# Patient Record
Sex: Male | Born: 1945 | Race: Black or African American | Hispanic: No | Smoking: Former smoker
Health system: Southern US, Community
[De-identification: ages and names within clinical notes are randomized; demographics above are authoritative.]

## PROBLEM LIST (undated history)

## (undated) DIAGNOSIS — M1990 Unspecified osteoarthritis, unspecified site: Secondary | ICD-10-CM

## (undated) DIAGNOSIS — I82409 Acute embolism and thrombosis of unspecified deep veins of unspecified lower extremity: Secondary | ICD-10-CM

## (undated) DIAGNOSIS — C9110 Chronic lymphocytic leukemia of B-cell type not having achieved remission: Secondary | ICD-10-CM

## (undated) DIAGNOSIS — I639 Cerebral infarction, unspecified: Secondary | ICD-10-CM

## (undated) DIAGNOSIS — Z85048 Personal history of other malignant neoplasm of rectum, rectosigmoid junction, and anus: Secondary | ICD-10-CM

## (undated) DIAGNOSIS — I10 Essential (primary) hypertension: Secondary | ICD-10-CM

## (undated) HISTORY — DX: Personal history of other malignant neoplasm of rectum, rectosigmoid junction, and anus: Z85.048

## (undated) HISTORY — DX: Acute embolism and thrombosis of unspecified deep veins of unspecified lower extremity: I82.409

## (undated) HISTORY — DX: Cerebral infarction, unspecified: I63.9

## (undated) HISTORY — PX: COLECTOMY: SHX59

## (undated) HISTORY — DX: Essential (primary) hypertension: I10

## (undated) HISTORY — DX: Chronic lymphocytic leukemia of B-cell type not having achieved remission: C91.10

---

## 2021-03-30 ENCOUNTER — Other Ambulatory Visit: Payer: Self-pay | Admitting: Urgent Care

## 2021-03-30 DIAGNOSIS — R600 Localized edema: Secondary | ICD-10-CM

## 2021-03-31 ENCOUNTER — Emergency Department (HOSPITAL_COMMUNITY): Payer: Medicare Other

## 2021-03-31 ENCOUNTER — Observation Stay (HOSPITAL_COMMUNITY): Payer: Medicare Other

## 2021-03-31 ENCOUNTER — Inpatient Hospital Stay (HOSPITAL_COMMUNITY)
Admission: EM | Admit: 2021-03-31 | Discharge: 2021-04-06 | DRG: 071 | Disposition: A | Discharge status: Home Health | Payer: Medicare Other | Attending: Internal Medicine | Admitting: Internal Medicine

## 2021-03-31 ENCOUNTER — Other Ambulatory Visit: Payer: Self-pay

## 2021-03-31 DIAGNOSIS — Z6825 Body mass index (BMI) 25.0-25.9, adult: Secondary | ICD-10-CM

## 2021-03-31 DIAGNOSIS — R4182 Altered mental status, unspecified: Secondary | ICD-10-CM

## 2021-03-31 DIAGNOSIS — I10 Essential (primary) hypertension: Secondary | ICD-10-CM | POA: Diagnosis present

## 2021-03-31 DIAGNOSIS — Z79899 Other long term (current) drug therapy: Secondary | ICD-10-CM

## 2021-03-31 DIAGNOSIS — E876 Hypokalemia: Secondary | ICD-10-CM | POA: Diagnosis present

## 2021-03-31 DIAGNOSIS — R627 Adult failure to thrive: Secondary | ICD-10-CM | POA: Diagnosis present

## 2021-03-31 DIAGNOSIS — Z8249 Family history of ischemic heart disease and other diseases of the circulatory system: Secondary | ICD-10-CM

## 2021-03-31 DIAGNOSIS — Z933 Colostomy status: Secondary | ICD-10-CM

## 2021-03-31 DIAGNOSIS — I69354 Hemiplegia and hemiparesis following cerebral infarction affecting left non-dominant side: Secondary | ICD-10-CM

## 2021-03-31 DIAGNOSIS — Z85048 Personal history of other malignant neoplasm of rectum, rectosigmoid junction, and anus: Secondary | ICD-10-CM

## 2021-03-31 DIAGNOSIS — Z09 Encounter for follow-up examination after completed treatment for conditions other than malignant neoplasm: Secondary | ICD-10-CM

## 2021-03-31 DIAGNOSIS — E162 Hypoglycemia, unspecified: Secondary | ICD-10-CM | POA: Diagnosis not present

## 2021-03-31 DIAGNOSIS — G9341 Metabolic encephalopathy: Principal | ICD-10-CM | POA: Diagnosis present

## 2021-03-31 DIAGNOSIS — E785 Hyperlipidemia, unspecified: Secondary | ICD-10-CM | POA: Diagnosis present

## 2021-03-31 DIAGNOSIS — E669 Obesity, unspecified: Secondary | ICD-10-CM | POA: Diagnosis present

## 2021-03-31 DIAGNOSIS — I161 Hypertensive emergency: Secondary | ICD-10-CM | POA: Diagnosis present

## 2021-03-31 DIAGNOSIS — C9110 Chronic lymphocytic leukemia of B-cell type not having achieved remission: Secondary | ICD-10-CM | POA: Diagnosis present

## 2021-03-31 DIAGNOSIS — F0150 Vascular dementia without behavioral disturbance: Secondary | ICD-10-CM | POA: Diagnosis present

## 2021-03-31 DIAGNOSIS — Z20822 Contact with and (suspected) exposure to covid-19: Secondary | ICD-10-CM | POA: Diagnosis present

## 2021-03-31 LAB — URINALYSIS, ROUTINE W REFLEX MICROSCOPIC
Bacteria, UA: NONE SEEN
Bilirubin Urine: NEGATIVE
Glucose, UA: NEGATIVE mg/dL
Ketones, ur: 5 mg/dL — AB
Leukocytes,Ua: NEGATIVE
Nitrite: NEGATIVE
Protein, ur: NEGATIVE mg/dL
Specific Gravity, Urine: 1.01 (ref 1.005–1.030)
pH: 7 (ref 5.0–8.0)

## 2021-03-31 LAB — COMPREHENSIVE METABOLIC PANEL
ALT: 20 U/L (ref 0–44)
AST: 56 U/L — ABNORMAL HIGH (ref 15–41)
Albumin: 4 g/dL (ref 3.5–5.0)
Alkaline Phosphatase: 54 U/L (ref 38–126)
Anion gap: 10 (ref 5–15)
BUN: 10 mg/dL (ref 8–23)
CO2: 26 mmol/L (ref 22–32)
Calcium: 9.1 mg/dL (ref 8.9–10.3)
Chloride: 102 mmol/L (ref 98–111)
Creatinine, Ser: 0.9 mg/dL (ref 0.61–1.24)
GFR, Estimated: >60 mL/min (ref >60)
Glucose, Bld: 71 mg/dL (ref 70–99)
Potassium: 3.8 mmol/L (ref 3.5–5.1)
Sodium: 138 mmol/L (ref 135–145)
Total Bilirubin: 2.2 mg/dL — ABNORMAL HIGH (ref 0.3–1.2)
Total Protein: 7.5 g/dL (ref 6.5–8.1)

## 2021-03-31 LAB — CBG MONITORING, ED
Glucose-Capillary: 48 mg/dL — ABNORMAL LOW (ref 70–99)
Glucose-Capillary: 76 mg/dL (ref 70–99)
Glucose-Capillary: 73 mg/dL (ref 70–99)
Glucose-Capillary: 72 mg/dL (ref 70–99)

## 2021-03-31 LAB — CBC
HCT: 38.5 % — ABNORMAL LOW (ref 39.0–52.0)
Hemoglobin: 12.7 g/dL — ABNORMAL LOW (ref 13.0–17.0)
MCH: 33.7 pg (ref 26.0–34.0)
MCHC: 33 g/dL (ref 30.0–36.0)
MCV: 102.1 fL — ABNORMAL HIGH (ref 80.0–100.0)
Platelets: 184 10*3/uL (ref 150–400)
RBC: 3.77 MIL/uL — ABNORMAL LOW (ref 4.22–5.81)
RDW: 15.6 % — ABNORMAL HIGH (ref 11.5–15.5)
WBC: 8.2 10*3/uL (ref 4.0–10.5)
nRBC: 0 % (ref 0.0–0.2)

## 2021-03-31 LAB — RESP PANEL BY RT-PCR (FLU A&B, COVID) ARPGX2
Influenza A by PCR: NEGATIVE
Influenza B by PCR: NEGATIVE
SARS Coronavirus 2 by RT PCR: NEGATIVE

## 2021-03-31 LAB — LACTIC ACID, PLASMA
Lactic Acid, Venous: 2 mmol/L (ref 0.5–1.9)
Lactic Acid, Venous: 3.5 mmol/L (ref 0.5–1.9)

## 2021-03-31 MED ORDER — ENALAPRIL MALEATE 10 MG PO TABS
20.0000 mg | ORAL_TABLET | Freq: Two times a day (BID) | ORAL | Status: DC
  Administered 2021-04-01 – 2021-04-06 (×10): 20 mg via ORAL
  Filled 2021-03-31 (×12): qty 2

## 2021-03-31 MED ORDER — LORAZEPAM 2 MG/ML IJ SOLN
0.5000 mg | Freq: Once | INTRAMUSCULAR | Status: AC
  Administered 2021-03-31: 0.5 mg via INTRAVENOUS
  Filled 2021-03-31: qty 1

## 2021-03-31 MED ORDER — LORAZEPAM 2 MG/ML IJ SOLN
1.0000 mg | Freq: Once | INTRAMUSCULAR | Status: AC
  Administered 2021-03-31: 1 mg via INTRAVENOUS
  Filled 2021-03-31: qty 1

## 2021-03-31 MED ORDER — LORAZEPAM 2 MG/ML IJ SOLN
0.5000 mg | Freq: Once | INTRAMUSCULAR | Status: AC
  Administered 2021-03-31: 0.5 mg via INTRAVENOUS

## 2021-03-31 MED ORDER — ACETAMINOPHEN 650 MG RE SUPP: 650.0000 mg | Freq: Four times a day (QID) | RECTAL | Status: DC | PRN

## 2021-03-31 MED ORDER — ONDANSETRON HCL 4 MG/2ML IJ SOLN: 4.0000 mg | Freq: Four times a day (QID) | INTRAMUSCULAR | Status: DC | PRN

## 2021-03-31 MED ORDER — HALOPERIDOL LACTATE 5 MG/ML IJ SOLN
2.0000 mg | Freq: Once | INTRAMUSCULAR | Status: AC
  Administered 2021-03-31: 2 mg via INTRAVENOUS
  Filled 2021-03-31: qty 1

## 2021-03-31 MED ORDER — MORPHINE SULFATE (PF) 2 MG/ML IV SOLN
2.0000 mg | INTRAVENOUS | Status: DC | PRN
Start: 2021-03-31 — End: 2021-04-06

## 2021-03-31 MED ORDER — LORAZEPAM 2 MG/ML IJ SOLN: 1.0000 mg | Freq: Once | INTRAMUSCULAR | Status: DC

## 2021-03-31 MED ORDER — HYDRALAZINE HCL 20 MG/ML IJ SOLN
10.0000 mg | Freq: Once | INTRAMUSCULAR | Status: AC
  Administered 2021-03-31: 10 mg via INTRAVENOUS
  Filled 2021-03-31: qty 1

## 2021-03-31 MED ORDER — LACTATED RINGERS IV BOLUS
1000.0000 mL | Freq: Once | INTRAVENOUS | Status: AC
  Administered 2021-03-31: 1000 mL via INTRAVENOUS

## 2021-03-31 MED ORDER — DEXTROSE 50 % IV SOLN
1.0000 | Freq: Once | INTRAVENOUS | Status: DC
  Filled 2021-03-31: qty 50

## 2021-03-31 MED ORDER — ONDANSETRON HCL 4 MG PO TABS: 4.0000 mg | ORAL_TABLET | Freq: Four times a day (QID) | ORAL | Status: DC | PRN

## 2021-03-31 MED ORDER — ENOXAPARIN SODIUM 40 MG/0.4ML IJ SOSY
40.0000 mg | PREFILLED_SYRINGE | INTRAMUSCULAR | Status: DC
  Administered 2021-03-31 – 2021-04-05 (×6): 40 mg via SUBCUTANEOUS
  Filled 2021-03-31 (×6): qty 0.4

## 2021-03-31 MED ORDER — HALOPERIDOL LACTATE 5 MG/ML IJ SOLN: 2.0000 mg | Freq: Four times a day (QID) | INTRAMUSCULAR | Status: DC | PRN

## 2021-03-31 MED ORDER — DEXTROSE-NACL 5-0.9 % IV SOLN: INTRAVENOUS | Status: DC

## 2021-03-31 MED ORDER — OXYCODONE HCL 5 MG PO TABS: 5.0000 mg | ORAL_TABLET | ORAL | Status: DC | PRN

## 2021-03-31 MED ORDER — PANTOPRAZOLE SODIUM 40 MG PO TBEC
40.0000 mg | DELAYED_RELEASE_TABLET | Freq: Every day | ORAL | Status: DC
  Administered 2021-04-02 – 2021-04-06 (×5): 40 mg via ORAL
  Filled 2021-03-31 (×6): qty 1

## 2021-03-31 MED ORDER — ROSUVASTATIN CALCIUM 10 MG PO TABS
20.0000 mg | ORAL_TABLET | Freq: Every day | ORAL | Status: DC
  Administered 2021-04-01 – 2021-04-05 (×5): 20 mg via ORAL
  Filled 2021-03-31 (×6): qty 2

## 2021-03-31 MED ORDER — LACTATED RINGERS IV BOLUS: 500.0000 mL | Freq: Once | INTRAVENOUS | Status: DC

## 2021-03-31 MED ORDER — ACETAMINOPHEN 325 MG PO TABS
650.0000 mg | ORAL_TABLET | Freq: Four times a day (QID) | ORAL | Status: DC | PRN
Start: 2021-03-31 — End: 2021-04-06
  Administered 2021-04-03: 650 mg via ORAL
  Filled 2021-03-31: qty 2

## 2021-03-31 NOTE — ED Triage Notes (Addendum)
Patient BIBA home, was called by sister d/t AMS and fall this morning. Sister reports no injuries or LOC. EMS reports patient is not on blood thinners. EMS states he has foul-smelling urine. 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 Sister reports he has been altered since this morning. AOx2 currently, patient is usually AOx4. Sister also reports he has not had any stool his colostomy since yesterday.  300 cc NS given   VS: BP 191/118, CBG 101, HR 76, RR 20

## 2021-03-31 NOTE — ED Provider Notes (Signed)
[facility] DEPT Provider Note   CSN: [id number] Arrival date & time: 03/31/21  0946     History Chief Complaint  Patient presents with   Altered Mental Status    Aaron Gonzalez is a 75 y.o. male.  HPI  Patient is a 75 year old male who was brought in by EMS due to altered mental status.  His sister is at bedside and provides patient's history.  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  She states that he has chronic left-sided deficits as well as mild dementia since this occurred.  After he left the hospital he moved to [place] from [place] in May 2022 to live with his family.  He typically ambulates with a walker.  She states about 1 week ago he was still ambulating with a walker but appeared slightly weaker than normal.  She states that yesterday her other sister found him on the floor of their home with items scattered around him as if he had fell to the floor.  He denied a fall when they asked and told her he was tired and was resting on the floor.  His sisters also note that he had a decreased appetite all day yesterday but was eating normally the day prior.  Early this morning his sister woke up in the middle of the night when the alarm went off and she found him trying to wander out of the house without his walker.  He appeared more confused than normal so she then called EMS.  They do note that recently he has had more foul-smelling urine.  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  They state that yesterday he had decreased output in the colostomy as well.  They state he is typically A&O x4.   Level 5 caveat due to altered mental status     No past medical history on file.  There are no problems to display for this patient.   No family history on file.     Home Medications Prior to Admission medications   Medication  Sig Start Date End Date Taking? Authorizing Provider  allopurinol (ZYLOPRIM) 300 MG tablet Take 300 mg by mouth daily.   Yes [provider]  enalapril (VASOTEC) 20 MG tablet Take 20 mg by mouth 2 (two) times daily.   Yes [provider]  ibrutinib (IMBRUVICA) 420 MG TABS Take 420 mg by mouth daily.   Yes [provider]  pantoprazole (PROTONIX) 40 MG tablet Take 40 mg by mouth daily.   Yes [provider]  rosuvastatin (CRESTOR) 20 MG tablet Take 20 mg by mouth at bedtime.   Yes [provider]  sulfamethoxazole-trimethoprim (BACTRIM DS) 800-160 MG tablet Take 1 tablet by mouth every Monday, Wednesday, and Friday.   Yes [provider]  valACYclovir (VALTREX) 500 MG tablet Take 500 mg by mouth every 12 (twelve) hours.   Yes [provider]    Allergies    Patient has no known allergies.  Review of Systems   Review of Systems  Unable to perform ROS: Mental status change   Physical Exam Updated Vital Signs BP (!) 160/107   Pulse 88   Temp 98.1 F (36.7 C) (Oral)   Resp 18   SpO2 94%   Physical Exam Vitals and nursing note reviewed.  Constitutional:      General:  He is in acute distress.     Appearance: Normal appearance. He is normal weight. He is not ill-appearing, toxic-appearing or diaphoretic.     Comments: Patient lying in the recumbent position.  Tearful during my exam.  Does not provide clear answers to questions.  Has difficulty following commands.  HENT:     Head: Normocephalic and atraumatic.     Right Ear: External ear normal.     Left Ear: External ear normal.     Nose: Nose normal.     Mouth/Throat:     Mouth: Mucous membranes are moist.     Pharynx: Oropharynx is clear. No oropharyngeal exudate or posterior oropharyngeal erythema.  Eyes:     Extraocular Movements: Extraocular movements intact.  Cardiovascular:     Rate and Rhythm: Normal rate and regular rhythm.     Pulses: Normal pulses.      Heart sounds: Normal heart sounds. No murmur heard.   No friction rub. No gallop.  Pulmonary:     Effort: Pulmonary effort is normal. No respiratory distress.     Breath sounds: Normal breath sounds. No stridor. No wheezing, rhonchi or rales.  Abdominal:     General: Abdomen is flat.     Palpations: Abdomen is soft.     Tenderness: There is no abdominal tenderness.     Comments: Colostomy in place in the left abdomen.  No surrounding erythema.  Abdomen is flat and soft.  Musculoskeletal:        General: Normal range of motion.     Cervical back: Normal range of motion and neck supple. No tenderness.  Skin:    General: Skin is warm and dry.  Neurological:     Comments: Oriented to self.  Appears to be able to move all 4 extremities.  At times does not provide clear answers to questions.  States he is currently in [place].  Unsure of the president.  Unsure of the year.  Psychiatric:        Mood and Affect: Mood normal.        Behavior: Behavior normal.    ED Results / Procedures / Treatments   Labs (all labs ordered are listed, but only abnormal results are displayed) Labs Reviewed  COMPREHENSIVE METABOLIC PANEL - Abnormal; Notable for the following components:      Result Value   AST 56 (*)    Total Bilirubin 2.2 (*)    All other components within normal limits  CBC - Abnormal; Notable for the following components:   RBC 3.77 (*)    Hemoglobin 12.7 (*)    HCT 38.5 (*)    MCV 102.1 (*)    RDW 15.6 (*)    All other components within normal limits  URINALYSIS, ROUTINE W REFLEX MICROSCOPIC - Abnormal; Notable for the following components:   Color, Urine STRAW (*)    Hgb urine dipstick SMALL (*)    Ketones, ur 5 (*)    All other components within normal limits  LACTIC ACID, PLASMA - Abnormal; Notable for the following components:   Lactic Acid, Venous 2.0 (*)    All other components within normal limits  LACTIC ACID, PLASMA - Abnormal; Notable for the following components:    Lactic Acid, Venous 3.5 (*)    All other components within normal limits  CBG MONITORING, ED - Abnormal; Notable for the following components:   Glucose-Capillary 48 (*)    All other components within normal limits  RESP PANEL BY RT-PCR (FLU A&B,  COVID) ARPGX2  URINE CULTURE  LACTIC ACID, PLASMA  CBG MONITORING, ED  CBG MONITORING, ED  CBG MONITORING, ED   EKG None  Radiology CT Head Wo Contrast  Result Date: 03/31/2021 CLINICAL DATA:  Altered mental status following a fall this morning. EXAM: CT HEAD WITHOUT CONTRAST TECHNIQUE: Contiguous axial images were obtained from the base of the skull through the vertex without intravenous contrast. COMPARISON:  None. FINDINGS: Brain: Mildly enlarged ventricles and cortical sulci. Mild patchy white matter low density in both cerebral hemispheres. No intracranial hemorrhage, mass lesion or CT evidence of acute infarction. Vascular: No hyperdense vessel or unexpected calcification. Skull: Normal. Negative for fracture or focal lesion. Sinuses/Orbits: Completely opacified right maxillary sinus with extensive mucosal thickening in the right ethmoid and frontal sinuses. Unremarkable orbits. Other: None. IMPRESSION: 1. No acute abnormality. 2. Mild diffuse cerebral atrophy. 3. Mild chronic small vessel white matter ischemic changes in both cerebral hemispheres. 4. Extensive chronic right maxillary, ethmoid and frontal sinusitis. Electronically Signed   By: Claudie Revering M.D.   On: 03/31/2021 13:33   DG Chest Portable 1 View  Result Date: 03/31/2021 CLINICAL DATA:  Altered mental status. EXAM: PORTABLE CHEST 1 VIEW COMPARISON:  None. FINDINGS: Numerous leads and wires project over the chest. Mild right hemidiaphragm elevation. Midline trachea. Normal heart size. Mildly prominent transverse aorta. No pleural effusion or pneumothorax. Clear lungs. IMPRESSION: No acute cardiopulmonary disease. Mildly prominent transverse aorta, possibly related AP portable  technique. Consider nonemergent follow-up with PA and lateral radiographs. Electronically Signed   By: Abigail Miyamoto M.D.   On: 03/31/2021 11:49    Procedures Procedures   Medications Ordered in ED Medications  LORazepam (ATIVAN) injection 1 mg (1 mg Intravenous Given 03/31/21 1224)  lactated ringers bolus 1,000 mL (0 mLs Intravenous Stopped 03/31/21 1537)  lactated ringers bolus 1,000 mL (1,000 mLs Intravenous Bolus 03/31/21 1620)  LORazepam (ATIVAN) injection 0.5 mg (0.5 mg Intravenous Given 03/31/21 1651)  LORazepam (ATIVAN) injection 0.5 mg (0.5 mg Intravenous Given 03/31/21 1704)  haloperidol lactate (HALDOL) injection 2 mg (2 mg Intravenous Given 03/31/21 1801)    ED Course  I have reviewed the triage vital signs and the nursing notes.  Pertinent labs & imaging results that were available during my care of the patient were reviewed by me and considered in my medical decision making (see chart for details).  Clinical Course as of 03/31/21 1811  Sat Mar 31, 2021  1248 Found to be hypoglycemic upon arrival.  Given juice and crackers with a repeat CBG of 76. [LJ]  1309 Lactic Acid, Venous(!!): 2.0 [LJ]  1339 Patient reassessed.  Receiving IV fluids.  His sisters are giving him his home medications.  Currently sleeping in bed. [LJ]  1601 Lactic Acid, Venous(!!): 3.5 Repeat lactic obtained after receiving 1 L of IV fluids.  We will give patient an additional liter of fluids.  Pending MRI of the brain.  We will also obtain a CT scan of the abdomen and pelvis. [LJ]  3818 Reviewed patient's medications with his sisters in length.  They are unsure why he is on Valtrex or Bactrim.  He was started on both these medications after his most recent admission at [facility].  Discussed with receptionist who will attempt to obtain his medical records. [LJ]  2993 Pt awake and not complaint with MRI. Given additional ativan. [LJ]  1723 Pt still not sitting still with additional ativan. Will attempt  CT abd/pelvis at this time.  [LJ]  1746  Pt discussed with the medicine team. They would prefer patient have his scans completed before they accept him for admission. Requested we check an additional lactic acid.  [LJ]    Clinical Course User Index [LJ] Rayna Sexton, PA-C   MDM Rules/Calculators/A&P                          Pt is a 75 y.o. male who presents to the emergency department due to altered mental status.  Labs: CBC with a hemoglobin of 12.7, MCV of 102.1, RDW of 15.6. CMP within AST of 56 and a total bilirubin of 2.2. Respiratory panel is negative. UA showing small hemoglobin and 5 ketones. Elevated lactic acid of 2 with a repeat of 3.5.  Repeat lactic acid was obtained after 1 L of IV fluids.  Patient started on a second liter of IV fluids. Patient initially hypoglycemic at 48.  3 repeat CBG's are all within normal limits.  Imaging: CT scan of the head without contrast shows no acute abnormality.  There is mild diffuse cerebral atrophy.  Mild chronic small vessel white matter ischemic changes in both cerebral hemispheres.  Extensive chronic right maxillary, ethmoid, and frontal sinusitis. Chest x-ray shows no acute cardiopulmonary disease.  Mildly prominent transverse aorta. MRI of the brain and CT scan of the abdomen and pelvis without contrast is pending.  I, Rayna Sexton, PA-C, personally reviewed and evaluated these images and lab results as part of my medical decision-making.  Unsure of the source of patient's altered mental status.  Lab work is mostly reassuring but patient did have a mildly elevated lactic acid at 2.  After a 1 L bolus a repeat lactic acid was obtained and was elevated once again at 3.5.  Additional fluids were started and patient given additional Ativan while in MRI but would not sit still for MRI at this time.    Discussed with the medicine team and they requested that CT scan of the abdomen as well as MRI of the brain be completed before  admission.  Will give patient additional Haldol prior to obtaining MRI.  It is in my shift and patient care is being transferred to [facility].  Patient pending CT scan of the abdomen and pelvis as well as MRI of the brain.  We will obtain a third lactic acid at the request of the medicine team.  We will give additional IV fluids.  Patient will likely require admission for AMS and hypoglycemia after his imaging is obtained.   Note: Portions of this report may have been transcribed using voice recognition software. Every effort was made to ensure accuracy; however, inadvertent computerized transcription errors may be present.   Final Clinical Impression(s) / ED Diagnoses Final diagnoses:  Altered mental status, unspecified altered mental status type  Hypoglycemia   Rx / DC Orders ED Discharge Orders     None        Rayna Sexton, PA-C 03/31/21 1812    Daleen Bo, MD 04/01/21 1526

## 2021-03-31 NOTE — ED Provider Notes (Addendum)
  Face-to-face evaluation   History: Patient presents for evaluation of altered mental status and sleepiness for the last 36 hours.  He recently moved to [facility] to be with his sisters to recover from stroke.  He is walking with a walker.  He has had some periods of time in the last 36 hours where he is more sleepy than usual, sometimes more and sometimes less active than usual.  Family members are concerned about the acute confusion.  He did not eat today.  They called EMS to bring him here because of the persistent difficulty.  Physical exam: Elderly patient.  He is sedated, after receiving Ativan.  Abdomen is soft without mass or signs of tenderness.  No respiratory distress  Medical screening examination/treatment/procedure(s) were conducted as a shared visit with non-physician practitioner(s) and myself.  I personally evaluated the patient during the encounter      Daleen Bo, MD 04/01/21 1525

## 2021-03-31 NOTE — ED Notes (Signed)
Patient given orange juice due to low blood sugar.

## 2021-03-31 NOTE — H&P (Signed)
TRH H&P    Patient Demographics:    Aaron Gonzalez, is a 75 y.o. male  MRN: [id number]  DOB - 01-23-1946  Admit Date - 03/31/2021  Referring MD/NP/PA: Merrily Pew  Outpatient Primary MD for the patient is Pcp, No  Patient coming from: Home  Chief complaint- altered mental status   HPI:    Aaron Gonzalez  is a 75 y.o. male, with history of cancer, hypertension, hyperlipidemia, dementia, presents to the hospital with a chief complaint of altered mental status.  Patient was quite agitated and had to be sedated for MRI, so at this time he is not able to provide any history at all.  His niece and his sister are at bedside.  They report that he possibly had a fall yesterday.  He denied having a fall, but in his room furniture was turned over and awry, so they assume he did have a fall.  Later that day patient refused to take a shower or get dressed.  This is abnormal for him as he is usually independent with his ADLs.  His sister had to change his close for him.  They report that he became more altered throughout the day yesterday, and today he wandered out of the home on his own.  He was wearing soiled clothes, and without his walker which which she always uses for ambulation.  They are able to get him back and he was very confused, this is said to call EMS.  The sister at bedside is not the sister he lives with, so history is limited.  She does report that he has had a decreased appetite for the past couple of days.  The niece at bedside last saw him on the seventh and he was at his baseline.  They deny any cough nausea or vomiting.  He wears a colostomy bag and they report no output in the bag today.  He has not complained of anything else to them.  Of note medical records are limited as well as patient was previously in [place] and only recently moved to [place] after having a stroke with residual left-sided weakness, and being  diagnosed with dementia.  He moved here to live close to family.  In the ED Temp 98.1, heart rate 78-95, respiratory rate 15, blood pressure 204/118, satting at 98% UA is not indicative of UTI Urine culture pending Respiratory panel negative No leukocytosis with a white blood cell count of 8.2, hemoglobin 12.7 Chemistry panel reveals a glucose of 71, T bili of 2.2 Lactic acid uptrending from 2.0-3.5, 2 L bolus given, repeat lactic pending EKG shows a heart rate of 85, sinus rhythm, QTC 433 Chest x-ray shows nothing acute with nonemergent PA and lateral exam recommended for follow-up of a nonspecific finding CT head shows no acute abnormality, mild diffuse cerebral atrophy, chronic ischemic changes CT abdomen pelvis shows no gross acute abnormality MRI brain shows motion artifact, remote right basal ganglia hemorrhagic infarct Admission requested for further work-up of acute metabolic encephalopathy    Review of systems:  unfortunately review of systems cannot be obtained secondary to patient's altered mental status    Past History of the following :      Has history of hypertension, hyperlipidemia, dementia, CVA in March 2022    Social History:      Social History   Tobacco Use   Smoking status: Not on file   Smokeless tobacco: Not on file  Substance Use Topics   Alcohol use: Not on file       Family History :     Family history hypertension   Home Medications:   Prior to Admission medications   Medication Sig Start Date End Date Taking? Authorizing Provider  allopurinol (ZYLOPRIM) 300 MG tablet Take 300 mg by mouth daily.   Yes [provider]  enalapril (VASOTEC) 20 MG tablet Take 20 mg by mouth 2 (two) times daily.   Yes [provider]  ibrutinib (IMBRUVICA) 420 MG TABS Take 420 mg by mouth daily.   Yes [provider]  pantoprazole (PROTONIX) 40 MG tablet Take 40 mg by mouth daily.   Yes [provider]  rosuvastatin  (CRESTOR) 20 MG tablet Take 20 mg by mouth at bedtime.   Yes [provider]  sulfamethoxazole-trimethoprim (BACTRIM DS) 800-160 MG tablet Take 1 tablet by mouth every Monday, Wednesday, and Friday.   Yes [provider]  valACYclovir (VALTREX) 500 MG tablet Take 500 mg by mouth every 12 (twelve) hours.   Yes [provider]     Allergies:    No Known Allergies   Physical Exam:   Vitals  Blood pressure (!) 159/103, pulse 85, temperature 98.1 F (36.7 C), temperature source Oral, resp. rate 18, SpO2 97 %.  1.  General: Patient lying supine in bed,  no acute distress   2. Psychiatric: Sedated and unable to participate in psych assessment   3. Neurologic: Sedated and not following commands, face is symmetric, airway is protected, purposeful movements of both upper extremities, withdraws from pain in both lower extremities   4. HEENMT:  Head is atraumatic, normocephalic, pupils reactive to light, neck is supple, trachea is midline, mucous membranes are moist   5. Respiratory : Lungs are clear to auscultation bilaterally without wheezing, rhonchi, rales, no cyanosis, no increase in work of breathing or accessory muscle use   6. Cardiovascular : Heart rate normal, rhythm is regular, no murmurs, rubs or gallops, unilateral chronic left lower extremity edema-trace, peripheral pulses palpated   7. Gastrointestinal:  Abdomen is soft, nondistended, nontender to palpation bowel sounds active, no masses or organomegaly palpated, colostomy bag is filled with gas   8. Skin:  Skin is warm, dry and intact without rashes, acute lesions, or ulcers on limited exam   9.Musculoskeletal:  No acute deformities or trauma, no asymmetry in tone, trace peripheral edema no lower extremity left, peripheral pulses palpated, no tenderness to palpation in the extremities     Data Review:    CBC Recent Labs  Lab 03/31/21 1049  WBC 8.2  HGB 12.7*  HCT 38.5*  PLT 184   MCV 102.1*  MCH 33.7  MCHC 33.0  RDW 15.6*   ------------------------------------------------------------------------------------------------------------------  Results for orders placed or performed during the hospital encounter of 03/31/21 (from the past 48 hour(s))  Comprehensive metabolic panel     Status: Abnormal   Collection Time: 03/31/21 10:49 AM  Result Value Ref Range   Sodium 138 135 - 145 mmol/L   Potassium 3.8 3.5 - 5.1 mmol/L   Chloride  102 98 - 111 mmol/L   CO2 26 22 - 32 mmol/L   Glucose, Bld 71 70 - 99 mg/dL    Comment: Glucose reference range applies only to samples taken after fasting for at least 8 hours.   BUN 10 8 - 23 mg/dL   Creatinine, Ser 0.90 0.61 - 1.24 mg/dL   Calcium 9.1 8.9 - 10.3 mg/dL   Total Protein 7.5 6.5 - 8.1 g/dL   Albumin 4.0 3.5 - 5.0 g/dL   AST 56 (H) 15 - 41 U/L   ALT 20 0 - 44 U/L   Alkaline Phosphatase 54 38 - 126 U/L   Total Bilirubin 2.2 (H) 0.3 - 1.2 mg/dL   GFR, Estimated >60 >60 mL/min    Comment: (NOTE) Calculated using the CKD-EPI Creatinine Equation (2021)    Anion gap 10 5 - 15    Comment: Performed at [facility], [place] [address] [place], [place] [address]  CBC     Status: Abnormal   Collection Time: 03/31/21 10:49 AM  Result Value Ref Range   WBC 8.2 4.0 - 10.5 K/uL   RBC 3.77 (L) 4.22 - 5.81 MIL/uL   Hemoglobin 12.7 (L) 13.0 - 17.0 g/dL   HCT 38.5 (L) 39.0 - 52.0 %   MCV 102.1 (H) 80.0 - 100.0 fL   MCH 33.7 26.0 - 34.0 pg   MCHC 33.0 30.0 - 36.0 g/dL   RDW 15.6 (H) 11.5 - 15.5 %   Platelets 184 150 - 400 K/uL   nRBC 0.0 0.0 - 0.2 %    Comment: Performed at [facility], [place] [address] [place], [place] [address]  Resp Panel by RT-PCR (Flu A&B, Covid) Nasopharyngeal Swab     Status: None   Collection Time: 03/31/21 10:50 AM   Specimen: Nasopharyngeal Swab; Nasopharyngeal(NP) swabs in vial transport medium  Result Value Ref Range   SARS Coronavirus 2 by RT PCR NEGATIVE  NEGATIVE    Comment: (NOTE) SARS-CoV-2 target nucleic acids are NOT DETECTED.  The SARS-CoV-2 RNA is generally detectable in upper respiratory specimens during the acute phase of infection. The lowest concentration of SARS-CoV-2 viral copies this assay can detect is 138 copies/mL. A negative result does not preclude SARS-Cov-2 infection and should not be used as the sole basis for treatment or other patient management decisions. A negative result may occur with  improper specimen collection/handling, submission of specimen other than nasopharyngeal swab, presence of viral mutation(s) within the areas targeted by this assay, and inadequate number of viral copies(<138 copies/mL). A negative result must be combined with clinical observations, patient history, and epidemiological information. The expected result is Negative.  Fact Sheet for Patients:  EntrepreneurPulse.com.au  Fact Sheet for Healthcare Providers:  IncredibleEmployment.be  This test is no t yet approved or cleared by the [place] FDA and  has been authorized for detection and/or diagnosis of SARS-CoV-2 by FDA under an Emergency Use Authorization (EUA). This EUA will remain  in effect (meaning this test can be used) for the duration of the COVID-19 declaration under Section 564(b)(1) of the Act, 21 U.S.C.section 360bbb-3(b)(1), unless the authorization is terminated  or revoked sooner.       Influenza A by PCR NEGATIVE NEGATIVE   Influenza B by PCR NEGATIVE NEGATIVE    Comment: (NOTE) The Xpert Xpress SARS-CoV-2/FLU/RSV plus assay is intended as an aid in the diagnosis of influenza from Nasopharyngeal swab specimens and should not be used as a sole basis for treatment.  Nasal washings and aspirates are unacceptable for Xpert Xpress SARS-CoV-2/FLU/RSV testing.  Fact Sheet for Patients: EntrepreneurPulse.com.au  Fact Sheet for Healthcare  Providers: IncredibleEmployment.be  This test is not yet approved or cleared by the [place] FDA and has been authorized for detection and/or diagnosis of SARS-CoV-2 by FDA under an Emergency Use Authorization (EUA). This EUA will remain in effect (meaning this test can be used) for the duration of the COVID-19 declaration under Section 564(b)(1) of the Act, 21 U.S.C. section 360bbb-3(b)(1), unless the authorization is terminated or revoked.  Performed at [facility], [place] [address], [place], [place] [address]   Urinalysis, Routine w reflex microscopic Urine, Clean Catch     Status: Abnormal   Collection Time: 03/31/21 10:51 AM  Result Value Ref Range   Color, Urine STRAW (A) YELLOW   APPearance CLEAR CLEAR   Specific Gravity, Urine 1.010 1.005 - 1.030   pH 7.0 5.0 - 8.0   Glucose, UA NEGATIVE NEGATIVE mg/dL   Hgb urine dipstick SMALL (A) NEGATIVE   Bilirubin Urine NEGATIVE NEGATIVE   Ketones, ur 5 (A) NEGATIVE mg/dL   Protein, ur NEGATIVE NEGATIVE mg/dL   Nitrite NEGATIVE NEGATIVE   Leukocytes,Ua NEGATIVE NEGATIVE   RBC / HPF 0-5 0 - 5 RBC/hpf   WBC, UA 0-5 0 - 5 WBC/hpf   Bacteria, UA NONE SEEN NONE SEEN   Squamous Epithelial / LPF 0-5 0 - 5    Comment: Performed at [facility], [place] [address], [place], [place] [address]  Lactic acid, plasma     Status: Abnormal   Collection Time: 03/31/21 10:51 AM  Result Value Ref Range   Lactic Acid, Venous 2.0 (HH) 0.5 - 1.9 mmol/L    Comment: CRITICAL RESULT CALLED TO, READ BACK BY AND VERIFIED WITHEsmond Camper RN AT 1308 03/31/21 MULLINS,T Performed at [facility], [place] [address] [place], [place] [address]   CBG monitoring, ED     Status: Abnormal   Collection Time: 03/31/21 11:53 AM  Result Value Ref Range   Glucose-Capillary 48 (L) 70 - 99 mg/dL    Comment: Glucose reference range applies only to samples taken after fasting for at least 8 hours.   CBG monitoring, ED     Status: None   Collection Time: 03/31/21 12:33 PM  Result Value Ref Range   Glucose-Capillary 76 70 - 99 mg/dL    Comment: Glucose reference range applies only to samples taken after fasting for at least 8 hours.  Lactic acid, plasma     Status: Abnormal   Collection Time: 03/31/21  2:37 PM  Result Value Ref Range   Lactic Acid, Venous 3.5 (HH) 0.5 - 1.9 mmol/L    Comment: CRITICAL VALUE NOTED.  VALUE IS CONSISTENT WITH PREVIOUSLY REPORTED AND CALLED VALUE. Performed at [facility], [place] [address], [place], [place] [address]   POC CBG, ED     Status: None   Collection Time: 03/31/21  2:57 PM  Result Value Ref Range   Glucose-Capillary 73 70 - 99 mg/dL    Comment: Glucose reference range applies only to samples taken after fasting for at least 8 hours.  POC CBG, ED     Status: None   Collection Time: 03/31/21  4:19 PM  Result Value Ref Range   Glucose-Capillary 72 70 - 99 mg/dL    Comment: Glucose reference range applies only to samples taken after fasting for at least 8 hours.    Chemistries  Recent Labs  Lab 03/31/21 1049  NA 138  K 3.8  CL 102  CO2 26  GLUCOSE 71  BUN 10  CREATININE 0.90  CALCIUM 9.1  AST 56*  ALT 20  ALKPHOS 54  BILITOT 2.2*   ------------------------------------------------------------------------------------------------------------------  ------------------------------------------------------------------------------------------------------------------ GFR: CrCl cannot be calculated (Unknown ideal weight.). Liver Function Tests: Recent Labs  Lab 03/31/21 1049  AST 56*  ALT 20  ALKPHOS 54  BILITOT 2.2*  PROT 7.5  ALBUMIN 4.0   No results for input(s): LIPASE, AMYLASE in the last 168 hours. No results for input(s): AMMONIA in the last 168 hours. Coagulation Profile: No results for input(s): INR, PROTIME in the last 168 hours. Cardiac Enzymes: No results for input(s): CKTOTAL, CKMB, CKMBINDEX,  TROPONINI in the last 168 hours. BNP (last 3 results) No results for input(s): PROBNP in the last 8760 hours. HbA1C: No results for input(s): HGBA1C in the last 72 hours. CBG: Recent Labs  Lab 03/31/21 1153 03/31/21 1233 03/31/21 1457 03/31/21 1619  GLUCAP 48* 76 73 72   Lipid Profile: No results for input(s): CHOL, HDL, LDLCALC, TRIG, CHOLHDL, LDLDIRECT in the last 72 hours. Thyroid Function Tests: No results for input(s): TSH, T4TOTAL, FREET4, T3FREE, THYROIDAB in the last 72 hours. Anemia Panel: No results for input(s): VITAMINB12, FOLATE, FERRITIN, TIBC, IRON, RETICCTPCT in the last 72 hours.  --------------------------------------------------------------------------------------------------------------- Urine analysis:    Component Value Date/Time   COLORURINE STRAW (A) 03/31/2021 1051   APPEARANCEUR CLEAR 03/31/2021 1051   LABSPEC 1.010 03/31/2021 1051   PHURINE 7.0 03/31/2021 1051   GLUCOSEU NEGATIVE 03/31/2021 1051   HGBUR SMALL (A) 03/31/2021 1051   BILIRUBINUR NEGATIVE 03/31/2021 1051   KETONESUR 5 (A) 03/31/2021 1051   PROTEINUR NEGATIVE 03/31/2021 1051   NITRITE NEGATIVE 03/31/2021 1051   LEUKOCYTESUR NEGATIVE 03/31/2021 1051      Imaging Results:    CT ABDOMEN PELVIS WO CONTRAST  Result Date: 03/31/2021 CLINICAL DATA:  Acute, non localized abdominal pain. EXAM: CT ABDOMEN AND PELVIS WITHOUT CONTRAST TECHNIQUE: Multidetector CT imaging of the abdomen and pelvis was performed following the standard protocol without IV contrast. COMPARISON:  None. FINDINGS: Lower chest: Mildly enlarged heart. Minimal bibasilar atelectasis/scarring. Hepatobiliary: Normal appearing liver. Poorly distended gallbladder. Pancreas: Unremarkable. No pancreatic ductal dilatation or surrounding inflammatory changes. Spleen: Normal in size without focal abnormality. Adrenals/Urinary Tract: The adrenal glands are not well visualized due to streak artifacts produced by the patient's arms  cysts. No gross adrenal abnormalities are seen. Multiple probable bilateral renal cysts, difficult to assess due to the streak artifacts. Tiny lower pole right renal calculus. Tiny upper pole left renal calculus. No bladder or ureteral calculi seen. Portions of the bladder and distal ureters are obscured by streak artifacts from a right prosthesis. Stomach/Bowel: Small hiatal hernia. Single proximal sigmoid colon diverticulum. No visible small bowel abnormalities. No findings suspicious for appendicitis. Vascular/Lymphatic: No significant vascular findings are present. No enlarged abdominal or pelvic lymph nodes. Reproductive: Probable moderately enlarged prostate gland, poorly visualized due to streak artifacts. Other: Bilateral hydroceles.  Left lower quadrant ostomy. Musculoskeletal: Right hip prosthesis with associated streak artifacts. Marked left hip degenerative changes. Moderate thoracolumbar scoliosis and extensive degenerative changes. IMPRESSION: 1. Limited examination with no gross acute abnormality. 2. Tiny bilateral, nonobstructing renal calculi. 3. Small hiatal hernia. Electronically Signed   By: Claudie Revering M.D.   On: 03/31/2021 18:15   CT Head Wo Contrast  Result Date: 03/31/2021 CLINICAL DATA:  Altered mental status following a fall this morning. EXAM: CT  HEAD WITHOUT CONTRAST TECHNIQUE: Contiguous axial images were obtained from the base of the skull through the vertex without intravenous contrast. COMPARISON:  None. FINDINGS: Brain: Mildly enlarged ventricles and cortical sulci. Mild patchy white matter low density in both cerebral hemispheres. No intracranial hemorrhage, mass lesion or CT evidence of acute infarction. Vascular: No hyperdense vessel or unexpected calcification. Skull: Normal. Negative for fracture or focal lesion. Sinuses/Orbits: Completely opacified right maxillary sinus with extensive mucosal thickening in the right ethmoid and frontal sinuses. Unremarkable orbits. Other:  None. IMPRESSION: 1. No acute abnormality. 2. Mild diffuse cerebral atrophy. 3. Mild chronic small vessel white matter ischemic changes in both cerebral hemispheres. 4. Extensive chronic right maxillary, ethmoid and frontal sinusitis. Electronically Signed   By: Claudie Revering M.D.   On: 03/31/2021 13:33   MR BRAIN WO CONTRAST  Result Date: 03/31/2021 CLINICAL DATA:  Initial evaluation for delirium. EXAM: MRI HEAD WITHOUT CONTRAST TECHNIQUE: Multiplanar, multiecho pulse sequences of the brain and surrounding structures were obtained without intravenous contrast. COMPARISON:  Prior CT from earlier the same day. FINDINGS: Brain: Examination technically limited by extensive motion artifact, markedly degrading multiple sequences. Generalized age-related cerebral atrophy. Scattered patchy T2/FLAIR hyperintensity within the periventricular deep white matter both cerebral hemispheres as well as the pons, most consistent with chronic small vessel ischemic disease, mild to moderate in nature. Remote right basal ganglia infarct with prominent susceptibility artifact, consistent with a chronic hemorrhagic infarct (series 26, image 33). No acute hemorrhage seen at this location on prior CT. No abnormal foci of restricted diffusion to suggest acute or subacute ischemia. Gray-white matter differentiation maintained. No encephalomalacia to suggest chronic cortical infarction. No other acute or chronic intracranial hemorrhage. No mass lesion, midline shift or mass effect. Ventricles normal size without hydrocephalus. No extra-axial fluid collection. Pituitary gland suprasellar region within normal limits. Midline structures intact. Vascular: Major intracranial vascular flow voids are grossly maintained at the skull base. Skull and upper cervical spine: Craniocervical junction within normal limits. Bone marrow signal intensity normal. No focal marrow replacing lesion. No scalp soft tissue abnormality. Sinuses/Orbits: Globes and  orbital soft tissues demonstrate no acute finding. Chronic right frontoethmoidal and maxillary sinusitis noted. Trace left mastoid effusion, of doubtful significance. Other: None. IMPRESSION: 1. Technically limited exam due to extensive motion artifact. 2. No acute intracranial abnormality. 3. Remote right basal ganglia hemorrhagic infarct. 4. Underlying moderate chronic microvascular ischemic disease. 5. Chronic right sided paranasal sinus disease. Electronically Signed   By: Jeannine Boga M.D.   On: 03/31/2021 20:02   DG Chest Portable 1 View  Result Date: 03/31/2021 CLINICAL DATA:  Altered mental status. EXAM: PORTABLE CHEST 1 VIEW COMPARISON:  None. FINDINGS: Numerous leads and wires project over the chest. Mild right hemidiaphragm elevation. Midline trachea. Normal heart size. Mildly prominent transverse aorta. No pleural effusion or pneumothorax. Clear lungs. IMPRESSION: No acute cardiopulmonary disease. Mildly prominent transverse aorta, possibly related AP portable technique. Consider nonemergent follow-up with PA and lateral radiographs. Electronically Signed   By: Abigail Miyamoto M.D.   On: 03/31/2021 11:49       Assessment & Plan:    Active Problems:   Acute metabolic encephalopathy   Acute metabolic encephalopathy Differential is broad Hypoglycemia could be contributing as glucose was initially 48, corrected to 70s after orange juice, with no improvement in mentation No sign of infection on CT abdomen pelvis, chest x-ray, or UA, urine culture pending Not likely to be withdrawal as patient does not have any opiate medications listed, no known  history of alcoholism or drug use UDS pending, Tylenol level pending, salicylate level pending Patient possibly had a fall yesterday-denied to family, no evidence of trauma on exam, head CT normal Not likely to be due to CNS pathology with no acute changes on CT head or MRI Deficiencies are possible as patient does have macrocytic anemia,  checking B12, folate, thiamine TSH pending Neurochecks every 2 hours x12 hours Holding Bactrim on med rec last dose was June 10 Continue to monitor Hypoglycemia Check glucose every 2 hours D5 normal saline at 75 mils per hour Continue to monitor Lactic acidosis Initially 2.0 and then increased to 3.5 2 L bolus in the ED Continue gentle IV hydration Trending lactic acid pending Macrocytic anemia Check B12 and folate as above No overt signs of bleeding Trend in the a.m. Elevated T bili T bili of 2.2 CT abdomen pelvis does not show gallbladder or liver pathology Check ammonia AST only minimally elevated at 56 Continue to monitor Hypertension Continue home meds and patient is alert enough to take p.o. meds For tonight one-time dose of hydralazine Blood pressure currently 200s over 100s Hyperlipidemia Continue Crestor Dementia Continue reorientation when possible, apply mittens so patient is less likely to interfere with his medical treatment Patient received 2 mg of Haldol, 2 mg of Ativan in the ED Continue as needed Haldol every 6 hours Continue to monitor    DVT Prophylaxis-   Lovenox - SCDs   AM Labs Ordered, also please review Full Orders  Family Communication: Admission, patients condition and plan of care including tests being ordered have been discussed with the patient, niece, and sister who indicate understanding and agree with the plan and Code Status.  Code Status:  full  Admission status: Observation   Eiko Mcgowen B Zierle-Ghosh DO

## 2021-03-31 NOTE — ED Notes (Signed)
This RN with patient in MRI. Patient continues to be disoriented and unable to lie still for scan despite medications. Rolla Plate, [place] notified

## 2021-03-31 NOTE — ED Provider Notes (Signed)
8:54 PM Signout from previous provider at shift change.   Patient was awaiting completion of MRI.  Fortunately MRI and CT of the abdomen pelvis did not show any acute findings.  I went and visited with the patient who is sleepy and sedated after receiving IV meds.  He is arousable.  Confirmed history from the patient's sister who is at bedside.  I spoke with [facility] hospitalist who will see for admission.  Continuing to wait third lactate.  RN was unable to obtain more blood.  Was waiting until IV fluids completed to try again.  BP (!) 159/103   Pulse 85   Temp 98.1 F (36.7 C) (Oral)   Resp 18   SpO2 97%     Carlisle Cater, PA-C 03/31/21 2056    Blanchie Dessert, MD 04/01/21 [phone]

## 2021-04-01 ENCOUNTER — Encounter (HOSPITAL_COMMUNITY): Payer: Self-pay | Admitting: Family Medicine

## 2021-04-01 DIAGNOSIS — F0150 Vascular dementia without behavioral disturbance: Secondary | ICD-10-CM

## 2021-04-01 DIAGNOSIS — I69354 Hemiplegia and hemiparesis following cerebral infarction affecting left non-dominant side: Secondary | ICD-10-CM | POA: Diagnosis not present

## 2021-04-01 DIAGNOSIS — R627 Adult failure to thrive: Secondary | ICD-10-CM

## 2021-04-01 DIAGNOSIS — E876 Hypokalemia: Secondary | ICD-10-CM | POA: Diagnosis present

## 2021-04-01 DIAGNOSIS — E785 Hyperlipidemia, unspecified: Secondary | ICD-10-CM | POA: Diagnosis present

## 2021-04-01 DIAGNOSIS — Z6825 Body mass index (BMI) 25.0-25.9, adult: Secondary | ICD-10-CM | POA: Diagnosis not present

## 2021-04-01 DIAGNOSIS — G8194 Hemiplegia, unspecified affecting left nondominant side: Secondary | ICD-10-CM | POA: Diagnosis not present

## 2021-04-01 DIAGNOSIS — Z20822 Contact with and (suspected) exposure to covid-19: Secondary | ICD-10-CM | POA: Diagnosis present

## 2021-04-01 DIAGNOSIS — Z79899 Other long term (current) drug therapy: Secondary | ICD-10-CM | POA: Diagnosis not present

## 2021-04-01 DIAGNOSIS — Z933 Colostomy status: Secondary | ICD-10-CM | POA: Diagnosis not present

## 2021-04-01 DIAGNOSIS — Z85048 Personal history of other malignant neoplasm of rectum, rectosigmoid junction, and anus: Secondary | ICD-10-CM | POA: Diagnosis not present

## 2021-04-01 DIAGNOSIS — E669 Obesity, unspecified: Secondary | ICD-10-CM | POA: Diagnosis present

## 2021-04-01 DIAGNOSIS — E162 Hypoglycemia, unspecified: Secondary | ICD-10-CM

## 2021-04-01 DIAGNOSIS — C9110 Chronic lymphocytic leukemia of B-cell type not having achieved remission: Secondary | ICD-10-CM | POA: Diagnosis present

## 2021-04-01 DIAGNOSIS — C9480 Other specified leukemias not having achieved remission: Secondary | ICD-10-CM

## 2021-04-01 DIAGNOSIS — I161 Hypertensive emergency: Secondary | ICD-10-CM | POA: Diagnosis present

## 2021-04-01 DIAGNOSIS — G9341 Metabolic encephalopathy: Secondary | ICD-10-CM | POA: Diagnosis present

## 2021-04-01 DIAGNOSIS — I10 Essential (primary) hypertension: Secondary | ICD-10-CM | POA: Diagnosis present

## 2021-04-01 DIAGNOSIS — Z8249 Family history of ischemic heart disease and other diseases of the circulatory system: Secondary | ICD-10-CM | POA: Diagnosis not present

## 2021-04-01 LAB — CBC
HCT: 38.6 % — ABNORMAL LOW (ref 39.0–52.0)
Hemoglobin: 12.8 g/dL — ABNORMAL LOW (ref 13.0–17.0)
MCH: 33.2 pg (ref 26.0–34.0)
MCHC: 33.2 g/dL (ref 30.0–36.0)
MCV: 100.3 fL — ABNORMAL HIGH (ref 80.0–100.0)
Platelets: 151 10*3/uL (ref 150–400)
RBC: 3.85 MIL/uL — ABNORMAL LOW (ref 4.22–5.81)
RDW: 15.5 % (ref 11.5–15.5)
WBC: 6 10*3/uL (ref 4.0–10.5)
nRBC: 0 % (ref 0.0–0.2)

## 2021-04-01 LAB — GLUCOSE, CAPILLARY
Glucose-Capillary: 106 mg/dL — ABNORMAL HIGH (ref 70–99)
Glucose-Capillary: 114 mg/dL — ABNORMAL HIGH (ref 70–99)
Glucose-Capillary: 119 mg/dL — ABNORMAL HIGH (ref 70–99)
Glucose-Capillary: 123 mg/dL — ABNORMAL HIGH (ref 70–99)
Glucose-Capillary: 125 mg/dL — ABNORMAL HIGH (ref 70–99)
Glucose-Capillary: 144 mg/dL — ABNORMAL HIGH (ref 70–99)
Glucose-Capillary: 60 mg/dL — ABNORMAL LOW (ref 70–99)
Glucose-Capillary: 81 mg/dL (ref 70–99)
Glucose-Capillary: 86 mg/dL (ref 70–99)
Glucose-Capillary: 89 mg/dL (ref 70–99)
Glucose-Capillary: 91 mg/dL (ref 70–99)
Glucose-Capillary: 98 mg/dL (ref 70–99)

## 2021-04-01 LAB — LACTIC ACID, PLASMA: Lactic Acid, Venous: 1.2 mmol/L (ref 0.5–1.9)

## 2021-04-01 LAB — COMPREHENSIVE METABOLIC PANEL
ALT: 19 U/L (ref 0–44)
AST: 49 U/L — ABNORMAL HIGH (ref 15–41)
Albumin: 3.8 g/dL (ref 3.5–5.0)
Alkaline Phosphatase: 51 U/L (ref 38–126)
Anion gap: 10 (ref 5–15)
BUN: 8 mg/dL (ref 8–23)
CO2: 25 mmol/L (ref 22–32)
Calcium: 8.9 mg/dL (ref 8.9–10.3)
Chloride: 101 mmol/L (ref 98–111)
Creatinine, Ser: 0.69 mg/dL (ref 0.61–1.24)
GFR, Estimated: >60 mL/min (ref >60)
Glucose, Bld: 146 mg/dL — ABNORMAL HIGH (ref 70–99)
Potassium: 3.3 mmol/L — ABNORMAL LOW (ref 3.5–5.1)
Sodium: 136 mmol/L (ref 135–145)
Total Bilirubin: 2 mg/dL — ABNORMAL HIGH (ref 0.3–1.2)
Total Protein: 7 g/dL (ref 6.5–8.1)

## 2021-04-01 LAB — BLOOD GAS, VENOUS
Acid-Base Excess: 3.1 mmol/L — ABNORMAL HIGH (ref 0.0–2.0)
Bicarbonate: 26.7 mmol/L (ref 20.0–28.0)
O2 Saturation: 78.6 %
Patient temperature: 98.6
pCO2, Ven: 38.8 mmHg — ABNORMAL LOW (ref 44.0–60.0)
pH, Ven: 7.453 — ABNORMAL HIGH (ref 7.250–7.430)
pO2, Ven: 43.6 mmHg (ref 32.0–45.0)

## 2021-04-01 LAB — TSH: TSH: 2.606 u[IU]/mL (ref 0.350–4.500)

## 2021-04-01 LAB — RAPID URINE DRUG SCREEN, HOSP PERFORMED
Amphetamines: NOT DETECTED
Barbiturates: NOT DETECTED
Benzodiazepines: NOT DETECTED
Cocaine: NOT DETECTED
Opiates: NOT DETECTED
Tetrahydrocannabinol: NOT DETECTED

## 2021-04-01 LAB — VITAMIN B12: Vitamin B-12: 692 pg/mL (ref 180–914)

## 2021-04-01 LAB — AMMONIA: Ammonia: 32 umol/L (ref 9–35)

## 2021-04-01 LAB — ETHANOL: Alcohol, Ethyl (B): <10 mg/dL (ref <10)

## 2021-04-01 LAB — FOLATE: Folate: 12.4 ng/mL (ref >5.9)

## 2021-04-01 LAB — SALICYLATE LEVEL: Salicylate Lvl: <7 mg/dL — ABNORMAL LOW (ref 7.0–30.0)

## 2021-04-01 LAB — ACETAMINOPHEN LEVEL: Acetaminophen (Tylenol), Serum: <10 ug/mL — ABNORMAL LOW (ref 10–30)

## 2021-04-01 MED ORDER — SODIUM CHLORIDE 4 MEQ/ML IV SOLN
INTRAVENOUS | Status: DC
  Filled 2021-04-01 (×2): qty 980.75

## 2021-04-01 MED ORDER — DEXTROSE 50 % IV SOLN
INTRAVENOUS | Status: AC
  Filled 2021-04-01: qty 50

## 2021-04-01 MED ORDER — DEXTROSE-NACL 10-0.45 % IV SOLN
INTRAVENOUS | Status: DC
  Filled 2021-04-01 (×6): qty 1000

## 2021-04-01 MED ORDER — DEXTROSE 50 % IV SOLN
12.5000 g | Freq: Once | INTRAVENOUS | Status: AC
  Administered 2021-04-01: 12.5 g via INTRAVENOUS

## 2021-04-01 NOTE — Progress Notes (Signed)
Unable to get admission history, pt. Is A &O X1 at this time.

## 2021-04-01 NOTE — Progress Notes (Addendum)
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, recent diagnosed leukemia, recent diagnosed stroke, dementia, relocated from [place] to [place] 3 weeks ago after discharge from [facility], presented to [facility] due to altered mental status, found to have hypoglycemia    Subjective:  He remains hypoglycemia on D5, this was changed to D10, later today he become more alert and interactive Family at bedside  Assessment & Plan:   Active Problems:   Acute metabolic encephalopathy  Acute metabolic encephalopathy, likely due to hypoglycemia, could also have hypertension encephalopathy, blood pressure was elevated on presentation 204/118 -No source of infection, UA unremarkable, chest x-ray, CT abdomen, MRI brain no acute findings -Ammonia level unremarkable -Blood culture in process -Mental status improved after correcting hypoglycemia, start clear liquid diet, on aspiration precaution, speech eval  hypoglycemia , unclear etiology -Family report patient did not eat well for the last 2 days however he was eating normally before -Bactrim induced hypoglycemia could be on differential -CT abdomen no no abnormality in pancreas -Blood culture in process -check C-peptide, proinsulin, insulin, sulfonylurea -Patient on Bactrim at home Monday Wednesday Friday , Bactrim could lead to hypoglycemia and some people   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, vascular dementia MRI on presentation showed remote right basal ganglia hemorrhagic infarct Continue enalapril, Crestor  Leukemia, unclear detail Followed by Dr Arsenio Loader ([facility] )  appear on venclexta , valtrex 50mg  bid, bactrim 800/16mwf Obtain record, patient has  not establish care with oncology locally, patient was told to follow up with [facility] for leukemia care  Remote h/o colon cancer 52yrs ago, s/p colostomy for 25yrs Colostomy care  FTT, PT,      Skin Assessment:  I have examined the patient's skin and I agree with the wound assessment as performed by the wound care RN as outlined below:     Unresulted Labs (From admission, onward)     Start     Ordered   04/02/21 0500  Comprehensive metabolic panel  Tomorrow morning,   R        04/01/21 2213   04/02/21 0500  Magnesium  Tomorrow morning,   R        04/01/21 2213   04/01/21 2213  CBC with Differential/Platelet  Once,   R        04/01/21 2213   04/01/21 1359  Culture, blood (routine x 2)  BLOOD CULTURE X 2,   R (with TIMED occurrences)      04/01/21 1358   03/31/21 2100  Vitamin B1  Add-on,   AD        03/31/21 2059   03/31/21 1051  Urine culture  ONCE - STAT,   STAT        03/31/21 1051              DVT prophylaxis: enoxaparin (LOVENOX) injection 40 mg Start: 03/31/21 2300 SCDs Start: 03/31/21 2230   Code Status: Full  Family Communication: sister and niece at bedside ,patient was discharged from [facility] , family brought patient to [place] after discharge, obtaining record from [place] , order placed, ( I do not see anything under careeverywhere)  Disposition:   Status is: Inpatient  Dispo: The patient is from: home              Anticipated d/c is to: home health vs SNF              Anticipated d/c date is: possible Tuesday                Consultants:  none  Procedures:  none  Antimicrobials:   Anti-infectives (From admission, onward)    None           Objective: Vitals:   04/01/21 0608 04/01/21 1008 04/01/21 1427 04/01/21 2006  BP: (!) 159/101 (!) 148/104 (!) 149/104 (!) 152/94  Pulse: 73 66 71 83  Resp: 20 14 20 20   Temp: (!) 97.5 F (36.4 C) 98.2 F (36.8 C) 99 F (37.2 C) 99.1 F (37.3  C)  TempSrc: Oral Oral Oral Oral  SpO2: 99% 98% 95% 98%  Weight:        Intake/Output Summary (Last 24 hours) at 04/01/2021 2213 Last data filed at 04/01/2021 1857 Gross per 24 hour  Intake 457.19 ml  Output 1000 ml  Net -542.81 ml   Filed Weights   03/31/21 2228  Weight: 70.2 kg    Examination:  General exam: alert and interactive, no agitation, not oriented to the date but know the year and months, oriented to person and place, he does has poor memory, NAD Respiratory system: Clear to auscultation. Respiratory effort normal. Cardiovascular system: S1 & S2 heard, RRR. No JVD, no murmur, No pedal edema. Gastrointestinal system: Abdomen is nondistended, soft and nontender. Normal bowel sounds heard. + colostomy  Central nervous system: Alert and oriented.x2 to person and place, confused about the date, left hemiparesis . Extremities: left hemiparesis, no edema Skin: No rashes, lesions or ulcers Psychiatry: Judgement and insight appear normal. Mood & affect appropriate. Poor memory, baseline dementia    Data Reviewed: I have personally reviewed following labs and imaging studies  CBC: Recent Labs  Lab 03/31/21 1049 04/01/21 0215  WBC 8.2 6.0  HGB 12.7* 12.8*  HCT 38.5* 38.6*  MCV 102.1* 100.3*  PLT 184 510    Basic Metabolic Panel: Recent Labs  Lab 03/31/21 1049 04/01/21 0215  NA 138 136  K 3.8 3.3*  CL 102 101  CO2 26 25  GLUCOSE 71 146*  BUN 10 8  CREATININE 0.90 0.69  CALCIUM 9.1 8.9    GFR: CrCl cannot be calculated (Unknown ideal weight.).  Liver Function Tests: Recent Labs  Lab 03/31/21 1049 04/01/21 0215  AST 56* 49*  ALT 20 19  ALKPHOS 54 51  BILITOT 2.2* 2.0*  PROT 7.5 7.0  ALBUMIN 4.0 3.8    CBG: Recent Labs  Lab 04/01/21 1331 04/01/21 1606 04/01/21 1722 04/01/21 2008 04/01/21 2208  GLUCAP 91 106* 125* 123* 86     Recent Results (from the past 240 hour(s))  Resp Panel by RT-PCR (Flu A&B, Covid) Nasopharyngeal Swab      Status: None   Collection Time: 03/31/21 10:50 AM   Specimen: Nasopharyngeal Swab; Nasopharyngeal(NP) swabs in vial transport medium  Result Value Ref Range Status   SARS Coronavirus 2 by RT PCR NEGATIVE NEGATIVE Final    Comment: (NOTE) SARS-CoV-2 target nucleic acids are NOT DETECTED.  The SARS-CoV-2 RNA is generally detectable in upper respiratory specimens during the acute phase of infection. The lowest concentration of SARS-CoV-2 viral copies this assay can detect is 138 copies/mL. A negative result does not preclude SARS-Cov-2 infection and should not be  used as the sole basis for treatment or other patient management decisions. A negative result may occur with  improper specimen collection/handling, submission of specimen other than nasopharyngeal swab, presence of viral mutation(s) within the areas targeted by this assay, and inadequate number of viral copies(<138 copies/mL). A negative result must be combined with clinical observations, patient history, and epidemiological information. The expected result is Negative.  Fact Sheet for Patients:  EntrepreneurPulse.com.au  Fact Sheet for Healthcare Providers:  IncredibleEmployment.be  This test is no t yet approved or cleared by the [place] FDA and  has been authorized for detection and/or diagnosis of SARS-CoV-2 by FDA under an Emergency Use Authorization (EUA). This EUA will remain  in effect (meaning this test can be used) for the duration of the COVID-19 declaration under Section 564(b)(1) of the Act, 21 U.S.C.section 360bbb-3(b)(1), unless the authorization is terminated  or revoked sooner.       Influenza A by PCR NEGATIVE NEGATIVE Final   Influenza B by PCR NEGATIVE NEGATIVE Final    Comment: (NOTE) The Xpert Xpress SARS-CoV-2/FLU/RSV plus assay is intended as an aid in the diagnosis of influenza from Nasopharyngeal swab specimens and should not be used as a sole basis  for treatment. Nasal washings and aspirates are unacceptable for Xpert Xpress SARS-CoV-2/FLU/RSV testing.  Fact Sheet for Patients: EntrepreneurPulse.com.au  Fact Sheet for Healthcare Providers: IncredibleEmployment.be  This test is not yet approved or cleared by the [place] FDA and has been authorized for detection and/or diagnosis of SARS-CoV-2 by FDA under an Emergency Use Authorization (EUA). This EUA will remain in effect (meaning this test can be used) for the duration of the COVID-19 declaration under Section 564(b)(1) of the Act, 21 U.S.C. section 360bbb-3(b)(1), unless the authorization is terminated or revoked.  Performed at [facility], [place] [address] [place], [place] [address]          Radiology Studies: CT ABDOMEN PELVIS WO CONTRAST  Result Date: 03/31/2021 CLINICAL DATA:  Acute, non localized abdominal pain. EXAM: CT ABDOMEN AND PELVIS WITHOUT CONTRAST TECHNIQUE: Multidetector CT imaging of the abdomen and pelvis was performed following the standard protocol without IV contrast. COMPARISON:  None. FINDINGS: Lower chest: Mildly enlarged heart. Minimal bibasilar atelectasis/scarring. Hepatobiliary: Normal appearing liver. Poorly distended gallbladder. Pancreas: Unremarkable. No pancreatic ductal dilatation or surrounding inflammatory changes. Spleen: Normal in size without focal abnormality. Adrenals/Urinary Tract: The adrenal glands are not well visualized due to streak artifacts produced by the patient's arms cysts. No gross adrenal abnormalities are seen. Multiple probable bilateral renal cysts, difficult to assess due to the streak artifacts. Tiny lower pole right renal calculus. Tiny upper pole left renal calculus. No bladder or ureteral calculi seen. Portions of the bladder and distal ureters are obscured by streak artifacts from a right prosthesis. Stomach/Bowel: Small hiatal hernia. Single proximal sigmoid  colon diverticulum. No visible small bowel abnormalities. No findings suspicious for appendicitis. Vascular/Lymphatic: No significant vascular findings are present. No enlarged abdominal or pelvic lymph nodes. Reproductive: Probable moderately enlarged prostate gland, poorly visualized due to streak artifacts. Other: Bilateral hydroceles.  Left lower quadrant ostomy. Musculoskeletal: Right hip prosthesis with associated streak artifacts. Marked left hip degenerative changes. Moderate thoracolumbar scoliosis and extensive degenerative changes. IMPRESSION: 1. Limited examination with no gross acute abnormality. 2. Tiny bilateral, nonobstructing renal calculi. 3. Small hiatal hernia. Electronically Signed   By: Claudie Revering M.D.   On: 03/31/2021 18:15   X-ray chest PA and lateral  Result Date: 04/01/2021 CLINICAL DATA:  Follow-up exam EXAM: CHEST - 2 VIEW COMPARISON:  03/31/2021 FINDINGS: Heart is borderline in size. No confluent airspace opacities or effusions. Mediastinal contours are within normal limits. No evidence of aortic aneurysm. IMPRESSION: Borderline heart size.  No active disease. Electronically Signed   By: Rolm Baptise M.D.   On: 04/01/2021 00:26   CT Head Wo Contrast  Result Date: 03/31/2021 CLINICAL DATA:  Altered mental status following a fall this morning. EXAM: CT HEAD WITHOUT CONTRAST TECHNIQUE: Contiguous axial images were obtained from the base of the skull through the vertex without intravenous contrast. COMPARISON:  None. FINDINGS: Brain: Mildly enlarged ventricles and cortical sulci. Mild patchy white matter low density in both cerebral hemispheres. No intracranial hemorrhage, mass lesion or CT evidence of acute infarction. Vascular: No hyperdense vessel or unexpected calcification. Skull: Normal. Negative for fracture or focal lesion. Sinuses/Orbits: Completely opacified right maxillary sinus with extensive mucosal thickening in the right ethmoid and frontal sinuses. Unremarkable  orbits. Other: None. IMPRESSION: 1. No acute abnormality. 2. Mild diffuse cerebral atrophy. 3. Mild chronic small vessel white matter ischemic changes in both cerebral hemispheres. 4. Extensive chronic right maxillary, ethmoid and frontal sinusitis. Electronically Signed   By: Claudie Revering M.D.   On: 03/31/2021 13:33   MR BRAIN WO CONTRAST  Result Date: 03/31/2021 CLINICAL DATA:  Initial evaluation for delirium. EXAM: MRI HEAD WITHOUT CONTRAST TECHNIQUE: Multiplanar, multiecho pulse sequences of the brain and surrounding structures were obtained without intravenous contrast. COMPARISON:  Prior CT from earlier the same day. FINDINGS: Brain: Examination technically limited by extensive motion artifact, markedly degrading multiple sequences. Generalized age-related cerebral atrophy. Scattered patchy T2/FLAIR hyperintensity within the periventricular deep white matter both cerebral hemispheres as well as the pons, most consistent with chronic small vessel ischemic disease, mild to moderate in nature. Remote right basal ganglia infarct with prominent susceptibility artifact, consistent with a chronic hemorrhagic infarct (series 26, image 33). No acute hemorrhage seen at this location on prior CT. No abnormal foci of restricted diffusion to suggest acute or subacute ischemia. Gray-white matter differentiation maintained. No encephalomalacia to suggest chronic cortical infarction. No other acute or chronic intracranial hemorrhage. No mass lesion, midline shift or mass effect. Ventricles normal size without hydrocephalus. No extra-axial fluid collection. Pituitary gland suprasellar region within normal limits. Midline structures intact. Vascular: Major intracranial vascular flow voids are grossly maintained at the skull base. Skull and upper cervical spine: Craniocervical junction within normal limits. Bone marrow signal intensity normal. No focal marrow replacing lesion. No scalp soft tissue abnormality.  Sinuses/Orbits: Globes and orbital soft tissues demonstrate no acute finding. Chronic right frontoethmoidal and maxillary sinusitis noted. Trace left mastoid effusion, of doubtful significance. Other: None. IMPRESSION: 1. Technically limited exam due to extensive motion artifact. 2. No acute intracranial abnormality. 3. Remote right basal ganglia hemorrhagic infarct. 4. Underlying moderate chronic microvascular ischemic disease. 5. Chronic right sided paranasal sinus disease. Electronically Signed   By: Jeannine Boga M.D.   On: 03/31/2021 20:02   DG Chest Portable 1 View  Result Date: 03/31/2021 CLINICAL DATA:  Altered mental status. EXAM: PORTABLE CHEST 1 VIEW COMPARISON:  None. FINDINGS: Numerous leads and wires project over the chest. Mild right hemidiaphragm elevation. Midline trachea. Normal heart size. Mildly prominent transverse aorta. No pleural effusion or pneumothorax. Clear lungs. IMPRESSION: No acute cardiopulmonary disease. Mildly prominent transverse aorta, possibly related AP portable technique. Consider nonemergent follow-up with PA and lateral radiographs. Electronically Signed   By: Abigail Miyamoto M.D.   On: 03/31/2021 11:49  Scheduled Meds:  enalapril  20 mg Oral BID   enoxaparin (LOVENOX) injection  40 mg Subcutaneous Q24H   pantoprazole  40 mg Oral Daily   rosuvastatin  20 mg Oral QHS   Continuous Infusions:  dextrose 10 % and 0.45 % NaCl Stopped (04/01/21 1000)     LOS: 0 days   Time spent: 32mins Greater than 50% of this time was spent in counseling, explanation of diagnosis, planning of further management, and coordination of care.   Voice Recognition Viviann Spare dictation system was used to create this note, attempts have been made to correct errors. Please contact the author with questions and/or clarifications.   Florencia Reasons, MD PhD FACP [facility]  Available via Epic secure chat 7am-7pm for nonurgent issues Please page for urgent issues To page  the attending provider between 7A-7P or the covering provider during after hours 7P-7A, please log into the web site [email or web address] and access using universal [place] password for that web site. If you do not have the password, please call the hospital operator.    04/01/2021, 10:13 PM

## 2021-04-01 NOTE — Progress Notes (Signed)
0700 CBG 60, initiated hypoglycemic protocol. Paged MD. Hanley Seamen Dextrose 50% solution 12.5mg . Rechecked CBG 144. Will continue to monitor patient's blood sugar.

## 2021-04-02 DIAGNOSIS — G9341 Metabolic encephalopathy: Secondary | ICD-10-CM | POA: Diagnosis not present

## 2021-04-02 LAB — CBC WITH DIFFERENTIAL/PLATELET
Abs Immature Granulocytes: 0.02 10*3/uL (ref 0.00–0.07)
Basophils Absolute: 0.1 10*3/uL (ref 0.0–0.1)
Basophils Relative: 1 %
Eosinophils Absolute: 0.1 10*3/uL (ref 0.0–0.5)
Eosinophils Relative: 2 %
HCT: 34.7 % — ABNORMAL LOW (ref 39.0–52.0)
Hemoglobin: 11.7 g/dL — ABNORMAL LOW (ref 13.0–17.0)
Immature Granulocytes: 0 %
Lymphocytes Relative: 13 %
Lymphs Abs: 0.8 10*3/uL (ref 0.7–4.0)
MCH: 33.9 pg (ref 26.0–34.0)
MCHC: 33.7 g/dL (ref 30.0–36.0)
MCV: 100.6 fL — ABNORMAL HIGH (ref 80.0–100.0)
Monocytes Absolute: 0.8 10*3/uL (ref 0.1–1.0)
Monocytes Relative: 14 %
Neutro Abs: 4.1 10*3/uL (ref 1.7–7.7)
Neutrophils Relative %: 70 %
Platelets: 160 10*3/uL (ref 150–400)
RBC: 3.45 MIL/uL — ABNORMAL LOW (ref 4.22–5.81)
RDW: 15.4 % (ref 11.5–15.5)
WBC: 5.9 10*3/uL (ref 4.0–10.5)
nRBC: 0 % (ref 0.0–0.2)

## 2021-04-02 LAB — MAGNESIUM: Magnesium: 1.6 mg/dL — ABNORMAL LOW (ref 1.7–2.4)

## 2021-04-02 LAB — GLUCOSE, CAPILLARY
Glucose-Capillary: 123 mg/dL — ABNORMAL HIGH (ref 70–99)
Glucose-Capillary: 102 mg/dL — ABNORMAL HIGH (ref 70–99)
Glucose-Capillary: 100 mg/dL — ABNORMAL HIGH (ref 70–99)
Glucose-Capillary: 92 mg/dL (ref 70–99)
Glucose-Capillary: 103 mg/dL — ABNORMAL HIGH (ref 70–99)
Glucose-Capillary: 79 mg/dL (ref 70–99)
Glucose-Capillary: 86 mg/dL (ref 70–99)
Glucose-Capillary: 95 mg/dL (ref 70–99)
Glucose-Capillary: 94 mg/dL (ref 70–99)
Glucose-Capillary: 91 mg/dL (ref 70–99)
Glucose-Capillary: 97 mg/dL (ref 70–99)

## 2021-04-02 LAB — COMPREHENSIVE METABOLIC PANEL
ALT: 14 U/L (ref 0–44)
AST: 25 U/L (ref 15–41)
Albumin: 3 g/dL — ABNORMAL LOW (ref 3.5–5.0)
Alkaline Phosphatase: 42 U/L (ref 38–126)
Anion gap: 4 — ABNORMAL LOW (ref 5–15)
BUN: 8 mg/dL (ref 8–23)
CO2: 25 mmol/L (ref 22–32)
Calcium: 8.3 mg/dL — ABNORMAL LOW (ref 8.9–10.3)
Chloride: 105 mmol/L (ref 98–111)
Creatinine, Ser: 0.72 mg/dL (ref 0.61–1.24)
GFR, Estimated: >60 mL/min (ref >60)
Glucose, Bld: 98 mg/dL (ref 70–99)
Potassium: 3.2 mmol/L — ABNORMAL LOW (ref 3.5–5.1)
Sodium: 134 mmol/L — ABNORMAL LOW (ref 135–145)
Total Bilirubin: 1.4 mg/dL — ABNORMAL HIGH (ref 0.3–1.2)
Total Protein: 5.8 g/dL — ABNORMAL LOW (ref 6.5–8.1)

## 2021-04-02 MED ORDER — POTASSIUM CHLORIDE 20 MEQ PO PACK
40.0000 meq | PACK | Freq: Once | ORAL | Status: AC
  Administered 2021-04-02: 40 meq via ORAL
  Filled 2021-04-02: qty 2

## 2021-04-02 MED ORDER — MAGNESIUM SULFATE 2 GM/50ML IV SOLN
2.0000 g | Freq: Once | INTRAVENOUS | Status: AC
  Administered 2021-04-02: 2 g via INTRAVENOUS
  Filled 2021-04-02: qty 50

## 2021-04-02 NOTE — Evaluation (Signed)
Physical Therapy Evaluation Patient Details Name: Aaron Gonzalez MRN: [id number] DOB: Feb 20, 1946 Today's Date: 04/02/2021   History of Present Illness  Patient is a 75 y.o. male with PMH: remote colon cancer s/p colostomy 20 years ago, recently diagnosed leukemia, dementia, who recently moved to [place], [place] from [facility] following discharge from [facility] 3 weeks prior to current admission. He suffered a CVA while in [place] which prompted hospitalization.  Current admission to [facility] secondary to AMS, found to be hypoglycemic on presentation. 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  Clinical Impression  Pt admitted with above diagnosis. Pt currently with functional limitations due to the deficits listed below (see PT Problem List). Pt will benefit from skilled PT to increase their independence and safety with mobility to allow discharge to the venue listed below.  Pt presents with residual left sided weakness since CVA in March as well as balance deficits.  Pt reports ambulating with RW or SPC since stroke however family reports recent decline in mobility leading to hospitalization.  Pt also endorses watching more TV lately.  Family did take him to OPPT at [facility] however they only provided minimal HEP per family (so he only had one visit).  Discussed benefits of neuro OPPT however family does not feel able to safely transport pt and would like HHPT instead.  Pt would also benefit from aide if possible.      Follow Up Recommendations Home health PT;Supervision for mobility/OOB    Equipment Recommendations  None recommended by PT    Recommendations for Other Services       Precautions / Restrictions Precautions Precautions: Fall Restrictions Weight Bearing Restrictions: No      Mobility  Bed Mobility Overal bed mobility: Needs Assistance Bed Mobility:  Supine to Sit     Supine to sit: HOB elevated;Min guard     General bed mobility comments: pt in recliner    Transfers Overall transfer level: Needs assistance Equipment used: Rolling walker (2 wheeled) Transfers: Sit to/from Stand Sit to Stand: Min guard Stand pivot transfers: Min assist       General transfer comment: pt recalls hand placement, min/guard for safety  Ambulation/Gait Ambulation/Gait assistance: Min guard Gait Distance (Feet): 400 Feet Assistive device: Rolling walker (2 wheeled) Gait Pattern/deviations: Step-through pattern;Decreased stride length;Narrow base of support Gait velocity: decreased   General Gait Details: verbal cues for remaining inside RW, pt tends to keep RW too far forward, small short steps, no LOB with RW  Stairs            Wheelchair Mobility    Modified Rankin (Stroke Patients Only)       Balance Overall balance assessment: History of Falls;Needs assistance   Sitting balance-Leahy Scale: Good Sitting balance - Comments: Cues not to scoot too close to EOB for safety.   Standing balance support: Bilateral upper extremity supported Standing balance-Leahy Scale: Poor Standing balance comment: Static:Fair, Dynamic: Poor with loss of balance and need of BUE support on RW.               High Level Balance Comments: Pt attempted SLS holding RW and then letting go however unable to perform on Lt LE once no UE support and only able to hold approx 3 seconds with Rt SLS             Pertinent Vitals/Pain Pain Assessment: Faces Faces Pain Scale: Hurts little more Pain Location: "  everywhere" Pain Descriptors / Indicators: Sore Pain Intervention(s): Repositioned;Monitored during session    Home Living Family/patient expects to be discharged to:: Private residence Living Arrangements: Other relatives (Per sister, pt to move in with her.  Below information about sister's home.) Available Help at Discharge:  Family;Available 24 hours/day (Sister will be available 24/7; Niece lives right next door.)   Home Access: Stairs to enter Entrance Stairs-Rails: Can reach both Entrance Stairs-Number of Steps: 5 in front with Bil rail that can be reached at same time.  In back there are 2 shallow steps, a platform then 1 more step into house all without rail. Home Layout: One level Home Equipment: Adaptive equipment;Walker - 2 wheels;Shower seat      Prior Function Level of Independence: Independent with assistive device(s);Independent         Comments: Prior to his CVA in March 2022, pt reports total independence. Since returning to family's home after Rehab, family reports that pt was modified independent, using adaptive equipment for LB ADLs and ambulating Mod I with his RW. They report an about 2 week gradual decline in function prior to admission to point where pt was falling and unable to perform his own hygiene, make it to the bathroom in time, or remember to use his RW.     Hand Dominance   Dominant Hand: Left    Extremity/Trunk Assessment   Upper Extremity Assessment Upper Extremity Assessment: Overall WFL for tasks assessed;LUE deficits/detail LUE Deficits / Details: Slight Asymmetry with RUE and 4-/5 shoulder, tricep, and digit extensors.  Whereas RUE WNL at least 4/5. LUE Sensation: WNL LUE Coordination: decreased fine motor    Lower Extremity Assessment Lower Extremity Assessment: RLE deficits/detail;LLE deficits/detail RLE Deficits / Details: grossly 4-/5 throughout LLE Deficits / Details: grossly 3+/5 throughout, audible crepitus left hip with movement    Cervical / Trunk Assessment Cervical / Trunk Assessment: Normal  Communication   Communication: No difficulties  Cognition Arousal/Alertness: Awake/alert Behavior During Therapy: WFL for tasks assessed/performed Overall Cognitive Status: Impaired/Different from baseline Area of Impairment: Memory                      Memory: Decreased short-term memory         General Comments: recent dx dementia?      General Comments      Exercises     Assessment/Plan    PT Assessment Patient needs continued PT services  PT Problem List Decreased strength;Decreased mobility;Decreased activity tolerance;Decreased balance;Decreased knowledge of use of DME;Decreased cognition       PT Treatment Interventions DME instruction;Gait training;Balance training;Therapeutic exercise;Functional mobility training;Stair training;Therapeutic activities;Patient/family education;Neuromuscular re-education    PT Goals (Current goals can be found in the Care Plan section)  Acute Rehab PT Goals Patient Stated Goal: "More therapy" to minimize pt's shuffling when walking and to ensure pt can attend to his own toileting and hygiene. PT Goal Formulation: With patient/family Time For Goal Achievement: 04/16/21 Potential to Achieve Goals: Good    Frequency Min 3X/week   Barriers to discharge        Co-evaluation               AM-PAC PT "6 Clicks" Mobility  Outcome Measure Help needed turning from your back to your side while in a flat bed without using bedrails?: A Little Help needed moving from lying on your back to sitting on the side of a flat bed without using bedrails?: A Little Help needed moving to and from  a bed to a chair (including a wheelchair)?: A Little Help needed standing up from a chair using your arms (e.g., wheelchair or bedside chair)?: A Little Help needed to walk in hospital room?: A Little Help needed climbing 3-5 steps with a railing? : A Lot 6 Click Score: 17    End of Session Equipment Utilized During Treatment: Gait belt Activity Tolerance: Patient tolerated treatment well Patient left: in chair;with call bell/phone within reach;with chair alarm set;with family/visitor present   PT Visit Diagnosis: Other abnormalities of gait and mobility (R26.89)    Time: 4360-6770 PT Time  Calculation (min) (ACUTE ONLY): 31 min   Charges:   PT Evaluation $PT Eval Moderate Complexity: 1 Mod PT Treatments $Gait Training: 8-22 mins       Jannette Spanner PT, DPT Acute Rehabilitation Services Pager: [phone] Office: [phone]   York Ram E 04/02/2021, 3:25 PM

## 2021-04-02 NOTE — Evaluation (Addendum)
Occupational Therapy Evaluation Patient Details Name: Aaron Gonzalez MRN: [id number] DOB: [birth year] Today's Date: 04/02/2021    History of Present Illness Patient is a 75 y.o. 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 He suffered a CVA while in [place] which prompted hospitalization.  Current admission to [facility] secondary to AMS, found to be hypoglycemic on presentation. 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   Clinical Impression   Patient is currently requiring assistance with ADLs including minimal assist with toileting, total assist with LE dressing in sitting without adaptive equipment, at least minimal assist with bathing, and minimal assist with standing LE dressing and full setup with seated grooming, all of which is below patient's typical baseline of being Independent. Prior to his stroke in April 2022 and his move to [place]. During this evaluation, patient was limited by decreased dynamic standing balance with 1 LOB during pivot to chair with RW, residual LUE weakness of grossly 4-/5 with decreased coordination to LT hand, cognitive deficits which pt's family report are resolving now, but some deficits remain with SBT score of 6 indicating borderline dementia score, all of which has the potential to impact patient's safety and independence during functional mobility, as well as performance for ADLs. [place] "6-clicks" Daily Activity Inpatient Short Form score of 17/24 indicates 50.11% ADL impairment this session. Patient now lives with sister, who is able to provide 24/7 supervision and assistance.  Patient demonstrates good rehab potential, and should benefit from continued  skilled occupational therapy services while in acute care to maximize safety, independence and quality of life at home.  Continued occupational therapy services in a CIR setting prior to return home is recommended.  ?    Follow Up Recommendations  CIR (If pt does not qualify for CIR, would recommend home health PT and OT and 24 hour supervision with family training.)    Equipment Recommendations  3 in 1 bedside commode    Recommendations for Other Services Rehab consult     Precautions / Restrictions Precautions Precautions: Fall Restrictions Weight Bearing Restrictions: No      Mobility Bed Mobility Overal bed mobility: Needs Assistance Bed Mobility: Supine to Sit     Supine to sit: HOB elevated;Min guard     General bed mobility comments: Min guard for trunk. Increased time/effort. Patient Response: Cooperative  Transfers Overall transfer level: Needs assistance   Transfers: Sit to/from Stand;Stand Pivot Transfers Sit to Stand: Min guard;From elevated surface Stand pivot transfers: Min assist            Balance Overall balance assessment: History of Falls;Needs assistance   Sitting balance-Leahy Scale: Good Sitting balance - Comments: Cues not to scoot too close to EOB for safety.     Standing balance-Leahy Scale: Poor Standing balance comment: Static:Fair, Dynamic: Poor with loss of balance and need of BUE support on RW.                           ADL either performed or assessed with clinical judgement   ADL Overall ADL's : Needs assistance/impaired Eating/Feeding: Modified independent   Grooming: Sitting;Set up   Upper Body Bathing: Sitting;Set up   Lower Body Bathing: Minimal assistance;Sitting/lateral leans;Sit to/from stand  Upper Body Dressing : Min guard;Set up;Sitting   Lower Body Dressing: Total assistance;Minimal assistance;Sit to/from stand Lower Body Dressing Details (indicate cue type and reason): Total assist for socks,  but pt uses sock aid at home. Pt also able to describe how he uses his long reacher for donning pants,etc and verbalized correct sequence to dress weaker LLE first. Would benefit from having AE kit available to him to demonstrate understanding.  Standing LE dressing with Min as. Toilet Transfer: Minimal assistance;RW per general assessment. Pt has 20 years colostomy and is independent with management for 20 years. Pt on external male catheter. Toilet Transfer Details (indicate cue type and reason): Pt with 1 LOB with need of Min Assist to correct while pivoting to recliner with RW. Toileting- Clothing Manipulation and Hygiene: Minimal assistance;Sitting/lateral lean;Sit to/from stand       Functional mobility during ADLs: Minimal assistance;Min guard;Rolling walker       Vision Baseline Vision/History: No visual deficits;Wears glasses Wears Glasses: At all times (Not at hospital.) Vision Assessment?: No apparent visual deficits     Perception     Praxis      Pertinent Vitals/Pain Pain Assessment: Faces Faces Pain Scale: Hurts little more Pain Location: RT rib area during bed mobility. Pain Descriptors / Indicators: Tender Pain Intervention(s): Limited activity within patient's tolerance;Monitored during session;Repositioned     Hand Dominance Left   Extremity/Trunk Assessment Upper Extremity Assessment Upper Extremity Assessment: Overall WFL for tasks assessed;LUE deficits/detail LUE Deficits / Details: Slight Asymmetry with RUE and 4-/5 shoulder, tricep, and digit extensors.  Whereas RUE WNL at least 4/5. LUE Sensation: WNL LUE Coordination: decreased fine motor       Cervical / Trunk Assessment Cervical / Trunk Assessment: Normal   Communication Communication Communication: No difficulties   Cognition Arousal/Alertness: Awake/alert Behavior During Therapy: WFL for tasks assessed/performed Overall Cognitive Status: Impaired/Different from baseline Area of Impairment:  Memory                     Memory: Decreased short-term memory         General Comments: Pt completed Short Blessed Cognitive screen with score of 6, suggesting a possible cognitive impairment but borderline (cut off score for normal is 5). Results and possible implications discussed with pt and family. Pt endorsed memory impairments.   General Comments       Exercises     Shoulder Instructions      Home Living Family/patient expects to be discharged to:: Private residence Living Arrangements: Other relatives (lives with sister) Available Help at Discharge: Family;Available 24 hours/day (Sister will be available 24/7; Niece lives right next door.)   Home Access: Stairs to enter [employer] of Steps: 5 in front with Bil rail that can be reached at same time.  In back there are 2 shallow steps, a platform then 1 more step into house all without rail. Entrance Stairs-Rails: Can reach both Home Layout: One level     Bathroom Shower/Tub: Teacher, early years/pre: Handicapped height     Home Equipment: Adaptive equipment;Walker - 2 wheels;Shower seat Adaptive Equipment: Reacher;Sock aid;Long-handled shoe horn;Long-handled sponge        Prior Functioning/Environment Level of Independence: Independent with assistive device(s);Independent        Comments: Prior to his CVA in March 2022, pt reports total independence. Since returning to family's home after Rehab, family reports that pt was modified independent, using adaptive equipment for LB ADLs and ambulating Mod I with his  RW. They report an about 2 week gradual decline in function prior to admission to point where pt was falling and unable to perform his own hygiene, make it to the bathroom in time, or remember to use his RW.        OT Problem List: Decreased strength;Decreased coordination;Pain;Cardiopulmonary status limiting activity;Decreased cognition;Decreased activity tolerance;Decreased  safety awareness;Decreased knowledge of use of DME or AE;Impaired balance (sitting and/or standing);Impaired UE functional use      OT Treatment/Interventions: Self-care/ADL training;Therapeutic exercise;Therapeutic activities;Neuromuscular education;Cognitive remediation/compensation;DME and/or AE instruction;Patient/family education;Balance training;Energy conservation    OT Goals(Current goals can be found in the care plan section) Acute Rehab OT Goals Patient Stated Goal: "More therapy" to minimize pt's shuffling when walking and to ensure pt can attend to his own toileting and hygiene. OT Goal Formulation: With family Time For Goal Achievement: 04/16/21 Potential to Achieve Goals: Good ADL Goals Pt Will Perform Grooming: with modified independence;standing Pt Will Perform Lower Body Dressing: with modified independence;with adaptive equipment;sitting/lateral leans;sit to/from stand Pt Will Transfer to Toilet: with modified independence;ambulating (Comfort height toiet) Pt Will Perform Toileting - Clothing Manipulation and hygiene: with modified independence;with adaptive equipment;sitting/lateral leans;sit to/from stand Additional ADL Goal #2: Pt will engage in 8+ min standing functional activities with supervision and without loss of balance, in order to demonstrate improved activity tolerance and balance needed to perform ADLs safely at home.  OT Frequency: Min 2X/week   Barriers to D/C:    No known, but sister asks that pt become as functional and independent as possible prior to return home for pt and her safety.       Co-evaluation              AM-PAC OT "6 Clicks" Daily Activity     Outcome Measure Help from another person eating meals?: None Help from another person taking care of personal grooming?: A Little Help from another person toileting, which includes using toliet, bedpan, or urinal?: A Little Help from another person bathing (including washing, rinsing,  drying)?: A Little Help from another person to put on and taking off regular upper body clothing?: A Little Help from another person to put on and taking off regular lower body clothing?: Total 6 Click Score: 17   End of Session Equipment Utilized During Treatment: Gait belt;Rolling walker Nurse Communication: Mobility status  Activity Tolerance: Patient tolerated treatment well Patient left: in chair;with call bell/phone within reach;with chair alarm set;with family/visitor present  OT Visit Diagnosis: Unsteadiness on feet (R26.81);Other symptoms and signs involving cognitive function;Pain;History of falling (Z91.81);Hemiplegia and hemiparesis Hemiplegia - Right/Left: Left Hemiplegia - dominant/non-dominant: Dominant Hemiplegia - caused by: Cerebral infarction Pain - Right/Left: Right Pain - part of body:  (Ribs)                Time: 5248-1859 OT Time Calculation (min): 54 min Charges:  OT General Charges $OT Visit: 1 Visit OT Evaluation $OT Eval Low Complexity: 1 Low OT Treatments $Self Care/Home Management : 8-22 mins $Therapeutic Activity: 8-22 mins $Cognitive Funtion inital: Initial 15 mins  Anderson Malta, OT Acute Rehab Services Office: [phone] 04/02/2021  Julien Girt 04/02/2021, 1:22 PM

## 2021-04-02 NOTE — Evaluation (Signed)
Clinical/Bedside Swallow Evaluation Patient Details  Name: Aaron Gonzalez MRN: [id number] Date of Birth: June 24, 1946  Today's Date: 04/02/2021 Time: SLP Start Time (ACUTE ONLY): 1055 SLP Stop Time (ACUTE ONLY): 1115 SLP Time Calculation (min) (ACUTE ONLY): 20 min  Past Medical History: History reviewed. No pertinent past medical history. Past Surgical History: History reviewed. No pertinent surgical history. HPI:  Patient is a 75 y.o. 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 He suffered a CVA while in [place] which prompted hospitalization. Per patient and family report, he and his son had noticed he was having trouble with falling over to one side, difficulty with standing and significant difficulty with walking. Son called EMS and patient was admitted to hospital; as patient had 24 steps to climb in his residence in [place], social worker had recommended SNF; family and patient did not want SNF and so he moved to [place] to be with family. Current admission to [facility] secondary to AMS, found to be hypoglycemic on presentation. 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   Assessment / Plan / Recommendation Clinical Impression  Patient presents with an oropharyngeal swallow that appears WFL-WNL and is without overt s/s aspiration or penetration. Patient and family (sister) both deny any recent h/o dysphagia and denied patient requiring speech-language therapy or dysphagia therapy when hospitalized in [place] after suffering CVA. SLP is recommending patient continue with regular solids, thin liquids and no f/u recommended at this time. SLP Visit Diagnosis: Dysphagia, unspecified (R13.10)    Aspiration  Risk  No limitations    Diet Recommendation Regular;Thin liquid   Liquid Administration via: Cup;Straw Medication Administration: Whole meds with liquid Supervision: Patient able to self feed Postural Changes: Seated upright at 90 degrees    Other  Recommendations Oral Care Recommendations: Oral care BID;Patient independent with oral care   Follow up Recommendations None      Frequency and Duration   N/A         Prognosis   N/A     Swallow Study   General Date of Onset: 03/31/21 HPI: Patient is a 75 y.o. 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 He suffered a CVA while in [place] which prompted hospitalization. Per patient and family report, he and his son had noticed he was having trouble with falling over to one side, difficulty with standing and significant difficulty with walking. Son called EMS and patient was admitted to hospital; as patient had 24 steps to climb in his residence in [place], social worker had recommended SNF; family and patient did not want SNF and so he moved to [place] to be with family. Current admission to [facility] secondary to AMS, found to be hypoglycemic on presentation. 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 Type of Study: Bedside Swallow Evaluation Previous Swallow Assessment: None found, patient and sister both deny dysphagia treatment during hospitalization in [place] Prior to this Study: Thin liquids;Regular Temperature Spikes Noted: No Respiratory Status: Room air History of Recent Intubation: No Behavior/Cognition: Alert;Cooperative;Pleasant mood Oral Cavity Assessment: Within  Functional Limits Oral Care Completed by SLP: No Oral Cavity -  Dentition: Adequate natural dentition Vision: Functional for self-feeding Self-Feeding Abilities: Able to feed self Patient Positioning: Upright in bed Baseline Vocal Quality: Normal Volitional Cough: Strong Volitional Swallow: Able to elicit    Oral/Motor/Sensory Function Overall Oral Motor/Sensory Function: Within functional limits   Ice Chips     Thin Liquid Thin Liquid: Within functional limits Presentation: Straw;Self Fed    Nectar Thick   N/A  Honey Thick   N/A  Puree Puree: Not tested   Solid     Solid: Within functional limits Presentation: [place], MA, CCC-SLP Speech Therapy

## 2021-04-02 NOTE — Consult Note (Signed)
[place] Nurse ostomy follow up Patient receiving care in [place]. Patient has had his colostomy for at least 20 years. Stoma type/location: LUQ colostomy Stomal assessment/size: stoma pink, moist. budded Peristomal assessment: deferred; existing closed end, opaque pouch newly placed by patient prior to arrival to hospital Treatment options for stomal/peristomal skin: patient does not use paste or barrier ring Output: none observed Ostomy pouching: 2pc. 2 and 1/4 inch.  Pouch, Kellie Simmering #[id number]; skin barrier, Kellie Simmering [phone]  Education provided: how the lock and roll works on our pouch, how to squeeze from the sides to open the "tail" to empty.  I explained we do not carry to closed end pouch and what I am providing is our closest substitute.  Also, that our staff will help him as needed.  Patient verbalized his understanding. Thank you for the consult.  Discussed plan of care with the patient and bedside nurse.  [place] nurse will not follow at this time.  Please re-consult the [place] team if needed.  Val Riles, RN, MSN, CWOCN, CNS-BC, pager [phone]

## 2021-04-02 NOTE — Progress Notes (Signed)
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  Found to be hypoglycemic on presentation.  Assessment & Plan:   Active Problems:   Acute metabolic encephalopathy  Acute metabolic encephalopathy: Thought to be secondary to hypoglycemia.  Patient was also hypertensive on presentation so hypertensive emergency could also be additional factor.  No source of infection: UA unremarkable, chest x-ray Pneumonia.  MRI of the brain did not show any acute intracranial normalities.  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  Ammonia level normal. Mental status improved after correcting hypoglycemia.  Currently he is alert and oriented.  Hypoglycemia: Unclear etiology.  Patient was having poor oral intake at home.  CT abdomen did not show any abnormality in the pancreas.  C-peptide, proinsulin, insulin, sulfonylurea levels pending Also thought to be from possibly Bactrim induced hypoglycemia Continue IV fluids containing dextrose.  Will monitor CBC.  History of leukemia: Unclear detail.  He was following with Dr. Arsenio Loader at [facility].  On Venclexta, Valtrex, Bactrim.  Patient has not established care with oncology locally.  He has a plan to follow-up at [facility] for leukemia.  We can also refer him to the oncology department over here. He has history of colon cancer 20 years ago, s/p colostomy.  Hypokalemia/hypomagnesemia: Supplemented with potassium and magnesium.  Generalized weakness/failure to thrive: PT/OT consulted.  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          DVT prophylaxis:Lovenox Code Status: Full Family Communication: Family at bedside Status is: Inpatient  Remains inpatient appropriate because:Unsafe d/c plan  Dispo: The patient is from: Home              Anticipated d/c is to: Home              Patient currently is not medically stable to d/c.   Difficult to place patient No      Consultants: None  Procedures:None  Antimicrobials:  Anti-infectives (From admission, onward)    None       Subjective:  Patient seen and examined the bedside this morning.  Hemodynamically stable.  Comfortable.  Alert and oriented.  Denies any complaints.  Family were concerned about establishing care with oncology as an outpatient here.  Objective: Vitals:   04/01/21 1008 04/01/21 1427 04/01/21 2006 04/02/21 0421  BP: (!) 148/104 (!) 149/104 (!) 152/94 131/85  Pulse: 66 71 83 73  Resp: 14 20 20 16   Temp: 98.2 F (36.8 C) 99 F (37.2 C) 99.1 F (37.3 C) 98.4 F (36.9 C)  TempSrc: Oral Oral Oral Oral  SpO2: 98% 95% 98% 100%  Weight:        Intake/Output Summary (Last 24 hours) at 04/02/2021 1006 Last data filed at 04/02/2021 0500 Gross per 24 hour  Intake 457.19 ml  Output 1100 ml  Net -642.81 ml   Filed Weights   03/31/21 2228  Weight: 70.2 kg    Examination:  General exam: Appears calm and comfortable ,Not in distress, obese HEENT:PERRL,Oral mucosa moist, Ear/Nose normal on gross exam Respiratory system: Bilateral equal air entry, normal vesicular breath sounds,  no wheezes or crackles  Cardiovascular system: S1 & S2 heard, RRR. No JVD, murmurs, rubs, gallops or clicks. No pedal edema. Gastrointestinal system: Abdomen is nondistended, soft and nontender. No organomegaly or masses felt. Normal bowel sounds heard. Central nervous system: Alert and oriented. No focal neurological deficits. Extremities: No edema, no clubbing ,no cyanosis, Skin: No rashes, lesions or ulcers,no icterus ,no  pallor    Data Reviewed: I have personally reviewed following labs and imaging studies  CBC: Recent Labs  Lab 03/31/21 1049 04/01/21 0215 04/01/21 2329  WBC 8.2 6.0 5.9  NEUTROABS  --   --  4.1  HGB 12.7* 12.8* 11.7*  HCT 38.5* 38.6* 34.7*  MCV 102.1* 100.3* 100.6*  PLT 184 151 203   Basic Metabolic Panel: Recent Labs  Lab 03/31/21 1049 04/01/21 0215 04/02/21 0527  NA 138 136 134*  K 3.8 3.3* 3.2*  CL 102 101 105  CO2 26 25 25   GLUCOSE 71 146* 98  BUN 10 8 8   CREATININE 0.90 0.69 0.72  CALCIUM 9.1 8.9 8.3*  MG  --   --  1.6*   GFR: CrCl cannot be calculated (Unknown ideal weight.). Liver Function Tests: Recent Labs  Lab 03/31/21 1049 04/01/21 0215 04/02/21 0527  AST 56* 49* 25  ALT 20 19 14   ALKPHOS 54 51 42  BILITOT 2.2* 2.0* 1.4*  PROT 7.5 7.0 5.8*  ALBUMIN 4.0 3.8 3.0*   No results for input(s): LIPASE, AMYLASE in the last 168 hours. Recent Labs  Lab 04/01/21 0215  AMMONIA 32   Coagulation Profile: No results for input(s): INR, PROTIME in the last 168 hours. Cardiac Enzymes: No results for input(s): CKTOTAL, CKMB, CKMBINDEX, TROPONINI in the last 168 hours. BNP (last 3 results) No results for input(s): PROBNP in the last 8760 hours. HbA1C: No results for input(s): HGBA1C in the last 72 hours. CBG: Recent Labs  Lab 04/01/21 2358 04/02/21 0158 04/02/21 0350 04/02/21 0549 04/02/21 0724  GLUCAP 114* 123* 102* 100* 92   Lipid Profile: No results for input(s): CHOL, HDL, LDLCALC, TRIG, CHOLHDL, LDLDIRECT in the last 72 hours. Thyroid Function Tests: Recent Labs    04/01/21 0215  TSH 2.606   Anemia Panel: Recent Labs    04/01/21 0528  VITAMINB12 692  FOLATE 12.4   Sepsis Labs: Recent Labs  Lab 03/31/21 1051 03/31/21 1437 04/01/21 0215  LATICACIDVEN 2.0* 3.5* 1.2    Recent Results (from the past 240 hour(s))  Resp Panel by RT-PCR (Flu A&B, Covid) Nasopharyngeal Swab     Status: None   Collection Time: 03/31/21 10:50 AM    Specimen: Nasopharyngeal Swab; Nasopharyngeal(NP) swabs in vial transport medium  Result Value Ref Range Status   SARS Coronavirus 2 by RT PCR NEGATIVE NEGATIVE Final    Comment: (NOTE) SARS-CoV-2 target nucleic acids are NOT DETECTED.  The SARS-CoV-2 RNA is generally detectable in upper respiratory specimens during the acute phase of infection. The lowest concentration of SARS-CoV-2 viral copies this assay can detect is 138 copies/mL. A negative result does not preclude SARS-Cov-2 infection and should not be used as the sole basis for treatment or other patient management decisions. A negative result may occur with  improper specimen collection/handling, submission of specimen other than nasopharyngeal swab, presence of viral mutation(s) within the areas targeted by this assay, and inadequate number of viral copies(<138 copies/mL). A negative result must be combined with clinical observations, patient history, and epidemiological information. The expected result is Negative.  Fact Sheet for Patients:  EntrepreneurPulse.com.au  Fact Sheet for Healthcare Providers:  IncredibleEmployment.be  This test is no t yet approved or cleared by the [place] FDA and  has been authorized for detection and/or diagnosis of SARS-CoV-2 by FDA under an Emergency Use Authorization (EUA). This EUA will remain  in effect (meaning this test can be used) for the duration of the COVID-19 declaration under Section 564(b)(1) of the Act, 21 U.S.C.section 360bbb-3(b)(1), unless the authorization is terminated  or revoked sooner.       Influenza A by PCR NEGATIVE NEGATIVE Final   Influenza B by PCR NEGATIVE NEGATIVE Final    Comment: (NOTE) The Xpert Xpress SARS-CoV-2/FLU/RSV plus assay is intended as an aid in the diagnosis of influenza from Nasopharyngeal swab specimens and should not be used as a sole basis for treatment. Nasal washings and aspirates are  unacceptable for Xpert Xpress SARS-CoV-2/FLU/RSV testing.  Fact Sheet for Patients: EntrepreneurPulse.com.au  Fact Sheet for Healthcare Providers: IncredibleEmployment.be  This test is not yet approved or cleared by the [place] FDA and has been authorized for detection and/or diagnosis of SARS-CoV-2 by FDA under an Emergency Use Authorization (EUA). This EUA will remain in effect (meaning this test can be used) for the duration of the COVID-19 declaration under Section 564(b)(1) of the Act, 21 U.S.C. section 360bbb-3(b)(1), unless the authorization is terminated or revoked.  Performed at [facility], [place] [address] [place], [place] [address]   Culture, blood (routine x 2)     Status: None (Preliminary result)   Collection Time: 04/01/21  2:50 PM   Specimen: BLOOD  Result Value Ref Range Status   Specimen Description   Final    BLOOD LEFT ANTECUBITAL Performed at [facility] [address] [place], [place] [address]    Special Requests   Final    BOTTLES DRAWN AEROBIC ONLY Blood Culture adequate volume Performed at [facility] [address] [place], [place] [address]    Culture   Final    NO GROWTH < 12 HOURS Performed at [facility] [address], [place], [place] [address]    Report Status PENDING  Incomplete  Culture, blood (routine x 2)     Status: None (Preliminary result)   Collection Time: 04/01/21  2:50 PM   Specimen: BLOOD  Result Value Ref Range Status   Specimen Description   Final    BLOOD BLOOD LEFT FOREARM Performed at [facility] [address], [place], [place] [address]    Special Requests   Final    BOTTLES DRAWN AEROBIC ONLY Blood Culture adequate volume Performed at [facility] [address], [place], [place] [address]    Culture   Final    NO GROWTH < 12 HOURS Performed at [facility] [address] [place], [place] [address]    Report Status PENDING  Incomplete         Radiology Studies: CT ABDOMEN PELVIS WO CONTRAST  Result Date: 03/31/2021 CLINICAL DATA:  Acute, non localized abdominal pain. EXAM: CT ABDOMEN AND PELVIS WITHOUT CONTRAST TECHNIQUE: Multidetector CT imaging of the abdomen and pelvis was performed following the standard protocol without IV contrast. COMPARISON:  None. FINDINGS: Lower chest: Mildly enlarged heart. Minimal bibasilar atelectasis/scarring. Hepatobiliary: Normal appearing liver. Poorly distended gallbladder. Pancreas: Unremarkable. No pancreatic ductal dilatation or surrounding inflammatory changes. Spleen: Normal in size without focal abnormality. Adrenals/Urinary Tract: The adrenal glands are not well visualized due to streak artifacts  produced by the patient's arms cysts. No gross adrenal abnormalities are seen. Multiple probable bilateral renal cysts, difficult to assess due to the streak artifacts. Tiny lower pole right renal calculus. Tiny upper pole left renal calculus. No bladder or ureteral calculi seen. Portions of the bladder and distal ureters are obscured by streak artifacts from a right prosthesis. Stomach/Bowel: Small hiatal hernia. Single proximal sigmoid colon diverticulum. No visible small bowel abnormalities. No findings suspicious for appendicitis. Vascular/Lymphatic: No significant vascular findings are present. No enlarged abdominal or pelvic lymph nodes. Reproductive: Probable moderately enlarged prostate gland, poorly visualized due to streak artifacts. Other: Bilateral hydroceles.  Left lower quadrant ostomy. Musculoskeletal: Right hip prosthesis with associated streak artifacts. Marked left hip degenerative changes. Moderate thoracolumbar scoliosis and extensive degenerative changes. IMPRESSION: 1. Limited examination with no gross acute abnormality. 2. Tiny bilateral, nonobstructing renal calculi. 3. Small hiatal hernia.  Electronically Signed   By: Claudie Revering M.D.   On: 03/31/2021 18:15   X-ray chest PA and lateral  Result Date: 04/01/2021 CLINICAL DATA:  Follow-up exam EXAM: CHEST - 2 VIEW COMPARISON:  03/31/2021 FINDINGS: Heart is borderline in size. No confluent airspace opacities or effusions. Mediastinal contours are within normal limits. No evidence of aortic aneurysm. IMPRESSION: Borderline heart size.  No active disease. Electronically Signed   By: Rolm Baptise M.D.   On: 04/01/2021 00:26   CT Head Wo Contrast  Result Date: 03/31/2021 CLINICAL DATA:  Altered mental status following a fall this morning. EXAM: CT HEAD WITHOUT CONTRAST TECHNIQUE: Contiguous axial images were obtained from the base of the skull through the vertex without intravenous contrast. COMPARISON:  None. FINDINGS: Brain: Mildly enlarged ventricles and cortical sulci. Mild patchy white matter low density in both cerebral hemispheres. No intracranial hemorrhage, mass lesion or CT evidence of acute infarction. Vascular: No hyperdense vessel or unexpected calcification. Skull: Normal. Negative for fracture or focal lesion. Sinuses/Orbits: Completely opacified right maxillary sinus with extensive mucosal thickening in the right ethmoid and frontal sinuses. Unremarkable orbits. Other: None. IMPRESSION: 1. No acute abnormality. 2. Mild diffuse cerebral atrophy. 3. Mild chronic small vessel white matter ischemic changes in both cerebral hemispheres. 4. Extensive chronic right maxillary, ethmoid and frontal sinusitis. Electronically Signed   By: Claudie Revering M.D.   On: 03/31/2021 13:33   MR BRAIN WO CONTRAST  Result Date: 03/31/2021 CLINICAL DATA:  Initial evaluation for delirium. EXAM: MRI HEAD WITHOUT CONTRAST TECHNIQUE: Multiplanar, multiecho pulse sequences of the brain and surrounding structures were obtained without intravenous contrast. COMPARISON:  Prior CT from earlier the same day. FINDINGS: Brain: Examination technically limited by  extensive motion artifact, markedly degrading multiple sequences. Generalized age-related cerebral atrophy. Scattered patchy T2/FLAIR hyperintensity within the periventricular deep white matter both cerebral hemispheres as well as the pons, most consistent with chronic small vessel ischemic disease, mild to moderate in nature. Remote right basal ganglia infarct with prominent susceptibility artifact, consistent with a chronic hemorrhagic infarct (series 26, image 33). No acute hemorrhage seen at this location on prior CT. No abnormal foci of restricted diffusion to suggest acute or subacute ischemia. Gray-white matter differentiation maintained. No encephalomalacia to suggest chronic cortical infarction. No other acute or chronic intracranial hemorrhage. No mass lesion, midline shift or mass effect. Ventricles normal size without hydrocephalus. No extra-axial fluid collection. Pituitary gland suprasellar region within normal limits. Midline structures intact. Vascular: Major intracranial vascular flow voids are grossly maintained at the skull base. Skull and upper cervical spine: Craniocervical junction within normal limits. Bone marrow  signal intensity normal. No focal marrow replacing lesion. No scalp soft tissue abnormality. Sinuses/Orbits: Globes and orbital soft tissues demonstrate no acute finding. Chronic right frontoethmoidal and maxillary sinusitis noted. Trace left mastoid effusion, of doubtful significance. Other: None. IMPRESSION: 1. Technically limited exam due to extensive motion artifact. 2. No acute intracranial abnormality. 3. Remote right basal ganglia hemorrhagic infarct. 4. Underlying moderate chronic microvascular ischemic disease. 5. Chronic right sided paranasal sinus disease. Electronically Signed   By: Jeannine Boga M.D.   On: 03/31/2021 20:02   DG Chest Portable 1 View  Result Date: 03/31/2021 CLINICAL DATA:  Altered mental status. EXAM: PORTABLE CHEST 1 VIEW COMPARISON:  None.  FINDINGS: Numerous leads and wires project over the chest. Mild right hemidiaphragm elevation. Midline trachea. Normal heart size. Mildly prominent transverse aorta. No pleural effusion or pneumothorax. Clear lungs. IMPRESSION: No acute cardiopulmonary disease. Mildly prominent transverse aorta, possibly related AP portable technique. Consider nonemergent follow-up with PA and lateral radiographs. Electronically Signed   By: Abigail Miyamoto M.D.   On: 03/31/2021 11:49        Scheduled Meds:  enalapril  20 mg Oral BID   enoxaparin (LOVENOX) injection  40 mg Subcutaneous Q24H   pantoprazole  40 mg Oral Daily   rosuvastatin  20 mg Oral QHS   Continuous Infusions:  dextrose 10 % and 0.45 % NaCl 75 mL/hr at 04/01/21 2245     LOS: 1 day    Time spent: 25 mins.More than 50% of that time was spent in counseling and/or coordination of care.      Shelly Coss, MD [facility] P6/13/2022, 10:06 AM

## 2021-04-03 DIAGNOSIS — G9341 Metabolic encephalopathy: Secondary | ICD-10-CM | POA: Diagnosis not present

## 2021-04-03 DIAGNOSIS — C9510 Chronic leukemia of unspecified cell type not having achieved remission: Secondary | ICD-10-CM | POA: Insufficient documentation

## 2021-04-03 DIAGNOSIS — I10 Essential (primary) hypertension: Secondary | ICD-10-CM | POA: Insufficient documentation

## 2021-04-03 DIAGNOSIS — R600 Localized edema: Secondary | ICD-10-CM | POA: Insufficient documentation

## 2021-04-03 DIAGNOSIS — I6930 Unspecified sequelae of cerebral infarction: Secondary | ICD-10-CM | POA: Insufficient documentation

## 2021-04-03 LAB — GLUCOSE, CAPILLARY
Glucose-Capillary: 102 mg/dL — ABNORMAL HIGH (ref 70–99)
Glucose-Capillary: 102 mg/dL — ABNORMAL HIGH (ref 70–99)
Glucose-Capillary: 106 mg/dL — ABNORMAL HIGH (ref 70–99)
Glucose-Capillary: 107 mg/dL — ABNORMAL HIGH (ref 70–99)
Glucose-Capillary: 126 mg/dL — ABNORMAL HIGH (ref 70–99)
Glucose-Capillary: 127 mg/dL — ABNORMAL HIGH (ref 70–99)
Glucose-Capillary: 132 mg/dL — ABNORMAL HIGH (ref 70–99)
Glucose-Capillary: 152 mg/dL — ABNORMAL HIGH (ref 70–99)
Glucose-Capillary: 161 mg/dL — ABNORMAL HIGH (ref 70–99)
Glucose-Capillary: 77 mg/dL (ref 70–99)
Glucose-Capillary: 86 mg/dL (ref 70–99)
Glucose-Capillary: 89 mg/dL (ref 70–99)

## 2021-04-03 LAB — BASIC METABOLIC PANEL
Anion gap: 6 (ref 5–15)
BUN: 9 mg/dL (ref 8–23)
CO2: 25 mmol/L (ref 22–32)
Calcium: 8.5 mg/dL — ABNORMAL LOW (ref 8.9–10.3)
Chloride: 106 mmol/L (ref 98–111)
Creatinine, Ser: 0.83 mg/dL (ref 0.61–1.24)
GFR, Estimated: >60 mL/min (ref >60)
Glucose, Bld: 98 mg/dL (ref 70–99)
Potassium: 3.5 mmol/L (ref 3.5–5.1)
Sodium: 137 mmol/L (ref 135–145)

## 2021-04-03 LAB — C-PEPTIDE: C-Peptide: 3.2 ng/mL (ref 1.1–4.4)

## 2021-04-03 LAB — URINE CULTURE

## 2021-04-03 MED ORDER — PREDNISONE 50 MG PO TABS
50.0000 mg | ORAL_TABLET | Freq: Every day | ORAL | Status: DC
  Administered 2021-04-03 – 2021-04-06 (×4): 50 mg via ORAL
  Filled 2021-04-03 (×4): qty 1

## 2021-04-03 MED ORDER — SULFAMETHOXAZOLE-TRIMETHOPRIM 800-160 MG PO TABS
1.0000 | ORAL_TABLET | ORAL | Status: DC
  Administered 2021-04-03 – 2021-04-06 (×2): 1 via ORAL
  Filled 2021-04-03 (×2): qty 1

## 2021-04-03 MED ORDER — VALACYCLOVIR HCL 500 MG PO TABS
500.0000 mg | ORAL_TABLET | Freq: Two times a day (BID) | ORAL | Status: DC
  Administered 2021-04-03 – 2021-04-06 (×7): 500 mg via ORAL
  Filled 2021-04-03 (×7): qty 1

## 2021-04-03 MED ORDER — IBRUTINIB 420 MG PO TABS
420.0000 mg | ORAL_TABLET | Freq: Every day | ORAL | Status: DC
  Administered 2021-04-03 – 2021-04-06 (×4): 420 mg via ORAL

## 2021-04-03 NOTE — TOC Initial Note (Signed)
Transition of Care [facility]) - Initial/Assessment Note    Patient Details  Name: Aaron Gonzalez MRN: [id number] Date of Birth: [birth year]  [facility] CM/SW Contact:    Aaron Mage, LCSW Phone Number: 04/03/2021, 3:00 PM  Clinical Narrative:   Patient seen in follow up to PT recommendation of [place] PT, OT recommendation of CIR.  I had reached out to CIR and was told he was not eligible for that service, and I explained this to the patient and sister.  He has all needed DME at home, has a PCP-Aaron Gonzalez-and is in need of [place] PT.  Aaron Gonzalez with [facility] agreed to take patient for [facility] PT. No further needs identified. TOC will continue to follow during the course of hospitalization.                 Expected Discharge Plan: [place] Barriers to Discharge: No Barriers Identified   Patient Goals and CMS Choice     Choice offered to / list presented to : Patient, Sibling  Expected Discharge Plan and Services Expected Discharge Plan: [place]   Discharge Planning Services: CM Consult Post Acute Care Choice: [place] arrangements for the past 2 months: Single Family Home                                      Prior Living Arrangements/Services Living arrangements for the past 2 months: Single Family Home Lives with:: Relatives (sister) Patient language and need for interpreter reviewed:: Yes        Need for Family Participation in Patient Care: Yes (Comment) Care giver support system in place?: Yes (comment) Current home services: DME Criminal Activity/Legal Involvement Pertinent to Current Situation/Hospitalization: No - Comment as needed  Activities of Daily Living Home Assistive Devices/Equipment: Walker (specify type) ADL Screening (condition at time of admission) Patient's cognitive ability adequate to safely complete daily activities?: No Is the patient deaf or have difficulty hearing?: Yes Does the patient have  difficulty seeing, even when wearing glasses/contacts?: No Does the patient have difficulty concentrating, remembering, or making decisions?: Yes Patient able to express need for assistance with ADLs?: No Does the patient have difficulty dressing or bathing?: Yes Independently performs ADLs?: No Does the patient have difficulty walking or climbing stairs?: Yes Weakness of Legs: Both Weakness of Arms/Hands: Both  Permission Sought/Granted Permission sought to share information with : Family Supports Permission granted to share information with : Yes, Release of Information Signed  Share Information with NAME: Aaron Gonzalez (Sister)   [phone]           Emotional Assessment Appearance:: Appears stated age Attitude/Demeanor/Rapport: Engaged Affect (typically observed): Appropriate Orientation: : Oriented to Self, Oriented to Situation Alcohol / Substance Use: Not Applicable Psych Involvement: No (comment)  Admission diagnosis:  Hypoglycemia [E16.2] Altered mental status, unspecified altered mental status type [V69.45] Acute metabolic encephalopathy [W38.88] Patient Active Problem List   Diagnosis Date Noted   Acute metabolic encephalopathy 28/00/3491   PCP:  Pcp, No Pharmacy:   [facility] [place], [address] [place] [address] Phone: [phone] Fax: [phone]     Social Determinants of Health (SDOH) Interventions    Readmission Risk Interventions No flowsheet data found.

## 2021-04-03 NOTE — Progress Notes (Signed)
Spoke to patient's pcp in [place] regarding patient's prescription for bactrim. PCP not aware of this prescription.

## 2021-04-03 NOTE — Progress Notes (Signed)
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  Found to be hypoglycemic on presentation.  Assessment & Plan:   Active Problems:   Acute metabolic encephalopathy  Acute metabolic encephalopathy: Thought to be secondary to hypoglycemia.  Patient was also hypertensive on presentation so hypertensive emergency could also be additional factor.  No source of infection: UA unremarkable, chest x-ray Pneumonia.  MRI of the brain did not show any acute intracranial normalities.  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  Ammonia level normal. Mental status improved after correcting hypoglycemia.  Currently he is alert and oriented.  Hypoglycemia: Unclear etiology.  Patient was having poor oral intake at home.  CT abdomen did not show any abnormality in the pancreas.  C-peptide normal  ,proinsulin, insulin, sulfonylurea levels pending Also thought to be from possibly Bactrim induced hypoglycemia IV fluids discontinued.  Patient has been started on prednisone.  Continue to monitor CBGs  History of leukemia: Unclear detail.  He was following with Dr. Arsenio Loader at [facility].  On ibrutinib, Valtrex, Bactrim.  Patient has not established care with oncology locally.  He has a plan to follow-up at [facility] for leukemia.  We can also refer him to the oncology department over here.  I have requested the family to call the oncologist at [facility] to get the refills for his ibrutinib. He has history of colon cancer 20 years ago, s/p  colostomy.  Hypokalemia/hypomagnesemia: Supplemented with potassium and magnesium.  Generalized weakness/failure to thrive: PT/OT consulted and recommended HH.  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          DVT prophylaxis:Lovenox Code Status: Full Family Communication: Called and discussed with sister on phone Status is: Inpatient  Remains inpatient appropriate because:Unsafe d/c plan  Dispo: The patient is from: Home              Anticipated d/c is to: Home              Patient currently is not medically stable to d/c.   Difficult to place patient No      Consultants: None  Procedures:None  Antimicrobials:  Anti-infectives (From admission, onward)    None       Subjective:  Patient seen and examined the bedside this morning.  Hemodynamically stable.  Comfortable.  Alert and oriented.  Denies any new complaints today.    Objective: Vitals:   04/02/21 1939 04/03/21 0023 04/03/21 0617 04/03/21 0636  BP: (!) 165/97  (!) 179/104 (!) 161/92  Pulse: 75  66 69  Resp: 18  18   Temp: 98.6 F (37 C)  98.4 F (36.9 C)   TempSrc: Oral  Oral   SpO2: 97%  100%   Weight:  69.6 kg    Height:  5\' 5"  (1.651 m)      Intake/Output Summary (Last 24 hours) at 04/03/2021 0821 Last data filed at 04/03/2021 0300 Gross per 24 hour  Intake 1640.95 ml  Output 1000 ml  Net 640.95 ml   Filed Weights   03/31/21 2228 04/03/21 0023  Weight: 70.2 kg 69.6  kg    Examination:   General exam: Overall comfortable, not in distress,obese HEENT: PERRL Respiratory system:  no wheezes or crackles  Cardiovascular system: S1 & S2 heard, RRR.  Gastrointestinal system: Abdomen is nondistended, soft and nontender. Central nervous system: Alert and oriented, chronic left-sided weakness Extremities: No edema, no clubbing ,no cyanosis, Skin: No rashes, no ulcers,no icterus      Data Reviewed: I have personally reviewed following labs and imaging studies  CBC: Recent  Labs  Lab 03/31/21 1049 04/01/21 0215 04/01/21 2329  WBC 8.2 6.0 5.9  NEUTROABS  --   --  4.1  HGB 12.7* 12.8* 11.7*  HCT 38.5* 38.6* 34.7*  MCV 102.1* 100.3* 100.6*  PLT 184 151 903   Basic Metabolic Panel: Recent Labs  Lab 03/31/21 1049 04/01/21 0215 04/02/21 0527 04/03/21 0521  NA 138 136 134* 137  K 3.8 3.3* 3.2* 3.5  CL 102 101 105 106  CO2 26 25 25 25   GLUCOSE 71 146* 98 98  BUN 10 8 8 9   CREATININE 0.90 0.69 0.72 0.83  CALCIUM 9.1 8.9 8.3* 8.5*  MG  --   --  1.6*  --    GFR: Estimated Creatinine Clearance: 66.9 mL/min (by C-G formula based on SCr of 0.83 mg/dL). Liver Function Tests: Recent Labs  Lab 03/31/21 1049 04/01/21 0215 04/02/21 0527  AST 56* 49* 25  ALT 20 19 14   ALKPHOS 54 51 42  BILITOT 2.2* 2.0* 1.4*  PROT 7.5 7.0 5.8*  ALBUMIN 4.0 3.8 3.0*   No results for input(s): LIPASE, AMYLASE in the last 168 hours. Recent Labs  Lab 04/01/21 0215  AMMONIA 32   Coagulation Profile: No results for input(s): INR, PROTIME in the last 168 hours. Cardiac Enzymes: No results for input(s): CKTOTAL, CKMB, CKMBINDEX, TROPONINI in the last 168 hours. BNP (last 3 results) No results for input(s): PROBNP in the last 8760 hours. HbA1C: No results for input(s): HGBA1C in the last 72 hours. CBG: Recent Labs  Lab 04/03/21 0015 04/03/21 0212 04/03/21 0427 04/03/21 0621 04/03/21 0742  GLUCAP 89 102* 102* 107* 77   Lipid Profile: No results for input(s): CHOL, HDL, LDLCALC, TRIG, CHOLHDL, LDLDIRECT in the last 72 hours. Thyroid Function Tests: Recent Labs    04/01/21 0215  TSH 2.606   Anemia Panel: Recent Labs    04/01/21 0528  VITAMINB12 692  FOLATE 12.4   Sepsis Labs: Recent Labs  Lab 03/31/21 1051 03/31/21 1437 04/01/21 0215  LATICACIDVEN 2.0* 3.5* 1.2    Recent Results (from the past 240 hour(s))  Resp Panel by RT-PCR (Flu A&B, Covid) Nasopharyngeal Swab     Status: None   Collection Time: 03/31/21 10:50 AM   Specimen:  Nasopharyngeal Swab; Nasopharyngeal(NP) swabs in vial transport medium  Result Value Ref Range Status   SARS Coronavirus 2 by RT PCR NEGATIVE NEGATIVE Final    Comment: (NOTE) SARS-CoV-2 target nucleic acids are NOT DETECTED.  The SARS-CoV-2 RNA is generally detectable in upper respiratory specimens during the acute phase of infection. The lowest concentration of SARS-CoV-2 viral copies this assay can detect is 138 copies/mL. A negative result does not preclude SARS-Cov-2 infection and should not be used as the sole basis for treatment or other patient management decisions. A negative result may occur with  improper specimen collection/handling, submission of specimen other than nasopharyngeal swab, presence of viral mutation(s) within the areas targeted by this assay, and inadequate number of viral copies(<138 copies/mL). A negative result must be combined with  clinical observations, patient history, and epidemiological information. The expected result is Negative.  Fact Sheet for Patients:  EntrepreneurPulse.com.au  Fact Sheet for Healthcare Providers:  IncredibleEmployment.be  This test is no t yet approved or cleared by the [place] FDA and  has been authorized for detection and/or diagnosis of SARS-CoV-2 by FDA under an Emergency Use Authorization (EUA). This EUA will remain  in effect (meaning this test can be used) for the duration of the COVID-19 declaration under Section 564(b)(1) of the Act, 21 U.S.C.section 360bbb-3(b)(1), unless the authorization is terminated  or revoked sooner.       Influenza A by PCR NEGATIVE NEGATIVE Final   Influenza B by PCR NEGATIVE NEGATIVE Final    Comment: (NOTE) The Xpert Xpress SARS-CoV-2/FLU/RSV plus assay is intended as an aid in the diagnosis of influenza from Nasopharyngeal swab specimens and should not be used as a sole basis for treatment. Nasal washings and aspirates are unacceptable for  Xpert Xpress SARS-CoV-2/FLU/RSV testing.  Fact Sheet for Patients: EntrepreneurPulse.com.au  Fact Sheet for Healthcare Providers: IncredibleEmployment.be  This test is not yet approved or cleared by the [place] FDA and has been authorized for detection and/or diagnosis of SARS-CoV-2 by FDA under an Emergency Use Authorization (EUA). This EUA will remain in effect (meaning this test can be used) for the duration of the COVID-19 declaration under Section 564(b)(1) of the Act, 21 U.S.C. section 360bbb-3(b)(1), unless the authorization is terminated or revoked.  Performed at [facility], [place] [address], [place], [place] [address]   Culture, blood (routine x 2)     Status: None (Preliminary result)   Collection Time: 04/01/21  2:50 PM   Specimen: BLOOD  Result Value Ref Range Status   Specimen Description   Final    BLOOD LEFT ANTECUBITAL Performed at [place] [address], [place], [place] [address]    Special Requests   Final    BOTTLES DRAWN AEROBIC ONLY Blood Culture adequate volume Performed at [place] [address] [place], [place] [address]    Culture   Final    NO GROWTH 2 DAYS Performed at [place] [address] [place], [place] [address]    Report Status PENDING  Incomplete  Culture, blood (routine x 2)     Status: None (Preliminary result)   Collection Time: 04/01/21  2:50 PM   Specimen: BLOOD  Result Value Ref Range Status   Specimen Description   Final    BLOOD BLOOD LEFT FOREARM Performed at [place] [address], [place], [place] [address]    Special Requests   Final    BOTTLES DRAWN AEROBIC ONLY Blood Culture adequate volume Performed at [place] [address] [place], [place] [address]    Culture   Final    NO GROWTH 2 DAYS Performed at [place] [address]  [place], [place] [address]    Report Status PENDING  Incomplete         Radiology Studies: No results found.      Scheduled Meds:  enalapril  20 mg Oral BID   enoxaparin (LOVENOX) injection  40 mg Subcutaneous Q24H   pantoprazole  40 mg Oral Daily   predniSONE  50 mg Oral Q breakfast   rosuvastatin  20 mg Oral QHS   Continuous Infusions:     LOS: 2 days    Time spent: 25 mins.More than 50% of  that time was spent in counseling and/or coordination of care.      Shelly Coss, MD [facility] P6/14/2022, 8:21 AM

## 2021-04-03 NOTE — Progress Notes (Signed)
This RN spoke to patient's previous oncology office regarding faxing a referral to the [facility] to take over care for the patient. Previous oncology office in [place] is refusing to fax a referral since patient has not been seen at their office for a few months. Reaching out to Dr. Tawanna Solo and Roque Lias to see what other options patient may have in regards to getting a referral to cancer center.

## 2021-04-03 NOTE — Plan of Care (Signed)
  Problem: Clinical Measurements: Goal: Respiratory complications will improve Outcome: Progressing   Problem: Coping: Goal: Level of anxiety will decrease Outcome: Progressing   

## 2021-04-03 NOTE — Progress Notes (Signed)
Inpatient Rehab Admissions Coordinator Note:   Per LCSW  recommendations, pt was screened for CIR candidacy by Shann Medal, PT, DPT.  At this time pt does not have the medical necessity to warrant an inpatient rehab admission.  Would recommend f/u with a lower level of care per PT/OT recs.  Please contact me with questions.   Shann Medal, PT, DPT [phone] 04/03/21 1:44 PM

## 2021-04-04 DIAGNOSIS — G9341 Metabolic encephalopathy: Secondary | ICD-10-CM | POA: Diagnosis not present

## 2021-04-04 LAB — GLUCOSE, CAPILLARY
Glucose-Capillary: 100 mg/dL — ABNORMAL HIGH (ref 70–99)
Glucose-Capillary: 107 mg/dL — ABNORMAL HIGH (ref 70–99)
Glucose-Capillary: 113 mg/dL — ABNORMAL HIGH (ref 70–99)
Glucose-Capillary: 117 mg/dL — ABNORMAL HIGH (ref 70–99)
Glucose-Capillary: 82 mg/dL (ref 70–99)
Glucose-Capillary: 84 mg/dL (ref 70–99)
Glucose-Capillary: 93 mg/dL (ref 70–99)

## 2021-04-04 LAB — VITAMIN B1

## 2021-04-04 MED ORDER — AMLODIPINE BESYLATE 10 MG PO TABS
10.0000 mg | ORAL_TABLET | Freq: Every day | ORAL | Status: DC
  Administered 2021-04-04 – 2021-04-06 (×3): 10 mg via ORAL
  Filled 2021-04-04 (×3): qty 1

## 2021-04-04 NOTE — Progress Notes (Signed)
Occupational Therapy Treatment Patient Details Name: Aaron Gonzalez MRN: [id number] DOB: Nov 13, 1945 Today's Date: 04/04/2021    History of present illness Patient is a 75 y.o. male with PMH: remote colon cancer s/p colostomy 20 years ago, recently diagnosed leukemia, dementia, who recently moved to [place], [place] from [facility] following discharge from [facility] 3 weeks prior to current admission. He suffered a CVA while in [place] which prompted hospitalization.  Current admission to [facility] secondary to AMS, found to be hypoglycemic on presentation. 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   OT comments  Patient agreeable/motivated to work with OT. Patient able to demonstrate safe hand placement with sit <> stand from recliner. No loss of balance noted during functional ambulation in room at min G assist, tolerates ~75 ft before returning to chair for arrival of lunch. Patient appears more aware of safety/deficits this session acknowledging he needs to use walker as he did not have it when he fell prior to this admission. Patient and family report plan to D/C with [facility] on Friday. Will continue to follow acutely.    Follow Up Recommendations  Home health OT;Supervision/Assistance - 24 hour    Equipment Recommendations  3 in 1 bedside commode;Tub/shower bench       Precautions / Restrictions Precautions Precautions: Fall Restrictions Weight Bearing Restrictions: No       Mobility Bed Mobility               General bed mobility comments: in chair    Transfers Overall transfer level: Needs assistance Equipment used: Rolling walker (2 wheeled) Transfers: Sit to/from Stand Sit to Stand: Min guard         General transfer comment: patient able to recall safe hand placement, no physical assist to stand    Balance Overall balance assessment: History of  Falls;Needs assistance Sitting-balance support: Feet supported Sitting balance-Leahy Scale: Good     Standing balance support: Bilateral upper extremity supported Standing balance-Leahy Scale: Poor Standing balance comment: static fair, dynamic poor with UE support on walker                           ADL either performed or assessed with clinical judgement   ADL Overall ADL's : Needs assistance/impaired Eating/Feeding: Independent;Sitting                   Lower Body Dressing: Minimal assistance;Sitting/lateral leans Lower Body Dressing Details (indicate cue type and reason): patient initiates trying to don slippers hwoever having difficulty due to socks, also reports R foot has been swollen therefore just wore socks Toilet Transfer: Min Marine scientist Details (indicate cue type and reason): patient without loss of balance this session ambulating in room with rolling walker. able to recall sequence for reaching back for chair/surface when sitting.         Functional mobility during ADLs: Min guard;Rolling walker General ADL Comments: patient demonstrating good activity tolerance and balance with walker during functional ambulation in room, ambulate ~60ft before returning to recliner for lunch arrival      Cognition Arousal/Alertness: Awake/alert Behavior During Therapy: [facility] for tasks assessed/performed Overall Cognitive Status: Within Functional Limits for tasks assessed                                 General Comments: following  directions appropriately and recalling time at inpatient rehab post stroke to OT, does have intermittent word finding/needing increased time to find words (CVA March 2022)                   Pertinent Vitals/ Pain       Pain Assessment: No/denies pain         Frequency  Min 2X/week        Progress Toward Goals  OT Goals(current goals can now be found in the care plan section)   Progress towards OT goals: Progressing toward goals  Acute Rehab OT Goals Patient Stated Goal: "More therapy" to minimize pt's shuffling when walking and to ensure pt can attend to his own toileting and hygiene. OT Goal Formulation: With family Time For Goal Achievement: 04/16/21 Potential to Achieve Goals: Good ADL Goals Pt Will Perform Grooming: with modified independence;standing Pt Will Perform Lower Body Dressing: with modified independence;with adaptive equipment;sitting/lateral leans;sit to/from stand Pt Will Transfer to Toilet: with modified independence;ambulating (comfort height toilet) Pt Will Perform Toileting - Clothing Manipulation and hygiene: with modified independence;with adaptive equipment;sitting/lateral leans;sit to/from stand Additional ADL Goal #2: Pt will engage in 8+ min standing functional activities with supervision and without loss of balance, in order to demonstrate improved activity tolerance and balance needed to perform ADLs safely at home.  Plan Discharge plan needs to be updated    Co-evaluation                 AM-PAC OT "6 Clicks" Daily Activity     Outcome Measure   Help from another person eating meals?: None Help from another person taking care of personal grooming?: A Little Help from another person toileting, which includes using toliet, bedpan, or urinal?: A Little Help from another person bathing (including washing, rinsing, drying)?: A Little Help from another person to put on and taking off regular upper body clothing?: A Little Help from another person to put on and taking off regular lower body clothing?: A Little 6 Click Score: 19    End of Session Equipment Utilized During Treatment: Rolling walker  OT Visit Diagnosis: Unsteadiness on feet (R26.81);History of falling (Z91.81)   Activity Tolerance Patient tolerated treatment well   Patient Left in chair;with call bell/phone within reach;with family/visitor present   Nurse  Communication Mobility status        Time: 9675-9163 OT Time Calculation (min): 33 min  Charges: OT General Charges $OT Visit: 1 Visit OT Treatments $Therapeutic Activity: 23-37 mins  Delbert Phenix OT OT pager: [place] 04/04/2021, 2:42 PM

## 2021-04-04 NOTE — Progress Notes (Signed)
BP 158/102, scheduled Vasotec will be administered. Notified on-call M.Sharlet Salina that patient do not have a PRN. Will continue to monitor the patient.

## 2021-04-04 NOTE — Progress Notes (Addendum)
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  Found to be hypoglycemic on presentation.  Altered mental status was most likely from hypoglycemia which has resolved with resolution of hypoglycemia.  Patient is medically stable for discharge to home but he has no place to go.  He will be discharged to his sister's house but the sister is not ready to take him until Monday.  TOC following  Assessment & Plan:   Active Problems:   Acute metabolic encephalopathy  Acute metabolic encephalopathy: Thought to be secondary to hypoglycemia.  Patient was also hypertensive on presentation so hypertensive emergency could also be additional factor.  No source of infection: UA unremarkable, chest x-ray Pneumonia.  MRI of the brain did not show any acute intracranial normalities.  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  Ammonia level normal. Mental status improved after correcting hypoglycemia.  Currently he is alert and oriented.  Hypoglycemia: Unclear etiology.  Patient was having poor oral intake at home.  CT abdomen did not show any abnormality in the pancreas.  C-peptide normal  ,proinsulin, insulin, sulfonylurea levels pending Also thought to be from possibly Bactrim induced hypoglycemia IV fluids discontinued.  Patient has been started on prednisone.  Continue to monitor CBGs. Currently stable  History of leukemia: Unclear detail.  He was following with Dr. Arsenio Loader at [facility].  On ibrutinib, Valtrex, Bactrim.  Patient has not established care with oncology locally.    We have sent message to oncology to arrange follow-up as an outpatient here .  I have requested the family to call the oncologist at [facility] to get the refills for his ibrutinib. He has history of colon cancer 20 years ago, s/p colostomy.  Hypokalemia/hypomagnesemia: Supplemented with potassium and magnesium.  Hypertension: He is hypertensive today.  We have restarted home medicine enalapril.  Added amlodipine 10 mg daily  Generalized weakness/failure to thrive: PT/OT consulted and recommended HH.  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         DVT prophylaxis:Lovenox Code Status: Full Family Communication: Called and discussed with sister on phone Status is: Inpatient  Remains inpatient appropriate because:Unsafe d/c plan  Dispo: The patient is from: Home              Anticipated d/c is to: Home              Patient currently is not medically stable to d/c.   Difficult to place patient No      Consultants: None  Procedures:None  Antimicrobials:  Anti-infectives (From admission, onward)    Start     Dose/Rate Route Frequency Ordered Stop   04/03/21 1830  sulfamethoxazole-trimethoprim (BACTRIM DS) 800-160 MG per tablet 1 tablet        1 tablet Oral Once per day on Mon Wed Fri 04/03/21 1731     04/03/21 1230  valACYclovir (VALTREX) tablet 500 mg        500 mg Oral Every 12 hours 04/03/21 1141         Subjective:  Patient seen and examined at bedside this morning.  Hemodynamically stable.  Comfortable.  Alert and oriented.  Denies any complaints    Objective: Vitals:   04/03/21 0636 04/03/21 2001 04/03/21 2016 04/04/21 0613  BP: (!) 161/92 (!) 164/112 (!) 167/88 (!) 186/96  Pulse: 69 72 68 (!) 58  Resp:  18  18  Temp:  98.6 F (37 C)  98.1 F (36.7 C)  TempSrc:  Oral    SpO2:  97% 100% 98%  Weight:      Height:        Intake/Output Summary (Last 24 hours) at 04/04/2021 1039 Last data filed at 04/04/2021 0600 Gross per 24 hour  Intake 240 ml   Output 600 ml  Net -360 ml   Filed Weights   03/31/21 2228 04/03/21 0023  Weight: 70.2 kg 69.6 kg    Examination:   General exam: Overall comfortable, not in distress HEENT: PERRL Respiratory system:  no wheezes or crackles  Cardiovascular system: S1 & S2 heard, RRR.  Gastrointestinal system: Abdomen is nondistended, soft and nontender. Central nervous system: Alert and oriented, left-sided residual weakness Extremities: No edema, no clubbing ,no cyanosis Skin: No rashes, no ulcers,no icterus    Data Reviewed: I have personally reviewed following labs and imaging studies  CBC: Recent Labs  Lab 03/31/21 1049 04/01/21 0215 04/01/21 2329  WBC 8.2 6.0 5.9  NEUTROABS  --   --  4.1  HGB 12.7* 12.8* 11.7*  HCT 38.5* 38.6* 34.7*  MCV 102.1* 100.3* 100.6*  PLT 184 151 488   Basic Metabolic Panel: Recent Labs  Lab 03/31/21 1049 04/01/21 0215 04/02/21 0527 04/03/21 0521  NA 138 136 134* 137  K 3.8 3.3* 3.2* 3.5  CL 102 101 105 106  CO2 26 25 25 25   GLUCOSE 71 146* 98 98  BUN 10 8 8 9   CREATININE 0.90 0.69 0.72 0.83  CALCIUM 9.1 8.9 8.3* 8.5*  MG  --   --  1.6*  --    GFR: Estimated Creatinine Clearance: 66.9 mL/min (by C-G formula based on SCr of 0.83 mg/dL). Liver Function Tests: Recent Labs  Lab 03/31/21 1049 04/01/21 0215 04/02/21 0527  AST 56* 49* 25  ALT 20 19 14   ALKPHOS 54 51 42  BILITOT 2.2* 2.0* 1.4*  PROT 7.5 7.0 5.8*  ALBUMIN 4.0 3.8 3.0*   No results for input(s): LIPASE, AMYLASE in the last 168 hours. Recent Labs  Lab 04/01/21 0215  AMMONIA 32   Coagulation Profile: No results for input(s): INR, PROTIME in the last 168 hours. Cardiac Enzymes: No results for input(s): CKTOTAL, CKMB, CKMBINDEX, TROPONINI in the last 168 hours. BNP (last 3 results) No results for input(s): PROBNP in the last 8760 hours. HbA1C: No results for input(s): HGBA1C in the last 72 hours. CBG: Recent Labs  Lab 04/04/21 0005 04/04/21 0207 04/04/21 0407  04/04/21 0616 04/04/21 0719  GLUCAP 113* 107* 100* 93 84   Lipid Profile: No results for input(s): CHOL, HDL, LDLCALC, TRIG, CHOLHDL, LDLDIRECT in the last 72 hours. Thyroid Function Tests: No results for input(s): TSH, T4TOTAL, FREET4, T3FREE, THYROIDAB in the last 72 hours.  Anemia Panel: No results for input(s): VITAMINB12, FOLATE, FERRITIN, TIBC, IRON, RETICCTPCT in the last 72 hours.  Sepsis Labs: Recent Labs  Lab 03/31/21 1051 03/31/21 1437 04/01/21 0215  LATICACIDVEN 2.0* 3.5* 1.2    Recent Results (from the past 240 hour(s))  Resp Panel by RT-PCR (Flu A&B, Covid) Nasopharyngeal Swab     Status: None   Collection Time: 03/31/21 10:50 AM   Specimen: Nasopharyngeal  Swab; Nasopharyngeal(NP) swabs in vial transport medium  Result Value Ref Range Status   SARS Coronavirus 2 by RT PCR NEGATIVE NEGATIVE Final    Comment: (NOTE) SARS-CoV-2 target nucleic acids are NOT DETECTED.  The SARS-CoV-2 RNA is generally detectable in upper respiratory specimens during the acute phase of infection. The lowest concentration of SARS-CoV-2 viral copies this assay can detect is 138 copies/mL. A negative result does not preclude SARS-Cov-2 infection and should not be used as the sole basis for treatment or other patient management decisions. A negative result may occur with  improper specimen collection/handling, submission of specimen other than nasopharyngeal swab, presence of viral mutation(s) within the areas targeted by this assay, and inadequate number of viral copies(<138 copies/mL). A negative result must be combined with clinical observations, patient history, and epidemiological information. The expected result is Negative.  Fact Sheet for Patients:  EntrepreneurPulse.com.au  Fact Sheet for Healthcare Providers:  IncredibleEmployment.be  This test is no t yet approved or cleared by the [place] FDA and  has been authorized for  detection and/or diagnosis of SARS-CoV-2 by FDA under an Emergency Use Authorization (EUA). This EUA will remain  in effect (meaning this test can be used) for the duration of the COVID-19 declaration under Section 564(b)(1) of the Act, 21 U.S.C.section 360bbb-3(b)(1), unless the authorization is terminated  or revoked sooner.       Influenza A by PCR NEGATIVE NEGATIVE Final   Influenza B by PCR NEGATIVE NEGATIVE Final    Comment: (NOTE) The Xpert Xpress SARS-CoV-2/FLU/RSV plus assay is intended as an aid in the diagnosis of influenza from Nasopharyngeal swab specimens and should not be used as a sole basis for treatment. Nasal washings and aspirates are unacceptable for Xpert Xpress SARS-CoV-2/FLU/RSV testing.  Fact Sheet for Patients: EntrepreneurPulse.com.au  Fact Sheet for Healthcare Providers: IncredibleEmployment.be  This test is not yet approved or cleared by the [place] FDA and has been authorized for detection and/or diagnosis of SARS-CoV-2 by FDA under an Emergency Use Authorization (EUA). This EUA will remain in effect (meaning this test can be used) for the duration of the COVID-19 declaration under Section 564(b)(1) of the Act, 21 U.S.C. section 360bbb-3(b)(1), unless the authorization is terminated or revoked.  Performed at [facility], [place] [address], [place], [place] [address]   Urine culture     Status: Abnormal   Collection Time: 03/31/21 10:51 AM   Specimen: Urine, Clean Catch  Result Value Ref Range Status   Specimen Description   Final    URINE, CLEAN CATCH Performed at [facility], [place] [address], [place], [place] [address]    Special Requests   Final    NONE Performed at [facility], [place] [address] [place], [place] [address]    Culture MULTIPLE SPECIES PRESENT, SUGGEST RECOLLECTION (A)  Final   Report Status 04/03/2021 FINAL  Final  Culture,  blood (routine x 2)     Status: None (Preliminary result)   Collection Time: 04/01/21  2:50 PM   Specimen: BLOOD  Result Value Ref Range Status   Specimen Description   Final    BLOOD LEFT ANTECUBITAL Performed at [place] [address], [place], [place] [address]    Special Requests   Final    BOTTLES DRAWN AEROBIC ONLY Blood Culture adequate volume Performed at [place] [address], [place], [place] [address]    Culture   Final    NO GROWTH  3 DAYS Performed at [facility], [place] [address] [place], [place] [address]    Report Status PENDING  Incomplete  Culture, blood (routine x 2)     Status: None (Preliminary result)   Collection Time: 04/01/21  2:50 PM   Specimen: BLOOD  Result Value Ref Range Status   Specimen Description   Final    BLOOD BLOOD LEFT FOREARM Performed at [place] [address] [place], [place] [address]    Special Requests   Final    BOTTLES DRAWN AEROBIC ONLY Blood Culture adequate volume Performed at [place] [address], [place], [place] [address]    Culture   Final    NO GROWTH 3 DAYS Performed at [facility], [place] [address], [place], [place] [address]    Report Status PENDING  Incomplete         Radiology Studies: No results found.      Scheduled Meds:  amLODipine  10 mg Oral Daily   enalapril  20 mg Oral BID   enoxaparin (LOVENOX) injection  40 mg Subcutaneous Q24H   ibrutinib  420 mg Oral Daily   pantoprazole  40 mg Oral Daily   predniSONE  50 mg Oral Q breakfast   rosuvastatin  20 mg Oral QHS   sulfamethoxazole-trimethoprim  1 tablet Oral Once per day on Mon Wed Fri   valACYclovir  500 mg Oral Q12H   Continuous Infusions:     LOS: 3 days    Time spent: 25 mins.More than 50% of that time was spent in counseling and/or coordination of care.      Shelly Coss, MD [facility] P6/15/2022, 10:39  AM

## 2021-04-04 NOTE — Progress Notes (Signed)
Call from tele, patient do not have order for tele. On-call NP notified via secure chat, new order to continue tele d/t patient not d/c as planned and will remain in the hospital at until [facility].

## 2021-04-04 NOTE — Care Management Important Message (Signed)
Important Message  Patient Details IM Letter given to the Patient. Name: Aaron Gonzalez MRN: [id number] Date of Birth: 07/24/1946   Medicare Important Message Given:  Yes     Kerin Salen 04/04/2021, 11:17 AM

## 2021-04-04 NOTE — TOC Progression Note (Signed)
Transition of Care [facility]) - Progression Note    Patient Details  Name: Aaron Gonzalez MRN: [id number] Date of Birth: 05-24-1946  [facility] CM/SW [place], [place] Phone Number: 04/04/2021, 2:24 PM  Clinical Narrative:   Spoke with sister and explained need to move patient ASAP.  Because he was not staying with her prior to admission, and family in [place] misrepresented to family here what he is able and not able to do, she is having to scramble to assure a safe set up in the home.  This includes having furniture switched from one floor to another, and is dependent on family members for this.  She has agreed to step up time table as much as possible, and is willing to commit to Friday d/c date rather than Monday.  She also requested transfer bench and 3 in 1.  Orders seen and appreciated.  [place] to request delivery of DME items. TOC will continue to follow during the course of hospitalization.     Expected Discharge Plan: [place] Barriers to Discharge: No Barriers Identified  Expected Discharge Plan and Services Expected Discharge Plan: [place]   Discharge Planning Services: CM Consult Post Acute Care Choice: [place] arrangements for the past 2 months: Single Family Home                                       Social Determinants of Health (SDOH) Interventions    Readmission Risk Interventions No flowsheet data found.

## 2021-04-05 DIAGNOSIS — G9341 Metabolic encephalopathy: Secondary | ICD-10-CM | POA: Diagnosis not present

## 2021-04-05 LAB — GLUCOSE, CAPILLARY
Glucose-Capillary: 103 mg/dL — ABNORMAL HIGH (ref 70–99)
Glucose-Capillary: 116 mg/dL — ABNORMAL HIGH (ref 70–99)
Glucose-Capillary: 121 mg/dL — ABNORMAL HIGH (ref 70–99)
Glucose-Capillary: 128 mg/dL — ABNORMAL HIGH (ref 70–99)
Glucose-Capillary: 84 mg/dL (ref 70–99)
Glucose-Capillary: 89 mg/dL (ref 70–99)

## 2021-04-05 MED ORDER — LABETALOL HCL 5 MG/ML IV SOLN
10.0000 mg | Freq: Once | INTRAVENOUS | Status: AC
  Administered 2021-04-05: 10 mg via INTRAVENOUS
  Filled 2021-04-05: qty 4

## 2021-04-05 MED ORDER — HYDRALAZINE HCL 20 MG/ML IJ SOLN
10.0000 mg | Freq: Four times a day (QID) | INTRAMUSCULAR | Status: DC | PRN
  Administered 2021-04-05: 10 mg via INTRAVENOUS
  Filled 2021-04-05: qty 1

## 2021-04-05 NOTE — Progress Notes (Signed)
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  Found to be hypoglycemic on presentation.  Altered mental status was most likely from hypoglycemia which has resolved with resolution of hypoglycemia.  Patient is medically stable for discharge to home but he has no place to go.  He will be discharged to his sister's house but the sister is not ready to take him until tomorrow.  TOC following  Assessment & Plan:   Active Problems:   Acute metabolic encephalopathy  Acute metabolic encephalopathy: Thought to be secondary to hypoglycemia.  Patient was also hypertensive on presentation so hypertensive emergency could also be additional factor.  No source of infection: UA unremarkable, chest x-ray Pneumonia.  MRI of the brain did not show any acute intracranial normalities.  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  Ammonia level normal. Mental status improved after correcting hypoglycemia.  Currently he is alert and oriented.  Hypoglycemia: Unclear etiology.  Patient was having poor oral intake at home.  CT abdomen did not show any abnormality in the pancreas.  C-peptide normal  ,proinsulin, insulin, sulfonylurea levels pending Also thought to be from possibly Bactrim induced hypoglycemia IV fluids discontinued.  Patient has been started on prednisone.  Continue to monitor CBGs. Currently stable  History of leukemia: Unclear detail.  He was following with Dr. Arsenio Loader at [facility].  On ibrutinib, Valtrex, Bactrim.  Patient has not established care with oncology locally.    We have sent message to oncology to arrange follow-up as an outpatient here . Outpatient follow up will be arranged. I have requested the family to call the oncologist at [facility] to get the refills for his ibrutinib. He has history of colon cancer 20 years ago, s/p colostomy.  Hypokalemia/hypomagnesemia: Supplemented with potassium and magnesium.  Hypertension: He is hypertensive today.  We have restarted home medicine enalapril.  Added amlodipine 10 mg daily  Generalized weakness/failure to thrive: PT/OT consulted and recommended HH.  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         DVT prophylaxis:Lovenox Code Status: Full Family Communication: Called and discussed with sister on phone Status is: Inpatient  Remains inpatient appropriate because:Unsafe d/c plan  Dispo: The patient is from: Home              Anticipated d/c is to: Home              Patient currently is not medically stable to d/c.   Difficult to place patient No      Consultants: None  Procedures:None  Antimicrobials:  Anti-infectives (From admission, onward)    Start     Dose/Rate Route Frequency Ordered Stop   04/03/21 1830  sulfamethoxazole-trimethoprim (BACTRIM DS) 800-160 MG per tablet 1 tablet        1 tablet Oral Once per day on Mon Wed Fri 04/03/21 1731     04/03/21 1230  valACYclovir (VALTREX) tablet 500 mg        500 mg Oral Every 12 hours 04/03/21 1141         Subjective:  Patient seen and examined the bedside  this morning.  Medically stable.  Comfortable.  Eating his breakfast.  Denies any complaints    Objective: Vitals:   04/04/21 2044 04/05/21 0008 04/05/21 0153 04/05/21 0522  BP: (!) 158/102 (!) 160/100 (!) 152/99 (!) 142/88  Pulse: 68 75 72 68  Resp: 20   18  Temp: 98.8 F (37.1 C)   98.8 F (37.1 C)  TempSrc: Oral   Oral  SpO2: 99%  98% 100%  Weight:      Height:        Intake/Output Summary (Last 24 hours) at 04/05/2021 1312 Last data filed at 04/05/2021  0944 Gross per 24 hour  Intake 240 ml  Output 800 ml  Net -560 ml   Filed Weights   03/31/21 2228 04/03/21 0023  Weight: 70.2 kg 69.6 kg    Examination:   General exam: Overall comfortable, not in distress HEENT: PERRL Respiratory system:  no wheezes or crackles  Cardiovascular system: S1 & S2 heard, RRR.  Gastrointestinal system: Abdomen is nondistended, soft and nontender. Central nervous system: Alert and oriented, chronic left-sided weakness Extremities: No edema, no clubbing ,no cyanosis Skin: No rashes, no ulcers,no icterus    Data Reviewed: I have personally reviewed following labs and imaging studies  CBC: Recent Labs  Lab 03/31/21 1049 04/01/21 0215 04/01/21 2329  WBC 8.2 6.0 5.9  NEUTROABS  --   --  4.1  HGB 12.7* 12.8* 11.7*  HCT 38.5* 38.6* 34.7*  MCV 102.1* 100.3* 100.6*  PLT 184 151 157   Basic Metabolic Panel: Recent Labs  Lab 03/31/21 1049 04/01/21 0215 04/02/21 0527 04/03/21 0521  NA 138 136 134* 137  K 3.8 3.3* 3.2* 3.5  CL 102 101 105 106  CO2 26 25 25 25   GLUCOSE 71 146* 98 98  BUN 10 8 8 9   CREATININE 0.90 0.69 0.72 0.83  CALCIUM 9.1 8.9 8.3* 8.5*  MG  --   --  1.6*  --    GFR: Estimated Creatinine Clearance: 66.9 mL/min (by C-G formula based on SCr of 0.83 mg/dL). Liver Function Tests: Recent Labs  Lab 03/31/21 1049 04/01/21 0215 04/02/21 0527  AST 56* 49* 25  ALT 20 19 14   ALKPHOS 54 51 42  BILITOT 2.2* 2.0* 1.4*  PROT 7.5 7.0 5.8*  ALBUMIN 4.0 3.8 3.0*   No results for input(s): LIPASE, AMYLASE in the last 168 hours. Recent Labs  Lab 04/01/21 0215  AMMONIA 32   Coagulation Profile: No results for input(s): INR, PROTIME in the last 168 hours. Cardiac Enzymes: No results for input(s): CKTOTAL, CKMB, CKMBINDEX, TROPONINI in the last 168 hours. BNP (last 3 results) No results for input(s): PROBNP in the last 8760 hours. HbA1C: No results for input(s): HGBA1C in the last 72 hours. CBG: Recent Labs  Lab  04/04/21 1835 04/05/21 0010 04/05/21 0520 04/05/21 0737 04/05/21 1215  GLUCAP 117* 103* 89 84 116*   Lipid Profile: No results for input(s): CHOL, HDL, LDLCALC, TRIG, CHOLHDL, LDLDIRECT in the last 72 hours. Thyroid Function Tests: No results for input(s): TSH, T4TOTAL, FREET4, T3FREE, THYROIDAB in the last 72 hours.  Anemia Panel: No results for input(s): VITAMINB12, FOLATE, FERRITIN, TIBC, IRON, RETICCTPCT in the last 72 hours.  Sepsis Labs: Recent Labs  Lab 03/31/21 1051 03/31/21 1437 04/01/21 0215  LATICACIDVEN 2.0* 3.5* 1.2    Recent Results (from the past 240 hour(s))  Resp Panel by RT-PCR (Flu A&B, Covid) Nasopharyngeal Swab     Status: None   Collection Time: 03/31/21 10:50  AM   Specimen: Nasopharyngeal Swab; Nasopharyngeal(NP) swabs in vial transport medium  Result Value Ref Range Status   SARS Coronavirus 2 by RT PCR NEGATIVE NEGATIVE Final    Comment: (NOTE) SARS-CoV-2 target nucleic acids are NOT DETECTED.  The SARS-CoV-2 RNA is generally detectable in upper respiratory specimens during the acute phase of infection. The lowest concentration of SARS-CoV-2 viral copies this assay can detect is 138 copies/mL. A negative result does not preclude SARS-Cov-2 infection and should not be used as the sole basis for treatment or other patient management decisions. A negative result may occur with  improper specimen collection/handling, submission of specimen other than nasopharyngeal swab, presence of viral mutation(s) within the areas targeted by this assay, and inadequate number of viral copies(<138 copies/mL). A negative result must be combined with clinical observations, patient history, and epidemiological information. The expected result is Negative.  Fact Sheet for Patients:  EntrepreneurPulse.com.au  Fact Sheet for Healthcare Providers:  IncredibleEmployment.be  This test is no t yet approved or cleared by the [place] FDA and  has been authorized for detection and/or diagnosis of SARS-CoV-2 by FDA under an Emergency Use Authorization (EUA). This EUA will remain  in effect (meaning this test can be used) for the duration of the COVID-19 declaration under Section 564(b)(1) of the Act, 21 U.S.C.section 360bbb-3(b)(1), unless the authorization is terminated  or revoked sooner.       Influenza A by PCR NEGATIVE NEGATIVE Final   Influenza B by PCR NEGATIVE NEGATIVE Final    Comment: (NOTE) The Xpert Xpress SARS-CoV-2/FLU/RSV plus assay is intended as an aid in the diagnosis of influenza from Nasopharyngeal swab specimens and should not be used as a sole basis for treatment. Nasal washings and aspirates are unacceptable for Xpert Xpress SARS-CoV-2/FLU/RSV testing.  Fact Sheet for Patients: EntrepreneurPulse.com.au  Fact Sheet for Healthcare Providers: IncredibleEmployment.be  This test is not yet approved or cleared by the [place] FDA and has been authorized for detection and/or diagnosis of SARS-CoV-2 by FDA under an Emergency Use Authorization (EUA). This EUA will remain in effect (meaning this test can be used) for the duration of the COVID-19 declaration under Section 564(b)(1) of the Act, 21 U.S.C. section 360bbb-3(b)(1), unless the authorization is terminated or revoked.  Performed at [facility], [place] [address], [place], [place] [address]   Urine culture     Status: Abnormal   Collection Time: 03/31/21 10:51 AM   Specimen: Urine, Clean Catch  Result Value Ref Range Status   Specimen Description   Final    URINE, CLEAN CATCH Performed at [facility], [place] [address] [place], [place] [address]    Special Requests   Final    NONE Performed at [facility], [place] [address] [place], [place] [address]    Culture MULTIPLE SPECIES PRESENT, SUGGEST RECOLLECTION (A)  Final   Report  Status 04/03/2021 FINAL  Final  Culture, blood (routine x 2)     Status: None (Preliminary result)   Collection Time: 04/01/21  2:50 PM   Specimen: BLOOD  Result Value Ref Range Status   Specimen Description   Final    BLOOD LEFT ANTECUBITAL Performed at [facility] [address], [place], [place] [address]    Special Requests   Final    BOTTLES DRAWN AEROBIC ONLY Blood Culture adequate volume Performed at [place] [address], [place], [place] [address]    Culture   Final  NO GROWTH 4 DAYS Performed at [facility], [place] [address], [place], [place] [address]    Report Status PENDING  Incomplete  Culture, blood (routine x 2)     Status: None (Preliminary result)   Collection Time: 04/01/21  2:50 PM   Specimen: BLOOD  Result Value Ref Range Status   Specimen Description   Final    BLOOD BLOOD LEFT FOREARM Performed at [place] [address], [place], [place] [address]    Special Requests   Final    BOTTLES DRAWN AEROBIC ONLY Blood Culture adequate volume Performed at [facility] [address], [place], [place] [address]    Culture   Final    NO GROWTH 4 DAYS Performed at [facility], [place] [address] [place], [place] [address]    Report Status PENDING  Incomplete         Radiology Studies: No results found.      Scheduled Meds:  amLODipine  10 mg Oral Daily   enalapril  20 mg Oral BID   enoxaparin (LOVENOX) injection  40 mg Subcutaneous Q24H   ibrutinib  420 mg Oral Daily   pantoprazole  40 mg Oral Daily   predniSONE  50 mg Oral Q breakfast   rosuvastatin  20 mg Oral QHS   sulfamethoxazole-trimethoprim  1 tablet Oral Once per day on Mon Wed Fri   valACYclovir  500 mg Oral Q12H   Continuous Infusions:     LOS: 4 days    Time spent: 25 mins.More than 50% of that time was spent in counseling and/or coordination of care.      Shelly Coss,  MD [facility] P6/16/2022, 1:12 PM

## 2021-04-05 NOTE — TOC Progression Note (Signed)
Transition of Care [facility]) - Progression Note    Patient Details  Name: Aaron Gonzalez MRN: [id number] Date of Birth: 02-22-1946  Transition of Care [facility] CM/SW [place], LCSW Phone Number: 04/05/2021, 2:21 PM  Clinical Narrative:    CSW spoke with patient's sister Shawna Orleans, who confirmed receipt of DME.Ms Cherylann Parr reported she will be ready to take patient home tomorrow. CSW continue to follow.    Expected Discharge Plan: [place] Barriers to Discharge: No Barriers Identified  Expected Discharge Plan and Services Expected Discharge Plan: [place]   Discharge Planning Services: CM Consult Post Acute Care Choice: [place] arrangements for the past 2 months: Single Family Home                                       Social Determinants of Health (SDOH) Interventions    Readmission Risk Interventions No flowsheet data found.

## 2021-04-05 NOTE — Progress Notes (Signed)
Physical Therapy Treatment Patient Details Name: Aaron Gonzalez MRN: [id number] DOB: Oct 24, 1945 Today's Date: 04/05/2021    History of Present Illness Patient is a 75 y.o. male with PMH: remote colon cancer s/p colostomy 20 years ago, recently diagnosed leukemia, dementia, who recently moved to [place], [place] from [facility] following discharge from [facility] 3 weeks prior to current admission. He suffered a CVA while in [place] which prompted hospitalization.  Current admission to [facility] secondary to AMS, found to be hypoglycemic on presentation. 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    PT Comments    Pt assisted with ambulating in hallway with RW and then performed side stepping, forwards and backwards walking without RW for balance challenge (pt enjoys line dancing).  Pt reports d/c likely tomorrow, and he anticipates working with HHPT upon d/c.    Follow Up Recommendations  Home health PT;Supervision for mobility/OOB     Equipment Recommendations  None recommended by PT    Recommendations for Other Services       Precautions / Restrictions Precautions Precautions: Fall    Mobility  Bed Mobility               General bed mobility comments: pt in recliner    Transfers Overall transfer level: Needs assistance Equipment used: Rolling walker (2 wheeled) Transfers: Sit to/from Stand Sit to Stand: Min guard         General transfer comment: patient able to recall safe hand placement, no physical assist to stand; observed posterior lean of LEs on recliner  Ambulation/Gait Ambulation/Gait assistance: Min guard Gait Distance (Feet): 500 Feet Assistive device: Rolling walker (2 wheeled) Gait Pattern/deviations: Step-through pattern;Decreased stride length;Narrow base of support Gait velocity: decreased   General Gait Details: verbal cues for  remaining inside RW, pt tends to keep RW too far forward, small short steps, no LOB with RW   Stairs             Wheelchair Mobility    Modified Rankin (Stroke Patients Only)       Balance                               High Level Balance Comments: Pt reports line dancing prior to CVA so performed bilateral side stepping, forwards, backwards without RW for balance challenges for approx 5 minutes, pt takes very small steps            Cognition Arousal/Alertness: Awake/alert Behavior During Therapy: WFL for tasks assessed/performed Overall Cognitive Status: Within Functional Limits for tasks assessed                                 General Comments: some word finding difficulty however appropriate, situational awareness, following commands      Exercises      General Comments        Pertinent Vitals/Pain Pain Assessment: No/denies pain    Home Living                      Prior Function            PT Goals (current goals can now be found in the care plan section) Progress towards PT goals: Progressing toward goals    Frequency    Min 3X/week  PT Plan Current plan remains appropriate    Co-evaluation              AM-PAC PT "6 Clicks" Mobility   Outcome Measure  Help needed turning from your back to your side while in a flat bed without using bedrails?: A Little Help needed moving from lying on your back to sitting on the side of a flat bed without using bedrails?: A Little Help needed moving to and from a bed to a chair (including a wheelchair)?: A Little Help needed standing up from a chair using your arms (e.g., wheelchair or bedside chair)?: A Little Help needed to walk in hospital room?: A Little Help needed climbing 3-5 steps with a railing? : A Little 6 Click Score: 18    End of Session Equipment Utilized During Treatment: Gait belt Activity Tolerance: Patient tolerated treatment  well Patient left: in chair;with call bell/phone within reach;with chair alarm set   PT Visit Diagnosis: Other abnormalities of gait and mobility (R26.89)     Time: 0300-9233 PT Time Calculation (min) (ACUTE ONLY): 28 min  Charges:  $Gait Training: 23-37 mins           Jannette Spanner PT, DPT Acute Rehabilitation Services Pager: [phone] Office: [place] 04/05/2021, 1:24 PM

## 2021-04-05 NOTE — Plan of Care (Signed)
  Problem: Nutrition: Goal: Adequate nutrition will be maintained Outcome: Progressing   Problem: Coping: Goal: Level of anxiety will decrease Outcome: Progressing   

## 2021-04-06 ENCOUNTER — Telehealth: Payer: Self-pay | Admitting: Internal Medicine

## 2021-04-06 DIAGNOSIS — G9341 Metabolic encephalopathy: Secondary | ICD-10-CM | POA: Diagnosis not present

## 2021-04-06 LAB — CULTURE, BLOOD (ROUTINE X 2)
Culture: NO GROWTH
Culture: NO GROWTH
Special Requests: ADEQUATE
Special Requests: ADEQUATE

## 2021-04-06 LAB — GLUCOSE, CAPILLARY
Glucose-Capillary: 96 mg/dL (ref 70–99)
Glucose-Capillary: 119 mg/dL — ABNORMAL HIGH (ref 70–99)

## 2021-04-06 MED ORDER — PREDNISONE 10 MG PO TABS: 10.0000 mg | ORAL_TABLET | Freq: Every day | ORAL | 0 refills | Status: DC

## 2021-04-06 MED ORDER — BLOOD GLUCOSE MONITOR KIT: PACK | 0 refills | Status: AC

## 2021-04-06 MED ORDER — AMLODIPINE BESYLATE 10 MG PO TABS: 10.0000 mg | ORAL_TABLET | Freq: Every day | ORAL | 1 refills | Status: DC

## 2021-04-06 NOTE — Progress Notes (Signed)
IV to LFA removed due to pain,discomfort, raised area when flushed. No orders currently continued IV Fluids. According to Case management note patient is to be discharged tomorrow. IV not replaced at this time.

## 2021-04-06 NOTE — Discharge Summary (Signed)
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  Found to be hypoglycemic on presentation.  Altered mental status was most likely from hypoglycemia which has resolved with resolution of hypoglycemia.  PT/OT recommended home health on discharge.  He is medically stable for discharge to home today.  Following problems were addressed during his hospitalization:  Acute metabolic encephalopathy: Thought to be secondary to hypoglycemia.  Patient was also hypertensive on presentation so hypertensive emergency could also be additional factor.  No source of infection: UA unremarkable, chest x-ray Pneumonia.  MRI of the brain did not show any acute intracranial normalities.  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  Ammonia level normal. Mental status improved after correcting hypoglycemia.  Currently he is alert and oriented.   Hypoglycemia: Unclear etiology.  Patient was having poor oral intake at home.  CT abdomen did not show any abnormality in the pancreas.  C-peptide normal  ,proinsulin, insulin, sulfonylurea levels pending IV fluids discontinued.  Patient has been started on prednisone,will continue prednisone for short course.  Patient has been advised to follow-up his blood glucose at home   History of leukemia: Unclear detail. Likley CLL. He was following with Dr. Arsenio Loader at [facility].  On ibrutinib, Valtrex,  Bactrim.  Patient has not established care with oncology locally.   We have sent message to oncology to arrange follow-up as an outpatient here . Outpatient follow up will be arranged as per Dr Julien Nordmann. I have requested the family to call the oncologist at [facility] to get the refills for his ibrutinib. He has history of colon cancer 20 years ago, s/p colostomy.   Hypokalemia/hypomagnesemia: Supplemented with potassium and magnesium.   Hypertension: Continue current medications  Generalized weakness/failure to thrive: PT/OT consulted and recommended HH.  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      Discharge Diagnoses:  Active Problems:   Acute metabolic encephalopathy    Discharge Instructions  Discharge Instructions     Diet - low sodium heart healthy   Complete by: As directed    Discharge instructions   Complete by: As directed    1)Please follow up with the PCP in a week 2)Monitor your blood glucose at home 3)Take prescribed  medications as instructed 4)You will be called for appointment from oncology.  Please follow-up with your previous oncologist for prescriptions of your medications until you see a new oncologist.It might take time to get an appointment   Increase activity slowly   Complete by: As directed       Allergies as of 04/06/2021   No Known Allergies      Medication List     TAKE these medications    allopurinol 300 MG tablet Commonly known as: ZYLOPRIM Take 300 mg by mouth daily.   amLODipine 10 MG tablet Commonly known as: NORVASC Take 1 tablet (10 mg total) by mouth daily. Start taking on: April 07, 2021   blood glucose meter kit and supplies Kit Dispense based on patient and  insurance preference. Use up to four times daily as directed.   enalapril 20 MG tablet Commonly known as: VASOTEC Take 20 mg by mouth 2 (two) times daily.   Imbruvica 420 MG Tabs Generic drug: ibrutinib Take 420 mg by mouth daily.   pantoprazole 40 MG  tablet Commonly known as: PROTONIX Take 40 mg by mouth daily.   predniSONE 10 MG tablet Commonly known as: DELTASONE Take 1 tablet (10 mg total) by mouth daily with breakfast. Start taking on: April 07, 2021   rosuvastatin 20 MG tablet Commonly known as: CRESTOR Take 20 mg by mouth at bedtime.   sulfamethoxazole-trimethoprim 800-160 MG tablet Commonly known as: BACTRIM DS Take 1 tablet by mouth every Monday, Wednesday, and Friday.   valACYclovir 500 MG tablet Commonly known as: VALTREX Take 500 mg by mouth every 12 (twelve) hours.               Durable Medical Equipment  (From admission, onward)           Start     Ordered   04/04/21 1542  For home use only DME Other see comment  Once       Comments: Transfer bench  Question:  Length of Need  Answer:  Lifetime   04/04/21 1541   04/04/21 1532  For home use only DME 3 n 1  Once        04/04/21 1537            Follow-up Information     Fossil, Whitney L, PA. Go on 04/07/2021.   Specialty: Physician Assistant Why: Saturday for hopsital appointment. Contact information: [address] [place] [address] [phone]         Care, [facility] Follow up.   Specialty: [place] Why: This is your home health agency that will be providing physical therapy Contact information: [address]  [place] [address] [phone]                No Known Allergies  Consultations: None   Procedures/Studies: CT ABDOMEN PELVIS WO CONTRAST  Result Date: 03/31/2021 CLINICAL DATA:  Acute, non localized abdominal pain. EXAM: CT ABDOMEN AND PELVIS WITHOUT CONTRAST TECHNIQUE: Multidetector CT imaging of the abdomen and pelvis was performed following the standard protocol without IV contrast. COMPARISON:  None. FINDINGS: Lower chest: Mildly enlarged heart. Minimal bibasilar atelectasis/scarring. Hepatobiliary: Normal appearing liver. Poorly distended gallbladder.  Pancreas: Unremarkable. No pancreatic ductal dilatation or surrounding inflammatory changes. Spleen: Normal in size without focal abnormality. Adrenals/Urinary Tract: The adrenal glands are not well visualized due to streak artifacts produced by the patient's arms cysts. No gross adrenal abnormalities are seen. Multiple probable bilateral renal cysts, difficult to assess due to the streak artifacts. Tiny lower pole right renal calculus. Tiny upper pole left renal calculus. No bladder or ureteral calculi seen. Portions of the bladder and distal ureters are obscured by streak artifacts from a right prosthesis. Stomach/Bowel: Small hiatal hernia. Single proximal sigmoid colon diverticulum. No visible small bowel abnormalities. No findings suspicious for appendicitis. Vascular/Lymphatic: No significant vascular findings are present. No enlarged abdominal or pelvic lymph nodes. Reproductive: Probable moderately enlarged prostate gland, poorly visualized due to streak artifacts. Other: Bilateral hydroceles.  Left lower quadrant ostomy. Musculoskeletal: Right hip prosthesis with associated streak artifacts. Marked left hip degenerative changes. Moderate thoracolumbar scoliosis and extensive degenerative changes. IMPRESSION: 1. Limited examination with no gross acute abnormality. 2. Tiny bilateral, nonobstructing renal calculi. 3. Small hiatal hernia.  Electronically Signed   By: Claudie Revering M.D.   On: 03/31/2021 18:15   X-ray chest PA and lateral  Result Date: 04/01/2021 CLINICAL DATA:  Follow-up exam EXAM: CHEST - 2 VIEW COMPARISON:  03/31/2021 FINDINGS: Heart is borderline in size. No confluent airspace opacities or effusions. Mediastinal contours are within normal limits. No evidence of aortic aneurysm. IMPRESSION: Borderline heart size.  No active disease. Electronically Signed   By: Rolm Baptise M.D.   On: 04/01/2021 00:26   CT Head Wo Contrast  Result Date: 03/31/2021 CLINICAL DATA:  Altered mental status  following a fall this morning. EXAM: CT HEAD WITHOUT CONTRAST TECHNIQUE: Contiguous axial images were obtained from the base of the skull through the vertex without intravenous contrast. COMPARISON:  None. FINDINGS: Brain: Mildly enlarged ventricles and cortical sulci. Mild patchy white matter low density in both cerebral hemispheres. No intracranial hemorrhage, mass lesion or CT evidence of acute infarction. Vascular: No hyperdense vessel or unexpected calcification. Skull: Normal. Negative for fracture or focal lesion. Sinuses/Orbits: Completely opacified right maxillary sinus with extensive mucosal thickening in the right ethmoid and frontal sinuses. Unremarkable orbits. Other: None. IMPRESSION: 1. No acute abnormality. 2. Mild diffuse cerebral atrophy. 3. Mild chronic small vessel white matter ischemic changes in both cerebral hemispheres. 4. Extensive chronic right maxillary, ethmoid and frontal sinusitis. Electronically Signed   By: Claudie Revering M.D.   On: 03/31/2021 13:33   MR BRAIN WO CONTRAST  Result Date: 03/31/2021 CLINICAL DATA:  Initial evaluation for delirium. EXAM: MRI HEAD WITHOUT CONTRAST TECHNIQUE: Multiplanar, multiecho pulse sequences of the brain and surrounding structures were obtained without intravenous contrast. COMPARISON:  Prior CT from earlier the same day. FINDINGS: Brain: Examination technically limited by extensive motion artifact, markedly degrading multiple sequences. Generalized age-related cerebral atrophy. Scattered patchy T2/FLAIR hyperintensity within the periventricular deep white matter both cerebral hemispheres as well as the pons, most consistent with chronic small vessel ischemic disease, mild to moderate in nature. Remote right basal ganglia infarct with prominent susceptibility artifact, consistent with a chronic hemorrhagic infarct (series 26, image 33). No acute hemorrhage seen at this location on prior CT. No abnormal foci of restricted diffusion to suggest acute  or subacute ischemia. Gray-white matter differentiation maintained. No encephalomalacia to suggest chronic cortical infarction. No other acute or chronic intracranial hemorrhage. No mass lesion, midline shift or mass effect. Ventricles normal size without hydrocephalus. No extra-axial fluid collection. Pituitary gland suprasellar region within normal limits. Midline structures intact. Vascular: Major intracranial vascular flow voids are grossly maintained at the skull base. Skull and upper cervical spine: Craniocervical junction within normal limits. Bone marrow signal intensity normal. No focal marrow replacing lesion. No scalp soft tissue abnormality. Sinuses/Orbits: Globes and orbital soft tissues demonstrate no acute finding. Chronic right frontoethmoidal and maxillary sinusitis noted. Trace left mastoid effusion, of doubtful significance. Other: None. IMPRESSION: 1. Technically limited exam due to extensive motion artifact. 2. No acute intracranial abnormality. 3. Remote right basal ganglia hemorrhagic infarct. 4. Underlying moderate chronic microvascular ischemic disease. 5. Chronic right sided paranasal sinus disease. Electronically Signed   By: Jeannine Boga M.D.   On: 03/31/2021 20:02   DG Chest Portable 1 View  Result Date: 03/31/2021 CLINICAL DATA:  Altered mental status. EXAM: PORTABLE CHEST 1 VIEW COMPARISON:  None. FINDINGS: Numerous leads and wires project over the chest. Mild right hemidiaphragm elevation. Midline trachea. Normal heart size. Mildly prominent transverse aorta. No pleural effusion or pneumothorax. Clear lungs. IMPRESSION: No acute cardiopulmonary disease. Mildly prominent transverse  aorta, possibly related AP portable technique. Consider nonemergent follow-up with PA and lateral radiographs. Electronically Signed   By: Abigail Miyamoto M.D.   On: 03/31/2021 11:49      Subjective:  Patient seen and examined the bedside this morning.  Medically stable for discharge  today.   Discharge Exam: Vitals:   04/05/21 2003 04/06/21 0442  BP: 133/80 (!) 144/84  Pulse: 74 67  Resp: 18 18  Temp: 98 F (36.7 C) 97.8 F (36.6 C)  SpO2: 98% 98%   Vitals:   04/05/21 1432 04/05/21 1854 04/05/21 2003 04/06/21 0442  BP: (!) 144/105 (!) 155/100 133/80 (!) 144/84  Pulse: 69 66 74 67  Resp: _0 Temp: 98.1 F (36.7 C)  98 F (36.7 C) 97.8 F (36.6 C)  TempSrc: Oral  Oral Oral  SpO2: 99%  98% 98%  Weight:      Height:        General: Pt is alert, awake, not in acute distress Cardiovascular: RRR, S1/S2 +, no rubs, no gallops Respiratory: CTA bilaterally, no wheezing, no rhonchi Abdominal: Soft, NT, ND, bowel sounds + Extremities: no edema, no cyanosis    The results of significant diagnostics from this hospitalization (including imaging, microbiology, ancillary and laboratory) are listed below for reference.     Microbiology: Recent Results (from the past 240 hour(s))  Resp Panel by RT-PCR (Flu A&B, Covid) Nasopharyngeal Swab     Status: None   Collection Time: 03/31/21 10:50 AM   Specimen: Nasopharyngeal Swab; Nasopharyngeal(NP) swabs in vial transport medium  Result Value Ref Range Status   SARS Coronavirus 2 by RT PCR NEGATIVE NEGATIVE Final    Comment: (NOTE) SARS-CoV-2 target nucleic acids are NOT DETECTED.  The SARS-CoV-2 RNA is generally detectable in upper respiratory specimens during the acute phase of infection. The lowest concentration of SARS-CoV-2 viral copies this assay can detect is 138 copies/mL. A negative result does not preclude SARS-Cov-2 infection and should not be used as the sole basis for treatment or other patient management decisions. A negative result may occur with  improper specimen collection/handling, submission of specimen other than nasopharyngeal swab, presence of viral mutation(s) within the areas targeted by this assay, and inadequate number of viral copies(<138 copies/mL). A negative result must be  combined with clinical observations, patient history, and epidemiological information. The expected result is Negative.  Fact Sheet for Patients:  EntrepreneurPulse.com.au  Fact Sheet for Healthcare Providers:  IncredibleEmployment.be  This test is no t yet approved or cleared by the [place] FDA and  has been authorized for detection and/or diagnosis of SARS-CoV-2 by FDA under an Emergency Use Authorization (EUA). This EUA will remain  in effect (meaning this test can be used) for the duration of the COVID-19 declaration under Section 564(b)(1) of the Act, 21 U.S.C.section 360bbb-3(b)(1), unless the authorization is terminated  or revoked sooner.       Influenza A by PCR NEGATIVE NEGATIVE Final   Influenza B by PCR NEGATIVE NEGATIVE Final    Comment: (NOTE) The Xpert Xpress SARS-CoV-2/FLU/RSV plus assay is intended as an aid in the diagnosis of influenza from Nasopharyngeal swab specimens and should not be used as a sole basis for treatment. Nasal washings and aspirates are unacceptable for Xpert Xpress SARS-CoV-2/FLU/RSV testing.  Fact Sheet for Patients: EntrepreneurPulse.com.au  Fact Sheet for Healthcare Providers: IncredibleEmployment.be  This test is not yet approved or cleared by the [place] FDA and has been authorized for detection and/or diagnosis of SARS-CoV-2  by FDA under an Emergency Use Authorization (EUA). This EUA will remain in effect (meaning this test can be used) for the duration of the COVID-19 declaration under Section 564(b)(1) of the Act, 21 U.S.C. section 360bbb-3(b)(1), unless the authorization is terminated or revoked.  Performed at [facility] [place] [address], [place], [place] [address]   Urine culture     Status: Abnormal   Collection Time: 03/31/21 10:51 AM   Specimen: Urine, Clean Catch  Result Value Ref Range Status   Specimen  Description   Final    URINE, CLEAN CATCH Performed at [facility], [place] [address], [place], [place] [address]    Special Requests   Final    NONE Performed at [facility], [place] [address] [place], [place] [address]    Culture MULTIPLE SPECIES PRESENT, SUGGEST RECOLLECTION (A)  Final   Report Status 04/03/2021 FINAL  Final  Culture, blood (routine x 2)     Status: None   Collection Time: 04/01/21  2:50 PM   Specimen: BLOOD  Result Value Ref Range Status   Specimen Description   Final    BLOOD LEFT ANTECUBITAL Performed at [place] [address], [place], [place] [address]    Special Requests   Final    BOTTLES DRAWN AEROBIC ONLY Blood Culture adequate volume Performed at [place] [address] [place], [place] [address]    Culture   Final    NO GROWTH 5 DAYS Performed at [facility], [place] [address], [place], [place] [address]    Report Status 04/06/2021 FINAL  Final  Culture, blood (routine x 2)     Status: None   Collection Time: 04/01/21  2:50 PM   Specimen: BLOOD  Result Value Ref Range Status   Specimen Description   Final    BLOOD BLOOD LEFT FOREARM Performed at [place] [address], [place], [place] [address]    Special Requests   Final    BOTTLES DRAWN AEROBIC ONLY Blood Culture adequate volume Performed at [place] [address], [place],  [address]    Culture   Final    NO GROWTH 5 DAYS Performed at [facility], [place] [address] [place],  [address]    Report Status 04/06/2021 FINAL  Final     Labs: BNP (last 3 results) No results for input(s): BNP in the last 8760 hours. Basic Metabolic Panel: Recent Labs  Lab 03/31/21 1049 04/01/21 0215 04/02/21 0527 04/03/21 0521  NA 138 136 134* 137  K 3.8 3.3* 3.2* 3.5  CL 102 101 105 106  CO2 _0 GLUCOSE 71 146* 98 98  BUN _1 CREATININE 0.90 0.69 0.72 0.83  CALCIUM 9.1 8.9 8.3* 8.5*  MG  --   --  1.6*  --    Liver Function Tests: Recent Labs  Lab 03/31/21 1049 04/01/21 0215 04/02/21 0527  AST 56* 49* 25  ALT _2 ALKPHOS 54 51 42  BILITOT 2.2* 2.0* 1.4*  PROT 7.5 7.0 5.8*  ALBUMIN 4.0 3.8 3.0*   No results for input(s): LIPASE, AMYLASE in the last 168 hours. Recent Labs  Lab 04/01/21 0215  AMMONIA 32   CBC: Recent Labs  Lab 03/31/21 1049 04/01/21 0215 04/01/21 2329  WBC 8.2 6.0 5.9  NEUTROABS  --   --  4.1  HGB 12.7*  12.8* 11.7*  HCT 38.5* 38.6* 34.7*  MCV 102.1* 100.3* 100.6*  PLT 184 151 160   Cardiac Enzymes: No results for input(s): CKTOTAL, CKMB, CKMBINDEX, TROPONINI in the last 168 hours. BNP: Invalid input(s): POCBNP CBG: Recent Labs  Lab 04/05/21 0737 04/05/21 1215 04/05/21 1715 04/05/21 2341 04/06/21 0556  GLUCAP 84 116* 128* 121* 96   D-Dimer No results for input(s): DDIMER in the last 72 hours. Hgb A1c No results for input(s): HGBA1C in the last 72 hours. Lipid Profile No results for input(s): CHOL, HDL, LDLCALC, TRIG, CHOLHDL, LDLDIRECT in the last 72 hours. Thyroid function studies No results for input(s): TSH, T4TOTAL, T3FREE, THYROIDAB in the last 72 hours.  Invalid input(s): FREET3 Anemia work up No results for input(s): VITAMINB12, FOLATE, FERRITIN, TIBC, IRON, RETICCTPCT in the last 72 hours. Urinalysis    Component Value Date/Time   COLORURINE STRAW (A) 03/31/2021 1051   APPEARANCEUR CLEAR 03/31/2021 1051   LABSPEC 1.010 03/31/2021 1051   PHURINE 7.0 03/31/2021 1051   GLUCOSEU NEGATIVE 03/31/2021 1051   HGBUR SMALL (A) 03/31/2021 1051   BILIRUBINUR NEGATIVE 03/31/2021 1051   KETONESUR 5 (A) 03/31/2021 1051   PROTEINUR NEGATIVE 03/31/2021 1051   NITRITE NEGATIVE 03/31/2021 1051   LEUKOCYTESUR NEGATIVE 03/31/2021 1051   Sepsis Labs Invalid input(s): PROCALCITONIN,  WBC,  LACTICIDVEN Microbiology Recent Results (from the past 240  hour(s))  Resp Panel by RT-PCR (Flu A&B, Covid) Nasopharyngeal Swab     Status: None   Collection Time: 03/31/21 10:50 AM   Specimen: Nasopharyngeal Swab; Nasopharyngeal(NP) swabs in vial transport medium  Result Value Ref Range Status   SARS Coronavirus 2 by RT PCR NEGATIVE NEGATIVE Final    Comment: (NOTE) SARS-CoV-2 target nucleic acids are NOT DETECTED.  The SARS-CoV-2 RNA is generally detectable in upper respiratory specimens during the acute phase of infection. The lowest concentration of SARS-CoV-2 viral copies this assay can detect is 138 copies/mL. A negative result does not preclude SARS-Cov-2 infection and should not be used as the sole basis for treatment or other patient management decisions. A negative result may occur with  improper specimen collection/handling, submission of specimen other than nasopharyngeal swab, presence of viral mutation(s) within the areas targeted by this assay, and inadequate number of viral copies(<138 copies/mL). A negative result must be combined with clinical observations, patient history, and epidemiological information. The expected result is Negative.  Fact Sheet for Patients:  EntrepreneurPulse.com.au  Fact Sheet for Healthcare Providers:  IncredibleEmployment.be  This test is no t yet approved or cleared by the [place] FDA and  has been authorized for detection and/or diagnosis of SARS-CoV-2 by FDA under an Emergency Use Authorization (EUA). This EUA will remain  in effect (meaning this test can be used) for the duration of the COVID-19 declaration under Section 564(b)(1) of the Act, 21 U.S.C.section 360bbb-3(b)(1), unless the authorization is terminated  or revoked sooner.       Influenza A by PCR NEGATIVE NEGATIVE Final   Influenza B by PCR NEGATIVE NEGATIVE Final    Comment: (NOTE) The Xpert Xpress SARS-CoV-2/FLU/RSV plus assay is intended as an aid in the diagnosis of influenza from  Nasopharyngeal swab specimens and should not be used as a sole basis for treatment. Nasal washings and aspirates are unacceptable for Xpert Xpress SARS-CoV-2/FLU/RSV testing.  Fact Sheet for Patients: EntrepreneurPulse.com.au  Fact Sheet for Healthcare Providers: IncredibleEmployment.be  This test is not yet approved or cleared by the [place] FDA and has been authorized for detection and/or diagnosis  of SARS-CoV-2 by FDA under an Emergency Use Authorization (EUA). This EUA will remain in effect (meaning this test can be used) for the duration of the COVID-19 declaration under Section 564(b)(1) of the Act, 21 U.S.C. section 360bbb-3(b)(1), unless the authorization is terminated or revoked.  Performed at [facility], [place] [address] [place], [place] [address]   Urine culture     Status: Abnormal   Collection Time: 03/31/21 10:51 AM   Specimen: Urine, Clean Catch  Result Value Ref Range Status   Specimen Description   Final    URINE, CLEAN CATCH Performed at [facility], [place] [address] [place], [place] [address]    Special Requests   Final    NONE Performed at [facility], [place] [address] [place], [place] [address]    Culture MULTIPLE SPECIES PRESENT, SUGGEST RECOLLECTION (A)  Final   Report Status 04/03/2021 FINAL  Final  Culture, blood (routine x 2)     Status: None   Collection Time: 04/01/21  2:50 PM   Specimen: BLOOD  Result Value Ref Range Status   Specimen Description   Final    BLOOD LEFT ANTECUBITAL Performed at [place] [address] [place], [place] [address]    Special Requests   Final    BOTTLES DRAWN AEROBIC ONLY Blood Culture adequate volume Performed at [place] [address] [place], [place] [address]    Culture   Final    NO GROWTH 5 DAYS Performed at [facility], [place] [address],  [place], [place] [address]    Report Status 04/06/2021 FINAL  Final  Culture, blood (routine x 2)     Status: None   Collection Time: 04/01/21  2:50 PM   Specimen: BLOOD  Result Value Ref Range Status   Specimen Description   Final    BLOOD BLOOD LEFT FOREARM Performed at [place] [address] [place], [place] [address]    Special Requests   Final    BOTTLES DRAWN AEROBIC ONLY Blood Culture adequate volume Performed at [place] [address], [place], [place] [address]    Culture   Final    NO GROWTH 5 DAYS Performed at [facility], [place] [address] [place], [place] [address]    Report Status 04/06/2021 FINAL  Final    Please note: You were cared for by a hospitalist during your hospital stay. Once you are discharged, your primary care physician will handle any further medical issues. Please note that NO REFILLS for any discharge medications will be authorized once you are discharged, as it is imperative that you return to your primary care physician (or establish a relationship with a primary care physician if you do not have one) for your post hospital discharge needs so that they can reassess your need for medications and monitor your lab values.    Time coordinating discharge: 40 minutes  SIGNED:   Shelly Coss, MD  [facility] 04/06/2021, 11:02 AM Pager [phone]  If 7PM-7AM, please contact night-coverage [email or web address] Password [email or web address]

## 2021-04-06 NOTE — Telephone Encounter (Signed)
Scheduled appt per 6/17 referral msg. Spoke to pt's sister who is aware of appt. Also asked pt's sister to have pt's previous provider in [place] fax his records to [name]. She said she would call them and have them send the records over. I gave her our fax number.

## 2021-04-06 NOTE — Plan of Care (Signed)
  Problem: Education: Goal: Knowledge of General Education information will improve Description: Including pain rating scale, medication(s)/side effects and non-pharmacologic comfort measures Outcome: Adequate for Discharge   Problem: Health Behavior/Discharge Planning: Goal: Ability to manage health-related needs will improve Outcome: Adequate for Discharge   Problem: Clinical Measurements: Goal: Ability to maintain clinical measurements within normal limits will improve Outcome: Adequate for Discharge Goal: Will remain free from infection Outcome: Adequate for Discharge Goal: Diagnostic test results will improve Outcome: Adequate for Discharge Goal: Respiratory complications will improve Outcome: Adequate for Discharge Goal: Cardiovascular complication will be avoided Outcome: Adequate for Discharge   Problem: Nutrition: Goal: Adequate nutrition will be maintained Outcome: Adequate for Discharge   Problem: Coping: Goal: Level of anxiety will decrease Outcome: Adequate for Discharge   

## 2021-04-06 NOTE — TOC Transition Note (Signed)
Transition of Care [facility]) - CM/SW Discharge Note  Patient Details  Name: Aaron Gonzalez MRN: [id number] Date of Birth: July 05, 1946  Transition [facility] CM/SW Contact:  Sherie Don, LCSW Phone Number: 04/06/2021, 11:26 AM  Clinical Narrative: Patient to discharge home with HHPT/HHOT. HH was set up with [facility]. Orders are in. TOC signing off.  Final next level of care: [place] Barriers to Discharge: No Barriers Identified  Patient Goals and CMS Choice Patient states their goals for this hospitalization and ongoing recovery are:: Discharge home with [facility] CMS Medicare.gov Compare Post Acute Care list provided to:: Patient Choice offered to / list presented to : Patient, Sibling  Discharge Plan and Services Discharge Planning Services: CM Consult Post Acute Care Choice: Home Health          DME Arranged: Tub bench, 3-N-1 DME Agency: [employer] Date DME Agency Contacted: 04/04/21 HH Arranged: PT, OT HH Agency: [place] Date [facility] Agency Contacted: 04/03/21 Representative spoke with at [place]: Jenny Reichmann  Readmission Risk Interventions No flowsheet data found.

## 2021-04-11 DIAGNOSIS — E782 Mixed hyperlipidemia: Secondary | ICD-10-CM | POA: Insufficient documentation

## 2021-04-11 DIAGNOSIS — K219 Gastro-esophageal reflux disease without esophagitis: Secondary | ICD-10-CM | POA: Insufficient documentation

## 2021-04-12 ENCOUNTER — Telehealth: Payer: Self-pay

## 2021-04-12 NOTE — Telephone Encounter (Signed)
Referral notes sent from [place] , Phone #: [phone], Fax #: [phone]   Notes sent to scheduling

## 2021-04-16 ENCOUNTER — Encounter: Payer: Self-pay | Admitting: Internal Medicine

## 2021-04-16 ENCOUNTER — Other Ambulatory Visit: Payer: Self-pay

## 2021-04-16 ENCOUNTER — Inpatient Hospital Stay: Payer: Medicare Other | Attending: Internal Medicine | Admitting: Internal Medicine

## 2021-04-16 VITALS — BP 154/89 | HR 82 | Temp 97.3°F | Resp 18 | Ht 65.0 in | Wt 150.7 lb

## 2021-04-16 DIAGNOSIS — Z85048 Personal history of other malignant neoplasm of rectum, rectosigmoid junction, and anus: Secondary | ICD-10-CM | POA: Diagnosis not present

## 2021-04-16 DIAGNOSIS — Z5111 Encounter for antineoplastic chemotherapy: Secondary | ICD-10-CM

## 2021-04-16 DIAGNOSIS — Z8673 Personal history of transient ischemic attack (TIA), and cerebral infarction without residual deficits: Secondary | ICD-10-CM | POA: Insufficient documentation

## 2021-04-16 DIAGNOSIS — C9110 Chronic lymphocytic leukemia of B-cell type not having achieved remission: Secondary | ICD-10-CM | POA: Insufficient documentation

## 2021-04-16 LAB — SULFONYLUREA HYPOGLYCEMICS PANEL, SERUM
Acetohexamide: NEGATIVE ug/mL (ref 20–60)
Chlorpropamide: NEGATIVE ug/mL (ref 75–250)
Glimepiride: NEGATIVE ng/mL (ref 80–250)
Glipizide: NEGATIVE ng/mL (ref 200–1000)
Glyburide: NEGATIVE ng/mL
Nateglinide: NEGATIVE ng/mL
Repaglinide: NEGATIVE ng/mL
Tolazamide: NEGATIVE ug/mL
Tolbutamide: NEGATIVE ug/mL (ref 40–100)

## 2021-04-16 NOTE — Progress Notes (Signed)
[place] [phone]   [phone]  CONSULT NOTE  REFERRING PHYSICIAN: Dr. Arsenio Loader, [place]:  75 years old white male with history of chronic lymphocytic leukemia to establish care.  HPI Aaron Gonzalez is a 75 y.o. male with past medical history significant for hypertension, history of rectal cancer diagnosed in 2007 status post a course of chemoradiation in [place].  The patient also has a history of stroke in March 2022 and diagnosis of chronic lymphocytic leukemia more than 15 years ago.  The patient was initially on observation for several years but mentions that 3 years ago he started having significant increase in the total white blood count and he started on treatment with ibrutinib 420 mg p.o. daily.  The patient has been tolerating this treatment well with no concerning adverse effects.  He denied having any symptoms at the time he started the ibrutinib except for the increased white blood count.  He had no concerning lymphadenopathy, hepatosplenomegaly, anemia or thrombocytopenia. He moved recently to [facility] to be closer to his sister and the patient was referred to me today for evaluation and to establish care for his chronic lymphocytic leukemia. When seen today he is feeling fine today with no concerning complaints.  He lost around 25 pounds over the last 2 years but he is has been stable for several months now.  He denied having any chest pain, shortness of breath, cough or hemoptysis.  He denied having any nausea, vomiting, diarrhea or constipation.  He has no headache or visual changes. Family history significant for mother died at age 39 with congestive heart failure. Father had cerebral hemorrhage and died at age 85. The patient is divorced and has 2 children a son and daughter.  He used to work for the [employer].  He was accompanied today by his  Sister Aaron Gonzalez.  The patient has a history of smoking less than 1 pack/day for around 10 years and quit 4 years ago.  He has no current history of alcohol or drug abuse. HPI  Past Medical History:  Diagnosis Date   CLL (chronic lymphocytic leukemia) ([place])    History of rectal cancer    Hypertension    Stroke [facility])     History reviewed. No pertinent surgical history.  Family History  Problem Relation Age of Onset   Heart failure Mother    Stroke Father     Social History Social History   Tobacco Use   Smoking status: Former    Packs/day: 0.50    Years: 10.00    Pack years: 5.00    Types: Cigarettes    Passive exposure: Never   Smokeless tobacco: Never  Vaping Use   Vaping Use: Never used  Substance Use Topics   Alcohol use: Not Currently   Drug use: Not Currently    No Known Allergies  Current Outpatient Medications  Medication Sig Dispense Refill   allopurinol (ZYLOPRIM) 300 MG tablet Take 300 mg by mouth daily.     amLODipine (NORVASC) 10 MG tablet Take 1 tablet (10 mg total) by mouth daily. 30 tablet 1   blood glucose meter kit and supplies KIT Dispense based on patient and insurance preference. Use up to four times daily as directed. 1 each 0   enalapril (VASOTEC) 20 MG tablet Take 20 mg by mouth 2 (two) times daily.     ibrutinib (IMBRUVICA)  420 MG TABS Take 420 mg by mouth daily.     pantoprazole (PROTONIX) 40 MG tablet Take 40 mg by mouth daily.     predniSONE (DELTASONE) 10 MG tablet Take 1 tablet (10 mg total) by mouth daily with breakfast. 15 tablet 0   rosuvastatin (CRESTOR) 20 MG tablet Take 20 mg by mouth at bedtime.     sulfamethoxazole-trimethoprim (BACTRIM DS) 800-160 MG tablet Take 1 tablet by mouth every Monday, Wednesday, and Friday.     valACYclovir (VALTREX) 500 MG tablet Take 500 mg by mouth every 12 (twelve) hours.     No current facility-administered medications for this visit.    Review of Systems  Constitutional: positive for  fatigue Eyes: negative Ears, nose, mouth, throat, and face: negative Respiratory: negative Cardiovascular: negative Gastrointestinal: negative Genitourinary:negative Integument/breast: negative Hematologic/lymphatic: negative Musculoskeletal:positive for arthralgias and muscle weakness Neurological: negative Behavioral/Psych: negative Endocrine: negative Allergic/Immunologic: negative  Physical Exam  NOB:SJGGE, healthy, no distress, well nourished, and well developed SKIN: skin color, texture, turgor are normal, no rashes or significant lesions HEAD: Normocephalic, No masses, lesions, tenderness or abnormalities EYES: normal, PERRLA, Conjunctiva are pink and non-injected EARS: External ears normal, Canals clear OROPHARYNX:no exudate, no erythema, and lips, buccal mucosa, and tongue normal  NECK: supple, no adenopathy, no JVD LYMPH:  no palpable lymphadenopathy, no hepatosplenomegaly LUNGS: clear to auscultation , and palpation HEART: regular rate & rhythm, no murmurs, and no gallops ABDOMEN:abdomen soft, non-tender, normal bowel sounds, and no masses or organomegaly BACK: No CVA tenderness, Range of motion is normal EXTREMITIES:no joint deformities, effusion, or inflammation, no edema  NEURO: alert & oriented x 3 with fluent speech, no focal motor/sensory deficits  PERFORMANCE STATUS: ECOG 1  LABORATORY DATA: Lab Results  Component Value Date   WBC 5.9 04/01/2021   HGB 11.7 (L) 04/01/2021   HCT 34.7 (L) 04/01/2021   MCV 100.6 (H) 04/01/2021   PLT 160 04/01/2021      Chemistry      Component Value Date/Time   NA 137 04/03/2021 0521   K 3.5 04/03/2021 0521   CL 106 04/03/2021 0521   CO2 25 04/03/2021 0521   BUN 9 04/03/2021 0521   CREATININE 0.83 04/03/2021 0521      Component Value Date/Time   CALCIUM 8.5 (L) 04/03/2021 0521   ALKPHOS 42 04/02/2021 0527   AST 25 04/02/2021 0527   ALT 14 04/02/2021 0527   BILITOT 1.4 (H) 04/02/2021 0527       RADIOGRAPHIC  STUDIES: CT ABDOMEN PELVIS WO CONTRAST  Result Date: 03/31/2021 CLINICAL DATA:  Acute, non localized abdominal pain. EXAM: CT ABDOMEN AND PELVIS WITHOUT CONTRAST TECHNIQUE: Multidetector CT imaging of the abdomen and pelvis was performed following the standard protocol without IV contrast. COMPARISON:  None. FINDINGS: Lower chest: Mildly enlarged heart. Minimal bibasilar atelectasis/scarring. Hepatobiliary: Normal appearing liver. Poorly distended gallbladder. Pancreas: Unremarkable. No pancreatic ductal dilatation or surrounding inflammatory changes. Spleen: Normal in size without focal abnormality. Adrenals/Urinary Tract: The adrenal glands are not well visualized due to streak artifacts produced by the patient's arms cysts. No gross adrenal abnormalities are seen. Multiple probable bilateral renal cysts, difficult to assess due to the streak artifacts. Tiny lower pole right renal calculus. Tiny upper pole left renal calculus. No bladder or ureteral calculi seen. Portions of the bladder and distal ureters are obscured by streak artifacts from a right prosthesis. Stomach/Bowel: Small hiatal hernia. Single proximal sigmoid colon diverticulum. No visible small bowel abnormalities. No findings suspicious for appendicitis. Vascular/Lymphatic: No significant  vascular findings are present. No enlarged abdominal or pelvic lymph nodes. Reproductive: Probable moderately enlarged prostate gland, poorly visualized due to streak artifacts. Other: Bilateral hydroceles.  Left lower quadrant ostomy. Musculoskeletal: Right hip prosthesis with associated streak artifacts. Marked left hip degenerative changes. Moderate thoracolumbar scoliosis and extensive degenerative changes. IMPRESSION: 1. Limited examination with no gross acute abnormality. 2. Tiny bilateral, nonobstructing renal calculi. 3. Small hiatal hernia. Electronically Signed   By: Claudie Revering M.D.   On: 03/31/2021 18:15   X-ray chest PA and lateral  Result Date:  04/01/2021 CLINICAL DATA:  Follow-up exam EXAM: CHEST - 2 VIEW COMPARISON:  03/31/2021 FINDINGS: Heart is borderline in size. No confluent airspace opacities or effusions. Mediastinal contours are within normal limits. No evidence of aortic aneurysm. IMPRESSION: Borderline heart size.  No active disease. Electronically Signed   By: Rolm Baptise M.D.   On: 04/01/2021 00:26   CT Head Wo Contrast  Result Date: 03/31/2021 CLINICAL DATA:  Altered mental status following a fall this morning. EXAM: CT HEAD WITHOUT CONTRAST TECHNIQUE: Contiguous axial images were obtained from the base of the skull through the vertex without intravenous contrast. COMPARISON:  None. FINDINGS: Brain: Mildly enlarged ventricles and cortical sulci. Mild patchy white matter low density in both cerebral hemispheres. No intracranial hemorrhage, mass lesion or CT evidence of acute infarction. Vascular: No hyperdense vessel or unexpected calcification. Skull: Normal. Negative for fracture or focal lesion. Sinuses/Orbits: Completely opacified right maxillary sinus with extensive mucosal thickening in the right ethmoid and frontal sinuses. Unremarkable orbits. Other: None. IMPRESSION: 1. No acute abnormality. 2. Mild diffuse cerebral atrophy. 3. Mild chronic small vessel white matter ischemic changes in both cerebral hemispheres. 4. Extensive chronic right maxillary, ethmoid and frontal sinusitis. Electronically Signed   By: Claudie Revering M.D.   On: 03/31/2021 13:33   MR BRAIN WO CONTRAST  Result Date: 03/31/2021 CLINICAL DATA:  Initial evaluation for delirium. EXAM: MRI HEAD WITHOUT CONTRAST TECHNIQUE: Multiplanar, multiecho pulse sequences of the brain and surrounding structures were obtained without intravenous contrast. COMPARISON:  Prior CT from earlier the same day. FINDINGS: Brain: Examination technically limited by extensive motion artifact, markedly degrading multiple sequences. Generalized age-related cerebral atrophy. Scattered  patchy T2/FLAIR hyperintensity within the periventricular deep white matter both cerebral hemispheres as well as the pons, most consistent with chronic small vessel ischemic disease, mild to moderate in nature. Remote right basal ganglia infarct with prominent susceptibility artifact, consistent with a chronic hemorrhagic infarct (series 26, image 33). No acute hemorrhage seen at this location on prior CT. No abnormal foci of restricted diffusion to suggest acute or subacute ischemia. Gray-white matter differentiation maintained. No encephalomalacia to suggest chronic cortical infarction. No other acute or chronic intracranial hemorrhage. No mass lesion, midline shift or mass effect. Ventricles normal size without hydrocephalus. No extra-axial fluid collection. Pituitary gland suprasellar region within normal limits. Midline structures intact. Vascular: Major intracranial vascular flow voids are grossly maintained at the skull base. Skull and upper cervical spine: Craniocervical junction within normal limits. Bone marrow signal intensity normal. No focal marrow replacing lesion. No scalp soft tissue abnormality. Sinuses/Orbits: Globes and orbital soft tissues demonstrate no acute finding. Chronic right frontoethmoidal and maxillary sinusitis noted. Trace left mastoid effusion, of doubtful significance. Other: None. IMPRESSION: 1. Technically limited exam due to extensive motion artifact. 2. No acute intracranial abnormality. 3. Remote right basal ganglia hemorrhagic infarct. 4. Underlying moderate chronic microvascular ischemic disease. 5. Chronic right sided paranasal sinus disease. Electronically Signed   By: Marland Kitchen  Jeannine Boga M.D.   On: 03/31/2021 20:02   DG Chest Portable 1 View  Result Date: 03/31/2021 CLINICAL DATA:  Altered mental status. EXAM: PORTABLE CHEST 1 VIEW COMPARISON:  None. FINDINGS: Numerous leads and wires project over the chest. Mild right hemidiaphragm elevation. Midline trachea. Normal  heart size. Mildly prominent transverse aorta. No pleural effusion or pneumothorax. Clear lungs. IMPRESSION: No acute cardiopulmonary disease. Mildly prominent transverse aorta, possibly related AP portable technique. Consider nonemergent follow-up with PA and lateral radiographs. Electronically Signed   By: Abigail Miyamoto M.D.   On: 03/31/2021 11:49    ASSESSMENT: This is a very pleasant 75 years old African-American male with history of rectal cancer in 2007 status post concurrent chemoradiation and surgery The patient also has a history of chronic lymphocytic leukemia diagnosed more than 15 years ago.  He was initially started on treatment at [facility] with ibrutinib.  PLAN: I had a lengthy discussion with the patient and his sister today about his current condition and treatment options. The patient has been tolerating his treatment with ibrutinib fairly well with no concerning adverse effects. Repeat CBC less than 2 weeks ago showed normal white blood count in addition to mild anemia. I recommended for the patient to continue with his current treatment with ibrutinib 420 mg p.o. daily. I will see him back for follow-up visit in 3 months for evaluation with repeat CBC, comprehensive metabolic panel and LDH. The patient was advised to call immediately if he has any other concerning symptoms in the interval. The patient voices understanding of current disease status and treatment options and is in agreement with the current care plan.  All questions were answered. The patient knows to call the clinic with any problems, questions or concerns. We can certainly see the patient much sooner if necessary.  Thank you so much for allowing me to participate in the care of Aaron Gonzalez. I will continue to follow up the patient with you and assist in his care.  The total time spent in the appointment was 60 minutes.  Disclaimer: This note was dictated with voice recognition software. Similar sounding  words can inadvertently be transcribed and may not be corrected upon review.   Eilleen Kempf April 16, 2021, 2:01 PM

## 2021-04-18 ENCOUNTER — Telehealth: Payer: Self-pay | Admitting: Internal Medicine

## 2021-04-18 ENCOUNTER — Ambulatory Visit
Admission: RE | Admit: 2021-04-18 | Discharge: 2021-04-18 | Disposition: A | Discharge status: Home / Self Care | Payer: Medicare Other | Source: Ambulatory Visit | Attending: Urgent Care | Admitting: Urgent Care

## 2021-04-18 DIAGNOSIS — I82409 Acute embolism and thrombosis of unspecified deep veins of unspecified lower extremity: Secondary | ICD-10-CM | POA: Insufficient documentation

## 2021-04-18 DIAGNOSIS — R600 Localized edema: Secondary | ICD-10-CM

## 2021-04-18 NOTE — Telephone Encounter (Signed)
Scheduled per [place]. Called and left msg. Mailed printout  °

## 2021-04-30 ENCOUNTER — Other Ambulatory Visit: Payer: Self-pay

## 2021-04-30 ENCOUNTER — Ambulatory Visit (HOSPITAL_COMMUNITY)
Admission: RE | Admit: 2021-04-30 | Discharge: 2021-04-30 | Disposition: A | Discharge status: Home / Self Care | Payer: Medicare Other | Source: Ambulatory Visit | Attending: Urgent Care | Admitting: Urgent Care

## 2021-04-30 ENCOUNTER — Other Ambulatory Visit (HOSPITAL_COMMUNITY): Payer: Self-pay | Admitting: Urgent Care

## 2021-04-30 DIAGNOSIS — I82412 Acute embolism and thrombosis of left femoral vein: Secondary | ICD-10-CM | POA: Diagnosis present

## 2021-05-21 NOTE — Progress Notes (Signed)
[facility] [place]  ASSESSMENT / PLAN: 75 y.o. male with resolved left lower extremity deep venous thrombosis.  He has some mild persistent swelling.  I encouraged him to compress and elevate the leg is much as possible.  Encouraged him to continue exercising as he is able.  No need for therapeutic anticoagulation from my standpoint, given the risk with recent intracranial hemorrhage.  We will see him again in a month with a venous duplex.  CHIEF COMPLAINT: Left leg swelling, recent diagnosis of chronic DVT  HISTORY OF PRESENT ILLNESS: Aaron Gonzalez is a 75 y.o. male who suffered a stroke in late spring caused by intracranial hemorrhage.  He originally lived in [place] and recently moved to [place] to be with his sisters to recover.  He is ambulatory and recovering quite well.  He developed left lower extremity swelling late June.  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  Repeat study 04/30/2021 revealed similar findings.  On my evaluation in the office, the patient reports persistent swelling in the left leg which is a bit bothersome to him.  He is able to ambulate and get around without too much difficulty, however.  He has not been anticoagulated because of the recent intracranial hemorrhage.  Past Medical History:  Diagnosis Date   CLL (chronic lymphocytic leukemia) ([place])    History of rectal cancer    Hypertension    Stroke ([place])     No past surgical history on file.  Family History  Problem Relation Age of Onset   Heart failure Mother    Stroke Father     Social History   Socioeconomic History   Marital status: Single    Spouse name: Not on file   Number of children: Not on file   Years of education: Not on file   Highest education level: Not on file  Occupational History   Not on file  Tobacco Use   Smoking status: Former     Packs/day: 0.50    Years: 10.00    Pack years: 5.00    Types: Cigarettes    Passive exposure: Never   Smokeless tobacco: Never  Vaping Use   Vaping Use: Never used  Substance and Sexual Activity   Alcohol use: Not Currently   Drug use: Not Currently   Sexual activity: Not on file  Other Topics Concern   Not on file  Social History Narrative   Not on file   Social Determinants of Health   Financial Resource Strain: Not on file  Food Insecurity: Not on file  Transportation Needs: Not on file  Physical Activity: Not on file  Stress: Not on file  Social Connections: Not on file  Intimate Partner Violence: Not on file    No Known Allergies  Current Outpatient Medications  Medication Sig Dispense Refill   allopurinol (ZYLOPRIM) 300 MG tablet Take 300 mg by mouth daily. (Patient not taking: Reported on 04/16/2021)     amLODipine (NORVASC) 10 MG tablet Take 1 tablet (10 mg total) by mouth daily. 30 tablet 1   blood glucose meter kit and supplies KIT Dispense based on patient and insurance preference. Use up to four times daily as directed. 1 each 0   enalapril (VASOTEC) 20 MG tablet Take 20 mg by mouth 2 (two) times daily.     ibrutinib (IMBRUVICA) 420 MG TABS Take 420 mg by mouth daily.  pantoprazole (PROTONIX) 40 MG tablet Take 40 mg by mouth daily.     predniSONE (DELTASONE) 10 MG tablet Take 1 tablet (10 mg total) by mouth daily with breakfast. 15 tablet 0   rosuvastatin (CRESTOR) 20 MG tablet Take 20 mg by mouth at bedtime.     sulfamethoxazole-trimethoprim (BACTRIM DS) 800-160 MG tablet Take 1 tablet by mouth every Monday, Wednesday, and Friday.     valACYclovir (VALTREX) 500 MG tablet Take 500 mg by mouth every 12 (twelve) hours.     No current facility-administered medications for this visit.    REVIEW OF SYSTEMS:  _0  denotes positive finding, _1  denotes negative finding Cardiac  Comments:  Chest pain or chest pressure:    Shortness of breath upon exertion:     Short of breath when lying flat:    Irregular heart rhythm:        Vascular    Pain in calf, thigh, or hip brought on by ambulation:    Pain in feet at night that wakes you up from your sleep:     Blood clot in your veins:    Leg swelling:         Pulmonary    Oxygen at home:    Productive cough:     Wheezing:         Neurologic    Sudden weakness in arms or legs:     Sudden numbness in arms or legs:     Sudden onset of difficulty speaking or slurred speech:    Temporary loss of vision in one eye:     Problems with dizziness:         Gastrointestinal    Blood in stool:     Vomited blood:         Genitourinary    Burning when urinating:     Blood in urine:        Psychiatric    Major depression:         Hematologic    Bleeding problems:    Problems with blood clotting too easily:        Skin    Rashes or ulcers:        Constitutional    Fever or chills:      PHYSICAL EXAM Vitals:   05/22/21 0918  BP: (!) 173/96  Pulse: 78  Resp: 20  Temp: 98.3 F (36.8 C)  SpO2: 98%  Weight: 154 lb (69.9 kg)  Height: _2  (1.651 m)    Constitutional: well appearing. no distress. Appears well nourished.  Neurologic: Hesitant gait. Walks with walker. Some difficulty with fluency. Psychiatric:  Mood and affect symmetric and appropriate. Eyes:  No icterus. No conjunctival pallor. Ears, nose, throat:  mucous membranes moist. Midline trachea.  Cardiac: regular rate and rhythm.  Respiratory:  unlabored. Abdominal:  soft, non-distended.  Peripheral vascular: 2+ edema LLE. 2+ DPs Extremity:  no cyanosis. no pallor.  Skin: no gangrene. no ulceration.  Lymphatic: no Stemmer's sign. no palpable lymphadenopathy.  PERTINENT LABORATORY AND RADIOLOGIC DATA  Most recent CBC CBC Latest Ref Rng & Units 04/01/2021 04/01/2021 03/31/2021  WBC 4.0 - 10.5 K/uL 5.9 6.0 8.2  Hemoglobin 13.0 - 17.0 g/dL 11.7(L) 12.8(L) 12.7(L)  Hematocrit 39.0 - 52.0 % 34.7(L) 38.6(L) 38.5(L)  Platelets  150 - 400 K/uL 160 151 184     Most recent CMP CMP Latest Ref Rng & Units 04/03/2021 04/02/2021 04/01/2021  Glucose 70 - 99 mg/dL 98 98 146(H)  BUN 8 -  23 mg/dL _0 Creatinine 0.61 - 1.24 mg/dL 0.83 0.72 0.69  Sodium 135 - 145 mmol/L 137 134(L) 136  Potassium 3.5 - 5.1 mmol/L 3.5 3.2(L) 3.3(L)  Chloride 98 - 111 mmol/L 106 105 101  CO2 22 - 32 mmol/L _1 Calcium 8.9 - 10.3 mg/dL 8.5(L) 8.3(L) 8.9  Total Protein 6.5 - 8.1 g/dL - 5.8(L) 7.0  Total Bilirubin 0.3 - 1.2 mg/dL - 1.4(H) 2.0(H)  Alkaline Phos 38 - 126 U/L - 42 51  AST 15 - 41 U/L - 25 49(H)  ALT 0 - 44 U/L - 14 19    Renal function CrCl cannot be calculated (Patient's most recent lab result is older than the maximum 21 days allowed.).  No results found for: HGBA1C  No results found for: LDLCALC, LDLC, HIRISKLDL, POCLDL, LDLDIRECT, REALLDLC, TOTLDLC  Lower Venous DVT Study   Patient Name:  CECILIA NISHIKAWA  Date of Exam:   04/30/2021  Medical Rec #: [id number]       Accession #:    [id number]  Date of Birth: 11/16/1945       Patient Gender: M  Patient Age:   75Y  Exam Location:  [facility]  Procedure:      VAS [name] LOWER EXTREMITY VENOUS (DVT)  Referring Phys:     ---------------------------------------------------------------------------  -----     Indications: Swelling.      Comparison Study: 04/18/2021: Lt- Nonocclusive and likely chronic DVT  within the                    left common femoral vein extending into the  saphenofemoral                    junction.   Performing Technologist: Ivan Croft      Examination Guidelines:  A complete evaluation includes B-mode imaging, spectral Doppler, color  Doppler,  and power Doppler as needed of all accessible portions of each vessel.  Bilateral  testing is considered an integral part of a complete examination. Limited  examinations for reoccurring indications may be performed as noted. The  reflux  portion of  the exam is performed with the patient in reverse  Trendelenburg.       +-----+---------------+---------+-----------+----------+--------------+  RIGHTCompressibilityPhasicitySpontaneityPropertiesThrombus Aging  +-----+---------------+---------+-----------+----------+--------------+  CFV  Full           Yes      Yes                                  +-----+---------------+---------+-----------+----------+--------------+           +---------+---------------+---------+-----------+----------+--------------+   LEFT     CompressibilityPhasicitySpontaneityPropertiesThrombus  Aging  +---------+---------------+---------+-----------+----------+--------------+   CFV      Partial        Yes      Yes                                    +---------+---------------+---------+-----------+----------+--------------+   SFJ      Partial                                                        +---------+---------------+---------+-----------+----------+--------------+  FV Prox  Full                                                           +---------+---------------+---------+-----------+----------+--------------+   FV Mid   Full           Yes      Yes                                    +---------+---------------+---------+-----------+----------+--------------+   FV DistalFull                                                           +---------+---------------+---------+-----------+----------+--------------+   PFV      Full                                                           +---------+---------------+---------+-----------+----------+--------------+   POP      Full           Yes      Yes                                    +---------+---------------+---------+-----------+----------+--------------+   PTV      Full                                                            +---------+---------------+---------+-----------+----------+--------------+   PERO     Full                                                           +---------+---------------+---------+-----------+----------+--------------+   GSV      Full                                                           +---------+---------------+---------+-----------+----------+--------------+   SSV      Full                                                           +---------+---------------+---------+-----------+----------+--------------+  Summary:  RIGHT:  - No evidence of common femoral vein obstruction.     LEFT:  - Findings consistent with chronic deep vein thrombosis involving the left  common femoral vein and extending into the saphenofemoral junction.  Appears unchanged from the prior study on 04/18/2021.  - There is no evidence of superficial venous thrombosis.     - No cystic structure found in the popliteal fossa.    - Attempted to call results to Geryl Councilman PA (04/30/2021) but there was  no answer.      *See table(s) above for measurements and observations.   Electronically signed by Monica Martinez MD on 04/30/2021 at 7:00:09 PM.    Lower Venous DVT Study   Patient Name:  Aaron Gonzalez  Date of Exam:   05/22/2021  Medical Rec #: [id number]       Accession #:    [id number]  Date of Birth: Nov 07, 1945       Patient Gender: M  Patient Age:   75Y  Exam Location:  [facility]  Procedure:      VAS [name] LOWER EXTREMITY VENOUS (DVT)  Referring Phys: [id number] Yevonne Aline Geriann Lafont    ---------------------------------------------------------------------------  -----     Indications: Rule out progression of DVT.  Other Indications: Pre-operative exam for IVC placement.   Performing Technologist: Alvia Grove RVT      Examination Guidelines:  A complete evaluation includes B-mode imaging, spectral Doppler, color  Doppler,   and power Doppler as needed of all accessible portions of each vessel.  Bilateral  testing is considered an integral part of a complete examination. Limited  examinations for reoccurring indications may be performed as noted. The  reflux  portion of the exam is performed with the patient in reverse  Trendelenburg.       +-----+---------------+---------+-----------+----------+--------------+  RIGHTCompressibilityPhasicitySpontaneityPropertiesThrombus Aging  +-----+---------------+---------+-----------+----------+--------------+  CFV  Full           Yes      Yes                                  +-----+---------------+---------+-----------+----------+--------------+           +---------+---------------+---------+-----------+----------+--------------+   LEFT     CompressibilityPhasicitySpontaneityPropertiesThrombus  Aging  +---------+---------------+---------+-----------+----------+--------------+   CFV      Full           Yes      Yes                                    +---------+---------------+---------+-----------+----------+--------------+   SFJ      Full           Yes      Yes                                    +---------+---------------+---------+-----------+----------+--------------+   FV Prox  Full           Yes      Yes                                    +---------+---------------+---------+-----------+----------+--------------+   FV Mid   Full           Yes  Yes                                    +---------+---------------+---------+-----------+----------+--------------+   FV DistalFull           Yes      Yes                                    +---------+---------------+---------+-----------+----------+--------------+   PFV      Full           Yes      Yes                                    +---------+---------------+---------+-----------+----------+--------------+   POP      Full            Yes      Yes                                    +---------+---------------+---------+-----------+----------+--------------+   PTV      Full           Yes      Yes                                    +---------+---------------+---------+-----------+----------+--------------+   PERO     Full           Yes      Yes                                    +---------+---------------+---------+-----------+----------+--------------+   GSV      Full           Yes      Yes                                    +---------+---------------+---------+-----------+----------+--------------+   SSV      Full           Yes      Yes                                    +---------+---------------+---------+-----------+----------+--------------+               Summary:   LEFT:  - There is no evidence of deep vein thrombosis in the lower extremity.  - There is no evidence of superficial venous thrombosis.  - There appears to be no visualized thrombus in the distal external iliac  vein       *See table(s) above for measurements and observations.   Electronically signed by Jamelle Haring on 05/22/2021 at 12:36:57 PM.   Yevonne Aline. Stanford Breed, MD [facility] Phone Number: [phone] 05/21/2021 11:50 AM

## 2021-05-22 ENCOUNTER — Ambulatory Visit: Payer: Medicare Other | Admitting: Vascular Surgery

## 2021-05-22 ENCOUNTER — Ambulatory Visit (HOSPITAL_COMMUNITY)
Admission: RE | Admit: 2021-05-22 | Discharge: 2021-05-22 | Disposition: A | Discharge status: Home / Self Care | Payer: Medicare Other | Source: Ambulatory Visit | Attending: Vascular Surgery | Admitting: Vascular Surgery

## 2021-05-22 ENCOUNTER — Encounter: Payer: Self-pay | Admitting: Vascular Surgery

## 2021-05-22 ENCOUNTER — Other Ambulatory Visit: Payer: Self-pay

## 2021-05-22 VITALS — BP 173/96 | HR 78 | Temp 98.3°F | Resp 20 | Ht 65.0 in | Wt 154.0 lb

## 2021-05-22 DIAGNOSIS — Z86718 Personal history of other venous thrombosis and embolism: Secondary | ICD-10-CM | POA: Diagnosis not present

## 2021-05-22 DIAGNOSIS — I82412 Acute embolism and thrombosis of left femoral vein: Secondary | ICD-10-CM

## 2021-05-23 ENCOUNTER — Ambulatory Visit (HOSPITAL_COMMUNITY): Admission: RE | Admit: 2021-05-23 | Payer: Medicare Other | Source: Home / Self Care | Admitting: Vascular Surgery

## 2021-05-23 ENCOUNTER — Encounter (HOSPITAL_COMMUNITY): Admission: RE | Payer: Self-pay | Source: Home / Self Care

## 2021-05-23 SURGERY — IVC FILTER INSERTION
Anesthesia: LOCAL

## 2021-06-14 ENCOUNTER — Emergency Department (HOSPITAL_COMMUNITY)
Admission: EM | Admit: 2021-06-14 | Discharge: 2021-06-15 | Disposition: A | Discharge status: Home / Self Care | Payer: Medicare Other | Attending: Emergency Medicine | Admitting: Emergency Medicine

## 2021-06-14 ENCOUNTER — Emergency Department (HOSPITAL_COMMUNITY): Payer: Medicare Other

## 2021-06-14 DIAGNOSIS — Z87891 Personal history of nicotine dependence: Secondary | ICD-10-CM | POA: Insufficient documentation

## 2021-06-14 DIAGNOSIS — I10 Essential (primary) hypertension: Secondary | ICD-10-CM | POA: Insufficient documentation

## 2021-06-14 DIAGNOSIS — Z79899 Other long term (current) drug therapy: Secondary | ICD-10-CM | POA: Diagnosis not present

## 2021-06-14 DIAGNOSIS — R4182 Altered mental status, unspecified: Secondary | ICD-10-CM | POA: Diagnosis present

## 2021-06-14 DIAGNOSIS — R410 Disorientation, unspecified: Secondary | ICD-10-CM | POA: Diagnosis not present

## 2021-06-14 LAB — CBC WITH DIFFERENTIAL/PLATELET
Abs Immature Granulocytes: 0.02 10*3/uL (ref 0.00–0.07)
Basophils Absolute: 0.1 10*3/uL (ref 0.0–0.1)
Basophils Relative: 1 %
Eosinophils Absolute: 0.2 10*3/uL (ref 0.0–0.5)
Eosinophils Relative: 2 %
HCT: 37 % — ABNORMAL LOW (ref 39.0–52.0)
Hemoglobin: 11.9 g/dL — ABNORMAL LOW (ref 13.0–17.0)
Immature Granulocytes: 0 %
Lymphocytes Relative: 28 %
Lymphs Abs: 2.1 10*3/uL (ref 0.7–4.0)
MCH: 30 pg (ref 26.0–34.0)
MCHC: 32.2 g/dL (ref 30.0–36.0)
MCV: 93.2 fL (ref 80.0–100.0)
Monocytes Absolute: 1.1 10*3/uL — ABNORMAL HIGH (ref 0.1–1.0)
Monocytes Relative: 15 %
Neutro Abs: 4 10*3/uL (ref 1.7–7.7)
Neutrophils Relative %: 54 %
Platelets: 178 10*3/uL (ref 150–400)
RBC: 3.97 MIL/uL — ABNORMAL LOW (ref 4.22–5.81)
RDW: 15 % (ref 11.5–15.5)
WBC: 7.5 10*3/uL (ref 4.0–10.5)
nRBC: 0 % (ref 0.0–0.2)

## 2021-06-14 LAB — BASIC METABOLIC PANEL
Anion gap: 8 (ref 5–15)
BUN: 12 mg/dL (ref 8–23)
CO2: 25 mmol/L (ref 22–32)
Calcium: 9 mg/dL (ref 8.9–10.3)
Chloride: 106 mmol/L (ref 98–111)
Creatinine, Ser: 0.94 mg/dL (ref 0.61–1.24)
GFR, Estimated: >60 mL/min (ref >60)
Glucose, Bld: 78 mg/dL (ref 70–99)
Potassium: 3.5 mmol/L (ref 3.5–5.1)
Sodium: 139 mmol/L (ref 135–145)

## 2021-06-14 LAB — ETHANOL: Alcohol, Ethyl (B): <10 mg/dL (ref <10)

## 2021-06-14 MED ORDER — HYDRALAZINE HCL 25 MG PO TABS
25.0000 mg | ORAL_TABLET | Freq: Once | ORAL | Status: AC
  Administered 2021-06-14: 25 mg via ORAL
  Filled 2021-06-14: qty 1

## 2021-06-14 MED ORDER — SODIUM CHLORIDE 0.9 % IV BOLUS
1000.0000 mL | Freq: Once | INTRAVENOUS | Status: AC
  Administered 2021-06-15: 1000 mL via INTRAVENOUS

## 2021-06-14 NOTE — ED Provider Notes (Signed)
11:27 PM Assumed care from Dr. Almyra Free, please see their note for full history, physical and decision making until this point. In brief this is a 75 y.o. year old male who presented to the ED tonight with Altered Mental Status     AMS, unclear etiology. Pending UA and MRI and likely discharge.   MRI unremarkable for any acute findings and no evidence of urinary tract infection.  Reassessed patient and seems to be closer to baseline per sister's report.  No indication for further work-up or admission hospitalist time.  Was hypertensive and antihypertensives were given but I doubt that is related as his symptoms have resolved without any intervention.  He will follow-up with PCP for same.  Discharge instructions, including strict return precautions for new or worsening symptoms, given. Patient and/or family verbalized understanding and agreement with the plan as described.   Labs, studies and imaging reviewed by myself and considered in medical decision making if ordered. Imaging interpreted by radiology.  Labs Reviewed  CBC WITH DIFFERENTIAL/PLATELET - Abnormal; Notable for the following components:      Result Value   RBC 3.97 (*)    Hemoglobin 11.9 (*)    HCT 37.0 (*)    Monocytes Absolute 1.1 (*)    All other components within normal limits  URINE CULTURE  BASIC METABOLIC PANEL  ETHANOL  RAPID URINE DRUG SCREEN, HOSP PERFORMED  URINALYSIS, ROUTINE W REFLEX MICROSCOPIC    CT Head Wo Contrast  Final Result    MR BRAIN WO CONTRAST    (Results Pending)    No follow-ups on file.    Kirstina Leinweber, Corene Cornea, MD 06/15/21 [phone]

## 2021-06-14 NOTE — ED Provider Notes (Signed)
[facility] EMERGENCY DEPARTMENT Provider Note   CSN: [id number] Arrival date & time: 06/14/21  1924     History Chief Complaint  Patient presents with   Altered Mental Status    Aaron Gonzalez is a 75 y.o. male.  Patient presents to the ER for concern for anxiety.  He states he just did not feel right.  EMS states that the sister who lives with the patient was concerned that he was last normal last night, however this morning he just was not behaving like he typically does.  When asked the patient about this, he just laughs and states that she is overreacting.  He denies any headache or chest pain or abdominal pain.  No reports of fevers or cough or vomiting or diarrhea.  When asked why he called the ambulance the patient states that he was looking for a telephone but could not find 1 and became more anxious, ultimately feeling the need to call the ambulance.      Past Medical History:  Diagnosis Date   CLL (chronic lymphocytic leukemia) ([place])    DVT (deep venous thrombosis) ([facility])    History of rectal cancer    Hypertension    Stroke [facility])     Patient Active Problem List   Diagnosis Date Noted   CLL (chronic lymphocytic leukemia) ([place]) 04/16/2021   History of stroke 04/16/2021   History of rectal cancer 04/16/2021   Encounter for antineoplastic chemotherapy 07/37/1062   Acute metabolic encephalopathy 69/48/5462    No past surgical history on file.     Family History  Problem Relation Age of Onset   Heart failure Mother    Stroke Father     Social History   Tobacco Use   Smoking status: Former    Packs/day: 0.50    Years: 10.00    Pack years: 5.00    Types: Cigarettes    Passive exposure: Never   Smokeless tobacco: Never  Vaping Use   Vaping Use: Never used  Substance Use Topics   Alcohol use: Not Currently   Drug use: Not Currently    Home Medications Prior to Admission medications   Medication Sig Start Date End Date Taking?  Authorizing Provider  allopurinol (ZYLOPRIM) 300 MG tablet Take 300 mg by mouth daily.    [provider]  amLODipine (NORVASC) 10 MG tablet Take 1 tablet (10 mg total) by mouth daily. 04/07/21   Shelly Coss, MD  blood glucose meter kit and supplies KIT Dispense based on patient and insurance preference. Use up to four times daily as directed. 04/06/21   Shelly Coss, MD  cloNIDine (CATAPRES) 0.1 MG tablet Take 0.1 mg by mouth daily as needed (If  systolic BP >703).    [provider]  enalapril (VASOTEC) 20 MG tablet Take 20 mg by mouth 2 (two) times daily.    [provider]  ibrutinib (IMBRUVICA) 420 MG TABS Take 420 mg by mouth daily.    [provider]  pantoprazole (PROTONIX) 40 MG tablet Take 40 mg by mouth daily.    [provider]  rosuvastatin (CRESTOR) 20 MG tablet Take 20 mg by mouth at bedtime.    [provider]  valACYclovir (VALTREX) 500 MG tablet Take 500 mg by mouth every 12 (twelve) hours.    [provider]    Allergies    Patient has no known allergies.  Review of Systems   Review of Systems  Constitutional:  Negative for fever.  HENT:  Negative for ear pain and sore throat.   Eyes:  Negative for pain.  Respiratory:  Negative for cough.   Cardiovascular:  Negative for chest pain.  Gastrointestinal:  Negative for abdominal pain.  Genitourinary:  Negative for flank pain.  Musculoskeletal:  Negative for back pain.  Skin:  Negative for color change and rash.  Neurological:  Negative for syncope.  All other systems reviewed and are negative.  Physical Exam Updated Vital Signs BP (!) 170/109   Pulse 67   Temp 98.8 F (37.1 C) (Oral)   Resp (!) 22   Ht 5' 5"  (1.651 m)   Wt 70.3 kg   SpO2 99%   BMI 25.79 kg/m   Physical Exam Constitutional:      Appearance: He is well-developed.  HENT:     Head: Normocephalic.     Nose: Nose normal.  Eyes:     Extraocular Movements: Extraocular movements  intact.  Cardiovascular:     Rate and Rhythm: Normal rate.  Pulmonary:     Effort: Pulmonary effort is normal.  Skin:    Coloration: Skin is not jaundiced.  Neurological:     General: No focal deficit present.     Mental Status: He is alert and oriented to person, place, and time. Mental status is at baseline.     Cranial Nerves: No cranial nerve deficit.     Motor: No weakness.     Comments: And workPatient is otherwise awake and alert and oriented to time person and place.  He is able to tell me he is retired, worked in [place] prior to his retirement and has been living with his sister for the past 4 months.    ED Results / Procedures / Treatments   Labs (all labs ordered are listed, but only abnormal results are displayed) Labs Reviewed  CBC WITH DIFFERENTIAL/PLATELET - Abnormal; Notable for the following components:      Result Value   RBC 3.97 (*)    Hemoglobin 11.9 (*)    HCT 37.0 (*)    Monocytes Absolute 1.1 (*)    All other components within normal limits  URINE CULTURE  BASIC METABOLIC PANEL  ETHANOL  RAPID URINE DRUG SCREEN, HOSP PERFORMED  URINALYSIS, ROUTINE W REFLEX MICROSCOPIC    EKG EKG Interpretation  Date/Time:  Thursday June 14 2021 20:06:48 EDT Ventricular Rate:  68 PR Interval:  183 QRS Duration: 96 QT Interval:  394 QTC Calculation: 419 R Axis:   -32 Text Interpretation: Sinus rhythm Ventricular premature complex Left axis deviation Probable anteroseptal infarct, old Confirmed by Thamas Jaegers ([id number]) on 06/14/2021 8:35:54 PM  Radiology CT Head Wo Contrast  Result Date: 06/14/2021 CLINICAL DATA:  Mental status change of unknown cause. Hypertension. EXAM: CT HEAD WITHOUT CONTRAST TECHNIQUE: Contiguous axial images were obtained from the base of the skull through the vertex without intravenous contrast. COMPARISON:  CT head 03/31/2021.  MRI brain 03/31/2021. FINDINGS: Brain: Diffuse cerebral atrophy. Ventricular dilatation consistent with central  atrophy. Low-attenuation changes in the deep white matter consistent with small vessel ischemia. Old lacunar infarcts in the basal ganglia. No mass-effect or midline shift. No abnormal extra-axial fluid collections. Gray-white matter junctions are distinct. Basal cisterns are not effaced. No acute intracranial hemorrhage. Vascular: Moderate intracranial arterial vascular calcifications. Skull: The calvarium appears intact. Sinuses/Orbits: Opacification of the right paranasal sinuses and right mastoid effusions. Other: None. IMPRESSION: 1. No acute intracranial abnormalities. Chronic diffuse cerebral atrophy and small vessel ischemic changes. 2. Opacification of  the right paranasal sinuses and right mastoid effusions, likely inflammatory. Electronically Signed   By: Lucienne Capers M.D.   On: 06/14/2021 20:59    Procedures Procedures   Medications Ordered in ED Medications  sodium chloride 0.9 % bolus 1,000 mL (has no administration in time range)  hydrALAZINE (APRESOLINE) tablet 25 mg (25 mg Oral Given 06/14/21 2219)    ED Course  I have reviewed the triage vital signs and the nursing notes.  Pertinent labs & imaging results that were available during my care of the patient were reviewed by me and considered in my medical decision making (see chart for details).    MDM Rules/Calculators/A&P                           Review of records show similar admission in June 2022 patient had episodes of confusion.  However, his sister who lives with him appears concerned that something is "off". He has a known history of dementia, as well as a history of hemorrhagic stroke in the past.    CT imaging is unremarkable.  Labs show no acute findings.  Urinalysis and MRI of the brain pending.  Patient signed to oncoming provider.   Final Clinical Impression(s) / ED Diagnoses Final diagnoses:  Confusion    Rx / DC Orders ED Discharge Orders     None        Luna Fuse, MD 06/14/21 2258

## 2021-06-14 NOTE — ED Triage Notes (Signed)
Pt bib [facility] c/o ams starting this morning. Pt A&Ox4 at baseline but per sister pt has not been able to answer questions correctly today. Pt also noted to be hypertensive at 236/120 per ems. Hx of HTN - sister does not think pt took his medication today   HR: 61  CBG: 92

## 2021-06-15 LAB — URINALYSIS, ROUTINE W REFLEX MICROSCOPIC
Bilirubin Urine: NEGATIVE
Glucose, UA: NEGATIVE mg/dL
Hgb urine dipstick: NEGATIVE
Ketones, ur: 5 mg/dL — AB
Leukocytes,Ua: NEGATIVE
Nitrite: NEGATIVE
Protein, ur: NEGATIVE mg/dL
Specific Gravity, Urine: 1.01 (ref 1.005–1.030)
pH: 7 (ref 5.0–8.0)

## 2021-06-15 LAB — RAPID URINE DRUG SCREEN, HOSP PERFORMED
Amphetamines: NOT DETECTED
Barbiturates: NOT DETECTED
Benzodiazepines: NOT DETECTED
Cocaine: NOT DETECTED
Opiates: NOT DETECTED
Tetrahydrocannabinol: NOT DETECTED

## 2021-06-15 MED ORDER — CLONIDINE HCL 0.1 MG PO TABS
0.1000 mg | ORAL_TABLET | Freq: Once | ORAL | Status: AC
  Administered 2021-06-15: 0.1 mg via ORAL
  Filled 2021-06-15: qty 1

## 2021-06-15 NOTE — Progress Notes (Signed)
Cardiology Office Note   Date:  06/19/2021   ID:  Aaron Gonzalez, DOB 09/27/1946, MRN [id number]  PCP:  Chaney Malling, PA  Cardiologist:   Farrel Guimond Martinique, MD   Chief Complaint  Patient presents with   Hypertension       History of Present Illness: Aaron Gonzalez is a 75 y.o. male who is seen for evaluation of labile HTN . He has a history of CLL, rectal CA, HTN, and DVT.  Patient states he had a stroke 4 years ago while in [place]. He moved here to be with family and lives with his sister. He reports BP has been difficult to control. He is on enalapril and amlodipine. Has clonidine to take for high BP and takes this about twice a week. Reports BP at home in 140s/85. Recently seen in ED with some confusion. BP very high. CT and MRI done showing chronic changes. He does have chronic LLE swelling. Prior dopplers showed chronic DVT. Not anticoagulated due to history of ?hemorrhagic stroke. Last doppler in August showed no DVT. Denies any chest pain or SOB. No prior cardiac history.     Past Medical History:  Diagnosis Date   CLL (chronic lymphocytic leukemia) ([place])    DVT (deep venous thrombosis) ([facility])    History of rectal cancer    Hypertension    Stroke [facility])     Past Surgical History:  Procedure Laterality Date   COLECTOMY     with colostomy     Current Outpatient Medications  Medication Sig Dispense Refill   allopurinol (ZYLOPRIM) 300 MG tablet Take 300 mg by mouth daily.     amLODipine (NORVASC) 10 MG tablet Take 1 tablet (10 mg total) by mouth daily. 30 tablet 1   blood glucose meter kit and supplies KIT Dispense based on patient and insurance preference. Use up to four times daily as directed. 1 each 0   cloNIDine (CATAPRES) 0.1 MG tablet Take 0.1 mg by mouth daily as needed (If  systolic BP >099).     enalapril (VASOTEC) 20 MG tablet Take 20 mg by mouth 2 (two) times daily.     ibrutinib (IMBRUVICA) 420 MG TABS Take 420 mg by mouth daily.     pantoprazole (PROTONIX)  40 MG tablet Take 40 mg by mouth daily.     rosuvastatin (CRESTOR) 20 MG tablet Take 20 mg by mouth at bedtime.     valACYclovir (VALTREX) 500 MG tablet Take 500 mg by mouth every 12 (twelve) hours.     No current facility-administered medications for this visit.    Allergies:   Patient has no known allergies.    Social History:  The patient  reports that he has quit smoking. His smoking use included cigarettes. He has a 5.00 pack-year smoking history. He has never been exposed to tobacco smoke. He has never used smokeless tobacco. He reports that he does not currently use alcohol. He reports that he does not currently use drugs.   Family History:  The patient's family history includes Breast cancer in his sister; Heart failure in his mother; Hypertension in his father and mother; Stroke in his brother and father.    ROS:  Please see the history of present illness.   Otherwise, review of systems are positive for none.   All other systems are reviewed and negative.    PHYSICAL EXAM: VS:  BP (!) 160/80   Pulse 73   Resp 20   Ht _0  (  1.651 m)   Wt 153 lb (69.4 kg)   SpO2 94%   BMI 25.46 kg/m  , BMI Body mass index is 25.46 kg/m. GEN: Well nourished, well developed, in no acute distress HEENT: normal Neck: no JVD, carotid bruits, or masses Cardiac: RRR; no murmurs, rubs, or gallops,2+ LLE edema distally Respiratory:  clear to auscultation bilaterally, normal work of breathing GI: soft, nontender, nondistended, + BS. + colostomy MS: no deformity or atrophy Skin: warm and dry, no rash Neuro:  Strength and sensation are intact Psych: euthymic mood, full affect   EKG:  EKG is not ordered today. The ekg ordered 06/14/21 demonstrates NSR with PVCs rate 68. Old septal infarct. I have personally reviewed and interpreted this study.    Recent Labs: 04/01/2021: TSH 2.606 04/02/2021: ALT 14; Magnesium 1.6 06/14/2021: BUN 12; Creatinine, Ser 0.94; Hemoglobin 11.9; Platelets 178;  Potassium 3.5; Sodium 139    Lipid Panel No results found for: CHOL, TRIG, HDL, CHOLHDL, VLDL, LDLCALC, LDLDIRECT    Wt Readings from Last 3 Encounters:  06/19/21 153 lb (69.4 kg)  06/14/21 155 lb (70.3 kg)  05/22/21 154 lb (69.9 kg)      Other studies Reviewed: Additional studies/ records that were reviewed today include: none. Review of the above records demonstrates: N/A   ASSESSMENT AND PLAN:  1.  HTN poorly controlled. Recommend addition of Coreg 6.25 mg bid. Continue current therapy. Discussed importance of low sodium diet. Will arrange follow up in HTN clinic.  2. History of hemorrhagic lacunar stroke 3. Abnormal Ecg. ? Old septal infarct. Will check Echo 4. History of rectal CA s/p colectomy 5. Chronic LLE DVT.    Current medicines are reviewed at length with the patient today.  The patient does not have concerns regarding medicines.  The following changes have been made:  add Coreg 6.25 mg bid  Labs/ tests ordered today include: Needs lipid panel checked. Will order.  No orders of the defined types were placed in this encounter.    Disposition:   FU HTN clinic and then APP.   Signed, Janiyah Beery Martinique, MD  06/19/2021 9:30 AM    [place] [address], [place], [place], [address] Phone [phone], Fax [phone]

## 2021-06-15 NOTE — ED Notes (Signed)
Patient verbalizes understanding of discharge instructions. Opportunity for questioning and answers were provided. Armband removed by staff, pt discharged from ED via wheelchair.  

## 2021-06-19 ENCOUNTER — Ambulatory Visit: Payer: Medicare Other | Admitting: Cardiology

## 2021-06-19 ENCOUNTER — Other Ambulatory Visit: Payer: Self-pay

## 2021-06-19 ENCOUNTER — Encounter: Payer: Self-pay | Admitting: Cardiology

## 2021-06-19 VITALS — BP 160/80 | HR 73 | Resp 20 | Ht 65.0 in | Wt 153.0 lb

## 2021-06-19 DIAGNOSIS — I10 Essential (primary) hypertension: Secondary | ICD-10-CM

## 2021-06-19 DIAGNOSIS — I119 Hypertensive heart disease without heart failure: Secondary | ICD-10-CM | POA: Diagnosis not present

## 2021-06-19 DIAGNOSIS — Z8673 Personal history of transient ischemic attack (TIA), and cerebral infarction without residual deficits: Secondary | ICD-10-CM | POA: Diagnosis not present

## 2021-06-19 DIAGNOSIS — R9431 Abnormal electrocardiogram [ECG] [EKG]: Secondary | ICD-10-CM | POA: Diagnosis not present

## 2021-06-19 MED ORDER — CARVEDILOL 6.25 MG PO TABS: 6.2500 mg | ORAL_TABLET | Freq: Two times a day (BID) | ORAL | 3 refills | Status: DC

## 2021-06-19 NOTE — Addendum Note (Signed)
Addended by: Kathyrn Lass on: 06/19/2021 09:44 AM   Modules accepted: Orders

## 2021-06-19 NOTE — Patient Instructions (Addendum)
Keep checking your blood pressure at home and keep a diary to bring with you to visits.   Also bring your BP cuff with you on next visit so we can make sure it is reading accurately  We will add Carvedilol 6.25 mg twice a day for blood pressure  Continue your other therapy  Avoid salt  We will check an Echocardiogram  Follow up in our hypertension clinic  Odie Sera NP

## 2021-06-19 NOTE — Addendum Note (Signed)
Addended by: Kathyrn Lass on: 06/19/2021 10:03 AM   Modules accepted: Orders

## 2021-06-23 DIAGNOSIS — Z85048 Personal history of other malignant neoplasm of rectum, rectosigmoid junction, and anus: Secondary | ICD-10-CM | POA: Insufficient documentation

## 2021-06-23 DIAGNOSIS — I679 Cerebrovascular disease, unspecified: Secondary | ICD-10-CM | POA: Insufficient documentation

## 2021-06-28 LAB — LIPID PANEL
Chol/HDL Ratio: 2.4 ratio (ref 0.0–5.0)
Cholesterol, Total: 106 mg/dL (ref 100–199)
HDL: 45 mg/dL (ref >39)
LDL Chol Calc (NIH): 51 mg/dL (ref 0–99)
Triglycerides: 33 mg/dL (ref 0–149)
VLDL Cholesterol Cal: 10 mg/dL (ref 5–40)

## 2021-06-28 LAB — COMPREHENSIVE METABOLIC PANEL
ALT: 12 IU/L (ref 0–44)
AST: 19 IU/L (ref 0–40)
Albumin/Globulin Ratio: 1.7 (ref 1.2–2.2)
Albumin: 4.3 g/dL (ref 3.7–4.7)
Alkaline Phosphatase: 64 IU/L (ref 44–121)
BUN/Creatinine Ratio: 12 (ref 10–24)
BUN: 11 mg/dL (ref 8–27)
Bilirubin Total: 1.1 mg/dL (ref 0.0–1.2)
CO2: 24 mmol/L (ref 20–29)
Calcium: 9.1 mg/dL (ref 8.6–10.2)
Chloride: 102 mmol/L (ref 96–106)
Creatinine, Ser: 0.93 mg/dL (ref 0.76–1.27)
Globulin, Total: 2.6 g/dL (ref 1.5–4.5)
Glucose: 62 mg/dL — ABNORMAL LOW (ref 65–99)
Potassium: 4 mmol/L (ref 3.5–5.2)
Sodium: 139 mmol/L (ref 134–144)
Total Protein: 6.9 g/dL (ref 6.0–8.5)
eGFR: 86 mL/min/{1.73_m2} (ref >59)

## 2021-07-03 NOTE — Progress Notes (Signed)
Hypertension Clinic Initial Assessment:    Date:  07/04/2021   ID:  CRIAG WICKLUND, DOB 09-05-1946, MRN [id number]  PCP:  Chaney Malling, PA  Cardiologist:  Peter Martinique, MD  Nephrologist:  Referring MD: Chaney Malling, PA   CC: Hypertension  History of Present Illness:    Aaron Gonzalez is a 75 y.o. male with a hx of labile hypertension, CLL, rectal cancer, and CVA here to establish care in the hypertension clinic.   He had CVA in 2018 while he was in [place].  He moved to [place] area to be closer to family and lives with his sister.  He notes that his blood pressure has been difficult to control.  He is on enalapril and amlodipine.  He was prescribed clonidine to take for high blood pressure and is previously needed to take it about 2 times per week.  His blood pressure at home has been in the 140s/80s.  He was seen in the emergency department due to confusion.  Blood pressure at that time was very high.  A CT and MRI were performed and showed chronic changes.  He was also noted to have left lower extremity swelling.  Prior Doppler studies have shown chronic DVT.  He has not been anticoagulated due to his history of hemorrhagic stroke.  His last Doppler study was in August and showed no DVT.  He was seen by Dr. Martinique on 06/19/2021.  During that time he denied chest pain shortness of breath.  He had no prior cardiac history.  Carvedilol 6.25 mg was added to his medication regimen.    He presents the clinic today for follow-up evaluation and states he is tolerating his carvedilol well.  He reports that initially in the morning before he takes his medication his blood pressure runs in the 140s over 80s.  When he rechecks his blood pressure later in the day it ranges in the 130s over 70s-80s.  We reviewed his current cardiac medications.  He reports that he takes his clonidine for blood pressure greater than 175 systolic 1-2 times per week.  He does enjoys salty snacks and admits to  dietary indiscretion.  For pain he takes Tylenol.  He has been exercising regularly, 4 times per week at the [facility], 1 hour at a time.  We reviewed the PREP program.  He does note some increased stress related to selling his house in [place].  He has been waiting on a closing date for 2 months.  His pharmacy was contacted and he reports compliance with his amlodipine, carvedilol, clonidine, and enalapril.  I will order a renal ultrasound, start HCTZ 12.5 mg daily, give him the salty 6 diet sheet and the hypertension booklet, and enroll him in the Prep program and give him a blood pressure log.  We will have him follow-up with clinical pharmacy in 1 month.  Notes he has had high blood pressure for about 58yr.  Today he denies chest pain, shortness of breath, lower extremity edema, fatigue, palpitations, melena, hematuria, hemoptysis, diaphoresis, weakness, presyncope, syncope, orthopnea, and PND.  Previous antihypertensives:  Amlodipine 10 mg daily-currently taking Carvedilol 6.25 mg twice daily-currently taking Enalapril 20 mg twice daily-currently taking Clonidine 0.1 mg as needed for systolic blood pressure greater than 160-currently taking as needed  Past Medical History:  Diagnosis Date   CLL (chronic lymphocytic leukemia) ([facility])    DVT (deep venous thrombosis) ([facility])    History of rectal cancer  Hypertension    Stroke [facility])     Past Surgical History:  Procedure Laterality Date   COLECTOMY     with colostomy    Current Medications: Current Meds  Medication Sig   allopurinol (ZYLOPRIM) 300 MG tablet Take 300 mg by mouth daily.   amLODipine (NORVASC) 10 MG tablet Take 1 tablet (10 mg total) by mouth daily.   blood glucose meter kit and supplies KIT Dispense based on patient and insurance preference. Use up to four times daily as directed.   carvedilol (COREG) 6.25 MG tablet Take 1 tablet (6.25 mg total) by mouth 2 (two) times daily.   cloNIDine (CATAPRES) 0.1 MG tablet  Take 0.1 mg by mouth daily as needed (If  systolic BP >353).   enalapril (VASOTEC) 20 MG tablet Take 20 mg by mouth 2 (two) times daily.   hydrochlorothiazide (HYDRODIURIL) 12.5 MG tablet Take 1 tablet (12.5 mg total) by mouth daily.   ibrutinib (IMBRUVICA) 420 MG TABS Take 420 mg by mouth daily.   pantoprazole (PROTONIX) 40 MG tablet Take 40 mg by mouth daily.   rosuvastatin (CRESTOR) 20 MG tablet Take 20 mg by mouth at bedtime.   valACYclovir (VALTREX) 500 MG tablet Take 500 mg by mouth every 12 (twelve) hours.     Allergies:   Patient has no known allergies.   Social History   Socioeconomic History   Marital status: Single    Spouse name: Not on file   Number of children: 2   Years of education: Not on file   Highest education level: Not on file  Occupational History   Not on file  Tobacco Use   Smoking status: Former    Packs/day: 0.50    Years: 10.00    Pack years: 5.00    Types: Cigarettes    Passive exposure: Never   Smokeless tobacco: Never  Vaping Use   Vaping Use: Never used  Substance and Sexual Activity   Alcohol use: Not Currently   Drug use: Not Currently   Sexual activity: Not on file  Other Topics Concern   Not on file  Social History Narrative   Retired Librarian, 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  ROS:   Please see the history of present illness.     All other systems reviewed and are negative.  EKGs/Labs/Other Studies Reviewed:    EKG: None today.  Recent Labs: 04/01/2021: TSH 2.606 04/02/2021: Magnesium 1.6 06/14/2021: Hemoglobin 11.9;  Platelets 178 06/28/2021: ALT 12; BUN 11; Creatinine, Ser 0.93; Potassium 4.0; Sodium 139   Recent Lipid Panel    Component Value Date/Time   CHOL 106 06/28/2021 0954   TRIG 33 06/28/2021 0954   HDL 45 06/28/2021 0954   CHOLHDL 2.4 06/28/2021 0954   LDLCALC 51 06/28/2021 0954    Physical Exam:    VS:  BP (!) 150/90   Pulse 71   Ht 5' 5"  (1.651 m)   Wt 156 lb 6.4 oz (70.9 kg)   SpO2 99%   BMI 26.03 kg/m     Wt Readings from Last  3 Encounters:  07/04/21 156 lb 6.4 oz (70.9 kg)  06/19/21 153 lb (69.4 kg)  06/14/21 155 lb (70.3 kg)     GEN:  Well nourished, well developed in no acute distress HEENT: Normal NECK: No JVD; No carotid bruits LYMPHATICS: No lymphadenopathy CARDIAC: RRR, no murmurs, rubs, gallops, +1 bilateral lower extremity pitting edema left greater than right. RESPIRATORY:  Clear to auscultation without rales, wheezing or rhonchi  ABDOMEN: Soft, non-tender, non-distended MUSCULOSKELETAL:  No edema; No deformity  SKIN: Warm and dry NEUROLOGIC:  Alert and oriented x 3 PSYCHIATRIC:  Normal affect       Assessment/PLAN:    Resistant hypertension-BP today 150/90.  Now with more consistent control with the addition of carvedilol. Continue amlodipine, carvedilol, enalapril, clonidine as needed Heart healthy low-sodium diet-salty 6 given Increase physical activity as tolerated Maintain blood pressure log Secondary causes of hypertension reviewed Start HCTZ 12.5 mg daily Ordered BMP in 1 week Disposition: Follow-up with pharmacy hypertension clinic 1 months.  Screening for Secondary Hypertension:   Causes 07/04/2021  Drugs/Herbals Screened  Renovascular HTN (No Data)     - Comments doppler ordered  Thyroid Disease Screened  Compliance Screened    Relevant Labs/Studies: Basic Labs Latest Ref Rng & Units 06/28/2021 06/14/2021 04/03/2021  Sodium 134 - 144 mmol/L 139 139 137  Potassium 3.5 - 5.2 mmol/L 4.0 3.5 3.5  Creatinine 0.76 - 1.27 mg/dL 0.93 0.94 0.83     Thyroid  Latest Ref Rng & Units 04/01/2021  TSH 0.350 - 4.500 uIU/mL 2.606             Renovascular  07/04/2021  Renal Artery [name] Completed Yes     Disposition:    FU with MD/PharmD in 1 month in-office   Medication Adjustments/Labs and Tests Ordered: Current medicines are reviewed at length with the patient today.  Concerns regarding medicines are outlined above.  Orders Placed This Encounter  Procedures   Basic metabolic panel   Amb Referral To Provider Referral Exercise Program (P.R.E.P)   VAS US RENAL ARTERY DUPLEX    Meds ordered this encounter  Medications   hydrochlorothiazide (HYDRODIURIL) 12.5 MG tablet    Sig: Take 1 tablet (12.5 mg total) by mouth daily.    Dispense:  90 tablet    Refill:  2      Signed, Deberah Pelton, NP  07/04/2021 4:51 PM    [facility]

## 2021-07-04 ENCOUNTER — Other Ambulatory Visit: Payer: Self-pay

## 2021-07-04 ENCOUNTER — Encounter (HOSPITAL_BASED_OUTPATIENT_CLINIC_OR_DEPARTMENT_OTHER): Payer: Self-pay | Admitting: General Practice

## 2021-07-04 ENCOUNTER — Ambulatory Visit (HOSPITAL_BASED_OUTPATIENT_CLINIC_OR_DEPARTMENT_OTHER): Payer: Medicare Other | Admitting: General Practice

## 2021-07-04 VITALS — BP 150/90 | HR 71 | Ht 65.0 in | Wt 156.4 lb

## 2021-07-04 DIAGNOSIS — I10 Essential (primary) hypertension: Secondary | ICD-10-CM | POA: Diagnosis not present

## 2021-07-04 DIAGNOSIS — Z5181 Encounter for therapeutic drug level monitoring: Secondary | ICD-10-CM | POA: Diagnosis not present

## 2021-07-04 MED ORDER — HYDROCHLOROTHIAZIDE 12.5 MG PO TABS: 12.5000 mg | ORAL_TABLET | Freq: Every day | ORAL | 2 refills | Status: DC

## 2021-07-04 NOTE — Patient Instructions (Addendum)
Medication Instructions:  START HYDROCHLOROTHIAZIDE 12.5 MG DAILY    Labwork: BMET IN 1 WEEK    Testing/Procedures: Your physician has requested that you have a renal artery duplex. During this test, an ultrasound is used to evaluate blood flow to the kidneys. Allow one hour for this exam. Do not eat after midnight the day before and avoid carbonated beverages. Take your medications as you usually do.   Follow-Up: 08/01/2021 2:00 PM WITH PHARM D AT [facility] will receive a phone call from the PREP exercise and nutrition program to schedule an initial assessment.   Special Instructions:   MONITOR YOUR BLOOD PRESSURE TWICE A DAY, LOG IN THE BOOK PROVIDED. BRING THE BOOK AND YOUR BLOOD PRESSURE MACHINE TO YOUR FOLLOW UP IN 1 MONTH   DASH Eating Plan DASH stands for "Dietary Approaches to Stop Hypertension." The DASH eating plan is a healthy eating plan that has been shown to reduce high blood pressure (hypertension). It may also reduce your risk for type 2 diabetes, heart disease, and stroke. The DASH eating plan may also help with weight loss. What are tips for following this plan?  General guidelines Avoid eating more than 2,300 mg (milligrams) of salt (sodium) a day. If you have hypertension, you may need to reduce your sodium intake to 1,500 mg a day. Limit alcohol intake to no more than 1 drink a day for nonpregnant women and 2 drinks a day for men. One drink equals 12 oz of beer, 5 oz of wine, or 1 oz of hard liquor. Work with your health care provider to maintain a healthy body weight or to lose weight. Ask what an ideal weight is for you. Get at least 30 minutes of exercise that causes your heart to beat faster (aerobic exercise) most days of the week. Activities may include walking, swimming, or biking. Work with your health care provider or diet and nutrition specialist (dietitian) to adjust your eating plan to your individual calorie needs. Reading food  labels  Check food labels for the amount of sodium per serving. Choose foods with less than 5 percent of the Daily Value of sodium. Generally, foods with less than 300 mg of sodium per serving fit into this eating plan. To find whole grains, look for the word "whole" as the first word in the ingredient list. Shopping Buy products labeled as "low-sodium" or "no salt added." Buy fresh foods. Avoid canned foods and premade or frozen meals. Cooking Avoid adding salt when cooking. Use salt-free seasonings or herbs instead of table salt or sea salt. Check with your health care provider or pharmacist before using salt substitutes. Do not fry foods. Cook foods using healthy methods such as baking, boiling, grilling, and broiling instead. Cook with heart-healthy oils, such as olive, canola, soybean, or sunflower oil. Meal planning Eat a balanced diet that includes: 5 or more servings of fruits and vegetables each day. At each meal, try to fill half of your plate with fruits and vegetables. Up to 6-8 servings of whole grains each day. Less than 6 oz of lean meat, poultry, or fish each day. A 3-oz serving of meat is about the same size as a deck of cards. One egg equals 1 oz. 2 servings of low-fat dairy each day. A serving of nuts, seeds, or beans 5 times each week. Heart-healthy fats. Healthy fats called Omega-3 fatty acids are found in foods such as flaxseeds and coldwater fish, like sardines, salmon, and mackerel. Limit how  much you eat of the following: Canned or prepackaged foods. Food that is high in trans fat, such as fried foods. Food that is high in saturated fat, such as fatty meat. Sweets, desserts, sugary drinks, and other foods with added sugar. Full-fat dairy products. Do not salt foods before eating. Try to eat at least 2 vegetarian meals each week. Eat more home-cooked food and less restaurant, buffet, and fast food. When eating at a restaurant, ask that your food be prepared with  less salt or no salt, if possible. What foods are recommended? The items listed may not be a complete list. Talk with your dietitian about what dietary choices are best for you. Grains Whole-grain or whole-wheat bread. Whole-grain or whole-wheat pasta. Brown rice. Modena Morrow. Bulgur. Whole-grain and low-sodium cereals. Pita bread. Low-fat, low-sodium crackers. Whole-wheat flour tortillas. Vegetables Fresh or frozen vegetables (raw, steamed, roasted, or grilled). Low-sodium or reduced-sodium tomato and vegetable juice. Low-sodium or reduced-sodium tomato sauce and tomato paste. Low-sodium or reduced-sodium canned vegetables. Fruits All fresh, dried, or frozen fruit. Canned fruit in natural juice (without added sugar). Meat and other protein foods Skinless chicken or [place]. Ground chicken or [place]. Pork with fat trimmed off. Fish and seafood. Egg whites. Dried beans, peas, or lentils. Unsalted nuts, nut butters, and seeds. Unsalted canned beans. Lean cuts of beef with fat trimmed off. Low-sodium, lean deli meat. Dairy Low-fat (1%) or fat-free (skim) milk. Fat-free, low-fat, or reduced-fat cheeses. Nonfat, low-sodium ricotta or cottage cheese. Low-fat or nonfat yogurt. Low-fat, low-sodium cheese. Fats and oils Soft margarine without trans fats. Vegetable oil. Low-fat, reduced-fat, or light mayonnaise and salad dressings (reduced-sodium). Canola, safflower, olive, soybean, and sunflower oils. Avocado. Seasoning and other foods Herbs. Spices. Seasoning mixes without salt. Unsalted popcorn and pretzels. Fat-free sweets. What foods are not recommended? The items listed may not be a complete list. Talk with your dietitian about what dietary choices are best for you. Grains Baked goods made with fat, such as croissants, muffins, or some breads. Dry pasta or rice meal packs. Vegetables Creamed or fried vegetables. Vegetables in a cheese sauce. Regular canned vegetables (not low-sodium or  reduced-sodium). Regular canned tomato sauce and paste (not low-sodium or reduced-sodium). Regular tomato and vegetable juice (not low-sodium or reduced-sodium). Angie Fava. Olives. Fruits Canned fruit in a light or heavy syrup. Fried fruit. Fruit in cream or butter sauce. Meat and other protein foods Fatty cuts of meat. Ribs. Fried meat. Berniece Salines. Sausage. Bologna and other processed lunch meats. Salami. Fatback. Hotdogs. Bratwurst. Salted nuts and seeds. Canned beans with added salt. Canned or smoked fish. Whole eggs or egg yolks. Chicken or [place] with skin. Dairy Whole or 2% milk, cream, and half-and-half. Whole or full-fat cream cheese. Whole-fat or sweetened yogurt. Full-fat cheese. Nondairy creamers. Whipped toppings. Processed cheese and cheese spreads. Fats and oils Butter. Stick margarine. Lard. Shortening. Ghee. Bacon fat. Tropical oils, such as coconut, palm kernel, or palm oil. Seasoning and other foods Salted popcorn and pretzels. Onion salt, garlic salt, seasoned salt, table salt, and sea salt. Worcestershire sauce. Tartar sauce. Barbecue sauce. Teriyaki sauce. Soy sauce, including reduced-sodium. Steak sauce. Canned and packaged gravies. Fish sauce. Oyster sauce. Cocktail sauce. Horseradish that you find on the shelf. Ketchup. Mustard. Meat flavorings and tenderizers. Bouillon cubes. Hot sauce and Tabasco sauce. Premade or packaged marinades. Premade or packaged taco seasonings. Relishes. Regular salad dressings. Where to find more information: National Heart, Lung, and [place]: https://wilson-eaton.com/ American Heart Association: www.heart.org Summary The DASH eating plan is  a healthy eating plan that has been shown to reduce high blood pressure (hypertension). It may also reduce your risk for type 2 diabetes, heart disease, and stroke. With the DASH eating plan, you should limit salt (sodium) intake to 2,300 mg a day. If you have hypertension, you may need to reduce your sodium intake to  1,500 mg a day. When on the DASH eating plan, aim to eat more fresh fruits and vegetables, whole grains, lean proteins, low-fat dairy, and heart-healthy fats. Work with your health care provider or diet and nutrition specialist (dietitian) to adjust your eating plan to your individual calorie needs. This information is not intended to replace advice given to you by your health care provider. Make sure you discuss any questions you have with your health care provider. Document Released: 09/26/2011 Document Revised: 09/19/2017 Document Reviewed: 09/30/2016 Elsevier Patient Education  2020 [employer].

## 2021-07-05 ENCOUNTER — Telehealth: Payer: Self-pay

## 2021-07-05 NOTE — Telephone Encounter (Signed)
Call place reference PREP Explained program and next class start of Oct 3rd at [facility] M[facility] 1p-215pm Wants to participate in program. Intake scheduled for 07/18/21 at 11am at [facility]

## 2021-07-10 ENCOUNTER — Ambulatory Visit (INDEPENDENT_AMBULATORY_CARE_PROVIDER_SITE_OTHER): Payer: Medicare Other

## 2021-07-10 ENCOUNTER — Other Ambulatory Visit: Payer: Self-pay

## 2021-07-10 DIAGNOSIS — I10 Essential (primary) hypertension: Secondary | ICD-10-CM | POA: Diagnosis not present

## 2021-07-12 ENCOUNTER — Ambulatory Visit (HOSPITAL_COMMUNITY): Payer: Medicare Other | Attending: Cardiovascular Disease

## 2021-07-12 ENCOUNTER — Other Ambulatory Visit: Payer: Self-pay

## 2021-07-12 DIAGNOSIS — I119 Hypertensive heart disease without heart failure: Secondary | ICD-10-CM | POA: Insufficient documentation

## 2021-07-12 DIAGNOSIS — Z8673 Personal history of transient ischemic attack (TIA), and cerebral infarction without residual deficits: Secondary | ICD-10-CM | POA: Insufficient documentation

## 2021-07-12 DIAGNOSIS — R9431 Abnormal electrocardiogram [ECG] [EKG]: Secondary | ICD-10-CM | POA: Diagnosis not present

## 2021-07-12 DIAGNOSIS — I10 Essential (primary) hypertension: Secondary | ICD-10-CM | POA: Diagnosis not present

## 2021-07-12 LAB — ECHOCARDIOGRAM COMPLETE
Area-P 1/2: 2.73 cm2
S' Lateral: 2.4 cm

## 2021-07-17 ENCOUNTER — Inpatient Hospital Stay: Payer: Medicare Other

## 2021-07-17 ENCOUNTER — Inpatient Hospital Stay: Payer: Medicare Other | Attending: Internal Medicine | Admitting: Internal Medicine

## 2021-07-17 ENCOUNTER — Other Ambulatory Visit: Payer: Self-pay

## 2021-07-17 VITALS — BP 162/95 | HR 70 | Temp 98.1°F | Resp 20 | Ht 65.0 in | Wt 156.3 lb

## 2021-07-17 DIAGNOSIS — Z86718 Personal history of other venous thrombosis and embolism: Secondary | ICD-10-CM | POA: Insufficient documentation

## 2021-07-17 DIAGNOSIS — C9110 Chronic lymphocytic leukemia of B-cell type not having achieved remission: Secondary | ICD-10-CM

## 2021-07-17 DIAGNOSIS — Z8673 Personal history of transient ischemic attack (TIA), and cerebral infarction without residual deficits: Secondary | ICD-10-CM | POA: Diagnosis not present

## 2021-07-17 DIAGNOSIS — Z923 Personal history of irradiation: Secondary | ICD-10-CM | POA: Insufficient documentation

## 2021-07-17 DIAGNOSIS — Z79899 Other long term (current) drug therapy: Secondary | ICD-10-CM | POA: Insufficient documentation

## 2021-07-17 DIAGNOSIS — I10 Essential (primary) hypertension: Secondary | ICD-10-CM | POA: Diagnosis not present

## 2021-07-17 DIAGNOSIS — Z85048 Personal history of other malignant neoplasm of rectum, rectosigmoid junction, and anus: Secondary | ICD-10-CM | POA: Insufficient documentation

## 2021-07-17 DIAGNOSIS — D649 Anemia, unspecified: Secondary | ICD-10-CM | POA: Diagnosis not present

## 2021-07-17 DIAGNOSIS — Z5111 Encounter for antineoplastic chemotherapy: Secondary | ICD-10-CM

## 2021-07-17 LAB — CBC WITH DIFFERENTIAL (CANCER CENTER ONLY)
Abs Immature Granulocytes: 0.01 10*3/uL (ref 0.00–0.07)
Basophils Absolute: 0.1 10*3/uL (ref 0.0–0.1)
Basophils Relative: 1 %
Eosinophils Absolute: 0.1 10*3/uL (ref 0.0–0.5)
Eosinophils Relative: 2 %
HCT: 36.5 % — ABNORMAL LOW (ref 39.0–52.0)
Hemoglobin: 12.2 g/dL — ABNORMAL LOW (ref 13.0–17.0)
Immature Granulocytes: 0 %
Lymphocytes Relative: 28 %
Lymphs Abs: 1.7 10*3/uL (ref 0.7–4.0)
MCH: 30 pg (ref 26.0–34.0)
MCHC: 33.4 g/dL (ref 30.0–36.0)
MCV: 89.7 fL (ref 80.0–100.0)
Monocytes Absolute: 0.7 10*3/uL (ref 0.1–1.0)
Monocytes Relative: 11 %
Neutro Abs: 3.7 10*3/uL (ref 1.7–7.7)
Neutrophils Relative %: 58 %
Platelet Count: 183 10*3/uL (ref 150–400)
RBC: 4.07 MIL/uL — ABNORMAL LOW (ref 4.22–5.81)
RDW: 16.3 % — ABNORMAL HIGH (ref 11.5–15.5)
WBC Count: 6.3 10*3/uL (ref 4.0–10.5)
nRBC: 0 % (ref 0.0–0.2)

## 2021-07-17 LAB — CMP (CANCER CENTER ONLY)
ALT: 10 U/L (ref 0–44)
AST: 21 U/L (ref 15–41)
Albumin: 3.9 g/dL (ref 3.5–5.0)
Alkaline Phosphatase: 58 U/L (ref 38–126)
Anion gap: 9 (ref 5–15)
BUN: 14 mg/dL (ref 8–23)
CO2: 28 mmol/L (ref 22–32)
Calcium: 9.9 mg/dL (ref 8.9–10.3)
Chloride: 103 mmol/L (ref 98–111)
Creatinine: 0.9 mg/dL (ref 0.61–1.24)
GFR, Estimated: >60 mL/min (ref >60)
Glucose, Bld: 62 mg/dL — ABNORMAL LOW (ref 70–99)
Potassium: 4.3 mmol/L (ref 3.5–5.1)
Sodium: 140 mmol/L (ref 135–145)
Total Bilirubin: 1.7 mg/dL — ABNORMAL HIGH (ref 0.3–1.2)
Total Protein: 7.3 g/dL (ref 6.5–8.1)

## 2021-07-17 LAB — LACTATE DEHYDROGENASE: LDH: 248 U/L — ABNORMAL HIGH (ref 98–192)

## 2021-07-17 MED ORDER — IMBRUVICA 420 MG PO TABS: 420.0000 mg | ORAL_TABLET | Freq: Every day | ORAL | 3 refills | Status: DC

## 2021-07-17 NOTE — Progress Notes (Signed)
[place] [phone]   [phone]  OFFICE PROGRESS NOTE  [address] [place] [address]  DIAGNOSIS:  1) history of rectal cancer in 2007 status post concurrent chemoradiation followed by surgery. 2) chronic lymphocytic leukemia diagnosed in 2007.  PRIOR THERAPY: None  CURRENT THERAPY: Ibrutinib 420 mg p.o. daily.  INTERVAL HISTORY: Aaron Gonzalez 74 y.o. male returns to the clinic today for follow-up visit accompanied by his sister.  The patient is feeling fine today with no concerning complaints.  He denied having any current chest pain, shortness of breath, cough or hemoptysis.  He denied having any nausea, vomiting, diarrhea or constipation.  He has no significant weight loss or night sweats.  He has no bleeding, bruises or ecchymosis.  He continues to tolerate his treatment with ibrutinib fairly well.  The patient is here today for evaluation and repeat blood work.  MEDICAL HISTORY: Past Medical History:  Diagnosis Date   CLL (chronic lymphocytic leukemia) ([facility])    DVT (deep venous thrombosis) ([facility])    History of rectal cancer    Hypertension    Stroke ([facility])     ALLERGIES:  has No Known Allergies.  MEDICATIONS:  Current Outpatient Medications  Medication Sig Dispense Refill   allopurinol (ZYLOPRIM) 300 MG tablet Take 300 mg by mouth daily.     amLODipine (NORVASC) 10 MG tablet Take 1 tablet (10 mg total) by mouth daily. 30 tablet 1   blood glucose meter kit and supplies KIT Dispense based on patient and insurance preference. Use up to four times daily as directed. 1 each 0   carvedilol (COREG) 6.25 MG tablet Take 1 tablet (6.25 mg total) by mouth 2 (two) times daily. 180 tablet 3   cloNIDine (CATAPRES) 0.1 MG tablet Take 0.1 mg by mouth daily as needed (If  systolic BP >193).     enalapril (VASOTEC) 20 MG tablet Take 20 mg by mouth 2 (two) times daily.     hydrochlorothiazide (HYDRODIURIL) 12.5  MG tablet Take 1 tablet (12.5 mg total) by mouth daily. 90 tablet 2   ibrutinib (IMBRUVICA) 420 MG TABS Take 420 mg by mouth daily.     pantoprazole (PROTONIX) 40 MG tablet Take 40 mg by mouth daily.     rosuvastatin (CRESTOR) 20 MG tablet Take 20 mg by mouth at bedtime.     valACYclovir (VALTREX) 500 MG tablet Take 500 mg by mouth every 12 (twelve) hours.     No current facility-administered medications for this visit.    SURGICAL HISTORY:  Past Surgical History:  Procedure Laterality Date   COLECTOMY     with colostomy    REVIEW OF SYSTEMS:  A comprehensive review of systems was negative.   PHYSICAL EXAMINATION: General appearance: alert, cooperative, and no distress Head: Normocephalic, without obvious abnormality, atraumatic Neck: no adenopathy, no JVD, supple, symmetrical, trachea midline, and thyroid not enlarged, symmetric, no tenderness/mass/nodules Lymph nodes: Cervical, supraclavicular, and axillary nodes normal. Resp: clear to auscultation bilaterally Back: symmetric, no curvature. ROM normal. No CVA tenderness. Cardio: regular rate and rhythm, S1, S2 normal, no murmur, click, rub or gallop GI: soft, non-tender; bowel sounds normal; no masses,  no organomegaly Extremities: extremities normal, atraumatic, no cyanosis or edema  ECOG PERFORMANCE STATUS: 1 - Symptomatic but completely ambulatory  Blood pressure (!) 162/95, pulse 70, temperature 98.1 F (36.7 C), temperature source Oral, resp. rate 20, height 5' 5"  (1.651 m), weight 156  lb 4.8 oz (70.9 kg), SpO2 98 %.  LABORATORY DATA: Lab Results  Component Value Date   WBC 6.3 07/17/2021   HGB 12.2 (L) 07/17/2021   HCT 36.5 (L) 07/17/2021   MCV 89.7 07/17/2021   PLT 183 07/17/2021      Chemistry      Component Value Date/Time   NA 139 06/28/2021 0954   K 4.0 06/28/2021 0954   CL 102 06/28/2021 0954   CO2 24 06/28/2021 0954   BUN 11 06/28/2021 0954   CREATININE 0.93 06/28/2021 0954      Component Value  Date/Time   CALCIUM 9.1 06/28/2021 0954   ALKPHOS 64 06/28/2021 0954   AST 19 06/28/2021 0954   ALT 12 06/28/2021 0954   BILITOT 1.1 06/28/2021 0954       RADIOGRAPHIC STUDIES: ECHOCARDIOGRAM COMPLETE  Result Date: 07/12/2021    ECHOCARDIOGRAM REPORT   Patient Name:   Aaron Gonzalez Date of Exam: 07/12/2021 Medical Rec #:  [id number]      Height:       65.0 in Accession #:    [id number]     Weight:       156.4 lb Date of Birth:  1946/04/08      BSA:          1.782 m Patient Age:    40 years       BP:           150/90 mmHg Patient Gender: M              HR:           68 bpm. Exam Location:  [facility] Procedure: 2D Echo, 3D Echo, Cardiac Doppler and Color Doppler Indications:    R94.31 Abnormal EKG  History:        Patient has no prior history of Echocardiogram examinations.                 Abnormal ECG, Stroke, Signs/Symptoms:Edema; Risk Factors:Family                 History of Coronary Artery Disease, Hypertension and Former                 Smoker. CLL (Leukemia), History of DVT.  Sonographer:    Deliah Boston RDCS Referring Phys: Sherren Kerns CRAIN IMPRESSIONS  1. Left ventricular ejection fraction, by estimation, is 65 to 70%. The left ventricle has normal function. The left ventricle has no regional wall motion abnormalities. Left ventricular diastolic parameters are consistent with Grade I diastolic dysfunction (impaired relaxation).  2. Right ventricular systolic function is normal. The right ventricular size is normal. There is mildly elevated pulmonary artery systolic pressure.  3. Left atrial size was moderately dilated.  4. Right atrial size was moderately dilated.  5. The mitral valve is grossly normal. Mild to moderate mitral valve regurgitation. No evidence of mitral stenosis.  6. The aortic valve is tricuspid. There is mild calcification of the aortic valve. Aortic valve regurgitation is not visualized. No aortic stenosis is present.  7. There is borderline dilatation of the ascending  aorta, measuring 39 mm.  8. The inferior vena cava is normal in size with greater than 50% respiratory variability, suggesting right atrial pressure of 3 mmHg. Comparison(s): No prior Echocardiogram. Conclusion(s)/Recommendation(s): Otherwise normal echocardiogram, with minor abnormalities described in the report. FINDINGS  Left Ventricle: Left ventricular ejection fraction, by estimation, is 65 to 70%. The left ventricle has normal function. The left ventricle has  no regional wall motion abnormalities. The left ventricular internal cavity size was normal in size. There is  no left ventricular hypertrophy. Left ventricular diastolic parameters are consistent with Grade I diastolic dysfunction (impaired relaxation). Right Ventricle: The right ventricular size is normal. Right vetricular wall thickness was not well visualized. Right ventricular systolic function is normal. There is mildly elevated pulmonary artery systolic pressure. The tricuspid regurgitant velocity  is 3.04 m/s, and with an assumed right atrial pressure of 3 mmHg, the estimated right ventricular systolic pressure is 68.3 mmHg. Left Atrium: Left atrial size was moderately dilated. Right Atrium: Right atrial size was moderately dilated. Pericardium: There is no evidence of pericardial effusion. Mitral Valve: The mitral valve is grossly normal. Mild to moderate mitral valve regurgitation. No evidence of mitral valve stenosis. Tricuspid Valve: The tricuspid valve is normal in structure. Tricuspid valve regurgitation is mild . No evidence of tricuspid stenosis. Aortic Valve: The aortic valve is tricuspid. There is mild calcification of the aortic valve. Aortic valve regurgitation is not visualized. No aortic stenosis is present. Pulmonic Valve: The pulmonic valve was grossly normal. Pulmonic valve regurgitation is mild. No evidence of pulmonic stenosis. Aorta: The aortic root, ascending aorta, aortic arch and descending aorta are all structurally normal,  with no evidence of dilitation or obstruction. There is borderline dilatation of the ascending aorta, measuring 39 mm. Venous: The inferior vena cava is normal in size with greater than 50% respiratory variability, suggesting right atrial pressure of 3 mmHg. IAS/Shunts: The atrial septum is grossly normal.  LEFT VENTRICLE PLAX 2D LVIDd:         4.95 cm  Diastology LVIDs:         2.40 cm  LV e' medial:    5.68 cm/s LV PW:         0.90 cm  LV E/e' medial:  10.7 LV IVS:        0.80 cm  LV e' lateral:   10.10 cm/s LVOT diam:     2.30 cm  LV E/e' lateral: 6.0 LV SV:         86 LV SV Index:   48 LVOT Area:     4.15 cm                          3D Volume EF:                         3D EF:        71 %                         LV EDV:       119 ml                         LV ESV:       34 ml                         LV SV:        85 ml RIGHT VENTRICLE RV S prime:     17.30 cm/s TAPSE (M-mode): 3.0 cm LEFT ATRIUM             Index       RIGHT ATRIUM           Index LA diam:  4.80 cm 2.69 cm/m  RA Area:     20.80 cm LA Vol (A2C):   54.2 ml 30.42 ml/m RA Volume:   63.40 ml  35.58 ml/m LA Vol (A4C):   66.3 ml 37.21 ml/m LA Biplane Vol: 61.1 ml 34.29 ml/m  AORTIC VALVE LVOT Vmax:   98.15 cm/s LVOT Vmean:  65.200 cm/s LVOT VTI:    0.208 m  AORTA Ao Root diam: 3.30 cm Ao Asc diam:  3.85 cm MITRAL VALVE               TRICUSPID VALVE MV Area (PHT): cm         TR Peak grad:   37.0 mmHg MV Decel Time: 278 msec    TR Vmax:        304.00 cm/s MV E velocity: 61.00 cm/s MV A velocity: 93.20 cm/s  SHUNTS MV E/A ratio:  0.65        Systemic VTI:  0.21 m                            Systemic Diam: 2.30 cm Buford Dresser MD Electronically signed by Buford Dresser MD Signature Date/Time: 07/12/2021/5:09:52 PM    Final    VAS US RENAL ARTERY DUPLEX  Result Date: 07/10/2021 ABDOMINAL VISCERAL Patient Name:  Aaron Gonzalez  Date of Exam:   07/10/2021 Medical Rec #: [id number]       Accession #:    [id number] Date of Birth:  1946/02/16       Patient Gender: M Patient Age:   55 years Exam Location:  [facility] Procedure:      VAS US RENAL ARTERY DUPLEX Referring Phys: [id number] [place] -------------------------------------------------------------------------------- Indications: Patient states he is on three medications for his HTN. He denies              any abdominal pain. High Risk Factors: Hypertension, past history of smoking. Limitations: Air/bowel gas. Performing Technologist: Leavy Cella RDCS Supporting Technologist: Wilkie Aye RVT  Examination Guidelines: A complete evaluation includes B-mode imaging, spectral Doppler, color Doppler, and power Doppler as needed of all accessible portions of each vessel. Bilateral testing is considered an integral part of a complete examination. Limited examinations for reoccurring indications may be performed as noted.  Duplex Findings: +--------------------+--------+--------+------+---------------+ Mesenteric          PSV cm/sEDV cm/sPlaque   Comments     +--------------------+--------+--------+------+---------------+ Aorta at SMA           74                 2.2 cm x 2.4 cm +--------------------+--------+--------+------+---------------+ Aorta Distal           42                 2.3 cm x 2.4 cm +--------------------+--------+--------+------+---------------+ Celiac Artery Origin  109                                 +--------------------+--------+--------+------+---------------+ SMA Origin            113                                 +--------------------+--------+--------+------+---------------+  +------------------+--------+--------+-------+ Right Renal ArteryPSV cm/sEDV cm/sComment +------------------+--------+--------+-------+ Origin               50  13           +------------------+--------+--------+-------+ Proximal             90      30           +------------------+--------+--------+-------+ Mid                  83      34            +------------------+--------+--------+-------+ Distal               97      28           +------------------+--------+--------+-------+ +-----------------+--------+--------+-------+ Left Renal ArteryPSV cm/sEDV cm/sComment +-----------------+--------+--------+-------+ Origin              61      29           +-----------------+--------+--------+-------+ Proximal            47      15           +-----------------+--------+--------+-------+ Mid                 78      28           +-----------------+--------+--------+-------+ Distal              77      30           +-----------------+--------+--------+-------+ +------------+--------+--------+----+-----------+--------+--------+----+ Right KidneyPSV cm/sEDV cm/sRI  Left KidneyPSV cm/sEDV cm/sRI   +------------+--------+--------+----+-----------+--------+--------+----+ Upper Pole  17      6       0.66Upper Pole 39      9       0.78 +------------+--------+--------+----+-----------+--------+--------+----+ Mid         30      8       0.74Mid        26      5       0.80 +------------+--------+--------+----+-----------+--------+--------+----+ Lower Pole  27      8       0.71Lower Pole 17      4       0.75 +------------+--------+--------+----+-----------+--------+--------+----+ Hilar       34      10      0.72Hilar      60      22      0.63 +------------+--------+--------+----+-----------+--------+--------+----+ +------------------+-------+------------------+-------+ Right Kidney             Left Kidney               +------------------+-------+------------------+-------+ RAR (manual)      1.3    RAR (manual)      1.1     +------------------+-------+------------------+-------+ Cortex thickness  7.00 mmCorex thickness   7.40 mm +------------------+-------+------------------+-------+ Kidney length (cm)12.00  Kidney length (cm)12.00    +------------------+-------+------------------+-------+  Summary: Largest Aortic Diameter: 2.4 cm  Renal:  Right: Normal size right kidney. Abnormal right Resistive Index.        Normal cortical thickness of right kidney. No evidence of        right renal artery stenosis. RRV flow present. Left:  Normal size of left kidney. Abnormal left Resisitve Index.        Normal cortical thickness of the left kidney. No evidence of        left renal artery stenosis. LRV flow present. Mesenteric: Normal Celiac artery and Superior Mesenteric artery findings. Patent IVC.  *See table(s) above for measurements and observations.  Diagnosing physician: Kathlyn Sacramento MD  Electronically signed by Kathlyn Sacramento MD on 07/10/2021 at 92:16:32 PM.    Final     ASSESSMENT AND PLAN: This is a very pleasant 75 years old African-American male with history of rectal cancer diagnosed in 2007 status post concurrent chemoradiation followed by surgical resection.  At the same time the patient was diagnosed with chronic lymphocytic leukemia and has been in observation initially for several years but started on treatment with ibrutinib 420 mg in 2019 and has been tolerating this treatment well with no concerning adverse effects. He had repeat blood work today that is unremarkable except for mild anemia. I recommended for the patient to continue his current treatment with ibrutinib with the same dose. For the hypertension, he mentioned that his blood pressure is normal at home.  He was advised to monitor it closely and share the reading with his primary care physician for adjustment of his medication. I will see him back for follow-up visit in 3 months with repeat blood work. He was advised to call immediately if he has any other concerning symptoms in the interval. The patient voices understanding of current disease status and treatment options and is in agreement with the current care plan.  All questions were answered. The patient knows to  call the clinic with any problems, questions or concerns. We can certainly see the patient much sooner if necessary.  The total time spent in the appointment was 20 minutes.  Disclaimer: This note was dictated with voice recognition software. Similar sounding words can inadvertently be transcribed and may not be corrected upon review.

## 2021-07-18 ENCOUNTER — Telehealth: Payer: Self-pay

## 2021-07-18 NOTE — Telephone Encounter (Signed)
Aaron Gonzalez requesting the ICD10 code for pts dx for rx Imbruvica. She advised the callback number is [phone], opt: 3. She states the pts copay for this medication is $815.36 and they are assisting him with locating some grants.  I have call [employer] back and advised the ICD10 code is C91.10. I spoke with [place].

## 2021-07-18 NOTE — Progress Notes (Signed)
[facility] PREP Evaluation  Patient Details  Name: Aaron Gonzalez MRN: [id number] Date of Birth: February 13, 1946 Age: 75 y.o. PCP: Chaney Malling, PA  Vitals:   07/18/21 1030  BP: (!) 156/98  Pulse: 66  SpO2: 94%  Weight: 154 lb 6.4 oz (70 kg)     [facility] Eval - 07/18/21 1100       [facility] "PREP" Location   [facility] "PREP" Location [facility]      Referral    Referring Provider Oval Linsey    Reason for referral Inactivity;Hypertension;Stroke    Program Start Date 07/23/21   M/W 1pm-215pm x 12 wks     Measurement   Waist Circumference 35.5 inches    Hip Circumference 35.5 inches    Body fat 12.8 percent   Needs to build muscle and put weight on     Information for Trainer   Goals Improve mobility, improve flexibility   only had in home PT post stroke   Current Exercise [place] 3x wk    Orthopedic Concerns Left side weak, LBP    Pertinent Medical History Stroke, HTN, CLL,    Current Barriers none    Restrictions/Precautions Fall risk;Assistive device    Medications that affect exercise Medication causing dizziness/drowsiness      Timed Up and Go (TUGS)   Timed Up and Go Moderate risk 10-12 seconds      Mobility and Daily Activities   I find it easy to walk up or down two or more flights of stairs. 1    I have no trouble taking out the trash. 1    I do housework such as vacuuming and dusting on my own without difficulty. 1    I can easily lift a gallon of milk (8lbs). 4    I can easily walk a mile. 1    I have no trouble reaching into high cupboards or reaching down to pick up something from the floor. 1    I do not have trouble doing out-door work such as Armed forces logistics/support/administrative officer, raking leaves, or gardening. 1      Mobility and Daily Activities   I feel younger than my age. 3    I feel independent. 2    I feel energetic. 1    I live an active life.  1    I feel strong. 4    I feel healthy. 2    I feel active as other people my age. 2      How fit and strong are you.   Fit  and Strong Total Score 25            Past Medical History:  Diagnosis Date   CLL (chronic lymphocytic leukemia) ([facility])    DVT (deep venous thrombosis) ([facility])    History of rectal cancer    Hypertension    Stroke [facility])    Past Surgical History:  Procedure Laterality Date   COLECTOMY     with colostomy   Social History   Tobacco Use  Smoking Status Former   Packs/day: 0.50   Years: 10.00   Pack years: 5.00   Types: Cigarettes   Passive exposure: Never  Smokeless Tobacco Never    [name] 07/18/2021, 11:42 AM

## 2021-07-31 NOTE — Progress Notes (Signed)
[facility] PREP Weekly Session  Patient Details  Name: Aaron Gonzalez MRN: [id number] Date of Birth: 1946-08-25 Age: 75 y.o. PCP: Chaney Malling, PA  Vitals:   07/30/21 1330  Weight: 156 lb (70.8 kg)     [facility] Weekly seesion - 07/31/21 1100       [facility] "PREP" Location   [facility] "PREP" Location [facility]      Weekly Session   Topic Discussed Importance of resistance training;Other ways to be active    Minutes exercised this week 160 minutes    Classes attended to date 3            Class on 07/30/21 [name] 07/31/2021, 11:15 AM

## 2021-08-01 ENCOUNTER — Ambulatory Visit (HOSPITAL_BASED_OUTPATIENT_CLINIC_OR_DEPARTMENT_OTHER): Payer: Medicare Other | Admitting: Pharmacist Clinician (PhC)/ Clinical Pharmacy Specialist

## 2021-08-01 ENCOUNTER — Encounter (HOSPITAL_BASED_OUTPATIENT_CLINIC_OR_DEPARTMENT_OTHER): Payer: Self-pay | Admitting: Pharmacist Clinician (PhC)/ Clinical Pharmacy Specialist

## 2021-08-01 ENCOUNTER — Other Ambulatory Visit: Payer: Self-pay

## 2021-08-01 DIAGNOSIS — I10 Essential (primary) hypertension: Secondary | ICD-10-CM | POA: Insufficient documentation

## 2021-08-01 MED ORDER — HYDROCHLOROTHIAZIDE 25 MG PO TABS: 25.0000 mg | ORAL_TABLET | Freq: Every day | ORAL | 3 refills | Status: DC

## 2021-08-01 NOTE — Patient Instructions (Signed)
Call if you notice that your home BP readings continue to be elevated.  Ideally would like to avoid going > 150/90.   You can reach me at [phone] (Vicenta Olds/Chris)  Check your blood pressure at home daily (if able) and keep record of the readings.  Take your BP meds as follows:  Increase hydrochlorothiazide to 25 mg daily.    Continue with all other medications  Bring all of your meds, your BP cuff and your record of home blood pressures to your next appointment.  Exercise as you're able, try to walk approximately 30 minutes per day.  Keep salt intake to a minimum, especially watch canned and prepared boxed foods.  Eat more fresh fruits and vegetables and fewer canned items.  Avoid eating in fast food restaurants.    HOW TO TAKE YOUR BLOOD PRESSURE: Rest 5 minutes before taking your blood pressure.  Don't smoke or drink caffeinated beverages for at least 30 minutes before. Take your blood pressure before (not after) you eat. Sit comfortably with your back supported and both feet on the floor (don't cross your legs). Elevate your arm to heart level on a table or a desk. Use the proper sized cuff. It should fit smoothly and snugly around your bare upper arm. There should be enough room to slip a fingertip under the cuff. The bottom edge of the cuff should be 1 inch above the crease of the elbow. Ideally, take 3 measurements at one sitting and record the average.

## 2021-08-01 NOTE — Assessment & Plan Note (Signed)
Patient with essential hypertension overlapping with possible secondary hypertension due to chemotherapy medication.  Will increase his hydrochlorothiazide to 25 mg daily and continue with all other medications.  Advised that if he should stop his Imbruvica he should monitor home BP more closely to watch for BP drops.  Asked that he continue with regular monitoring and let us know in a month or so if his readings don't drop closer to goal.

## 2021-08-01 NOTE — Progress Notes (Signed)
08/01/2021 Aaron Gonzalez [birth year] [id number]   HPI:  Aaron Gonzalez is a 75 y.o. male patient of Dr Martinique, with a [place] below who presents today for hypertension clinic evaluation.  He was most recently seen by Coletta Memos NP in September for labile hypertension.  It was noted that patient has had difficult to control blood pressure for some time and has been on enalapril and amlodipine for some time.  At some point clonidine 0.1 mg prn was added.  He continues to use that, although has not needed any for the past 3-4 weeks  Today he returns for follow up.  He continues to have some residual left side weakness from his stroke back in March and is still working with PT.  He is left handed, so it has been somewhat difficult for him as he works to get strength back.  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  He is aware to monitor home BP closely should the medication be discontinued.    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     Blood Pressure Goal:  130/80  Current Medications: hydrochlorothiazide 12.5 mg qd, carvedilol 6.25 mg bid, amlodipine 10 mg qd, enalapril 20 mg bid, clonidine 0.1 mg prn systolic > 485  Family Hx: father with htn, died at 42; mother died at 14; 4 siblings all without heart disease; children w/o issues  Social Hx: no tobacco,  no alcohol, coffee in am 1 cup, no soda  Diet: home cooked mostly , aware that he is salt sensitive; vegetables fresh/frozen; mostly chicken fish, occasional beef, no pork  Exercise: physical therapy regularly d/t recent stroke  Home BP readings:   19 AM readings average 141/87 HR 69  16 PM readings average 138/87 HR 70  Intolerances: nkda  Labs:  9/22 - Na 140, K 4.3, Glu 62, BN 14, SCr 0.9, GFR > 60     Wt Readings from Last 3 Encounters:  08/01/21 156 lb (70.8 kg)   07/30/21 156 lb (70.8 kg)  07/18/21 154 lb 6.4 oz (70 kg)   BP Readings from Last 3 Encounters:  08/01/21 140/90  07/18/21 (!) 156/98  07/17/21 (!) 162/95   Pulse Readings from Last 3 Encounters:  08/01/21 64  07/18/21 66  07/17/21 70    Current Outpatient Medications  Medication Sig Dispense Refill   hydrochlorothiazide (HYDRODIURIL) 25 MG tablet Take 1 tablet (25 mg total) by mouth daily. 90 tablet 3   allopurinol (ZYLOPRIM) 300 MG tablet Take 300 mg by mouth daily.     amLODipine (NORVASC) 10 MG tablet Take 1 tablet (10 mg total) by mouth daily. 30 tablet 1   blood glucose meter kit and supplies KIT Dispense based on patient and insurance preference. Use up to four times daily as directed. 1 each 0   carvedilol (COREG) 6.25 MG tablet Take 1 tablet (6.25 mg total) by mouth 2 (two) times daily. 180 tablet 3   cloNIDine (CATAPRES) 0.1 MG tablet Take 0.1 mg by mouth daily as needed (If  systolic BP >462).     enalapril (VASOTEC) 20 MG tablet Take 20 mg by mouth 2 (two) times daily.     ibrutinib (IMBRUVICA) 420 MG TABS Take 420 mg by mouth daily. 30 tablet 3   pantoprazole (PROTONIX) 40 MG tablet Take 40 mg by mouth daily.  rosuvastatin (CRESTOR) 20 MG tablet Take 20 mg by mouth at bedtime.     valACYclovir (VALTREX) 500 MG tablet Take 500 mg by mouth every 12 (twelve) hours.     No current facility-administered medications for this visit.    No Known Allergies  Past Medical History:  Diagnosis Date   CLL (chronic lymphocytic leukemia) (HCC)    DVT (deep venous thrombosis) (HCC)    History of rectal cancer    Hypertension    Stroke (HCC)     Blood pressure 140/90, pulse 64, height 5' 5"  (1.651 m), weight 156 lb (70.8 kg).  Hypertension Patient with essential hypertension overlapping with possible secondary hypertension due to chemotherapy medication.  Will increase his hydrochlorothiazide to 25 mg daily and continue with all other medications.  Advised that if he  should stop his Imbruvica he should monitor home BP more closely to watch for BP drops.  Asked that he continue with regular monitoring and let us know in a month or so if his readings don't drop closer to goal.     Tommy Medal PharmD CPP [facility] [address] [place], [place] [address] [phone]

## 2021-08-08 NOTE — Progress Notes (Signed)
[facility] PREP Weekly Session  Patient Details  Name: Aaron Gonzalez MRN: [id number] Date of Birth: Nov 14, 1945 Age: 75 y.o. PCP: Chaney Malling, PA  Vitals:   08/06/21 1300  Weight: 156 lb (70.8 kg)     [facility] Weekly seesion - 08/08/21 0900       [facility] "PREP" Location   [facility] "PREP" Location [facility]      Weekly Session   Topic Discussed Healthy eating tips    Minutes exercised this week 150 minutes    Classes attended to date 4           Class held on 08/06/21  [name] 08/08/2021, 9:48 AM

## 2021-08-09 ENCOUNTER — Ambulatory Visit: Payer: Self-pay

## 2021-08-09 ENCOUNTER — Ambulatory Visit (INDEPENDENT_AMBULATORY_CARE_PROVIDER_SITE_OTHER): Payer: Medicare Other | Admitting: Orthopaedic Surgery

## 2021-08-09 ENCOUNTER — Encounter: Payer: Self-pay | Admitting: Orthopaedic Surgery

## 2021-08-09 ENCOUNTER — Other Ambulatory Visit: Payer: Self-pay

## 2021-08-09 DIAGNOSIS — M25552 Pain in left hip: Secondary | ICD-10-CM | POA: Diagnosis not present

## 2021-08-09 DIAGNOSIS — M160 Bilateral primary osteoarthritis of hip: Secondary | ICD-10-CM | POA: Insufficient documentation

## 2021-08-09 NOTE — Progress Notes (Signed)
Office Visit Note   Patient: Aaron Gonzalez           Date of Birth: 08-Sep-1946           MRN: [id number] Visit Date: 08/09/2021              Requested by: Aaron Gonzalez, [place], [place],  [place] [address] PCP: Aaron Gonzalez, [place]   Assessment & Plan: Visit Diagnoses:  1. Pain in left hip     Plan: Aaron Gonzalez relates a long history of bilateral hip pain.  He recently moved to the [place] area from [place].  He had a successful right total hip replacement somewhere around 4 to 5 years ago in [place].  He presently is using a walker and relates having more more trouble with his left hip.  X-rays demonstrate end-stage osteoarthritis with little if any joint space remaining in the left femoral acetabular articulation.  Certainly he is a good candidate for a hip replacement.  He is ready.  Will refer to Dr. Ninfa Gonzalez  Follow-Up Instructions: Return Will refer to Dr. Ninfa Gonzalez for hip replacement.   Orders:  Orders Placed This Encounter  Procedures   XR HIP UNILAT W OR W/O PELVIS 2-3 VIEWS LEFT   Ambulatory referral to Orthopedic Surgery   No orders of the defined types were placed in this encounter.     Procedures: No procedures performed   Clinical Data: No additional findings.   Subjective: Chief Complaint  Patient presents with   Left Hip - Pain  Patient presents today for left hip pain. He said that it has been hurting for two months. No known injury. His pain is located laterally and hurts upon getting up or with walking. No groin, buttock, or lower back pain. He takes tylenol and uses a walker. No previous left hip surgery.   HPI  Review of Systems   Objective: Vital Signs: There were no vitals taken for this visit.  Physical Exam Constitutional:      Appearance: He is well-developed.  Pulmonary:     Effort: Pulmonary effort is normal.  Skin:    General: Skin is warm and dry.  Neurological:     Mental Status: He is alert and  oriented to person, place, and time.  Psychiatric:        Behavior: Behavior normal.    Ortho Exam awake alert and oriented x3.  Comfortable sitting but considerable pain with weightbearing in reference of the left lower extremity.  Uses a walker.  Has little if any movement of his left hip from a neutral position.  There is no internal rotation and only about 10 degrees of external rotation with pain.  He can flex at least 90 degrees with good extend beyond neutral.  He is a little bit longer on the right than the left lower extremity by probably 3/8 of an inch.  Motor exam intact.  Straight leg raise negative.  There is no back pain.  Specialty Comments:  No specialty comments available.  Imaging: XR HIP UNILAT W OR W/O PELVIS 2-3 VIEWS LEFT  Result Date: 08/09/2021 AP pelvis and lateral of left hip demonstrate end-stage osteoarthritis of the left hip.  There is little if any joint space remaining throughout the femoral acetabular articulation.  There is subchondral cyst and sclerosis on both sides of the joint.  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  The head is somewhat deformed probably related to  the chronicity of the arthritic changes.  He has a successful right total hip replacement    PMFS History: Patient Active Problem List   Diagnosis Date Noted   Bilateral primary osteoarthritis of hip 08/09/2021   Hypertension 08/01/2021   CLL (chronic lymphocytic leukemia) ([place]) 04/16/2021   History of stroke 04/16/2021   History of rectal cancer 04/16/2021   Encounter for antineoplastic chemotherapy 16/07/9603   Acute metabolic encephalopathy 54/06/8118   Past Medical History:  Diagnosis Date   CLL (chronic lymphocytic leukemia) ([place])    DVT (deep venous thrombosis) (HCC)    History of rectal cancer    Hypertension    Stroke ([place])     Family History  Problem Relation Age of Onset   Hypertension Mother    Heart failure Mother    Hypertension Father    Stroke Father     Breast cancer Sister    Stroke Brother     Past Surgical History:  Procedure Laterality Date   COLECTOMY     with colostomy   Social History   Occupational History   Not on file  Tobacco Use   Smoking status: Former    Packs/day: 0.50    Years: 10.00    Pack years: 5.00    Types: Cigarettes    Passive exposure: Never   Smokeless tobacco: Never  Vaping Use   Vaping Use: Never used  Substance and Sexual Activity   Alcohol use: Not Currently   Drug use: Not Currently   Sexual activity: Not on file

## 2021-08-14 NOTE — Progress Notes (Signed)
[facility] PREP Weekly Session  Patient Details  Name: Aaron Gonzalez MRN: [id number] Date of Birth: 03/22/1946 Age: 75 y.o. PCP: Chaney Malling, PA  Vitals:   08/13/21 1330  Weight: 156 lb (70.8 kg)     [facility] Weekly seesion - 08/14/21 1600       [facility] "PREP" Location   [facility] "PREP" Location [facility]      Weekly Session   Topic Discussed Health habits    Minutes exercised this week 260 minutes    Classes attended to date 6           Class held on 08/13/21  [name] 08/14/2021, 4:51 PM

## 2021-08-16 ENCOUNTER — Other Ambulatory Visit: Payer: Self-pay

## 2021-08-16 ENCOUNTER — Ambulatory Visit (INDEPENDENT_AMBULATORY_CARE_PROVIDER_SITE_OTHER): Payer: Medicare Other | Admitting: Orthopaedic Surgery

## 2021-08-16 DIAGNOSIS — M1612 Unilateral primary osteoarthritis, left hip: Secondary | ICD-10-CM

## 2021-08-16 NOTE — Progress Notes (Signed)
Office Visit Note   Patient: Aaron Gonzalez           Date of Birth: [birth year]           MRN: [id number] Visit Date: 08/16/2021              Requested by: Chaney Malling, [place], [place],  [place] [address] PCP: Chaney Malling, [place]   Assessment & Plan: Visit Diagnoses:  1. Unilateral primary osteoarthritis, left hip     Plan: I spoke to him in length in detail about the recommendation for hip replacement surgery and he does wish to proceed with this given the severity of his arthritis with the left hip and the failure conservative treatment combined with his daily pain.  I gave him a handout about anterior hip replacement surgery and described in detail what the surgery involves.  We talked about the risks and benefits of surgery and described the interoperative postoperative course.  All question concerns were answered and addressed.  We will work on getting this scheduled but likely may not be topped with first the year.  Follow-Up Instructions: Return for 2 weeks post-op.   Orders:  No orders of the defined types were placed in this encounter.  No orders of the defined types were placed in this encounter.     Procedures: No procedures performed   Clinical Data: No additional findings.   Subjective: Chief Complaint  Patient presents with   Left Hip - Pain  The patient is a pleasant 75 year old gentleman sent from my partner Dr. Durward Fortes to perform a left hip replacement on this patient.  He does have severe end-stage arthritis that is well-documented of his left hip.  He has had a previous right hip replacement done a few years ago in [place].  He is from [place] but is moved back to [place] to be with family as he is recovering from a stroke that happened earlier this year.  He says he had left-sided weakness and that is improving quite a bit.  He does ambulate with a rolling walker and he is no longer on any type of blood thinning  medications.  He is a thin individual with a BMI of only 26.  He is on blood pressure medications.  He has had no other acute change in medical status.  He is not a diabetic. he reports severe daily left hip and groin pain that is detriment affecting his mobility, his quality of life and his actives of daily living.  His right total hip is done very well for him.  He has been to physical therapy to recover from his stroke and to work on his mobility as a relates to his hip arthritis on the left side.  HPI  Review of Systems He currently denies any headache, chest pain, shortness of breath, fever, chills, nausea, vomiting  Objective: Vital Signs: There were no vitals taken for this visit.  Physical Exam He is alert and orient x3 and in no acute distress. Ortho Exam Examination of his left hip shows significant limitations with internal and external rotation and significant stiffness as well as pain in the groin.  His right operative hip moves smoothly and fluidly. Specialty Comments:  No specialty comments available.  Imaging: No results found. X-rays independently reviewed of the left hip and pelvis on the canopy system shows severe end-stage arthritis of the left hip.  The joint space is completely gone.  There are sclerotic changes in the acetabular and femoral head as well as periarticular osteophytes.  He does appear to have a well-seated right total hip arthroplasty.  PMFS History: Patient Active Problem List   Diagnosis Date Noted   Unilateral primary osteoarthritis, left hip 08/16/2021   Bilateral primary osteoarthritis of hip 08/09/2021   Hypertension 08/01/2021   CLL (chronic lymphocytic leukemia) ([place]) 04/16/2021   History of stroke 04/16/2021   History of rectal cancer 04/16/2021   Encounter for antineoplastic chemotherapy 26/83/4196   Acute metabolic encephalopathy 22/29/7989   Past Medical History:  Diagnosis Date   CLL (chronic lymphocytic leukemia) ([place])    DVT  (deep venous thrombosis) (HCC)    History of rectal cancer    Hypertension    Stroke ([place])     Family History  Problem Relation Age of Onset   Hypertension Mother    Heart failure Mother    Hypertension Father    Stroke Father    Breast cancer Sister    Stroke Brother     Past Surgical History:  Procedure Laterality Date   COLECTOMY     with colostomy   Social History   Occupational History   Not on file  Tobacco Use   Smoking status: Former    Packs/day: 0.50    Years: 10.00    Pack years: 5.00    Types: Cigarettes    Passive exposure: Never   Smokeless tobacco: Never  Vaping Use   Vaping Use: Never used  Substance and Sexual Activity   Alcohol use: Not Currently   Drug use: Not Currently   Sexual activity: Not on file

## 2021-08-21 ENCOUNTER — Other Ambulatory Visit (HOSPITAL_COMMUNITY): Payer: Self-pay

## 2021-08-21 NOTE — Progress Notes (Signed)
[facility] PREP Weekly Session  Patient Details  Name: GENE GLAZEBROOK MRN: [id number] Date of Birth: 27-Sep-1946 Age: 75 y.o. PCP: Chaney Malling, PA  Vitals:   08/20/21 1300  Weight: 155 lb (70.3 kg)     [facility] Weekly seesion - 08/21/21 1700       [facility] "PREP" Location   [facility] "PREP" [facility]      Weekly Session   Topic Discussed Restaurant Eating   salt demo   Minutes exercised this week 225 minutes    Classes attended to date 8           Class held on 08/20/21  [name] 08/21/2021, 5:21 PM

## 2021-08-24 ENCOUNTER — Telehealth: Payer: Self-pay | Admitting: Medical Oncology

## 2021-08-24 NOTE — Telephone Encounter (Signed)
Pt requesting pt assistance for Imbruvica. He had pt assistance for this when he was in [place].  [employer] rx cost he said is $860/month. He has 1 month supply left.

## 2021-08-28 NOTE — Progress Notes (Signed)
[facility] PREP Weekly Session  Patient Details  Name: Aaron Gonzalez MRN: [id number] Date of Birth: 1946-08-26 Age: 75 y.o. PCP: Chaney Malling, PA  Vitals:   08/27/21 1300  Weight: 155 lb (70.3 kg)     [facility] Weekly seesion - 08/28/21 1700       [facility] "PREP" Location   [facility] "PREP" Location [facility]      Weekly Session   Topic Discussed Stress management and problem solving    Minutes exercised this week 220 minutes    Classes attended to date 10            Class held on 08/27/21  [name] 08/28/2021, 5:20 PM

## 2021-09-03 ENCOUNTER — Telehealth: Payer: Self-pay | Admitting: Licensed Clinical Social Worker

## 2021-09-03 NOTE — Telephone Encounter (Signed)
Request made by Winifred Olive, w/ PREP program for rides to be scheduled for pt to get to program today on Heart/Vascular call center. Pt sees Dr. Oval Linsey. I was able to schedule a ride today and Wednesday for pt w/ Darrick Meigs, Environmental manager.  I have mailed pt a list of alternate transportation options and [facility] Medicare transportation number to utilize if needed as additional non Heart/Vascular appointments.   Westley Hummer, MSW, [place]  [phone]

## 2021-09-04 NOTE — Progress Notes (Signed)
[facility] PREP Weekly Session  Patient Details  Name: Aaron Gonzalez MRN: [id number] Date of Birth: 05-Dec-1945 Age: 75 y.o. PCP: Chaney Malling, PA  There were no vitals filed for this visit.   [facility] Weekly seesion - 09/04/21 1700       [facility] "PREP" Location   [facility]PREP" [facility]      Weekly Session   Topic Discussed Other   non scale victories   Minutes exercised this week 270 minutes    Classes attended to date 11           Class held on 09/03/21  [name] 09/04/2021, 5:14 PM

## 2021-09-10 ENCOUNTER — Telehealth: Payer: Self-pay | Admitting: Licensed Clinical Social Worker

## 2021-09-10 NOTE — Telephone Encounter (Signed)
LCSW received a message from [place], w. PREP program at [facility]. Pt car still not working, requesting assistance with [employer] ride. LCSW able to get approval that we can utilize [employer] again since pt is not [facility] affiliated. LCSW scheduled ride to [facility] and ride home. I encouraged Pam to show pt the number on the back of his [facility] Medicare to schedule ride through that service as well moving forward if car remains out of service/his sister cannot take him.   Westley Hummer, MSW, [place]  [phone]- work cell phone (preferred) [phone]- desk phone

## 2021-09-11 NOTE — Progress Notes (Signed)
[facility] PREP Weekly Session  Patient Details  Name: Aaron Gonzalez MRN: [id number] Date of Birth: May 08, 1946 Age: 75 y.o. PCP: Chaney Malling, PA  Vitals:   09/10/21 1330  Weight: 156 lb (70.8 kg)     [facility] Weekly seesion - 09/11/21 1600       [facility] "PREP" Location   [facility] "PREP" Location [facility]      Weekly Session   Topic Discussed Other   Portions   Minutes exercised this week 180 minutes    Classes attended to date 13           Class held on 09/10/21  [name] 09/11/2021, 4:14 PM

## 2021-09-12 ENCOUNTER — Telehealth: Payer: Self-pay | Admitting: Licensed Clinical Social Worker

## 2021-09-12 NOTE — Telephone Encounter (Signed)
LCSW received additional request for transportation to [place] program today from [facility]. Pt has told Pam he has not received resources sent on 11/14; there may be some delay in mail from office. I provided Pam with the [facility] Medicare transportation number from pt card if she can notate that for him. Remain available as needed.   Westley Hummer, MSW, [place]  [phone]- work cell phone (preferred) [phone]- desk phone

## 2021-09-17 ENCOUNTER — Telehealth: Payer: Self-pay | Admitting: Licensed Clinical Social Worker

## 2021-09-17 NOTE — Telephone Encounter (Signed)
Additional ride scheduled for pt to get to PREP program via [facility] ([place]) today per request of Winifred Olive.   Westley Hummer, MSW, [place]  [phone]- work cell phone (preferred) [phone]- desk phone

## 2021-09-18 NOTE — Progress Notes (Signed)
[facility] PREP Weekly Session  Patient Details  Name: Aaron Gonzalez MRN: [id number] Date of Birth: April 24, 1946 Age: 75 y.o. PCP: Chaney Malling, PA  Vitals:   09/17/21 1300  Weight: 157 lb (71.2 kg)     [facility] Weekly seesion - 09/18/21 1600       [facility] "PREP" Location   [facility] "PREP" Location [facility]      Weekly Session   Topic Discussed Finding support    Minutes exercised this week 135 minutes    Classes attended to date 15            Class held on 09/17/21 late entry [name] 09/18/2021, 4:47 PM

## 2021-10-01 NOTE — Progress Notes (Signed)
[facility] PREP Weekly Session  Patient Details  Name: Aaron Gonzalez MRN: [id number] Date of Birth: 11-Dec-1945 Age: 75 y.o. PCP: Chaney Malling, PA  Vitals:   10/01/21 1600  Weight: 156 lb (70.8 kg)     [facility] Weekly seesion - 10/01/21 1600       [facility] "PREP" Location   [facility] "PREP" Location [facility]      Weekly Session   Topic Discussed Hitting roadblocks    Minutes exercised this week 250 minutes    Classes attended to date 17             [name] 10/01/2021, 4:02 PM

## 2021-10-03 NOTE — Progress Notes (Signed)
Sent message, via epic in basket, requesting orders in epic from surgeon.  

## 2021-10-04 ENCOUNTER — Other Ambulatory Visit: Payer: Self-pay | Admitting: Physician Assistant

## 2021-10-04 DIAGNOSIS — M1612 Unilateral primary osteoarthritis, left hip: Secondary | ICD-10-CM

## 2021-10-11 ENCOUNTER — Encounter (HOSPITAL_COMMUNITY): Payer: Self-pay

## 2021-10-11 NOTE — Progress Notes (Signed)
[facility] PREP Evaluation  Patient Details  Name: Aaron Gonzalez MRN: [id number] Date of Birth: 09-27-1946 Age: 75 y.o. PCP: Chaney Malling, PA  Vitals:   10/10/21 1330  BP: 126/80  Pulse: 66  SpO2: 94%  Weight: 154 lb 12.8 oz (70.2 kg)     [facility] Eval - 10/11/21 1600       [facility] "PREP" Location   [facility] "PREP" Location [facility]      Referral    Program Start Date 10/10/21   PREP end date     Measurement   Waist Circumference 36 inches    Hip Circumference 37.5 inches    Body fat 28 percent      Mobility and Daily Activities   I find it easy to walk up or down two or more flights of stairs. 3    I have no trouble taking out the trash. 1    I do housework such as vacuuming and dusting on my own without difficulty. 2    I can easily lift a gallon of milk (8lbs). 4    I can easily walk a mile. 1    I have no trouble reaching into high cupboards or reaching down to pick up something from the floor. 2    I do not have trouble doing out-door work such as Armed forces logistics/support/administrative officer, raking leaves, or gardening. 3      Mobility and Daily Activities   I feel younger than my age. 3    I feel independent. 2    I feel energetic. 2    I live an active life.  1    I feel strong. 3    I feel healthy. 3    I feel active as other people my age. 2      How fit and strong are you.   Fit and Strong Total Score 32            Past Medical History:  Diagnosis Date   CLL (chronic lymphocytic leukemia) (HCC)    DVT (deep venous thrombosis) (HCC)    History of rectal cancer    Hypertension    Stroke [facility])    Past Surgical History:  Procedure Laterality Date   COLECTOMY     with colostomy   Social History   Tobacco Use  Smoking Status Former   Packs/day: 0.50   Years: 10.00   Pack years: 5.00   Types: Cigarettes   Passive exposure: Never  Smokeless Tobacco Never   Fit test: Cardio march: 197 to 214 Sit to stand: 5 to 6 Bicep curl: 12 to 12 Able to balance on own without  walker for testing Hip surgery is planned for Jan 6th Plans on coming back to exercise after therapy post op [name] 10/11/2021, 4:50 PM

## 2021-10-11 NOTE — Progress Notes (Addendum)
Anesthesia Review:  PCP: DR Manuella Ghazi  Cardiologist : [place]- 07/04/21- Denyse Amass Cleaver,NP LOV - D rPEter Martinique- 06/19/21.  Chest x-ray : EKG : 06/14/21  Echo :07/12/21  Stress test: Cardiac Cath :  Activity level: can do a flight of stairs  Sleep Study/ CPAP : none  Fasting Blood Sugar :      / Checks Blood Sugar -- times a day:   Blood Thinner/ Instructions /Last Dose: ASA / Instructions/ Last Dose :   Covid test on 10/24/2021 at 0900am  BMP done 10/16/21- routed to [place],

## 2021-10-11 NOTE — Progress Notes (Signed)
You will need to have a covid test done prior to surgery.  Date 10/23/2021.  Come thru main Entrance at [facility] Have a seat in the lobby to the right as you come thru the door.  Call [phone] and let them know you your name and that you are here for covid testing.    Your procedure is scheduled on:       10/26/2021.    Report to [facility] Main  Entrance   Report to admitting at   [phone]     Call this number if you have problems the morning of surgery [phone]    REMEMBER: NO  SOLID FOOD CANDY OR GUM AFTER MIDNIGHT. CLEAR LIQUIDS UNTIL    0415am        . NOTHING BY MOUTH EXCEPT CLEAR LIQUIDS UNTIL   0415am  . PLEASE FINISH ENSURE DRINK PER SURGEON ORDER  WHICH NEEDS TO BE COMPLETED AT   0415am    .      CLEAR LIQUID DIET   Foods Allowed                                                                    Coffee and tea, regular and decaf                            Fruit ices (not with fruit pulp)                                      Iced Popsicles                                    Carbonated beverages, regular and diet                                    Cranberry, grape and apple juices Sports drinks like Gatorade Lightly seasoned clear broth or consume(fat free) Sugar, honey syrup ___________________________________________________________________      BRUSH YOUR TEETH MORNING OF SURGERY AND RINSE YOUR MOUTH OUT, NO CHEWING GUM CANDY OR MINTS.     Take these medicines the morning of surgery with A SIP OF WATER:  allopurinol, amlodipine, coreg, clonidine if needed, protonix   DO NOT TAKE ANY DIABETIC MEDICATIONS DAY OF YOUR SURGERY                               You may not have any metal on your body including hair pins and              piercings  Do not wear jewelry, make-up, lotions, powders or perfumes, deodorant             Do not wear nail polish on your fingernails.  Do not shave  48 hours prior to surgery.              Men may shave face and  neck.   Do  not bring valuables to the hospital. [place].  Contacts, dentures or bridgework may not be worn into surgery.  Leave suitcase in the car. After surgery it may be brought to your room.     Patients discharged the day of surgery will not be allowed to drive home. IF YOU ARE HAVING SURGERY AND GOING HOME THE SAME DAY, YOU MUST HAVE AN ADULT TO DRIVE YOU HOME AND BE WITH YOU FOR 24 HOURS. YOU MAY GO HOME BY TAXI OR UBER OR ORTHERWISE, BUT AN ADULT MUST ACCOMPANY YOU HOME AND STAY WITH YOU FOR 24 HOURS.  Name and phone number of your driver:  Special Instructions: N/A              Please read over the following fact sheets you were given: _____________________________________________________________________  [facility] - Preparing for Surgery Before surgery, you can play an important role.  Because skin is not sterile, your skin needs to be as free of germs as possible.  You can reduce the number of germs on your skin by washing with CHG (chlorahexidine gluconate) soap before surgery.  CHG is an antiseptic cleaner which kills germs and bonds with the skin to continue killing germs even after washing. Please DO NOT use if you have an allergy to CHG or antibacterial soaps.  If your skin becomes reddened/irritated stop using the CHG and inform your nurse when you arrive at Short Stay. Do not shave (including legs and underarms) for at least 48 hours prior to the first CHG shower.  You may shave your face/neck. Please follow these instructions carefully:  1.  Shower with CHG Soap the night before surgery and the  morning of Surgery.  2.  If you choose to wash your hair, wash your hair first as usual with your  normal  shampoo.  3.  After you shampoo, rinse your hair and body thoroughly to remove the  shampoo.                           4.  Use CHG as you would any other liquid soap.  You can apply chg directly  to the skin and wash                        Gently with a scrungie or clean washcloth.  5.  Apply the CHG Soap to your body ONLY FROM THE NECK DOWN.   Do not use on face/ open                           Wound or open sores. Avoid contact with eyes, ears mouth and genitals (private parts).                       Wash face,  Genitals (private parts) with your normal soap.             6.  Wash thoroughly, paying special attention to the area where your surgery  will be performed.  7.  Thoroughly rinse your body with warm water from the neck down.  8.  DO NOT shower/wash with your normal soap after using and rinsing off  the CHG Soap.  9.  Pat yourself dry with a clean towel.            10.  Wear clean pajamas.            11.  Place clean sheets on your bed the night of your first shower and do not  sleep with pets. Day of Surgery : Do not apply any lotions/deodorants the morning of surgery.  Please wear clean clothes to the hospital/surgery center.  FAILURE TO FOLLOW THESE INSTRUCTIONS MAY RESULT IN THE CANCELLATION OF YOUR SURGERY PATIENT SIGNATURE_________________________________  NURSE SIGNATURE__________________________________  ________________________________________________________________________

## 2021-10-16 ENCOUNTER — Encounter (HOSPITAL_COMMUNITY)
Admission: RE | Admit: 2021-10-16 | Discharge: 2021-10-16 | Disposition: A | Discharge status: Home / Self Care | Payer: Medicare Other | Source: Ambulatory Visit | Attending: Orthopaedic Surgery | Admitting: Orthopaedic Surgery

## 2021-10-16 ENCOUNTER — Other Ambulatory Visit: Payer: Self-pay

## 2021-10-16 ENCOUNTER — Encounter (HOSPITAL_COMMUNITY): Payer: Self-pay

## 2021-10-16 VITALS — BP 135/81 | HR 69 | Temp 98.7°F | Resp 16 | Ht 65.0 in | Wt 152.0 lb

## 2021-10-16 DIAGNOSIS — C9110 Chronic lymphocytic leukemia of B-cell type not having achieved remission: Secondary | ICD-10-CM | POA: Diagnosis not present

## 2021-10-16 DIAGNOSIS — Z8673 Personal history of transient ischemic attack (TIA), and cerebral infarction without residual deficits: Secondary | ICD-10-CM | POA: Insufficient documentation

## 2021-10-16 DIAGNOSIS — M1612 Unilateral primary osteoarthritis, left hip: Secondary | ICD-10-CM | POA: Insufficient documentation

## 2021-10-16 DIAGNOSIS — Z87891 Personal history of nicotine dependence: Secondary | ICD-10-CM | POA: Insufficient documentation

## 2021-10-16 DIAGNOSIS — I10 Essential (primary) hypertension: Secondary | ICD-10-CM | POA: Insufficient documentation

## 2021-10-16 DIAGNOSIS — Z01812 Encounter for preprocedural laboratory examination: Secondary | ICD-10-CM | POA: Insufficient documentation

## 2021-10-16 DIAGNOSIS — Z01818 Encounter for other preprocedural examination: Secondary | ICD-10-CM

## 2021-10-16 DIAGNOSIS — Z86718 Personal history of other venous thrombosis and embolism: Secondary | ICD-10-CM | POA: Diagnosis not present

## 2021-10-16 HISTORY — DX: Unspecified osteoarthritis, unspecified site: M19.90

## 2021-10-16 LAB — CBC
HCT: 40.8 % (ref 39.0–52.0)
Hemoglobin: 13.2 g/dL (ref 13.0–17.0)
MCH: 31.5 pg (ref 26.0–34.0)
MCHC: 32.4 g/dL (ref 30.0–36.0)
MCV: 97.4 fL (ref 80.0–100.0)
Platelets: 276 10*3/uL (ref 150–400)
RBC: 4.19 MIL/uL — ABNORMAL LOW (ref 4.22–5.81)
RDW: 15.9 % — ABNORMAL HIGH (ref 11.5–15.5)
WBC: 10.2 10*3/uL (ref 4.0–10.5)
nRBC: 0 % (ref 0.0–0.2)

## 2021-10-16 LAB — BASIC METABOLIC PANEL
Anion gap: 7 (ref 5–15)
BUN: 16 mg/dL (ref 8–23)
CO2: 27 mmol/L (ref 22–32)
Calcium: 8.8 mg/dL — ABNORMAL LOW (ref 8.9–10.3)
Chloride: 105 mmol/L (ref 98–111)
Creatinine, Ser: 0.96 mg/dL (ref 0.61–1.24)
GFR, Estimated: >60 mL/min (ref >60)
Glucose, Bld: 88 mg/dL (ref 70–99)
Potassium: 3.2 mmol/L — ABNORMAL LOW (ref 3.5–5.1)
Sodium: 139 mmol/L (ref 135–145)

## 2021-10-16 LAB — SURGICAL PCR SCREEN
MRSA, PCR: NEGATIVE
Staphylococcus aureus: NEGATIVE

## 2021-10-19 NOTE — Progress Notes (Signed)
Anesthesia Chart Review   Case: [id number] Date/Time: 10/26/21 0700   Procedure: Left TOTAL HIP ARTHROPLASTY ANTERIOR APPROACH (Left: Hip) - RNFA   Anesthesia type: Spinal   Pre-op diagnosis: Osteoarthritis, Degenerative Joint Diease Left Hip   Location: [facility] ROOM 09 / [facility]   Surgeons: Mcarthur Rossetti, MD       DISCUSSION:75 y.o. former smoker with h/o HTN, stroke 2018, DVT, CLL, left hip OA scheduled for above procedure 10/26/20 with Dr. Jean Rosenthal.   Followed by cardiology for labile HTN. Last seen 07/04/21 with better control.  BP 135/81 at PAT visit.   Echo 07/12/21 with normal LV function, moderate MR.   Anticipate pt can proceed with planned procedure barring acute status change.   VS: BP 135/81    Pulse 69    Temp 37.1 C (Oral)    Resp 16    Ht 5' 5"  (1.651 m)    Wt 68.9 kg    SpO2 99%    BMI 25.29 kg/m   PROVIDERS: [name], MD is PCP   Martin[place], Peter, MD is Cardiologist  LABS: Labs reviewed: Acceptable for surgery. (all labs ordered are listed, but only abnormal results are displayed)  Labs Reviewed  CBC - Abnormal; Notable for the following components:      Result Value   RBC 4.19 (*)    RDW 15.9 (*)    All other components within normal limits  BASIC METABOLIC PANEL - Abnormal; Notable for the following components:   Potassium 3.2 (*)    Calcium 8.8 (*)    All other components within normal limits  SURGICAL PCR SCREEN  TYPE AND SCREEN     IMAGES:   EKG: 06/14/21 68 bpm  Sinus rhythm Ventricular premature complex Left axis deviation Probable anteroseptal infarct, old  CV: Echo 07/12/21  1. Left ventricular ejection fraction, by estimation, is 65 to 70%. The  left ventricle has normal function. The left ventricle has no regional  wall motion abnormalities. Left ventricular diastolic parameters are  consistent with Grade I diastolic  dysfunction (impaired relaxation).   2. Right ventricular systolic function is normal. The right  ventricular  size is normal. There is mildly elevated pulmonary artery systolic  pressure.   3. Left atrial size was moderately dilated.   4. Right atrial size was moderately dilated.   5. The mitral valve is grossly normal. Mild to moderate mitral valve  regurgitation. No evidence of mitral stenosis.   6. The aortic valve is tricuspid. There is mild calcification of the  aortic valve. Aortic valve regurgitation is not visualized. No aortic  stenosis is present.   7. There is borderline dilatation of the ascending aorta, measuring 39  mm.   8. The inferior vena cava is normal in size with greater than 50%  respiratory variability, suggesting right atrial pressure of 3 mmHg. Past Medical History:  Diagnosis Date   Arthritis    CLL (chronic lymphocytic leukemia) ([place])    DVT (deep venous thrombosis) (HCC)    History of rectal cancer    Hypertension    Stroke [facility])     Past Surgical History:  Procedure Laterality Date   COLECTOMY     with colostomy    MEDICATIONS:  allopurinol (ZYLOPRIM) 300 MG tablet   amLODipine (NORVASC) 10 MG tablet   blood glucose meter kit and supplies KIT   carvedilol (COREG) 6.25 MG tablet   cloNIDine (CATAPRES) 0.1 MG tablet   enalapril (VASOTEC) 20 MG tablet  hydrochlorothiazide (HYDRODIURIL) 25 MG tablet   ibrutinib (IMBRUVICA) 420 MG TABS   pantoprazole (PROTONIX) 40 MG tablet   rosuvastatin (CRESTOR) 20 MG tablet   No current facility-administered medications for this encounter.     Konrad Felix Ward, PA-C [facility] Pre-Surgical Testing [phone]

## 2021-10-24 ENCOUNTER — Other Ambulatory Visit: Payer: Self-pay

## 2021-10-24 ENCOUNTER — Encounter (HOSPITAL_COMMUNITY)
Admission: RE | Admit: 2021-10-24 | Discharge: 2021-10-24 | Disposition: A | Discharge status: Home / Self Care | Payer: Medicare Other | Source: Ambulatory Visit | Attending: Orthopaedic Surgery | Admitting: Orthopaedic Surgery

## 2021-10-24 DIAGNOSIS — Z20822 Contact with and (suspected) exposure to covid-19: Secondary | ICD-10-CM | POA: Insufficient documentation

## 2021-10-24 DIAGNOSIS — Z01812 Encounter for preprocedural laboratory examination: Secondary | ICD-10-CM | POA: Insufficient documentation

## 2021-10-24 DIAGNOSIS — Z01818 Encounter for other preprocedural examination: Secondary | ICD-10-CM

## 2021-10-24 LAB — SARS CORONAVIRUS 2 (TAT 6-24 HRS): SARS Coronavirus 2: NEGATIVE

## 2021-10-25 NOTE — H&P (Signed)
TOTAL HIP ADMISSION H&P ° °Patient is admitted for left total hip arthroplasty. ° °Subjective: ° °Chief Complaint: left hip pain ° °HPI: Aaron Gonzalez, 76 y.o. male, has a history of pain and functional disability in the left hip(s) due to arthritis and patient has failed non-surgical conservative treatments for greater than 12 weeks to include NSAID's and/or analgesics, flexibility and strengthening excercises, supervised PT with diminished ADL's post treatment, use of assistive devices, and activity modification.  Onset of symptoms was gradual starting 3 years ago with gradually worsening course since that time.The patient noted no past surgery on the left hip(s).  Patient currently rates pain in the left hip at 10 out of 10 with activity. Patient has night pain, worsening of pain with activity and weight bearing, trendelenberg gait, pain that interfers with activities of daily living, and pain with passive range of motion. Patient has evidence of subchondral cysts, subchondral sclerosis, periarticular osteophytes, and joint space narrowing by imaging studies. This condition presents safety issues increasing the risk of falls.  There is no current active infection. ° °Patient Active Problem List  ° Diagnosis Date Noted  ° Unilateral primary osteoarthritis, left hip 08/16/2021  ° Bilateral primary osteoarthritis of hip 08/09/2021  ° Hypertension 08/01/2021  ° CLL (chronic lymphocytic leukemia) (HCC) 04/16/2021  ° History of stroke 04/16/2021  ° History of rectal cancer 04/16/2021  ° Encounter for antineoplastic chemotherapy 04/16/2021  ° Acute metabolic encephalopathy 03/31/2021  ° °Past Medical History:  °Diagnosis Date  ° Arthritis   ° CLL (chronic lymphocytic leukemia) (HCC)   ° DVT (deep venous thrombosis) (HCC)   ° History of rectal cancer   ° Hypertension   ° Stroke (HCC)   °  °Past Surgical History:  °Procedure Laterality Date  ° COLECTOMY    ° with colostomy  °  °No current facility-administered  medications for this encounter.  ° °Current Outpatient Medications  °Medication Sig Dispense Refill Last Dose  ° allopurinol (ZYLOPRIM) 300 MG tablet Take 300 mg by mouth daily.     ° amLODipine (NORVASC) 10 MG tablet Take 1 tablet (10 mg total) by mouth daily. 30 tablet 1   ° carvedilol (COREG) 6.25 MG tablet Take 1 tablet (6.25 mg total) by mouth 2 (two) times daily. 180 tablet 3   ° cloNIDine (CATAPRES) 0.1 MG tablet Take 0.1 mg by mouth daily as needed (If  systolic BP >160).     ° enalapril (VASOTEC) 20 MG tablet Take 20 mg by mouth 2 (two) times daily.     ° hydrochlorothiazide (HYDRODIURIL) 25 MG tablet Take 1 tablet (25 mg total) by mouth daily. 90 tablet 3   ° ibrutinib (IMBRUVICA) 420 MG TABS Take 420 mg by mouth daily. 30 tablet 3   ° pantoprazole (PROTONIX) 40 MG tablet Take 40 mg by mouth daily.     ° rosuvastatin (CRESTOR) 20 MG tablet Take 20 mg by mouth at bedtime.     ° blood glucose meter kit and supplies KIT Dispense based on patient and insurance preference. Use up to four times daily as directed. 1 each 0   ° °No Known Allergies  °Social History  ° °Tobacco Use  ° Smoking status: Former  °  Packs/day: 0.50  °  Years: 10.00  °  Pack years: 5.00  °  Types: Cigarettes  °  Passive exposure: Never  ° Smokeless tobacco: Never  °Substance Use Topics  ° Alcohol use: Never  °  °Family History  °Problem Relation Age   of Onset  ° Hypertension Mother   ° Heart failure Mother   ° Hypertension Father   ° Stroke Father   ° Breast cancer Sister   ° Stroke Brother   °  ° °Review of Systems  °Musculoskeletal:  Positive for gait problem.  °All other systems reviewed and are negative. ° °Objective: ° °Physical Exam °Vitals reviewed.  °Constitutional:   °   Appearance: Normal appearance.  °HENT:  °   Head: Normocephalic and atraumatic.  °Cardiovascular:  °   Rate and Rhythm: Normal rate.  °Pulmonary:  °   Effort: Pulmonary effort is normal.  °Abdominal:  °   Palpations: Abdomen is soft.  °Musculoskeletal:  °    Cervical back: Normal range of motion and neck supple.  °   Left hip: Tenderness and bony tenderness present. Decreased range of motion. Decreased strength.  °Neurological:  °   Mental Status: He is alert and oriented to person, place, and time.  °Psychiatric:     °   Behavior: Behavior normal.  ° ° °Vital signs in last 24 hours: °  ° °Labs: ° ° °Estimated body mass index is 25.29 kg/m² as calculated from the following: °  Height as of 10/16/21: 5' 5" (1.651 m). °  Weight as of 10/16/21: 68.9 kg. ° ° °Imaging Review °Plain radiographs demonstrate severe degenerative joint disease of the left hip(s). The bone quality appears to be good for age and reported activity level. ° ° ° ° ° °Assessment/Plan: ° °End stage arthritis, left hip(s) ° °The patient history, physical examination, clinical judgement of the provider and imaging studies are consistent with end stage degenerative joint disease of the left hip(s) and total hip arthroplasty is deemed medically necessary. The treatment options including medical management, injection therapy, arthroscopy and arthroplasty were discussed at length. The risks and benefits of total hip arthroplasty were presented and reviewed. The risks due to aseptic loosening, infection, stiffness, dislocation/subluxation,  thromboembolic complications and other imponderables were discussed.  The patient acknowledged the explanation, agreed to proceed with the plan and consent was signed. Patient is being admitted for inpatient treatment for surgery, pain control, PT, OT, prophylactic antibiotics, VTE prophylaxis, progressive ambulation and ADL's and discharge planning.The patient is planning to be discharged home with home health services ° ° ° °

## 2021-10-25 NOTE — Anesthesia Preprocedure Evaluation (Addendum)
Anesthesia Evaluation  Patient identified by MRN, date of birth, ID band Patient awake    Reviewed: Allergy & Precautions, NPO status , Patient's Chart, lab work & pertinent test results, reviewed documented beta blocker date and time   History of Anesthesia Complications Negative for: history of anesthetic complications  Airway Mallampati: II  TM Distance: >3 FB Neck ROM: Full    Dental  (+) Poor Dentition, Dental Advisory Given, Missing, Chipped   Pulmonary former smoker,  10/24/2020 SARS coronavirus NEG   breath sounds clear to auscultation       Cardiovascular hypertension, Pt. on medications and Pt. on home beta blockers (-) angina+ DVT   Rhythm:Regular Rate:Normal  06/2021 ECHO: EF 65-70%, normal LVF, Grade 1 DD, normal RVF, mild-mod MR, mild TR   Neuro/Psych CVA (L weakness) negative psych ROS   GI/Hepatic Neg liver ROS, GERD  Medicated and Controlled,  Endo/Other  negative endocrine ROS  Renal/GU negative Renal ROS     Musculoskeletal  (+) Arthritis ,   Abdominal   Peds  Hematology H/o CLL plt 276k   Anesthesia Other Findings   Reproductive/Obstetrics                            Anesthesia Physical Anesthesia Plan  ASA: 3  Anesthesia Plan: Spinal   Post-op Pain Management: Tylenol PO (pre-op)   Induction: Intravenous  PONV Risk Score and Plan: 1 and Ondansetron and Treatment may vary due to age or medical condition  Airway Management Planned: Natural Airway and Simple Face Mask  Additional Equipment: None  Intra-op Plan:   Post-operative Plan:   Informed Consent: I have reviewed the patients History and Physical, chart, labs and discussed the procedure including the risks, benefits and alternatives for the proposed anesthesia with the patient or authorized representative who has indicated his/her understanding and acceptance.     Dental advisory given  Plan Discussed  with: CRNA and Surgeon  Anesthesia Plan Comments:        Anesthesia Quick Evaluation

## 2021-10-26 ENCOUNTER — Encounter (HOSPITAL_COMMUNITY): Payer: Self-pay | Admitting: Orthopaedic Surgery

## 2021-10-26 ENCOUNTER — Ambulatory Visit (HOSPITAL_COMMUNITY): Payer: Medicare Other | Admitting: Physician Assistant

## 2021-10-26 ENCOUNTER — Telehealth: Payer: Self-pay | Admitting: Internal Medicine

## 2021-10-26 ENCOUNTER — Observation Stay (HOSPITAL_COMMUNITY)
Admission: RE | Admit: 2021-10-26 | Discharge: 2021-10-27 | Disposition: A | Discharge status: Home / Self Care | Payer: Medicare Other | Attending: Orthopaedic Surgery | Admitting: Orthopaedic Surgery

## 2021-10-26 ENCOUNTER — Encounter (HOSPITAL_COMMUNITY)
Admission: RE | Disposition: A | Discharge status: Home / Self Care | Payer: Self-pay | Source: Home / Self Care | Attending: Orthopaedic Surgery

## 2021-10-26 ENCOUNTER — Ambulatory Visit (HOSPITAL_COMMUNITY): Payer: Medicare Other

## 2021-10-26 ENCOUNTER — Observation Stay (HOSPITAL_COMMUNITY): Payer: Medicare Other

## 2021-10-26 ENCOUNTER — Other Ambulatory Visit: Payer: Self-pay

## 2021-10-26 ENCOUNTER — Ambulatory Visit (HOSPITAL_COMMUNITY): Payer: Medicare Other | Admitting: Anesthesiology

## 2021-10-26 DIAGNOSIS — Z01818 Encounter for other preprocedural examination: Secondary | ICD-10-CM

## 2021-10-26 DIAGNOSIS — M1612 Unilateral primary osteoarthritis, left hip: Secondary | ICD-10-CM | POA: Diagnosis not present

## 2021-10-26 DIAGNOSIS — Z79899 Other long term (current) drug therapy: Secondary | ICD-10-CM | POA: Diagnosis not present

## 2021-10-26 DIAGNOSIS — Z419 Encounter for procedure for purposes other than remedying health state, unspecified: Secondary | ICD-10-CM

## 2021-10-26 DIAGNOSIS — Z85048 Personal history of other malignant neoplasm of rectum, rectosigmoid junction, and anus: Secondary | ICD-10-CM | POA: Diagnosis not present

## 2021-10-26 DIAGNOSIS — Z96642 Presence of left artificial hip joint: Secondary | ICD-10-CM

## 2021-10-26 DIAGNOSIS — Z87891 Personal history of nicotine dependence: Secondary | ICD-10-CM | POA: Insufficient documentation

## 2021-10-26 DIAGNOSIS — I10 Essential (primary) hypertension: Secondary | ICD-10-CM | POA: Insufficient documentation

## 2021-10-26 HISTORY — PX: TOTAL HIP ARTHROPLASTY: SHX124

## 2021-10-26 LAB — GLUCOSE, CAPILLARY: Glucose-Capillary: 104 mg/dL — ABNORMAL HIGH (ref 70–99)

## 2021-10-26 LAB — ABO/RH: ABO/RH(D): AB POS

## 2021-10-26 LAB — TYPE AND SCREEN
ABO/RH(D): AB POS
Antibody Screen: NEGATIVE

## 2021-10-26 SURGERY — ARTHROPLASTY, HIP, TOTAL, ANTERIOR APPROACH
Anesthesia: Spinal | Site: Hip | Laterality: Left

## 2021-10-26 MED ORDER — PHENYLEPHRINE HCL (PRESSORS) 10 MG/ML IV SOLN
INTRAVENOUS | Status: DC | PRN
  Administered 2021-10-26 (×5): 80 ug via INTRAVENOUS

## 2021-10-26 MED ORDER — ONDANSETRON HCL 4 MG/2ML IJ SOLN: 4.0000 mg | Freq: Four times a day (QID) | INTRAMUSCULAR | Status: DC | PRN

## 2021-10-26 MED ORDER — ACETAMINOPHEN 325 MG PO TABS: 325.0000 mg | ORAL_TABLET | Freq: Four times a day (QID) | ORAL | Status: DC | PRN

## 2021-10-26 MED ORDER — METHOCARBAMOL 500 MG PO TABS
500.0000 mg | ORAL_TABLET | Freq: Four times a day (QID) | ORAL | Status: DC | PRN
  Administered 2021-10-26 – 2021-10-27 (×2): 500 mg via ORAL
  Filled 2021-10-26 (×2): qty 1

## 2021-10-26 MED ORDER — STERILE WATER FOR IRRIGATION IR SOLN
Status: DC | PRN
  Administered 2021-10-26: 2000 mL

## 2021-10-26 MED ORDER — MENTHOL 3 MG MT LOZG: 1.0000 | LOZENGE | OROMUCOSAL | Status: DC | PRN

## 2021-10-26 MED ORDER — DOCUSATE SODIUM 100 MG PO CAPS
100.0000 mg | ORAL_CAPSULE | Freq: Two times a day (BID) | ORAL | Status: DC
  Administered 2021-10-26 – 2021-10-27 (×2): 100 mg via ORAL
  Filled 2021-10-26 (×2): qty 1

## 2021-10-26 MED ORDER — ALLOPURINOL 300 MG PO TABS
300.0000 mg | ORAL_TABLET | Freq: Every day | ORAL | Status: DC
  Administered 2021-10-27: 300 mg via ORAL
  Filled 2021-10-26: qty 1

## 2021-10-26 MED ORDER — OXYCODONE HCL 5 MG PO TABS
10.0000 mg | ORAL_TABLET | ORAL | Status: DC | PRN
  Administered 2021-10-26 – 2021-10-27 (×3): 10 mg via ORAL
  Filled 2021-10-26: qty 2

## 2021-10-26 MED ORDER — METHOCARBAMOL 500 MG IVPB - SIMPLE MED
500.0000 mg | Freq: Four times a day (QID) | INTRAVENOUS | Status: DC | PRN
  Filled 2021-10-26: qty 50

## 2021-10-26 MED ORDER — HYDROMORPHONE HCL 1 MG/ML IJ SOLN: 0.2500 mg | INTRAMUSCULAR | Status: DC | PRN

## 2021-10-26 MED ORDER — FENTANYL CITRATE (PF) 100 MCG/2ML IJ SOLN
INTRAMUSCULAR | Status: AC
  Filled 2021-10-26: qty 2

## 2021-10-26 MED ORDER — AMLODIPINE BESYLATE 10 MG PO TABS
10.0000 mg | ORAL_TABLET | Freq: Every day | ORAL | Status: DC
  Administered 2021-10-27: 10 mg via ORAL
  Filled 2021-10-26: qty 1

## 2021-10-26 MED ORDER — HYDROMORPHONE HCL 1 MG/ML IJ SOLN
0.5000 mg | INTRAMUSCULAR | Status: DC | PRN
  Administered 2021-10-26: 1 mg via INTRAVENOUS
  Filled 2021-10-26: qty 1

## 2021-10-26 MED ORDER — CLONIDINE HCL 0.1 MG PO TABS: 0.1000 mg | ORAL_TABLET | Freq: Every day | ORAL | Status: DC | PRN

## 2021-10-26 MED ORDER — ORAL CARE MOUTH RINSE: 15.0000 mL | Freq: Once | OROMUCOSAL | Status: AC

## 2021-10-26 MED ORDER — METOCLOPRAMIDE HCL 5 MG PO TABS: 5.0000 mg | ORAL_TABLET | Freq: Three times a day (TID) | ORAL | Status: DC | PRN

## 2021-10-26 MED ORDER — OXYCODONE HCL 5 MG/5ML PO SOLN
5.0000 mg | Freq: Once | ORAL | Status: DC | PRN
Start: 2021-10-26 — End: 2021-10-26

## 2021-10-26 MED ORDER — OXYCODONE HCL 5 MG PO TABS
5.0000 mg | ORAL_TABLET | ORAL | Status: DC | PRN
  Administered 2021-10-26: 10 mg via ORAL
  Filled 2021-10-26 (×3): qty 2

## 2021-10-26 MED ORDER — ONDANSETRON HCL 4 MG/2ML IJ SOLN
INTRAMUSCULAR | Status: DC | PRN
  Administered 2021-10-26: 4 mg via INTRAVENOUS

## 2021-10-26 MED ORDER — POVIDONE-IODINE 10 % EX SWAB
2.0000 "application " | Freq: Once | CUTANEOUS | Status: AC
  Administered 2021-10-26: 2 via TOPICAL

## 2021-10-26 MED ORDER — ENALAPRIL MALEATE 10 MG PO TABS
20.0000 mg | ORAL_TABLET | Freq: Two times a day (BID) | ORAL | Status: DC
  Administered 2021-10-27: 20 mg via ORAL
  Filled 2021-10-26 (×2): qty 2

## 2021-10-26 MED ORDER — PROPOFOL 500 MG/50ML IV EMUL
INTRAVENOUS | Status: DC | PRN
  Administered 2021-10-26: 75 ug/kg/min via INTRAVENOUS

## 2021-10-26 MED ORDER — BUPIVACAINE IN DEXTROSE 0.75-8.25 % IT SOLN
INTRATHECAL | Status: DC | PRN
  Administered 2021-10-26: 12 mg via INTRATHECAL

## 2021-10-26 MED ORDER — PANTOPRAZOLE SODIUM 40 MG PO TBEC
40.0000 mg | DELAYED_RELEASE_TABLET | Freq: Every day | ORAL | Status: DC
  Administered 2021-10-26 – 2021-10-27 (×2): 40 mg via ORAL
  Filled 2021-10-26 (×2): qty 1

## 2021-10-26 MED ORDER — OXYCODONE HCL 5 MG PO TABS
5.0000 mg | ORAL_TABLET | Freq: Once | ORAL | Status: DC | PRN
Start: 2021-10-26 — End: 2021-10-26

## 2021-10-26 MED ORDER — PHENYLEPHRINE HCL (PRESSORS) 10 MG/ML IV SOLN
INTRAVENOUS | Status: AC
  Filled 2021-10-26: qty 1

## 2021-10-26 MED ORDER — MIDAZOLAM HCL 2 MG/2ML IJ SOLN: 0.5000 mg | Freq: Once | INTRAMUSCULAR | Status: DC | PRN

## 2021-10-26 MED ORDER — LACTATED RINGERS IV SOLN: INTRAVENOUS | Status: DC

## 2021-10-26 MED ORDER — PROMETHAZINE HCL 25 MG/ML IJ SOLN: 6.2500 mg | INTRAMUSCULAR | Status: DC | PRN

## 2021-10-26 MED ORDER — CEFAZOLIN SODIUM-DEXTROSE 1-4 GM/50ML-% IV SOLN
1.0000 g | Freq: Four times a day (QID) | INTRAVENOUS | Status: AC
  Administered 2021-10-26 (×2): 1 g via INTRAVENOUS
  Filled 2021-10-26 (×2): qty 50

## 2021-10-26 MED ORDER — HYDROCHLOROTHIAZIDE 25 MG PO TABS
25.0000 mg | ORAL_TABLET | Freq: Every day | ORAL | Status: DC
  Administered 2021-10-27: 25 mg via ORAL
  Filled 2021-10-26: qty 1

## 2021-10-26 MED ORDER — IBRUTINIB 420 MG PO TABS: 420.0000 mg | ORAL_TABLET | Freq: Every day | ORAL | Status: DC

## 2021-10-26 MED ORDER — DEXAMETHASONE SODIUM PHOSPHATE 10 MG/ML IJ SOLN
INTRAMUSCULAR | Status: DC | PRN
  Administered 2021-10-26: 5 mg via INTRAVENOUS

## 2021-10-26 MED ORDER — SODIUM CHLORIDE 0.9 % IV SOLN: INTRAVENOUS | Status: DC

## 2021-10-26 MED ORDER — PHENYLEPHRINE HCL-NACL 20-0.9 MG/250ML-% IV SOLN
INTRAVENOUS | Status: DC | PRN
  Administered 2021-10-26: 40 ug/min via INTRAVENOUS

## 2021-10-26 MED ORDER — METOCLOPRAMIDE HCL 5 MG/ML IJ SOLN: 5.0000 mg | Freq: Three times a day (TID) | INTRAMUSCULAR | Status: DC | PRN

## 2021-10-26 MED ORDER — SODIUM CHLORIDE 0.9 % IR SOLN
Status: DC | PRN
  Administered 2021-10-26: 1000 mL

## 2021-10-26 MED ORDER — 0.9 % SODIUM CHLORIDE (POUR BTL) OPTIME
TOPICAL | Status: DC | PRN
  Administered 2021-10-26: 1000 mL

## 2021-10-26 MED ORDER — PHENOL 1.4 % MT LIQD: 1.0000 | OROMUCOSAL | Status: DC | PRN

## 2021-10-26 MED ORDER — CHLORHEXIDINE GLUCONATE 0.12 % MT SOLN
15.0000 mL | Freq: Once | OROMUCOSAL | Status: AC
  Administered 2021-10-26: 15 mL via OROMUCOSAL

## 2021-10-26 MED ORDER — TRANEXAMIC ACID-NACL 1000-0.7 MG/100ML-% IV SOLN
1000.0000 mg | INTRAVENOUS | Status: AC
  Administered 2021-10-26: 1000 mg via INTRAVENOUS
  Filled 2021-10-26: qty 100

## 2021-10-26 MED ORDER — ACETAMINOPHEN 500 MG PO TABS
1000.0000 mg | ORAL_TABLET | Freq: Once | ORAL | Status: AC
  Administered 2021-10-26: 1000 mg via ORAL
  Filled 2021-10-26: qty 2

## 2021-10-26 MED ORDER — ASPIRIN 81 MG PO CHEW
81.0000 mg | CHEWABLE_TABLET | Freq: Two times a day (BID) | ORAL | Status: DC
  Administered 2021-10-26 – 2021-10-27 (×2): 81 mg via ORAL
  Filled 2021-10-26 (×2): qty 1

## 2021-10-26 MED ORDER — ROSUVASTATIN CALCIUM 20 MG PO TABS
20.0000 mg | ORAL_TABLET | Freq: Every day | ORAL | Status: DC
  Administered 2021-10-26: 20 mg via ORAL
  Filled 2021-10-26: qty 1

## 2021-10-26 MED ORDER — DIPHENHYDRAMINE HCL 12.5 MG/5ML PO ELIX: 12.5000 mg | ORAL_SOLUTION | ORAL | Status: DC | PRN

## 2021-10-26 MED ORDER — CARVEDILOL 6.25 MG PO TABS
6.2500 mg | ORAL_TABLET | Freq: Two times a day (BID) | ORAL | Status: DC
  Administered 2021-10-26 – 2021-10-27 (×2): 6.25 mg via ORAL
  Filled 2021-10-26 (×2): qty 1

## 2021-10-26 MED ORDER — ALUM & MAG HYDROXIDE-SIMETH 200-200-20 MG/5ML PO SUSP: 30.0000 mL | ORAL | Status: DC | PRN

## 2021-10-26 MED ORDER — ONDANSETRON HCL 4 MG PO TABS: 4.0000 mg | ORAL_TABLET | Freq: Four times a day (QID) | ORAL | Status: DC | PRN

## 2021-10-26 MED ORDER — CEFAZOLIN SODIUM-DEXTROSE 2-4 GM/100ML-% IV SOLN
2.0000 g | INTRAVENOUS | Status: AC
  Administered 2021-10-26: 2 g via INTRAVENOUS
  Filled 2021-10-26: qty 100

## 2021-10-26 MED ORDER — FENTANYL CITRATE (PF) 100 MCG/2ML IJ SOLN
INTRAMUSCULAR | Status: DC | PRN
  Administered 2021-10-26: 100 ug via INTRAVENOUS

## 2021-10-26 SURGICAL SUPPLY — 42 items
ACETAB CUP W/GRIPTION 54 (Plate) ×2 IMPLANT
BAG COUNTER SPONGE SURGICOUNT (BAG) ×2 IMPLANT
BAG ZIPLOCK 12X15 (MISCELLANEOUS) IMPLANT
BENZOIN TINCTURE PRP APPL 2/3 (GAUZE/BANDAGES/DRESSINGS) IMPLANT
BLADE SAW SGTL 18X1.27X75 (BLADE) ×2 IMPLANT
COVER PERINEAL POST (MISCELLANEOUS) ×2 IMPLANT
COVER SURGICAL LIGHT HANDLE (MISCELLANEOUS) ×2 IMPLANT
CUP ACETAB W/GRIPTION 54 (Plate) IMPLANT
DRAPE FOOT SWITCH (DRAPES) ×2 IMPLANT
DRAPE STERI IOBAN 125X83 (DRAPES) ×2 IMPLANT
DRAPE U-SHAPE 47X51 STRL (DRAPES) ×4 IMPLANT
DRSG AQUACEL AG ADV 3.5X10 (GAUZE/BANDAGES/DRESSINGS) ×2 IMPLANT
DURAPREP 26ML APPLICATOR (WOUND CARE) ×2 IMPLANT
ELECT REM PT RETURN 15FT ADLT (MISCELLANEOUS) ×2 IMPLANT
GAUZE XEROFORM 1X8 LF (GAUZE/BANDAGES/DRESSINGS) ×2 IMPLANT
GLOVE SRG 8 PF TXTR STRL LF DI (GLOVE) ×2 IMPLANT
GLOVE SURG ENC MOIS LTX SZ7.5 (GLOVE) ×2 IMPLANT
GLOVE SURG NEOPR MICRO LF SZ8 (GLOVE) ×2 IMPLANT
GLOVE SURG UNDER POLY LF SZ8 (GLOVE) ×2
GOWN STRL REUS W/TWL XL LVL3 (GOWN DISPOSABLE) ×4 IMPLANT
HANDPIECE INTERPULSE COAX TIP (DISPOSABLE) ×1
HEAD M SROM 36MM 2 (Hips) IMPLANT
HOLDER FOLEY CATH W/STRAP (MISCELLANEOUS) ×2 IMPLANT
KIT TURNOVER KIT A (KITS) ×1 IMPLANT
LINER NEUTRAL 54X36MM PLUS 4 (Hips) ×1 IMPLANT
PACK ANTERIOR HIP CUSTOM (KITS) ×2 IMPLANT
SCREW 6.5MMX25MM (Screw) ×1 IMPLANT
SET HNDPC FAN SPRY TIP SCT (DISPOSABLE) ×1 IMPLANT
SPONGE T-LAP 18X18 ~~LOC~~+RFID (SPONGE) ×5 IMPLANT
SROM M HEAD 36MM 2 (Hips) ×2 IMPLANT
STAPLER VISISTAT 35W (STAPLE) IMPLANT
STEM FEM ACTIS STD SZ4 (Stem) ×1 IMPLANT
STRIP CLOSURE SKIN 1/2X4 (GAUZE/BANDAGES/DRESSINGS) IMPLANT
SUT ETHIBOND NAB CT1 #1 30IN (SUTURE) ×2 IMPLANT
SUT ETHILON 2 0 PS N (SUTURE) IMPLANT
SUT MNCRL AB 4-0 PS2 18 (SUTURE) IMPLANT
SUT VIC AB 0 CT1 36 (SUTURE) ×2 IMPLANT
SUT VIC AB 1 CT1 36 (SUTURE) ×2 IMPLANT
SUT VIC AB 2-0 CT1 27 (SUTURE) ×2
SUT VIC AB 2-0 CT1 TAPERPNT 27 (SUTURE) ×2 IMPLANT
TRAY FOLEY MTR SLVR 16FR STAT (SET/KITS/TRAYS/PACK) ×1 IMPLANT
YANKAUER SUCT BULB TIP NO VENT (SUCTIONS) ×2 IMPLANT

## 2021-10-26 NOTE — Evaluation (Signed)
Physical Therapy Evaluation Patient Details Name: Aaron Gonzalez MRN: [id number] DOB: 23-Feb-1946 Today's Date: 10/26/2021  History of Present Illness  Pt s/p L THR and with hx of R THR, CLL, CVA (pt states minimal residual deficits), and rectal CA  Clinical Impression  Pt s/p L THR and presents with decreased L LE strength/ROM and post op pain limiting functional mobility.  Pt should progress to dc home with family assist and will benefit from follow up HHPT to maximize IND and safety.     Recommendations for follow up therapy are one component of a multi-disciplinary discharge planning process, led by the attending physician.  Recommendations may be updated based on patient status, additional functional criteria and insurance authorization.  Follow Up Recommendations Follow physician's recommendations for discharge plan and follow up therapies    Assistance Recommended at Discharge Frequent or constant Supervision/Assistance  Patient can return home with the following       Equipment Recommendations None recommended by PT  Recommendations for Other Services       Functional Status Assessment Patient has had a recent decline in their functional status and demonstrates the ability to make significant improvements in function in a reasonable and predictable amount of time.     Precautions / Restrictions Precautions Precautions: Fall Restrictions Weight Bearing Restrictions: No      Mobility  Bed Mobility Overal bed mobility: Needs Assistance Bed Mobility: Supine to Sit     Supine to sit: Min assist;Mod assist     General bed mobility comments: cues for sequence with assist to bring trunk to upright    Transfers Overall transfer level: Needs assistance   Transfers: Sit to/from Stand Sit to Stand: Min assist;Mod assist           General transfer comment: cues for LE management and use of UEs to self assist    Ambulation/Gait Ambulation/Gait assistance: Min  assist Gait Distance (Feet): 58 Feet Assistive device: Rolling walker (2 wheels) Gait Pattern/deviations: Step-to pattern;Step-through pattern;Decreased step length - right;Decreased step length - left;Shuffle;Trunk flexed       General Gait Details: cues for posture, position from RW and initial sequence  Stairs            Wheelchair Mobility    Modified Rankin (Stroke Patients Only)       Balance Overall balance assessment: Needs assistance Sitting-balance support: No upper extremity supported;Feet supported Sitting balance-Leahy Scale: Good     Standing balance support: Bilateral upper extremity supported Standing balance-Leahy Scale: Poor                               Pertinent Vitals/Pain Pain Assessment: 0-10 Pain Score: 5  Pain Location: L hip Pain Descriptors / Indicators: Aching;Sore Pain Intervention(s): Limited activity within patient's tolerance;Monitored during session;Premedicated before session    Home Living Family/patient expects to be discharged to:: Private residence Living Arrangements: Other relatives Available Help at Discharge: Family;Available 24 hours/day Type of Home: House Home Access: Stairs to enter Entrance Stairs-Rails: Can reach both Entrance Stairs-Number of Steps: 5 in front with Bil rail that can be reached at same time.  In back there are 2 shallow steps, a platform then 1 more step into house all without rail.   Home Layout: One level Home Equipment: Conservation officer, nature (2 wheels);BSC/3in1;Cane - quad;Cane - single point;Shower seat      Prior Function Prior Level of Function : Independent/Modified Independent  Mobility Comments: Pt states used RW at all times       Hand Dominance   Dominant Hand: Left    Extremity/Trunk Assessment   Upper Extremity Assessment Upper Extremity Assessment: Overall WFL for tasks assessed    Lower Extremity Assessment Lower Extremity Assessment: LLE  deficits/detail    Cervical / Trunk Assessment Cervical / Trunk Assessment: Normal  Communication   Communication: No difficulties  Cognition Arousal/Alertness: Awake/alert Behavior During Therapy: WFL for tasks assessed/performed Overall Cognitive Status: Within Functional Limits for tasks assessed                                 General Comments: Some delayed processing with cues and questions        General Comments      Exercises Total Joint Exercises Ankle Circles/Pumps: AROM;Both;15 reps;Supine   Assessment/Plan    PT Assessment Patient needs continued PT services  PT Problem List Decreased strength;Decreased range of motion;Decreased activity tolerance;Decreased balance;Decreased mobility;Decreased knowledge of use of DME;Pain       PT Treatment Interventions DME instruction;Gait training;Stair training;Functional mobility training;Therapeutic activities;Therapeutic exercise;Patient/family education    PT Goals (Current goals can be found in the Care Plan section)  Acute Rehab PT Goals Patient Stated Goal: Regain IND PT Goal Formulation: With patient Time For Goal Achievement: 11/09/21 Potential to Achieve Goals: Good    Frequency 7X/week     Co-evaluation               AM-PAC PT "6 Clicks" Mobility  Outcome Measure Help needed turning from your back to your side while in a flat bed without using bedrails?: A Lot Help needed moving from lying on your back to sitting on the side of a flat bed without using bedrails?: A Lot Help needed moving to and from a bed to a chair (including a wheelchair)?: A Lot Help needed standing up from a chair using your arms (e.g., wheelchair or bedside chair)?: A Lot Help needed to walk in hospital room?: A Little Help needed climbing 3-5 steps with a railing? : A Lot 6 Click Score: 13    End of Session Equipment Utilized During Treatment: Gait belt Activity Tolerance: Patient tolerated treatment  well Patient left: in chair;with call bell/phone within reach;with chair alarm set;with family/visitor present Nurse Communication: Mobility status PT Visit Diagnosis: Difficulty in walking, not elsewhere classified (R26.2);Unsteadiness on feet (R26.81)    Time: 1761-6073 PT Time Calculation (min) (ACUTE ONLY): 30 min   Charges:   PT Evaluation $PT Eval Low Complexity: 1 Low PT Treatments $Gait Training: 8-22 mins        [place] Pager [phone] Office [phone]   Channa Hazelett 10/26/2021, 4:27 PM

## 2021-10-26 NOTE — Interval H&P Note (Signed)
History and Physical Interval Note: The patient understands that he is here today for a left hip replacement to treat his severe left hip osteoarthritis.  There has been no acute or interval change in his medical status.  Please see recent H&P.  The risks and benefits of surgery been explained in detail and informed consent is obtained.  The left hip has been marked.  10/26/2021 7:00 AM  Aaron Gonzalez  has presented today for surgery, with the diagnosis of Osteoarthritis, Degenerative Joint Diease Left Hip.  The various methods of treatment have been discussed with the patient and family. After consideration of risks, benefits and other options for treatment, the patient has consented to  Procedure(s) with comments: Left [place] (Left) - RNFA as a surgical intervention.  The patient's history has been reviewed, patient examined, no change in status, stable for surgery.  I have reviewed the patient's chart and labs.  Questions were answered to the patient's satisfaction.     Mcarthur Rossetti

## 2021-10-26 NOTE — Brief Op Note (Signed)
10/26/2021  8:47 AM  PATIENT:  Aaron Gonzalez  76 y.o. male  PRE-OPERATIVE DIAGNOSIS:  Osteoarthritis, Degenerative Joint Diease Left Hip  POST-OPERATIVE DIAGNOSIS:  Osteoarthritis, Degenerative Joint Diease Left Hip  PROCEDURE:  Procedure(s) with comments: Left TOTAL HIP ARTHROPLASTY ANTERIOR APPROACH (Left) - RNFA  SURGEON:  Surgeon(s) and Role:    Mcarthur Rossetti, MD - Primary  ASSISTANTS: Vicki Mallet, RNFA   ANESTHESIA:   spinal  EBL:  200 mL   COUNTS:  YES  TOURNIQUET:  * No tourniquets in log *  DICTATION: .Other Dictation: Dictation Number [id number]  PLAN OF CARE: Admit for overnight observation  PATIENT DISPOSITION:  PACU - hemodynamically stable.   Delay start of Pharmacological VTE agent (>24hrs) due to surgical blood loss or risk of bleeding: no

## 2021-10-26 NOTE — Progress Notes (Signed)
Pharmacy Note   Per P and T committee, the following listed in the hold criteria for ibrutinib    Ibrutinib (Imbruvica) hold criteria: ANC < 750 Platelet count < 50,000 SCr > 1.5x baseline (or > 2 if baseline unknown) Bili > 1.5x ULN AST or ALT > 3 ULN NYHA Class 3 or 4 Heart failure Active infection Hemorrhage 3-7 days pre- or post-surgery   As pt is POD0 on 1/6, will discontinue medication per P and T committee policy.    Royetta Asal, PharmD, BCPS 10/26/2021 10:55 AM

## 2021-10-26 NOTE — Plan of Care (Signed)
°  Problem: Clinical Measurements: Goal: Ability to maintain clinical measurements within normal limits will improve Outcome: Progressing   Problem: Activity: Goal: Risk for activity intolerance will decrease Outcome: Progressing   Problem: Elimination: Goal: Will not experience complications related to bowel motility Outcome: Progressing   Problem: Pain Managment: Goal: General experience of comfort will improve Outcome: Progressing

## 2021-10-26 NOTE — Plan of Care (Signed)

## 2021-10-26 NOTE — Plan of Care (Deleted)
Report called to Pamme at [facility].

## 2021-10-26 NOTE — Transfer of Care (Signed)
Immediate Anesthesia Transfer of Care Note  Patient: Aaron Gonzalez  Procedure(s) Performed: Left TOTAL HIP ARTHROPLASTY ANTERIOR APPROACH (Left: Hip)  Patient Location: PACU  Anesthesia Type:Spinal  Level of Consciousness: sedated, patient cooperative and responds to stimulation  Airway & Oxygen Therapy: Patient Spontanous Breathing and Patient connected to face mask oxygen  Post-op Assessment: Report given to RN and Post -op Vital signs reviewed and stable  Post vital signs: Reviewed and stable  Last Vitals:  Vitals Value Taken Time  BP 92/61 10/26/21 0905  Temp    Pulse 56 10/26/21 0907  Resp 12 10/26/21 0907  SpO2 100 % 10/26/21 0907  Vitals shown include unvalidated device data.  Last Pain:  Vitals:   10/26/21 0601  TempSrc:   PainSc: 0-No pain      Patients Stated Pain Goal: 4 (44/46/19 0122)  Complications: No notable events documented.

## 2021-10-26 NOTE — Anesthesia Postprocedure Evaluation (Signed)
Anesthesia Post Note  Patient: QUALYN OYERVIDES  Procedure(s) Performed: Left TOTAL HIP ARTHROPLASTY ANTERIOR APPROACH (Left: Hip)     Patient location during evaluation: PACU Anesthesia Type: Spinal Level of consciousness: awake and alert, patient cooperative and oriented Pain management: pain level controlled Vital Signs Assessment: post-procedure vital signs reviewed and stable Respiratory status: nonlabored ventilation, spontaneous breathing and respiratory function stable Cardiovascular status: blood pressure returned to baseline and stable Postop Assessment: no apparent nausea or vomiting, patient able to bend at knees and spinal receding Anesthetic complications: no   No notable events documented.  Last Vitals:  Vitals:   10/26/21 1025 10/26/21 1040  BP: 113/76 120/72  Pulse: (!) 58 62  Resp: 12 14  Temp: (!) 36.4 C (!) 36.4 C  SpO2: 94% 94%    Last Pain:  Vitals:   10/26/21 1040  TempSrc: Oral  PainSc:                  Kandyce Dieguez,E. Oniel Meleski

## 2021-10-26 NOTE — Telephone Encounter (Signed)
Sch per 1/6 inbasket, pt sister aware

## 2021-10-26 NOTE — Anesthesia Procedure Notes (Signed)
Spinal  Patient location during procedure: OR End time: 10/26/2021 7:26 AM Reason for block: surgical anesthesia Staffing Performed: anesthesiologist  Anesthesiologist: Annye Asa, MD Preanesthetic Checklist Completed: patient identified, IV checked, site marked, risks and benefits discussed, surgical consent, monitors and equipment checked, pre-op evaluation and timeout performed Spinal Block Patient position: sitting Prep: DuraPrep and site prepped and draped Patient monitoring: blood pressure, continuous pulse ox, cardiac monitor and heart rate Approach: midline Location: L3-4 Injection technique: single-shot Needle Needle type: Pencan and Introducer  Needle gauge: 24 G Needle length: 9 cm Assessment Events: CSF return Additional Notes Pt identified in Operating room.  Monitors applied. Working IV access confirmed. Sterile prep, drape lumbar spine.  1% lido local L 3,4.  #24ga Pencan into clear CSF L 3,4.  12mg  0.75% Bupivacaine with dextrose injected with asp CSF beginning and end of injection.  Patient asymptomatic, VSS, no heme aspirated, tolerated well.  Jenita Seashore, MD

## 2021-10-26 NOTE — Op Note (Signed)
NAMEGEROME, KOKESH MEDICAL RECORD NO: [id number] ACCOUNT NO: [id number] DATE OF BIRTH: 1946-10-11 FACILITY: [facility] LOCATION: [facility] PHYSICIAN: Lind Guest. Ninfa Linden, MD  Operative Report   DATE OF PROCEDURE: 10/26/2021   PREOPERATIVE DIAGNOSIS:  Primary osteoarthritis and degenerative joint disease, left hip.  POSTOPERATIVE DIAGNOSIS:  Primary osteoarthritis and degenerative joint disease, left hip.  PROCEDURE:  Left total hip arthroplasty through direct anterior approach.  IMPLANTS:  DePuy sector Gription acetabular component, size 54, size 36+4 neutral polyethylene liner, single screw in the acetabulum, size 4 ACTIS femoral component with standard offset, size 36-2 metal hip ball.  SURGEON:  Jean Rosenthal, M.D.  ASSISTANT: Amanda Pea, RNFA  ANTIBIOTICS:  2 g IV Ancef.  ESTIMATED BLOOD LOSS:  371 mL  COMPLICATIONS:  None.  INDICATIONS:  The patient is a 76 year old gentleman who has moved from up [place] to [place] to be around family.  He had a successful right total hip arthroplasty done up there a few years ago.  He has debilitating end-stage arthritis involving his  left hip.  He has had a stroke and this has weakened his left side as well.  At this point, his left hip pain is daily and it is detrimentally affecting his activities of daily living, his quality of life and his mobility.  His x-rays show complete loss  of joint space with bone-on-bone wear.  At this point, he does wish to proceed with a total hip arthroplasty for the left hip.  We did talk about the risk of acute blood loss anemia, nerve or vessel injury, fracture, infection, DVT, implant failure, leg  length discrepancies and skin and soft tissue issues.  We talked about our goals being decreased pain, improve mobility and overall improve quality of life.  DESCRIPTION OF PROCEDURE:  After informed consent was obtained, appropriate left hip was marked, he was brought to the operating room and  sat up on the stretcher where spinal anesthesia was obtained, he was laid in supine position on the stretcher.   Foley catheter was placed, I assessed his leg length, he is definitely short and contracted on the left side.  Traction boots were placed on both his feet.  He was placed supine on the Hana fracture table, the perineal post in place both legs in line  skeletal traction device and no traction applied.  His left operative hip was prepped and draped with DuraPrep and sterile drapes.  A timeout was called and he was identified correct patient, correct left hip.  I then made an incision just inferior and  posterior to the anterior superior iliac spine and carried this obliquely down the leg.  We dissected down tensor fascia lata muscle.  Tensor fascia was then divided longitudinally to proceed with direct anterior approach to the hip.  We identified and  cauterized circumflex vessels then identified the hip capsule, opened the hip capsule in L-type format finding a very large joint effusion and significant periarticular osteophytes around the lateral femoral head and neck.  We placed curved retractors  around the medial and lateral femoral neck and made a femoral neck cut with oscillating saw just proximal to the lesser trochanter and completed this with an osteotome.  I placed a corkscrew guide in the femoral head and removed the femoral head in its  entirety and found a wide area devoid of cartilage.  I then placed a bent Hohmann over the medial acetabular rim and removed remnants of acetabular labrum and other debris.  We then began  reaming using the reamer from a size 43 reamer in a stepwise  increments going up to a size 53 reamer with all reamers placed under direct visualization.  Last reamer was also placed under direct fluoroscopy, so I could obtain my depth of reaming my inclination and anteversion.  I then placed real DePuy sector  Gription acetabular component, size 54.  I then placed a  single screw due to sclerotic bone.  I then placed a 36+4 polyethylene liner based on his offset and medialization of the acetabular component.  Attention was then turned to the femur.  With the  leg externally rotated to 120 degrees and extended and adducted, I was able to place a Mueller retractor medially and Hohman retractor behind the greater trochanter, I released lateral joint capsule and used a box cutting osteotome to enter femoral canal  and a rongeur to lateralize, then began broaching using the ACTIS broaching system from a size 0 going up to a size 4.  With a size 4 in place, we trialed standard offset femoral neck and given his contracture a 36-2 hip ball, reduced this in acetabulum  where it was very tight.  We were actually pleased with leg length and offset as well as range of motion assessed clinically and mechanically.  We then dislocated the hip, removed the trial components.  I placed the real Actis femoral component, size 4  with standard offset and the real 36-2 metal hip ball and again reduced this in acetabulum.  We were pleased with stability.  We then irrigated the soft tissue with normal saline solution.  We assessed again radiographically.  We then closed the joint  capsule with interrupted #1 Ethibond suture followed by #1 Vicryl to close the tensor fascia.  0 Vicryl was used to close deep tissue and 2-0 Vicryl was used to close subcutaneous tissue.  The skin was closed with staples.  An Aquacel dressing was  applied.  He was taken off the Hana table and taken to recovery room in stable condition with all final counts being correct and no complications noted.     Elián.Darby D: 10/26/2021 8:46:14 am T: 10/26/2021 9:05:00 am  JOB: [id number]/ [id number]

## 2021-10-27 DIAGNOSIS — M1612 Unilateral primary osteoarthritis, left hip: Secondary | ICD-10-CM | POA: Diagnosis not present

## 2021-10-27 LAB — BASIC METABOLIC PANEL
Anion gap: 6 (ref 5–15)
BUN: 17 mg/dL (ref 8–23)
CO2: 27 mmol/L (ref 22–32)
Calcium: 8.3 mg/dL — ABNORMAL LOW (ref 8.9–10.3)
Chloride: 101 mmol/L (ref 98–111)
Creatinine, Ser: 0.85 mg/dL (ref 0.61–1.24)
GFR, Estimated: >60 mL/min (ref >60)
Glucose, Bld: 110 mg/dL — ABNORMAL HIGH (ref 70–99)
Potassium: 3.7 mmol/L (ref 3.5–5.1)
Sodium: 134 mmol/L — ABNORMAL LOW (ref 135–145)

## 2021-10-27 LAB — CBC
HCT: 32.9 % — ABNORMAL LOW (ref 39.0–52.0)
Hemoglobin: 10.6 g/dL — ABNORMAL LOW (ref 13.0–17.0)
MCH: 31.2 pg (ref 26.0–34.0)
MCHC: 32.2 g/dL (ref 30.0–36.0)
MCV: 96.8 fL (ref 80.0–100.0)
Platelets: 193 10*3/uL (ref 150–400)
RBC: 3.4 MIL/uL — ABNORMAL LOW (ref 4.22–5.81)
RDW: 15.5 % (ref 11.5–15.5)
WBC: 13 10*3/uL — ABNORMAL HIGH (ref 4.0–10.5)
nRBC: 0 % (ref 0.0–0.2)

## 2021-10-27 LAB — GLUCOSE, CAPILLARY
Glucose-Capillary: 102 mg/dL — ABNORMAL HIGH (ref 70–99)
Glucose-Capillary: 102 mg/dL — ABNORMAL HIGH (ref 70–99)

## 2021-10-27 MED ORDER — ASPIRIN 81 MG PO CHEW: 81.0000 mg | CHEWABLE_TABLET | Freq: Two times a day (BID) | ORAL | 0 refills | Status: DC

## 2021-10-27 MED ORDER — METHOCARBAMOL 500 MG PO TABS: 500.0000 mg | ORAL_TABLET | Freq: Four times a day (QID) | ORAL | 0 refills | Status: DC | PRN

## 2021-10-27 MED ORDER — OXYCODONE HCL 5 MG PO TABS: 5.0000 mg | ORAL_TABLET | Freq: Four times a day (QID) | ORAL | 0 refills | Status: DC | PRN

## 2021-10-27 NOTE — Progress Notes (Signed)
Physical Therapy Treatment Patient Details Name: Aaron Gonzalez MRN: [id number] DOB: Feb 18, 1946 Today's Date: 10/27/2021   History of Present Illness Pt s/p L THR and with hx of R THR, CLL, CVA (pt states minimal residual deficits), and rectal CA    PT Comments    Pt very cooperative and up to ambulate in hall, negotiated stairs, transferred to bed without physical assist and reviewed car transfers.  Pt eager to dc home this date.   Recommendations for follow up therapy are one component of a multi-disciplinary discharge planning process, led by the attending physician.  Recommendations may be updated based on patient status, additional functional criteria and insurance authorization.  Follow Up Recommendations  Follow physician's recommendations for discharge plan and follow up therapies     Assistance Recommended at Discharge Frequent or constant Supervision/Assistance  Patient can return home with the following     Equipment Recommendations  None recommended by PT    Recommendations for Other Services       Precautions / Restrictions Precautions Precautions: Fall Restrictions Weight Bearing Restrictions: No     Mobility  Bed Mobility Overal bed mobility: Needs Assistance Bed Mobility: Sit to Supine       Sit to supine: Min guard;Supervision   General bed mobility comments: Increased time with use of gait belt but no physical assist    Transfers Overall transfer level: Needs assistance   Transfers: Sit to/from Stand Sit to Stand: Min guard;Supervision           General transfer comment: cues for LE management and use of UEs to self assist    Ambulation/Gait Ambulation/Gait assistance: Min guard;Supervision Gait Distance (Feet): 80 Feet Assistive device: Rolling walker (2 wheels) Gait Pattern/deviations: Step-to pattern;Step-through pattern;Decreased step length - right;Decreased step length - left;Shuffle;Trunk flexed Gait velocity: decr     General  Gait Details: cues for posture, position from RW and initial sequence   Stairs Stairs: Yes Stairs assistance: Min guard Stair Management: Two rails;Step to pattern;Forwards Number of Stairs: 5 General stair comments: cues for sequence   Wheelchair Mobility    Modified Rankin (Stroke Patients Only)       Balance Overall balance assessment: Needs assistance Sitting-balance support: No upper extremity supported;Feet supported Sitting balance-Leahy Scale: Good     Standing balance support: No upper extremity supported Standing balance-Leahy Scale: Fair                              Cognition Arousal/Alertness: Awake/alert Behavior During Therapy: WFL for tasks assessed/performed Overall Cognitive Status: History of cognitive impairments - at baseline                                 General Comments: Some delayed processing with cues and questions.  Apparent memory deficits with questioning about family and home.        Exercises      General Comments        Pertinent Vitals/Pain Pain Assessment: 0-10 Pain Score: 4  Pain Location: L hip Pain Descriptors / Indicators: Aching;Sore Pain Intervention(s): Limited activity within patient's tolerance;Monitored during session;Premedicated before session    Home Living                          Prior Function            PT Goals (current  goals can now be found in the care plan section) Acute Rehab PT Goals Patient Stated Goal: Regain IND PT Goal Formulation: With patient Time For Goal Achievement: 11/09/21 Potential to Achieve Goals: Good Progress towards PT goals: Progressing toward goals    Frequency    7X/week      PT Plan Current plan remains appropriate    Co-evaluation              AM-PAC PT "6 Clicks" Mobility   Outcome Measure  Help needed turning from your back to your side while in a flat bed without using bedrails?: A Little Help needed moving from  lying on your back to sitting on the side of a flat bed without using bedrails?: A Little Help needed moving to and from a bed to a chair (including a wheelchair)?: A Little Help needed standing up from a chair using your arms (e.g., wheelchair or bedside chair)?: A Little Help needed to walk in hospital room?: A Little Help needed climbing 3-5 steps with a railing? : A Little 6 Click Score: 18    End of Session Equipment Utilized During Treatment: Gait belt Activity Tolerance: Patient tolerated treatment well Patient left: in bed;with call bell/phone within reach Nurse Communication: Mobility status PT Visit Diagnosis: Difficulty in walking, not elsewhere classified (R26.2);Unsteadiness on feet (R26.81)     Time: 1424-1450 PT Time Calculation (min) (ACUTE ONLY): 26 min  Charges:  $Gait Training: 8-22 mins $Therapeutic Activity: 8-22 mins                     Debe Coder PT Acute Rehabilitation Services Pager [phone] Office [phone]    Syrah Daughtrey 10/27/2021, 4:05 PM

## 2021-10-27 NOTE — Progress Notes (Signed)
Subjective: 1 Day Post-Op Procedure(s) (LRB): Left TOTAL HIP ARTHROPLASTY ANTERIOR APPROACH (Left) Patient reports pain as moderate.    Objective: Vital signs in last 24 hours: Temp:  [97.5 F (36.4 C)-99.3 F (37.4 C)] 99.3 F (37.4 C) (01/07 0958) Pulse Rate:  [56-94] 79 (01/07 0958) Resp:  [12-20] 15 (01/07 0958) BP: (113-142)/(72-96) 142/89 (01/07 0958) SpO2:  [94 %-97 %] 95 % (01/07 0958)  Intake/Output from previous day: 01/06 0701 - 01/07 0700 In: 2528.1 [P.O.:470; I.V.:2058.1] Out: 1225 [Urine:1025; Blood:200] Intake/Output this shift: No intake/output data recorded.  Recent Labs    10/27/21 0308  HGB 10.6*   Recent Labs    10/27/21 0308  WBC 13.0*  RBC 3.40*  HCT 32.9*  PLT 193   Recent Labs    10/27/21 0308  NA 134*  K 3.7  CL 101  CO2 27  BUN 17  CREATININE 0.85  GLUCOSE 110*  CALCIUM 8.3*   No results for input(s): LABPT, INR in the last 72 hours.  Sensation intact distally Intact pulses distally Dorsiflexion/Plantar flexion intact Incision: scant drainage   Assessment/Plan: 1 Day Post-Op Procedure(s) (LRB): Left TOTAL HIP ARTHROPLASTY ANTERIOR APPROACH (Left) Up with therapy  He would like to try to go home this afternoon.  This will depend on how much progress he makes with therapy and if he is safe versus waiting until tomorrow.  I will put in discharge orders for this afternoon and nursing can change that if it is felt he needs to stay.    Aaron Gonzalez 10/27/2021, 10:12 AM

## 2021-10-27 NOTE — Plan of Care (Signed)

## 2021-10-27 NOTE — Progress Notes (Signed)
Provided discharge education/instructions, all questions and concerns addressed. Pt complaining of mild pain to L hip 2/10, not in acute distress, stating he is ready to go home. Per Pt, he has a walker at home. Pt discharged home with belongings accompanied by family.

## 2021-10-27 NOTE — Progress Notes (Signed)
Physical Therapy Treatment Patient Details Name: Aaron Gonzalez MRN: [id number] DOB: 28-Nov-1945 Today's Date: 10/27/2021   History of Present Illness Pt s/p L THR and with hx of R THR, CLL, CVA (pt states minimal residual deficits), and rectal CA    PT Comments    Pt very cooperative and progressing with mobility but continues pain limited.    Recommendations for follow up therapy are one component of a multi-disciplinary discharge planning process, led by the attending physician.  Recommendations may be updated based on patient status, additional functional criteria and insurance authorization.  Follow Up Recommendations  Follow physician's recommendations for discharge plan and follow up therapies     Assistance Recommended at Discharge Frequent or constant Supervision/Assistance  Patient can return home with the following     Equipment Recommendations  None recommended by PT    Recommendations for Other Services       Precautions / Restrictions Precautions Precautions: Fall Restrictions Weight Bearing Restrictions: No     Mobility  Bed Mobility Overal bed mobility: Needs Assistance Bed Mobility: Supine to Sit     Supine to sit: Min assist     General bed mobility comments: Increased time with steady assist    Transfers Overall transfer level: Needs assistance   Transfers: Sit to/from Stand Sit to Stand: Min assist           General transfer comment: cues for LE management and use of UEs to self assist    Ambulation/Gait Ambulation/Gait assistance: Min assist;Min guard Gait Distance (Feet): 60 Feet Assistive device: Rolling walker (2 wheels) Gait Pattern/deviations: Step-to pattern;Step-through pattern;Decreased step length - right;Decreased step length - left;Shuffle;Trunk flexed Gait velocity: decr     General Gait Details: cues for posture, position from RW and initial sequence   Stairs             Wheelchair Mobility    Modified  Rankin (Stroke Patients Only)       Balance Overall balance assessment: Needs assistance Sitting-balance support: No upper extremity supported;Feet supported Sitting balance-Leahy Scale: Good     Standing balance support: Bilateral upper extremity supported Standing balance-Leahy Scale: Poor                              Cognition Arousal/Alertness: Awake/alert Behavior During Therapy: WFL for tasks assessed/performed Overall Cognitive Status: Within Functional Limits for tasks assessed                                 General Comments: Some delayed processing with cues and questions.  Apparent memory deficits with questioning about family and home.        Exercises Total Joint Exercises Ankle Circles/Pumps: AROM;Both;15 reps;Supine Quad Sets: AROM;Both;10 reps;Supine Heel Slides: AAROM;Left;20 reps;Supine Hip ABduction/ADduction: AAROM;Left;15 reps;Supine    General Comments        Pertinent Vitals/Pain Pain Assessment: 0-10 Pain Score: 7  Pain Location: L hip Pain Descriptors / Indicators: Aching;Sore Pain Intervention(s): Limited activity within patient's tolerance;Monitored during session;Premedicated before session;Ice applied    Home Living                          Prior Function            PT Goals (current goals can now be found in the care plan section) Acute Rehab PT Goals Patient Stated Goal:  Regain IND PT Goal Formulation: With patient Time For Goal Achievement: 11/09/21 Potential to Achieve Goals: Good Progress towards PT goals: Progressing toward goals    Frequency    7X/week      PT Plan Current plan remains appropriate    Co-evaluation              AM-PAC PT "6 Clicks" Mobility   Outcome Measure  Help needed turning from your back to your side while in a flat bed without using bedrails?: A Little Help needed moving from lying on your back to sitting on the side of a flat bed without using  bedrails?: A Little Help needed moving to and from a bed to a chair (including a wheelchair)?: A Little Help needed standing up from a chair using your arms (e.g., wheelchair or bedside chair)?: A Little Help needed to walk in hospital room?: A Little Help needed climbing 3-5 steps with a railing? : A Lot 6 Click Score: 17    End of Session Equipment Utilized During Treatment: Gait belt Activity Tolerance: Patient tolerated treatment well;Patient limited by pain Patient left: in chair;with call bell/phone within reach;with chair alarm set Nurse Communication: Mobility status PT Visit Diagnosis: Difficulty in walking, not elsewhere classified (R26.2);Unsteadiness on feet (R26.81)     Time: 2527-1292 PT Time Calculation (min) (ACUTE ONLY): 28 min  Charges:  $Gait Training: 8-22 mins $Therapeutic Exercise: 8-22 mins                     [place] Pager [phone] Office [phone]    Leondre Taul 10/27/2021, 8:51 AM

## 2021-10-27 NOTE — TOC Transition Note (Signed)
Transition of Care [facility]) - CM/SW Discharge Note   Patient Details  Name: Aaron Gonzalez MRN: [id number] Date of Birth: 1946/05/19  Transition of Care [facility]) CM/SW Contact:  Ross Ludwig, LCSW Phone Number: 10/27/2021, 12:25 PM   Clinical Narrative:     CSW spoke to patient to find out if he needed a rolling walker or a 3 in 1, per patient he said no he does not.  Patient has been prearranged with [place] for [facility] PT.  [place] signing off please reconsult if social work needs arise.   Final next level of care: [place] Barriers to Discharge: Barriers Resolved   Patient Goals and CMS Choice Patient states their goals for this hospitalization and ongoing recovery are:: To return back home with home health. CMS Medicare.gov Compare Post Acute Care list provided to:: Patient Choice offered to / list presented to : Patient  Discharge Placement                       Discharge Plan and Services                          HH Arranged: PT [facility] Agency: [place] Date [place]: 10/27/21 Time [place]: 4128    Social Determinants of Health (SDOH) Interventions     Readmission Risk Interventions No flowsheet data found.

## 2021-10-27 NOTE — Discharge Instructions (Signed)

## 2021-10-28 NOTE — Discharge Summary (Signed)
Patient ID: Aaron Gonzalez MRN: [id number] DOB/AGE: July 07, 1946 76 y.o.  Admit date: 10/26/2021 Discharge date: 10/28/2021  Admission Diagnoses:  Principal Problem:   Unilateral primary osteoarthritis, left hip Active Problems:   Status post left hip replacement   Discharge Diagnoses:  Same  Past Medical History:  Diagnosis Date   Arthritis    CLL (chronic lymphocytic leukemia) (HCC)    DVT (deep venous thrombosis) (HCC)    History of rectal cancer    Hypertension    Stroke [facility])     Surgeries: Procedure(s): Left TOTAL HIP ARTHROPLASTY ANTERIOR APPROACH on 10/26/2021   Consultants:   Discharged Condition: Improved  Hospital Course: Aaron Gonzalez is an 76 y.o. male who was admitted 10/26/2021 for operative treatment ofUnilateral primary osteoarthritis, left hip. Patient has severe unremitting pain that affects sleep, daily activities, and work/hobbies. After pre-op clearance the patient was taken to the operating room on 10/26/2021 and underwent  Procedure(s): Left [place].    Patient was given perioperative antibiotics:  Anti-infectives (From admission, onward)    Start     Dose/Rate Route Frequency Ordered Stop   10/26/21 1400  ceFAZolin (ANCEF) IVPB 1 g/50 mL premix        1 g 100 mL/hr over 30 Minutes Intravenous Every 6 hours 10/26/21 1038 10/26/21 2059   10/26/21 0600  ceFAZolin (ANCEF) IVPB 2g/100 mL premix        2 g 200 mL/hr over 30 Minutes Intravenous On call to O.R. 10/26/21 0534 10/26/21 1751        Patient was given sequential compression devices, early ambulation, and chemoprophylaxis to prevent DVT.  Patient benefited maximally from hospital stay and there were no complications.    Recent vital signs: No data found.   Recent laboratory studies:  Recent Labs    10/27/21 0308  WBC 13.0*  HGB 10.6*  HCT 32.9*  PLT 193  NA 134*  K 3.7  CL 101  CO2 27  BUN 17  CREATININE 0.85  GLUCOSE 110*  CALCIUM 8.3*      Discharge Medications:   Allergies as of 10/27/2021   No Known Allergies      Medication List     TAKE these medications    allopurinol 300 MG tablet Commonly known as: ZYLOPRIM Take 300 mg by mouth daily.   amLODipine 10 MG tablet Commonly known as: NORVASC Take 1 tablet (10 mg total) by mouth daily.   aspirin 81 MG chewable tablet Chew 1 tablet (81 mg total) by mouth 2 (two) times daily.   blood glucose meter kit and supplies Kit Dispense based on patient and insurance preference. Use up to four times daily as directed.   carvedilol 6.25 MG tablet Commonly known as: COREG Take 1 tablet (6.25 mg total) by mouth 2 (two) times daily.   cloNIDine 0.1 MG tablet Commonly known as: CATAPRES Take 0.1 mg by mouth daily as needed (If  systolic BP >025). Notes to patient: Resume home regimen   enalapril 20 MG tablet Commonly known as: VASOTEC Take 20 mg by mouth 2 (two) times daily.   hydrochlorothiazide 25 MG tablet Commonly known as: HYDRODIURIL Take 1 tablet (25 mg total) by mouth daily.   Imbruvica 420 MG Tabs Generic drug: ibrutinib Take 420 mg by mouth daily. Notes to patient: Resume home regimen   methocarbamol 500 MG tablet Commonly known as: ROBAXIN Take 1 tablet (500 mg total) by mouth every 6 (six) hours as needed for muscle spasms.  Notes to patient: Last dose given 01/07 12:49pm   oxyCODONE 5 MG immediate release tablet Commonly known as: Oxy IR/ROXICODONE Take 1-2 tablets (5-10 mg total) by mouth every 6 (six) hours as needed for moderate pain (pain score 4-6). Notes to patient: Last dose given 01/07 12:49pm   pantoprazole 40 MG tablet Commonly known as: PROTONIX Take 40 mg by mouth daily.   rosuvastatin 20 MG tablet Commonly known as: CRESTOR Take 20 mg by mouth at bedtime.        Diagnostic Studies: DG Pelvis Portable  Result Date: 10/26/2021 CLINICAL DATA:  Post hip surgery EXAM: PORTABLE PELVIS 1-2 VIEWS COMPARISON:  Portable exam  0905 hours compared intraoperative images of 10/26/2021 FINDINGS: Osseous demineralization. Indwelling RIGHT and new LEFT hip prostheses identified. No acute fracture, dislocation, or bone destruction. IMPRESSION: New LEFT hip prosthesis without acute complication. Electronically Signed   By: Lavonia Dana M.D.   On: 10/26/2021 09:40   DG C-Arm 1-60 Min-No Report  Result Date: 10/26/2021 Fluoroscopy was utilized by the requesting physician.  No radiographic interpretation.   DG HIP OPERATIVE UNILAT W OR W/O PELVIS LEFT  Result Date: 10/26/2021 CLINICAL DATA:  Operative film. Anterior approach left hip replacement. EXAM: OPERATIVE left HIP (WITH PELVIS IF PERFORMED) 6 VIEWS TECHNIQUE: Fluoroscopic spot image(s) were submitted for interpretation post-operatively. COMPARISON:  Left hip radiographs 08/09/2021 FINDINGS: Images performed intraoperatively without the presence of a radiologist. Postsurgical changes are seen of prior total right hip arthroplasty, partially visualized. The patient is undergoing total left hip arthroplasty. IMPRESSION: Fluoroscopic images demonstrate the patient undergoing a total left hip arthroplasty. Please refer to intraoperative findings. Electronically Signed   By: Yvonne Kendall   On: 10/26/2021 09:04    Disposition: Discharge disposition: 01-Home or Self Care       Discharge Instructions     Discharge patient   Complete by: As directed    Can discharge to home later this afternoon if clears therapy.  If not, can wait until tomorrow.   Discharge disposition: 01-Home or Self Care   Discharge patient date: 10/27/2021        Follow-up Information     Mcarthur Rossetti, MD Follow up in 2 week(s).   Specialty: Orthopedic Surgery Contact information: [address] [place] [address] [phone]                  Signed: Mcarthur Rossetti 10/28/2021, 5:22 PM

## 2021-10-29 ENCOUNTER — Inpatient Hospital Stay: Payer: Medicare Other

## 2021-10-29 ENCOUNTER — Inpatient Hospital Stay: Payer: Medicare Other | Admitting: Internal Medicine

## 2021-10-29 ENCOUNTER — Encounter (HOSPITAL_COMMUNITY): Payer: Self-pay | Admitting: Orthopaedic Surgery

## 2021-10-29 NOTE — Discharge Summary (Signed)
Patient ID: Aaron Gonzalez MRN: [id number] DOB/AGE: [birth year] 76 y.o.  Admit date: 10/26/2021 Discharge date: 10/22/2021  Admission Diagnoses:  Principal Problem:   Unilateral primary osteoarthritis, left hip Active Problems:   Status post left hip replacement   Discharge Diagnoses:  Same  Past Medical History:  Diagnosis Date   Arthritis    CLL (chronic lymphocytic leukemia) (HCC)    DVT (deep venous thrombosis) (HCC)    History of rectal cancer    Hypertension    Stroke [facility])     Surgeries: Procedure(s): Left TOTAL HIP ARTHROPLASTY ANTERIOR APPROACH on 10/26/2021   Consultants:   Discharged Condition: Improved  Hospital Course: Aaron Gonzalez is an 76 y.o. male who was admitted 10/26/2021 for operative treatment ofUnilateral primary osteoarthritis, left hip. Patient has severe unremitting pain that affects sleep, daily activities, and work/hobbies. After pre-op clearance the patient was taken to the operating room on 10/26/2021 and underwent  Procedure(s): Left [place].    Patient was given perioperative antibiotics:  Anti-infectives (From admission, onward)    Start     Dose/Rate Route Frequency Ordered Stop   10/26/21 1400  ceFAZolin (ANCEF) IVPB 1 g/50 mL premix        1 g 100 mL/hr over 30 Minutes Intravenous Every 6 hours 10/26/21 1038 10/26/21 2059   10/26/21 0600  ceFAZolin (ANCEF) IVPB 2g/100 mL premix        2 g 200 mL/hr over 30 Minutes Intravenous On call to O.R. 10/26/21 0534 10/26/21 5830        Patient was given sequential compression devices, early ambulation, and chemoprophylaxis to prevent DVT.  Patient benefited maximally from hospital stay and there were no complications.    Recent vital signs: No data found.   Recent laboratory studies:  Recent Labs    10/27/21 0308  WBC 13.0*  HGB 10.6*  HCT 32.9*  PLT 193  NA 134*  K 3.7  CL 101  CO2 27  BUN 17  CREATININE 0.85  GLUCOSE 110*  CALCIUM 8.3*      Discharge Medications:   Allergies as of 10/27/2021   No Known Allergies      Medication List     TAKE these medications    allopurinol 300 MG tablet Commonly known as: ZYLOPRIM Take 300 mg by mouth daily.   amLODipine 10 MG tablet Commonly known as: NORVASC Take 1 tablet (10 mg total) by mouth daily.   aspirin 81 MG chewable tablet Chew 1 tablet (81 mg total) by mouth 2 (two) times daily.   blood glucose meter kit and supplies Kit Dispense based on patient and insurance preference. Use up to four times daily as directed.   carvedilol 6.25 MG tablet Commonly known as: COREG Take 1 tablet (6.25 mg total) by mouth 2 (two) times daily.   cloNIDine 0.1 MG tablet Commonly known as: CATAPRES Take 0.1 mg by mouth daily as needed (If  systolic BP >940). Notes to patient: Resume home regimen   enalapril 20 MG tablet Commonly known as: VASOTEC Take 20 mg by mouth 2 (two) times daily.   hydrochlorothiazide 25 MG tablet Commonly known as: HYDRODIURIL Take 1 tablet (25 mg total) by mouth daily.   Imbruvica 420 MG Tabs Generic drug: ibrutinib Take 420 mg by mouth daily. Notes to patient: Resume home regimen   methocarbamol 500 MG tablet Commonly known as: ROBAXIN Take 1 tablet (500 mg total) by mouth every 6 (six) hours as needed for muscle spasms.  Notes to patient: Last dose given 01/07 12:49pm   oxyCODONE 5 MG immediate release tablet Commonly known as: Oxy IR/ROXICODONE Take 1-2 tablets (5-10 mg total) by mouth every 6 (six) hours as needed for moderate pain (pain score 4-6). Notes to patient: Last dose given 01/07 12:49pm   pantoprazole 40 MG tablet Commonly known as: PROTONIX Take 40 mg by mouth daily.   rosuvastatin 20 MG tablet Commonly known as: CRESTOR Take 20 mg by mouth at bedtime.        Diagnostic Studies: DG Pelvis Portable  Result Date: 10/26/2021 CLINICAL DATA:  Post hip surgery EXAM: PORTABLE PELVIS 1-2 VIEWS COMPARISON:  Portable exam  0905 hours compared intraoperative images of 10/26/2021 FINDINGS: Osseous demineralization. Indwelling RIGHT and new LEFT hip prostheses identified. No acute fracture, dislocation, or bone destruction. IMPRESSION: New LEFT hip prosthesis without acute complication. Electronically Signed   By: Lavonia Dana M.D.   On: 10/26/2021 09:40   DG C-Arm 1-60 Min-No Report  Result Date: 10/26/2021 Fluoroscopy was utilized by the requesting physician.  No radiographic interpretation.   DG HIP OPERATIVE UNILAT W OR W/O PELVIS LEFT  Result Date: 10/26/2021 CLINICAL DATA:  Operative film. Anterior approach left hip replacement. EXAM: OPERATIVE left HIP (WITH PELVIS IF PERFORMED) 6 VIEWS TECHNIQUE: Fluoroscopic spot image(s) were submitted for interpretation post-operatively. COMPARISON:  Left hip radiographs 08/09/2021 FINDINGS: Images performed intraoperatively without the presence of a radiologist. Postsurgical changes are seen of prior total right hip arthroplasty, partially visualized. The patient is undergoing total left hip arthroplasty. IMPRESSION: Fluoroscopic images demonstrate the patient undergoing a total left hip arthroplasty. Please refer to intraoperative findings. Electronically Signed   By: Yvonne Kendall   On: 10/26/2021 09:04    Disposition: Discharge disposition: 01-Home or Self Care       Discharge Instructions     Discharge patient   Complete by: As directed    Can discharge to home later this afternoon if clears therapy.  If not, can wait until tomorrow.   Discharge disposition: 01-Home or Self Care   Discharge patient date: 10/27/2021        Follow-up Information     Mcarthur Rossetti, MD Follow up in 2 week(s).   Specialty: Orthopedic Surgery Contact information: [address] [place] [address] [phone]                  Signed: Mcarthur Rossetti 10/29/2021, 8:23 AM

## 2021-10-31 ENCOUNTER — Telehealth: Payer: Self-pay

## 2021-10-31 ENCOUNTER — Telehealth: Payer: Self-pay | Admitting: Orthopaedic Surgery

## 2021-10-31 NOTE — Telephone Encounter (Signed)
FYI

## 2021-10-31 NOTE — Telephone Encounter (Signed)
Fax received from [facility] advising they have called the pt multiple times to fill his Imbruvica.  I have called the pt at his listed home/cell number and spoken with him. I have advised of this and he states he will call [employer] to schedule delivery.

## 2021-10-31 NOTE — Telephone Encounter (Signed)
Aaron Gonzalez with [facility] called wanting to make Dr.Blackman aware the pt slid out of bed on 10/28/21 but reported no injuries. Aaron Gonzalez states the pt has been doing well in PT and if Dr.Blackman has any questions he can give him a call.   [phone]

## 2021-11-08 ENCOUNTER — Other Ambulatory Visit: Payer: Self-pay

## 2021-11-08 ENCOUNTER — Ambulatory Visit (INDEPENDENT_AMBULATORY_CARE_PROVIDER_SITE_OTHER): Payer: Medicare Other | Admitting: Orthopaedic Surgery

## 2021-11-08 ENCOUNTER — Encounter: Payer: Self-pay | Admitting: Orthopaedic Surgery

## 2021-11-08 DIAGNOSIS — Z96642 Presence of left artificial hip joint: Secondary | ICD-10-CM

## 2021-11-08 NOTE — Progress Notes (Signed)
The patient is 2 weeks status post a left total hip arthroplasty.  He says he is doing well.  He is ambulating with a walker.  He is 76 years old.  Left hip incision looks good.  The staples have been removed and Steri-Strips applied.  There is a hematoma but no seroma.  His leg lengths are equal.  He has been on a baby aspirin twice a day.  At this point he will continue to increase his activities as comfort allows.  He can stop his aspirin from my standpoint.  I would like to see him back in 4 weeks to make sure he is doing well but no x-rays are needed.  All questions and concerns were answered and addressed.

## 2021-11-16 ENCOUNTER — Other Ambulatory Visit: Payer: Self-pay | Admitting: Orthopaedic Surgery

## 2021-11-27 ENCOUNTER — Other Ambulatory Visit: Payer: Self-pay

## 2021-11-27 ENCOUNTER — Inpatient Hospital Stay: Payer: Medicare Other | Attending: Internal Medicine

## 2021-11-27 ENCOUNTER — Inpatient Hospital Stay: Payer: Medicare Other | Admitting: Internal Medicine

## 2021-11-27 VITALS — BP 131/90 | HR 75 | Temp 97.4°F | Resp 16 | Wt 159.1 lb

## 2021-11-27 DIAGNOSIS — C9110 Chronic lymphocytic leukemia of B-cell type not having achieved remission: Secondary | ICD-10-CM | POA: Insufficient documentation

## 2021-11-27 DIAGNOSIS — Z9221 Personal history of antineoplastic chemotherapy: Secondary | ICD-10-CM | POA: Insufficient documentation

## 2021-11-27 DIAGNOSIS — Z923 Personal history of irradiation: Secondary | ICD-10-CM | POA: Insufficient documentation

## 2021-11-27 DIAGNOSIS — Z85048 Personal history of other malignant neoplasm of rectum, rectosigmoid junction, and anus: Secondary | ICD-10-CM | POA: Diagnosis not present

## 2021-11-27 DIAGNOSIS — E876 Hypokalemia: Secondary | ICD-10-CM | POA: Diagnosis not present

## 2021-11-27 DIAGNOSIS — Z79899 Other long term (current) drug therapy: Secondary | ICD-10-CM | POA: Insufficient documentation

## 2021-11-27 DIAGNOSIS — R5383 Other fatigue: Secondary | ICD-10-CM | POA: Diagnosis not present

## 2021-11-27 DIAGNOSIS — Z5111 Encounter for antineoplastic chemotherapy: Secondary | ICD-10-CM

## 2021-11-27 LAB — CBC WITH DIFFERENTIAL (CANCER CENTER ONLY)
Abs Immature Granulocytes: 0.02 10*3/uL (ref 0.00–0.07)
Basophils Absolute: 0.1 10*3/uL (ref 0.0–0.1)
Basophils Relative: 1 %
Eosinophils Absolute: 0.2 10*3/uL (ref 0.0–0.5)
Eosinophils Relative: 2 %
HCT: 37.3 % — ABNORMAL LOW (ref 39.0–52.0)
Hemoglobin: 11.9 g/dL — ABNORMAL LOW (ref 13.0–17.0)
Immature Granulocytes: 0 %
Lymphocytes Relative: 25 %
Lymphs Abs: 2.1 10*3/uL (ref 0.7–4.0)
MCH: 30.6 pg (ref 26.0–34.0)
MCHC: 31.9 g/dL (ref 30.0–36.0)
MCV: 95.9 fL (ref 80.0–100.0)
Monocytes Absolute: 1.1 10*3/uL — ABNORMAL HIGH (ref 0.1–1.0)
Monocytes Relative: 13 %
Neutro Abs: 5 10*3/uL (ref 1.7–7.7)
Neutrophils Relative %: 59 %
Platelet Count: 206 10*3/uL (ref 150–400)
RBC: 3.89 MIL/uL — ABNORMAL LOW (ref 4.22–5.81)
RDW: 15.8 % — ABNORMAL HIGH (ref 11.5–15.5)
WBC Count: 8.4 10*3/uL (ref 4.0–10.5)
nRBC: 0 % (ref 0.0–0.2)

## 2021-11-27 LAB — CMP (CANCER CENTER ONLY)
ALT: 13 U/L (ref 0–44)
AST: 18 U/L (ref 15–41)
Albumin: 4 g/dL (ref 3.5–5.0)
Alkaline Phosphatase: 64 U/L (ref 38–126)
Anion gap: 7 (ref 5–15)
BUN: 13 mg/dL (ref 8–23)
CO2: 30 mmol/L (ref 22–32)
Calcium: 9.1 mg/dL (ref 8.9–10.3)
Chloride: 104 mmol/L (ref 98–111)
Creatinine: 0.92 mg/dL (ref 0.61–1.24)
GFR, Estimated: >60 mL/min (ref >60)
Glucose, Bld: 80 mg/dL (ref 70–99)
Potassium: 3.3 mmol/L — ABNORMAL LOW (ref 3.5–5.1)
Sodium: 141 mmol/L (ref 135–145)
Total Bilirubin: 0.9 mg/dL (ref 0.3–1.2)
Total Protein: 7.1 g/dL (ref 6.5–8.1)

## 2021-11-27 LAB — LACTATE DEHYDROGENASE: LDH: 167 U/L (ref 98–192)

## 2021-11-27 NOTE — Progress Notes (Signed)
[facility] Telephone:([phone]) [phone]   Fax:([phone]) [phone]  OFFICE PROGRESS NOTE  [name], MD [place] [address]  DIAGNOSIS:  1) history of rectal cancer in 2007 status post concurrent chemoradiation followed by surgery. 2) chronic lymphocytic leukemia diagnosed in 2007.  PRIOR THERAPY: None  CURRENT THERAPY: Ibrutinib 420 mg p.o. daily.  INTERVAL HISTORY: Aaron Gonzalez 76 y.o. male returns to the clinic today for follow-up visit accompanied by his sister.  The patient is feeling fine today with no concerning complaints except for mild fatigue.  He denied having any current chest pain, shortness of breath, cough or hemoptysis.  He denied having any fever or chills.  He has no nausea, vomiting, diarrhea or constipation.  He has no headache or visual changes.  He gained few pounds since his last visit.  He continues to tolerate his treatment with ibrutinib fairly well.  The patient is here today for evaluation and repeat blood work.  MEDICAL HISTORY: Past Medical History:  Diagnosis Date   Arthritis    CLL (chronic lymphocytic leukemia) ([facility])    DVT (deep venous thrombosis) (HCC)    History of rectal cancer    Hypertension    Stroke ([facility])     ALLERGIES:  has No Known Allergies.  MEDICATIONS:  Current Outpatient Medications  Medication Sig Dispense Refill   allopurinol (ZYLOPRIM) 300 MG tablet Take 300 mg by mouth daily.     amLODipine (NORVASC) 10 MG tablet Take 1 tablet (10 mg total) by mouth daily. 30 tablet 1   aspirin 81 MG chewable tablet Chew 1 tablet (81 mg total) by mouth 2 (two) times daily. 30 tablet 0   blood glucose meter kit and supplies KIT Dispense based on patient and insurance preference. Use up to four times daily as directed. 1 each 0   carvedilol (COREG) 6.25 MG tablet Take 1 tablet (6.25 mg total) by mouth 2 (two) times daily. 180 tablet 3   cloNIDine (CATAPRES) 0.1 MG tablet Take 0.1 mg by mouth daily as  needed (If  systolic BP >270).     enalapril (VASOTEC) 20 MG tablet Take 20 mg by mouth 2 (two) times daily.     hydrochlorothiazide (HYDRODIURIL) 25 MG tablet Take 1 tablet (25 mg total) by mouth daily. 90 tablet 3   ibrutinib (IMBRUVICA) 420 MG TABS Take 420 mg by mouth daily. 30 tablet 3   methocarbamol (ROBAXIN) 500 MG tablet TAKE 1 TABLET(500 MG) BY MOUTH EVERY 6 HOURS AS NEEDED FOR MUSCLE SPASMS 30 tablet 0   oxyCODONE (OXY IR/ROXICODONE) 5 MG immediate release tablet Take 1-2 tablets (5-10 mg total) by mouth every 6 (six) hours as needed for moderate pain (pain score 4-6). 30 tablet 0   pantoprazole (PROTONIX) 40 MG tablet Take 40 mg by mouth daily.     rosuvastatin (CRESTOR) 20 MG tablet Take 20 mg by mouth at bedtime.     No current facility-administered medications for this visit.    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   Procedure: Left TOTAL HIP ARTHROPLASTY ANTERIOR APPROACH;  Surgeon: Mcarthur Rossetti, MD;  Location: [facility];  Service: Orthopedics;  Laterality: Left;  RNFA    REVIEW OF SYSTEMS:  A comprehensive review of systems was negative.   PHYSICAL EXAMINATION: General appearance: alert, cooperative, and no distress Head: Normocephalic, without  obvious abnormality, atraumatic Neck: no adenopathy, no JVD, supple, symmetrical, trachea midline, and thyroid not enlarged, symmetric, no tenderness/mass/nodules Lymph nodes: Cervical, supraclavicular, and axillary nodes normal. Resp: clear to auscultation bilaterally Back: symmetric, no curvature. ROM normal. No CVA tenderness. Cardio: regular rate and rhythm, S1, S2 normal, no murmur, click, rub or gallop GI: soft, non-tender; bowel sounds normal; no masses,  no organomegaly Extremities: extremities normal, atraumatic, no cyanosis or edema  ECOG PERFORMANCE STATUS: 1 - Symptomatic but completely ambulatory  Blood  pressure 131/90, pulse 75, temperature (!) 97.4 F (36.3 C), temperature source Tympanic, resp. rate 16, weight 159 lb 1.6 oz (72.2 kg), SpO2 98 %.  LABORATORY DATA: Lab Results  Component Value Date   WBC 8.4 11/27/2021   HGB 11.9 (L) 11/27/2021   HCT 37.3 (L) 11/27/2021   MCV 95.9 11/27/2021   PLT 206 11/27/2021      Chemistry      Component Value Date/Time   NA 141 11/27/2021 1042   NA 139 06/28/2021 0954   K 3.3 (L) 11/27/2021 1042   CL 104 11/27/2021 1042   CO2 30 11/27/2021 1042   BUN 13 11/27/2021 1042   BUN 11 06/28/2021 0954   CREATININE 0.92 11/27/2021 1042      Component Value Date/Time   CALCIUM 9.1 11/27/2021 1042   ALKPHOS 64 11/27/2021 1042   AST 18 11/27/2021 1042   ALT 13 11/27/2021 1042   BILITOT 0.9 11/27/2021 1042       RADIOGRAPHIC STUDIES: No results found.  ASSESSMENT AND PLAN: This is a very pleasant 76 years old African-American male with history of rectal cancer diagnosed in 2007 status post concurrent chemoradiation followed by surgical resection.  At the same time the patient was diagnosed with chronic lymphocytic leukemia and has been in observation initially for several years but started on treatment with ibrutinib 420 mg in 2019 and has been tolerating this treatment well with no concerning adverse effects. The patient denied having any concerning complaints today.  Repeat blood work is unremarkable for any concerning abnormalities. I recommended for the patient to continue his treatment with ibrutinib with the same dose. For the mild hypokalemia, I advised the patient to increase his potassium rich diet. I will see him back for follow-up visit in 3 months with repeat blood work. He was advised to call immediately if he has any other concerning symptoms in the interval. The patient voices understanding of current disease status and treatment options and is in agreement with the current care plan. All questions were answered. The patient  knows to call the clinic with any problems, questions or concerns. We can certainly see the patient much sooner if necessary.  The total time spent in the appointment was 20 minutes.  Disclaimer: This note was dictated with voice recognition software. Similar sounding words can inadvertently be transcribed and may not be corrected upon review.

## 2021-12-06 ENCOUNTER — Other Ambulatory Visit: Payer: Self-pay

## 2021-12-06 ENCOUNTER — Encounter: Payer: Self-pay | Admitting: Orthopaedic Surgery

## 2021-12-06 ENCOUNTER — Ambulatory Visit (INDEPENDENT_AMBULATORY_CARE_PROVIDER_SITE_OTHER): Payer: Medicare Other | Admitting: Orthopaedic Surgery

## 2021-12-06 DIAGNOSIS — Z96642 Presence of left artificial hip joint: Secondary | ICD-10-CM

## 2021-12-06 NOTE — Progress Notes (Signed)
The patient is 6 weeks status post a left total hip arthroplasty.  He is very pleased overall.  He is 76 years old but he is ambulating with a rolling walker and does feel like he needs outpatient physical therapy to work on his balance and coordination and overall mobility and strengthening.  I agree with this based on seeing him.  On examination his left hip is moving more smoothly than before.  He is tolerating things from a pain standpoint and not taking any pain medications at all.  We will see about setting him up for outpatient physical therapy to work on his balance and coordination as well as mobility with gait training and lower extremity strengthening.  I can see him back then in 6 weeks to see how he is doing overall but no x-rays are needed.  All questions and concerns were answered and addressed.

## 2021-12-11 ENCOUNTER — Other Ambulatory Visit: Payer: Self-pay

## 2021-12-11 ENCOUNTER — Ambulatory Visit (INDEPENDENT_AMBULATORY_CARE_PROVIDER_SITE_OTHER): Payer: Medicare Other | Admitting: Rehabilitative and Restorative Service Providers"

## 2021-12-11 ENCOUNTER — Encounter: Payer: Self-pay | Admitting: Rehabilitative and Restorative Service Providers"

## 2021-12-11 DIAGNOSIS — R262 Difficulty in walking, not elsewhere classified: Secondary | ICD-10-CM | POA: Diagnosis not present

## 2021-12-11 DIAGNOSIS — M6281 Muscle weakness (generalized): Secondary | ICD-10-CM

## 2021-12-11 DIAGNOSIS — M25552 Pain in left hip: Secondary | ICD-10-CM | POA: Diagnosis not present

## 2021-12-11 NOTE — Therapy (Signed)
OUTPATIENT PHYSICAL THERAPY LOWER EXTREMITY EVALUATION   Patient Name: Aaron Gonzalez MRN: [id number] DOB:[birth year], 76 y.o., male Today's Date: 12/11/2021   PT End of Session - 12/11/21 1300     Visit Number 1    Number of Visits 20    Date for PT Re-Evaluation 02/19/22    Authorization Type [employer] Medicare    PT Start Time 1309    PT Stop Time 1349    PT Time Calculation (min) 40 min    Activity Tolerance Patient tolerated treatment well    Behavior During Therapy WFL for tasks assessed/performed             Past Medical History:  Diagnosis Date   Arthritis    CLL (chronic lymphocytic leukemia) ([place])    DVT (deep venous thrombosis) ([place])    History of rectal cancer    Hypertension    Stroke [facility])    Past Surgical History:  Procedure Laterality Date   COLECTOMY     with colostomy   TOTAL HIP ARTHROPLASTY Left 10/26/2021   Procedure: Left TOTAL HIP ARTHROPLASTY ANTERIOR APPROACH;  Surgeon: Mcarthur Rossetti, MD;  Location: [facility];  Service: Orthopedics;  Laterality: Left;  RNFA   Patient Active Problem List   Diagnosis Date Noted   Status post left hip replacement 10/26/2021   Unilateral primary osteoarthritis, left hip 08/16/2021   Bilateral primary osteoarthritis of hip 08/09/2021   Hypertension 08/01/2021   CLL (chronic lymphocytic leukemia) ([place]) 04/16/2021   History of stroke 04/16/2021   History of rectal cancer 04/16/2021   Encounter for antineoplastic chemotherapy 96/78/9381   Acute metabolic encephalopathy 01/75/1025    PCP: [name], MD  REFERRING PROVIDER: Mcarthur Rossetti  REFERRING DIAG: E52.778 (ICD-10-CM) - Status post left hip replacement  THERAPY DIAG:  Pain in left hip  Muscle weakness (generalized)  Difficulty in walking, not elsewhere classified  ONSET DATE: Surgery 10/26/2021 (Lt anterior THA)  SUBJECTIVE:   SUBJECTIVE STATEMENT: Patient comes to clinic s/p recent Lt anterior THA.  Pt stated initially it was  rough for several weeks but has improved.  Pt denied trouble sleeping at this time.   Pt indicated onset of dizziness c getting in and out of bed in last week or so.  Not noted any other time.   PERTINENT HISTORY: History of DVT, HTN, stroke, rectal cancer  PAIN:  Are you having pain? Yes NPRS scale: pain at worst last 2 weeks: 4/10.  0/10 at rest today Pain location: hip Pain orientation: Left  PAIN TYPE: surgery Pain description: "persistent achy." Aggravating factors: prolonged standing/walking, lifting leg up Relieving factors: rest  PRECAUTIONS: Anterior hip  WEIGHT BEARING RESTRICTIONS No  FALLS:  Has patient fallen in last 6 months? No, Number of falls: 0   LIVING ENVIRONMENT: Lives with: lives with their family Lives in: House/apartment Stairs: to enter front door 8 stairs, rail on Rt going up stairs Has following equipment at home: FWW, [employer] at home  OCCUPATION: Retired   PLOF: Independent, watch TV, go to senior center for exercise, line dance  PATIENT GOALS Reduce pain   OBJECTIVE:    PATIENT SURVEYS:  12/11/2021: FOTO intake:  55 predicted: 4  COGNITION:  12/11/2021:Overall cognitive status: Within functional limits for tasks assessed     SENSATION: 12/11/2021: Light touch: Appears intact   POSTURE:  12/11/2021: rounded shoulder, increased forward trunk lean, forward head posture  PALPATION: 12/11/2021: mild tenderness anterior/lateral Lt hip grossly (trigger points noted in lateral  hip).   LE AROM/PROM:  A(PROM) Right 12/11/2021 Left 12/11/2021  Hip flexion A 75 (P 90) in supine A 65(P 75) in supine  Hip extension    Hip abduction    Hip adduction    Hip internal rotation PROM in 70 deg knee flexion supine: 25 PROM in 70 deg knee flexion supine:  20  Hip external rotation PROM in 70 deg knee flexion supine:  35 PROM in 70 deg knee flexion supine:  30  Knee flexion    Knee extension    Ankle dorsiflexion    Ankle plantarflexion    Ankle  inversion    Ankle eversion     (Blank rows = not tested)  LE MMT:  MMT Right 12/11/2021 Left 12/11/2021  Hip flexion 5/5 4/5  Hip extension    Hip abduction  2/5  Hip adduction    Hip internal rotation    Hip external rotation    Knee flexion 5/5 5/5  Knee extension 5/5 5/5  Ankle dorsiflexion 5/5 5/5  Ankle plantarflexion    Ankle inversion    Ankle eversion     (Blank rows = not tested)    FUNCTIONAL TESTS:  12/11/2021:   TUG: 31 seconds c FWW  GAIT: 12/11/2021: Distance walked: household distances in clinic < 150 ft Assistive device utilized: Environmental consultant - 2 wheeled - FWW Level of assistance: Modified independence Comments: reduce gait speed, shortened stride bilateral c reduce stance on Lt    TODAY'S TREATMENT: 12/11/2021:   Therex:  HEP instruction/performance c cues for techniques, handout provided.      Supine bridge x 10     SKC Lt 15 sec x 3 c strap assist     Sidelying clam shell Lt leg moving x 10     Sit to stand to sit 20 inch table c UE assist up, no UE slow sitting focus x 5      Nustep lvl 5 6 mins     Additional time required in performance of activity to ensure knowledge.         PATIENT EDUCATION:  12/11/2021: Education details: HEP, POC Person educated: Patient Education method: Consulting civil engineer, Demonstration, Verbal cues, and Handouts Education comprehension: verbalized understanding and returned demonstration   HOME EXERCISE PROGRAM: 12/11/2021: Access Code: [email or web address] URL: [email or web address] Date: 12/11/2021 Prepared by: Scot Jun  Exercises Supine Bridge - 2 x daily - 7 x weekly - 2 sets - 10 reps - 2 hold Single Knee to Chest Stretch (Mirrored) - 2 x daily - 7 x weekly - 1 sets - 3-5 reps - 15 hold Clamshell - 1 x daily - 7 x weekly - 3 sets - 10 reps Sit to Stand - 1 x daily - 7 x weekly - 3 sets - 10 reps   ASSESSMENT:  CLINICAL IMPRESSION: Patient is a 76 y.o. who comes to clinic with complaints of Lt hip pain  s/p recent Lt anterior THA with mobility, strength and movement coordination deficits that impair their ability to perform usual daily and recreational functional activities without increase difficulty/symptoms at this time.  Patient to benefit from skilled PT services to address impairments and limitations to improve to previous level of function without restriction secondary to condition.    OBJECTIVE IMPAIRMENTS Abnormal gait, decreased activity tolerance, decreased balance, decreased coordination, decreased endurance, decreased mobility, difficulty walking, decreased ROM, decreased strength, impaired flexibility, improper body mechanics, and pain.   ACTIVITY LIMITATIONS cleaning, community activity, meal prep, shopping, and senior center activity .  PERSONAL FACTORS history of DVT, HTN, stroke, rectal cancer are also affecting patient's functional outcome.    REHAB POTENTIAL: Good  CLINICAL DECISION MAKING: Stable/uncomplicated  EVALUATION COMPLEXITY: Low   GOALS: Goals reviewed with patient? Yes 12/11/2021 eval:  SHORT TERM GOALS:  STG Name Target Date Goal status  1 Patient will demonstrate independent use of home exercise program to maintain progress from in clinic treatments.  01/01/2022 INITIAL                                 LONG TERM GOALS:   LTG Name Target Date Goal status  1 Patient will demonstrate/report pain at worst less than or equal to 2/10 to facilitate minimal limitation in daily activity secondary to pain symptoms.  02/19/2022 INITIAL  2 Patient will demonstrate independent use of home exercise program to facilitate ability to maintain/progress functional gains from skilled physical therapy services.  02/19/2022 INITIAL  3 Patient will demonstrate FOTO outcome > or = 69 % to indicated reduced disability due to condition. 02/19/2022 INITIAL  4 Patient will demonstrate independent ambulation community distances > 300 ft to facilitate community integration at PLOF.   02/19/2022 INITIAL  5 Patient will demonstrate Lt hip flexion 5/5, abduction > or = 4/5 to facilitate ability to perform usual standing, walking, stairs at PLOF s limitation due to symptoms. 02/19/2022 INITIAL  6 Patient will demonstrate Lt hip AROM gains > or = 10 to facilitate improved mobility for self care, dressing at PLOF.  02/19/2022 INITIAL  7 Patient will demonstrate TUG < 14 seconds to indicate minimal fall risk, performed c LRAD appropriate.   02/19/2022 INITIAL   PLAN: PT FREQUENCY: 2x/week  PT DURATION: 10 weeks  PLANNED INTERVENTIONS: Therapeutic exercises, Therapeutic activity, Neuro Muscular re-education, Balance training, Gait training, Patient/Family education, Joint mobilization, Stair training, DME instructions, Dry Needling, Electrical stimulation, Cryotherapy, Moist heat, Taping, Ultrasound, Ionotophoresis 4mg /ml Dexamethasone, and Manual therapy.  All included unless contraindicated  PLAN FOR NEXT SESSION: Review existing HEP knowledge   Scot Jun, PT, DPT, OCS, ATC 12/11/21  1:57 PM

## 2021-12-13 ENCOUNTER — Ambulatory Visit: Payer: Medicare Other | Admitting: Rehabilitative and Restorative Service Providers"

## 2021-12-13 ENCOUNTER — Other Ambulatory Visit: Payer: Self-pay

## 2021-12-13 ENCOUNTER — Encounter: Payer: Self-pay | Admitting: Rehabilitative and Restorative Service Providers"

## 2021-12-13 DIAGNOSIS — M25552 Pain in left hip: Secondary | ICD-10-CM | POA: Diagnosis not present

## 2021-12-13 DIAGNOSIS — R262 Difficulty in walking, not elsewhere classified: Secondary | ICD-10-CM | POA: Diagnosis not present

## 2021-12-13 DIAGNOSIS — M6281 Muscle weakness (generalized): Secondary | ICD-10-CM

## 2021-12-13 NOTE — Therapy (Signed)
OUTPATIENT PHYSICAL THERAPY TREATMENT NOTE   Patient Name: Aaron Gonzalez MRN: [id number] DOB:1946/06/18, 76 y.o., male Today's Date: 12/13/2021  PCP: [name], MD REFERRING PROVIDER: Mcarthur Rossetti*   PT End of Session - 12/13/21 1115     Visit Number 2    Number of Visits 20    Date for PT Re-Evaluation 02/19/22    Authorization Type [employer] Medicare    PT Start Time 1100    PT Stop Time 1141    PT Time Calculation (min) 41 min    Activity Tolerance Patient tolerated treatment well    Behavior During Therapy WFL for tasks assessed/performed             Past Medical History:  Diagnosis Date   Arthritis    CLL (chronic lymphocytic leukemia) ([place])    DVT (deep venous thrombosis) ([place])    History of rectal cancer    Hypertension    Stroke [facility])    Past Surgical History:  Procedure Laterality Date   COLECTOMY     with colostomy   TOTAL HIP ARTHROPLASTY Left 10/26/2021   Procedure: Left TOTAL HIP ARTHROPLASTY ANTERIOR APPROACH;  Surgeon: Mcarthur Rossetti, MD;  Location: [facility];  Service: Orthopedics;  Laterality: Left;  RNFA   Patient Active Problem List   Diagnosis Date Noted   Status post left hip replacement 10/26/2021   Unilateral primary osteoarthritis, left hip 08/16/2021   Bilateral primary osteoarthritis of hip 08/09/2021   Hypertension 08/01/2021   CLL (chronic lymphocytic leukemia) ([place]) 04/16/2021   History of stroke 04/16/2021   History of rectal cancer 04/16/2021   Encounter for antineoplastic chemotherapy 84/69/6295   Acute metabolic encephalopathy 28/41/3244    REFERRING DIAG: W10.272 (ICD-10-CM) - Status post left hip replacement  ONSET DATE: Surgery 10/26/2021 (Lt anterior THA)  THERAPY DIAG:  Pain in left hip  Muscle weakness (generalized)  Difficulty in walking, not elsewhere classified  PERTINENT HISTORY: History of DVT, HTN, stroke, rectal cancer  PRECAUTIONS: Anterior hip  SUBJECTIVE: Patient indicated no pain  upon arrival. Pt stated no specific complaints in last few days from HEP.   PAIN:  Are you having pain? No NPRS scale:   Pain location: hip Pain orientation: Lt PAIN TYPE: surgery Pain description:  Aggravating factors:  Relieving factors:      OBJECTIVE:      PATIENT SURVEYS:  12/11/2021: FOTO intake:  55 predicted: 69   COGNITION:          12/11/2021:Overall cognitive status: Within functional limits for tasks assessed                            SENSATION: 12/11/2021: Light touch: Appears intact           POSTURE:  12/11/2021: rounded shoulder, increased forward trunk lean, forward head posture   PALPATION: 12/11/2021: mild tenderness anterior/lateral Lt hip grossly (trigger points noted in lateral hip).    LE AROM/PROM:   A(PROM) Right 12/11/2021 Left 12/11/2021  Hip flexion A 75 (P 90) in supine A 65(P 75) in supine  Hip extension      Hip abduction      Hip adduction      Hip internal rotation PROM in 70 deg knee flexion supine: 25 PROM in 70 deg knee flexion supine:  20  Hip external rotation PROM in 70 deg knee flexion supine:  35 PROM in 70 deg knee flexion supine:  30  Knee flexion      Knee extension      Ankle dorsiflexion      Ankle plantarflexion      Ankle inversion      Ankle eversion       (Blank rows = not tested)   LE MMT:   MMT Right 12/11/2021 Left 12/11/2021  Hip flexion 5/5 4/5  Hip extension      Hip abduction   2/5  Hip adduction      Hip internal rotation      Hip external rotation      Knee flexion 5/5 5/5  Knee extension 5/5 5/5  Ankle dorsiflexion 5/5 5/5  Ankle plantarflexion      Ankle inversion      Ankle eversion       (Blank rows = not tested)       FUNCTIONAL TESTS:  12/11/2021:   TUG: 31 seconds c FWW   GAIT: 12/11/2021: Distance walked: household distances in clinic < 150 ft Assistive device utilized: Environmental consultant - 2 wheeled - FWW Level of assistance: Modified independence Comments: reduce gait speed, shortened  stride bilateral c reduce stance on Lt       TODAY'S TREATMENT: 12/13/2021:           Therex:   Supine bridge 2 x 10                                SKC Lt 15 sec x 3                                 supine tband clam shell green band x 20 bilateral                                Sit to stand to sit 20 inch table c UE assist up, no UE slow sitting focus x 5                                 Nustep lvl 5 8.5 mins   Neuro:  Feet together balance 1 min Tandem stance 1 min x 1 bilateral Heel to toe raises alternating for ankle strategy improvements x 10 each way no UE                                    12/11/2021:           Therex:       HEP instruction/performance c cues for techniques, handout provided.                                Supine bridge  x 10                               SKC Lt 15 sec x 3 c strap assist                               Sidelying clam shell Lt leg moving x 10  Sit to stand to sit 20 inch table c UE assist up, no UE slow sitting focus x 10                               Nustep lvl 5 6 mins                               Additional time required in performance of activity to ensure knowledge.                                     PATIENT EDUCATION:  12/11/2021: Education details: HEP, POC Person educated: Patient Education method: Consulting civil engineer, Demonstration, Verbal cues, and Handouts Education comprehension: verbalized understanding and returned demonstration     HOME EXERCISE PROGRAM: 12/11/2021: Access Code: [email or web address] URL: [email or web address] Date: 12/11/2021 Prepared by: Scot Jun   Exercises Supine Bridge - 2 x daily - 7 x weekly - 2 sets - 10 reps - 2 hold Single Knee to Chest Stretch (Mirrored) - 2 x daily - 7 x weekly - 1 sets - 3-5 reps - 15 hold Clamshell - 1 x daily - 7 x weekly - 3 sets - 10 reps Sit to Stand - 1 x daily - 7 x weekly - 3 sets - 10 reps     ASSESSMENT:   CLINICAL IMPRESSION: Good  knowledge of HEP at this time as noted in review in clinic today.  Continued strengthening and mobility gains to help improve progressive mobility ability.  Skilled PT necessary at this time.      OBJECTIVE IMPAIRMENTS Abnormal gait, decreased activity tolerance, decreased balance, decreased coordination, decreased endurance, decreased mobility, difficulty walking, decreased ROM, decreased strength, impaired flexibility, improper body mechanics, and pain.    ACTIVITY LIMITATIONS cleaning, community activity, meal prep, shopping, and senior center activity .    PERSONAL FACTORS history of DVT, HTN, stroke, rectal cancer are also affecting patient's functional outcome.      REHAB POTENTIAL: Good   CLINICAL DECISION MAKING: Stable/uncomplicated   EVALUATION COMPLEXITY: Low     GOALS: Goals reviewed with patient? Yes 12/11/2021 eval:  SHORT TERM GOALS:   STG Name Target Date Goal status  1 Patient will demonstrate independent use of home exercise program to maintain progress from in clinic treatments.  01/01/2022 On going Assessed 12/13/2021                                                          LONG TERM GOALS:    LTG Name Target Date Goal status  1 Patient will demonstrate/report pain at worst less than or equal to 2/10 to facilitate minimal limitation in daily activity secondary to pain symptoms.  02/19/2022 INITIAL  2 Patient will demonstrate independent use of home exercise program to facilitate ability to maintain/progress functional gains from skilled physical therapy services.  02/19/2022 INITIAL  3 Patient will demonstrate FOTO outcome > or = 69 % to indicated reduced disability due to condition. 02/19/2022 INITIAL  4 Patient will demonstrate independent ambulation community distances > 300 ft to facilitate community integration at PLOF.  02/19/2022 INITIAL  5 Patient will demonstrate Lt  hip flexion 5/5, abduction > or = 4/5 to facilitate ability to perform usual standing, walking,  stairs at PLOF s limitation due to symptoms. 02/19/2022 INITIAL  6 Patient will demonstrate Lt hip AROM gains > or = 10 to facilitate improved mobility for self care, dressing at PLOF.  02/19/2022 INITIAL  7 Patient will demonstrate TUG < 14 seconds to indicate minimal fall risk, performed c LRAD appropriate.    02/19/2022 INITIAL    PLAN: PT FREQUENCY: 2x/week   PT DURATION: 10 weeks   PLANNED INTERVENTIONS: Therapeutic exercises, Therapeutic activity, Neuro Muscular re-education, Balance training, Gait training, Patient/Family education, Joint mobilization, Stair training, DME instructions, Dry Needling, Electrical stimulation, Cryotherapy, Moist heat, Taping, Ultrasound, Ionotophoresis 4mg /ml Dexamethasone, and Manual therapy.  All included unless contraindicated   PLAN FOR NEXT SESSION:  Progressive strengthening, static balance intervention progression (compliant surface), maybe leg press.   Scot Jun, PT, DPT, OCS, ATC 12/13/21  11:37 AM

## 2021-12-18 ENCOUNTER — Encounter: Payer: Self-pay | Admitting: Rehabilitative and Restorative Service Providers"

## 2021-12-18 ENCOUNTER — Other Ambulatory Visit: Payer: Self-pay

## 2021-12-18 ENCOUNTER — Ambulatory Visit: Payer: Medicare Other | Admitting: Rehabilitative and Restorative Service Providers"

## 2021-12-18 DIAGNOSIS — M6281 Muscle weakness (generalized): Secondary | ICD-10-CM | POA: Diagnosis not present

## 2021-12-18 DIAGNOSIS — M25552 Pain in left hip: Secondary | ICD-10-CM

## 2021-12-18 DIAGNOSIS — R262 Difficulty in walking, not elsewhere classified: Secondary | ICD-10-CM | POA: Diagnosis not present

## 2021-12-18 NOTE — Therapy (Signed)
OUTPATIENT PHYSICAL THERAPY TREATMENT NOTE   Patient Name: Aaron Gonzalez MRN: [id number] DOB:1946-08-02, 76 y.o., male Today's Date: 12/18/2021  PCP: [name], MD REFERRING PROVIDER: Mcarthur Rossetti*   PT End of Session - 12/18/21 1308     Visit Number 3    Number of Visits 20    Date for PT Re-Evaluation 02/19/22    Authorization Type [employer] Medicare    PT Start Time 1301    PT Stop Time 1340    PT Time Calculation (min) 39 min    Activity Tolerance Patient tolerated treatment well    Behavior During Therapy WFL for tasks assessed/performed              Past Medical History:  Diagnosis Date   Arthritis    CLL (chronic lymphocytic leukemia) ([place])    DVT (deep venous thrombosis) ([place])    History of rectal cancer    Hypertension    Stroke [facility])    Past Surgical History:  Procedure Laterality Date   COLECTOMY     with colostomy   TOTAL HIP ARTHROPLASTY Left 10/26/2021   Procedure: Left TOTAL HIP ARTHROPLASTY ANTERIOR APPROACH;  Surgeon: Mcarthur Rossetti, MD;  Location: [facility];  Service: Orthopedics;  Laterality: Left;  RNFA   Patient Active Problem List   Diagnosis Date Noted   Status post left hip replacement 10/26/2021   Unilateral primary osteoarthritis, left hip 08/16/2021   Bilateral primary osteoarthritis of hip 08/09/2021   Hypertension 08/01/2021   CLL (chronic lymphocytic leukemia) ([place]) 04/16/2021   History of stroke 04/16/2021   History of rectal cancer 04/16/2021   Encounter for antineoplastic chemotherapy 10/93/2355   Acute metabolic encephalopathy 73/22/0254    REFERRING DIAG: Y70.623 (ICD-10-CM) - Status post left hip replacement  ONSET DATE: Surgery 10/26/2021 (Lt anterior THA)  THERAPY DIAG:  Pain in left hip  Muscle weakness (generalized)  Difficulty in walking, not elsewhere classified  PERTINENT HISTORY: History of DVT, HTN, stroke, rectal cancer  PRECAUTIONS: Anterior hip  SUBJECTIVE: Patient indicated no  pain upon arrival today with walking.  Patient indicated he has a little easier time with general things now including bed mobility.   PAIN:  Are you having pain? No NPRS scale:   Pain location: hip Pain orientation: Lt PAIN TYPE: surgery Pain description:  Aggravating factors:  Relieving factors:      OBJECTIVE:      PATIENT SURVEYS:  12/11/2021: FOTO intake:  55 predicted: 69   COGNITION:          12/11/2021:Overall cognitive status: Within functional limits for tasks assessed                            SENSATION: 12/11/2021: Light touch: Appears intact           POSTURE:  12/11/2021: rounded shoulder, increased forward trunk lean, forward head posture   PALPATION: 12/11/2021: mild tenderness anterior/lateral Lt hip grossly (trigger points noted in lateral hip).    LE AROM/PROM:   A(PROM) Right 12/11/2021 Left 12/11/2021  Hip flexion A 75 (P 90) in supine A 65(P 75) in supine  Hip extension      Hip abduction      Hip adduction      Hip internal rotation PROM in 70 deg knee flexion supine: 25 PROM in 70 deg knee flexion supine:  20  Hip external rotation PROM in 70 deg knee flexion supine:  35 PROM  in 70 deg knee flexion supine:  30  Knee flexion      Knee extension      Ankle dorsiflexion      Ankle plantarflexion      Ankle inversion      Ankle eversion       (Blank rows = not tested)   LE MMT:   MMT Right 12/11/2021 Left 12/11/2021 Left  12/18/2021  Hip flexion 5/5 4/5 4+/5  Hip extension       Hip abduction   2/5   Hip adduction       Hip internal rotation       Hip external rotation       Knee flexion 5/5 5/5   Knee extension 5/5 5/5   Ankle dorsiflexion 5/5 5/5   Ankle plantarflexion       Ankle inversion       Ankle eversion        (Blank rows = not tested)       FUNCTIONAL TESTS:  12/11/2021:   TUG: 31 seconds c FWW   GAIT: 12/11/2021: Distance walked: household distances in clinic < 150 ft Assistive device utilized: Environmental consultant - 2 wheeled -  FWW Level of assistance: Modified independence Comments: reduce gait speed, shortened stride bilateral c reduce stance on Lt       TODAY'S TREATMENT: 12/18/2021:           Therex:   Nustep lvl 5 10 mins UE/LE                                 Seated LAQ 2.5 lbs 2 x 10 bilateral                                Sit to stand to sit 20 inch table c UE assist up, no UE slow sitting focus x 5    Standing heel/toe raises c UE assist on walker 20x each                                    Neuro:  Feet together balance on foam 1 min    Small clearance marching on foam c SBA x 15 bilateral Modified Tandem stance 1 min x 1 bilateral on foam Lateral stepping in // bars 10 ft x 3 each way   12/13/2021:           Therex:   Supine bridge 2 x 10                                SKC Lt 15 sec x 3                                 supine tband clam shell green band x 20 bilateral                                Sit to stand to sit 20 inch table c UE assist up, no UE slow sitting focus x 5  Nustep lvl 5 8.5 mins   Neuro:  Feet together balance 1 min Tandem stance 1 min x 1 bilateral Heel to toe raises alternating for ankle strategy improvements x 10 each way no UE                                    12/11/2021:           Therex:       HEP instruction/performance c cues for techniques, handout provided.                                Supine bridge  x 10                               SKC Lt 15 sec x 3 c strap assist                               Sidelying clam shell Lt leg moving x 10                               Sit to stand to sit 20 inch table c UE assist up, no UE slow sitting focus x 10                               Nustep lvl 5 6 mins                               Additional time required in performance of activity to ensure knowledge.                                     PATIENT EDUCATION:  12/11/2021: Education details: HEP, POC Person educated: Patient Education  method: Consulting civil engineer, Demonstration, Verbal cues, and Handouts Education comprehension: verbalized understanding and returned demonstration     HOME EXERCISE PROGRAM: 12/11/2021: Access Code: [email or web address] URL: [email or web address] Date: 12/11/2021 Prepared by: Scot Jun   Exercises Supine Bridge - 2 x daily - 7 x weekly - 2 sets - 10 reps - 2 hold Single Knee to Chest Stretch (Mirrored) - 2 x daily - 7 x weekly - 1 sets - 3-5 reps - 15 hold Clamshell - 1 x daily - 7 x weekly - 3 sets - 10 reps Sit to Stand - 1 x daily - 7 x weekly - 3 sets - 10 reps     ASSESSMENT:   CLINICAL IMPRESSION: Focused primarily on balance and other strengthening intervention and saved existing HEP for home due to knowledge demonstrated in those activity.  Able to progress to compliant surfaces c fair to good control in static activity.  Pt to benefit from continued skilled PT services at this time to promote improved strength and balance for progressive gains independent.      OBJECTIVE IMPAIRMENTS Abnormal gait, decreased activity tolerance, decreased balance, decreased coordination, decreased endurance, decreased mobility, difficulty walking, decreased ROM, decreased strength, impaired flexibility, improper body mechanics, and pain.    ACTIVITY LIMITATIONS cleaning, community  activity, meal prep, shopping, and senior center activity .    PERSONAL FACTORS history of DVT, HTN, stroke, rectal cancer are also affecting patient's functional outcome.      REHAB POTENTIAL: Good   CLINICAL DECISION MAKING: Stable/uncomplicated   EVALUATION COMPLEXITY: Low     GOALS: Goals reviewed with patient? Yes 12/11/2021 eval:  SHORT TERM GOALS:   STG Name Target Date Goal status  1 Patient will demonstrate independent use of home exercise program to maintain progress from in clinic treatments.  01/01/2022 On going Assessed 12/13/2021                                                           LONG TERM GOALS:    LTG Name Target Date Goal status  1 Patient will demonstrate/report pain at worst less than or equal to 2/10 to facilitate minimal limitation in daily activity secondary to pain symptoms.  02/19/2022 INITIAL  2 Patient will demonstrate independent use of home exercise program to facilitate ability to maintain/progress functional gains from skilled physical therapy services.  02/19/2022 INITIAL  3 Patient will demonstrate FOTO outcome > or = 69 % to indicated reduced disability due to condition. 02/19/2022 INITIAL  4 Patient will demonstrate independent ambulation community distances > 300 ft to facilitate community integration at PLOF.  02/19/2022 INITIAL  5 Patient will demonstrate Lt hip flexion 5/5, abduction > or = 4/5 to facilitate ability to perform usual standing, walking, stairs at PLOF s limitation due to symptoms. 02/19/2022 INITIAL  6 Patient will demonstrate Lt hip AROM gains > or = 10 to facilitate improved mobility for self care, dressing at PLOF.  02/19/2022 INITIAL  7 Patient will demonstrate TUG < 14 seconds to indicate minimal fall risk, performed c LRAD appropriate.    02/19/2022 INITIAL    PLAN: PT FREQUENCY: 2x/week   PT DURATION: 10 weeks   PLANNED INTERVENTIONS: Therapeutic exercises, Therapeutic activity, Neuro Muscular re-education, Balance training, Gait training, Patient/Family education, Joint mobilization, Stair training, DME instructions, Dry Needling, Electrical stimulation, Cryotherapy, Moist heat, Taping, Ultrasound, Ionotophoresis 4mg /ml Dexamethasone, and Manual therapy.  All included unless contraindicated   PLAN FOR NEXT SESSION:  Progressive strengthening (leg press), static balance intervention progression (compliant surface), maybe leg press.   Scot Jun, PT, DPT, OCS, ATC 12/18/21  1:38 PM

## 2021-12-20 ENCOUNTER — Encounter: Payer: Self-pay | Admitting: Physical Therapy

## 2021-12-20 ENCOUNTER — Ambulatory Visit: Payer: Medicare Other | Admitting: Physical Therapy

## 2021-12-20 ENCOUNTER — Other Ambulatory Visit: Payer: Self-pay

## 2021-12-20 DIAGNOSIS — R262 Difficulty in walking, not elsewhere classified: Secondary | ICD-10-CM | POA: Diagnosis not present

## 2021-12-20 DIAGNOSIS — M6281 Muscle weakness (generalized): Secondary | ICD-10-CM

## 2021-12-20 DIAGNOSIS — M25552 Pain in left hip: Secondary | ICD-10-CM | POA: Diagnosis not present

## 2021-12-20 NOTE — Therapy (Signed)
OUTPATIENT PHYSICAL THERAPY TREATMENT NOTE   Patient Name: Aaron Gonzalez MRN: [id number] DOB:[birth year], 76 y.o., male Today's Date: 12/20/2021  PCP: Aaron Nova, MD REFERRING PROVIDER: Mcarthur Gonzalez*   PT End of Session - 12/20/21 0911     Visit Number 4    Number of Visits 20    Date for PT Re-Evaluation 02/19/22    Authorization Type [employer] Medicare    PT Start Time 0910    PT Stop Time 1003    PT Time Calculation (min) 53 min    Activity Tolerance Patient tolerated treatment well    Behavior During Therapy WFL for tasks assessed/performed               Past Medical History:  Diagnosis Date   Arthritis    CLL (chronic lymphocytic leukemia) ([place])    DVT (deep venous thrombosis) ([place])    History of rectal cancer    Hypertension    Stroke [facility])    Past Surgical History:  Procedure Laterality Date   COLECTOMY     with colostomy   TOTAL HIP ARTHROPLASTY Left 10/26/2021   Procedure: Left TOTAL HIP ARTHROPLASTY ANTERIOR APPROACH;  Surgeon: Aaron Rossetti, MD;  Location: [facility];  Service: Orthopedics;  Laterality: Left;  RNFA   Patient Active Problem List   Diagnosis Date Noted   Status post left hip replacement 10/26/2021   Unilateral primary osteoarthritis, left hip 08/16/2021   Bilateral primary osteoarthritis of hip 08/09/2021   Hypertension 08/01/2021   CLL (chronic lymphocytic leukemia) ([place]) 04/16/2021   History of stroke 04/16/2021   History of rectal cancer 04/16/2021   Encounter for antineoplastic chemotherapy 40/98/1191   Acute metabolic encephalopathy 47/82/9562    REFERRING DIAG: Z30.865 (ICD-10-CM) - Status post left hip replacement  ONSET DATE: Surgery 10/26/2021 (Lt anterior THA)  THERAPY DIAG:  Pain in left hip  Muscle weakness (generalized)  Difficulty in walking, not elsewhere classified  PERTINENT HISTORY: History of DVT, HTN, stroke, rectal cancer  PRECAUTIONS: Anterior hip  SUBJECTIVE: His exercises are going  well.  He was able to walk across room without device a couple of times of last 2 days. No discomfort or fear of falling.   PAIN:  Are you having pain?  No NPRS scale:  Pain location: hip Pain orientation: Lt PAIN TYPE: surgery Pain description:  Aggravating factors:  Relieving factors:   OBJECTIVE:     PATIENT SURVEYS:  12/11/2021: FOTO intake:  55 predicted: 69                 SENSATION: 12/11/2021: Light touch: Appears intact           POSTURE:  12/11/2021: rounded shoulder, increased forward trunk lean, forward head posture   PALPATION: 12/11/2021: mild tenderness anterior/lateral Lt hip grossly (trigger points noted in lateral hip).    LE AROM/PROM:   A(PROM) Right 12/11/21 Left 12/11/21  Hip flexion A 75 (P 90) in supine A 65(P 75) in supine  Hip extension      Hip abduction      Hip adduction      Hip internal rotation PROM in 70 deg knee flexion supine: 25 PROM in 70 deg knee flexion supine:  20  Hip external rotation PROM in 70 deg knee flexion supine:  35 PROM in 70 deg knee flexion supine:  30  Knee flexion      Knee extension      Ankle dorsiflexion      Ankle  plantarflexion      Ankle inversion      Ankle eversion      (Blank rows = not tested)   LE MMT:   MMT Right 12/11/21 Left 12/11/21 Left  12/18/21  Hip flexion 5/5 4/5 4+/5  Hip extension       Hip abduction   2/5   Hip adduction       Hip internal rotation       Hip external rotation       Knee flexion 5/5 5/5   Knee extension 5/5 5/5   Ankle dorsiflexion 5/5 5/5   Ankle plantarflexion       Ankle inversion       Ankle eversion       (Blank rows = not tested)       FUNCTIONAL TESTS:  12/11/2021:   TUG: 31 seconds c FWW   GAIT: 12/11/2021: Distance walked: household distances in clinic < 150 ft Assistive device utilized: Environmental consultant - 2 wheeled - FWW Level of assistance: Modified independence Comments: reduce gait speed, shortened stride bilateral c reduce stance on Lt       TODAY'S  TREATMENT: 12/20/2021 Therapeutic Exercise:  Nustep seat 11 level 5 with BLEs / BUEs with cues for full range 10 min  Leg Press BLEs 75# 15 reps 2 sets & LLE only 25# 15 reps 1 set with PT cues on technique  Hamstring stretch SLR w/strap DF 30 sec hold 2 reps required PT assist  Heel & Toe raises with light UE support 20 reps.   Sit to/from stand without UE assist from 24" bar stool 15 reps with note hip fatigue last 5 reps.   Neuromuscular Reeducation  Feet together crossways on foam beam 30sec static with supervision; feet 2" apart on beam head turns right/left, up/down & diagonals without UE support 5 reps ea with contact / tactile assist;  LLE on foam beam for stance stabilization ; RLE stepping from behind beam to tap cone anterior with LUE support 10 reps.  Tandem Stance on foam beam 30 sec LLE in front with contact assist & 30 sec with LLE in back with MinA  Sidestepping left & right 10' 3 laps with cues to engage LLE  Gait: Pt  ambulated 30' & 50' without AD with supervision. He slowly improved step length BLEs.   PT cued pt to take full steps bil. When amb with RW. Pt return demo with some reminders.   12/18/2021:  Therex:   Nustep lvl 5 10 mins UE/LE                                 Seated LAQ 2.5 lbs 2 x 10 bilateral                                Sit to stand to sit 20 inch table c UE assist up, no UE slow sitting focus x 5    Standing heel/toe raises c UE assist on walker 20x each                                   Neuro:  Feet together balance on foam 1 min    Small clearance marching on foam c SBA x 15 bilateral Modified Tandem stance 1 min x 1  bilateral on foam Lateral stepping in // bars 10 ft x 3 each way   12/13/2021: Therex:   Supine bridge 2 x 10                                SKC Lt 15 sec x 3                                 supine tband clam shell green band x 20 bilateral                                Sit to stand to sit 20 inch table c UE assist up, no UE slow  sitting focus x 5                                 Nustep lvl 5 8.5 mins  Neuro:  Feet together balance 1 min Tandem stance 1 min x 1 bilateral Heel to toe raises alternating for ankle strategy improvements x 10 each way no UE                                    12/11/2021: Therex:       HEP instruction/performance c cues for techniques, handout provided.                                Supine bridge  x 10                               SKC Lt 15 sec x 3 c strap assist                               Sidelying clam shell Lt leg moving x 10                               Sit to stand to sit 20 inch table c UE assist up, no UE slow sitting focus x 10                               Nustep lvl 5 6 mins                               Additional time required in performance of activity to ensure knowledge.    PATIENT EDUCATION:  12/11/2021: Education details: HEP, POC Person educated: Patient Education method: Consulting civil engineer, Demonstration, Verbal cues, and Handouts Education comprehension: verbalized understanding and returned demonstration    HOME EXERCISE PROGRAM: 12/11/2021: Access Code: [email or web address] URL: [email or web address] Date: 12/11/2021 Prepared by: Scot Jun   Exercises Supine Bridge - 2 x daily - 7 x weekly - 2 sets - 10 reps - 2 hold Single Knee to Chest Stretch (Mirrored) - 2 x daily - 7 x weekly - 1 sets - 3-5 reps - 15  hold Clamshell - 1 x daily - 7 x weekly - 3 sets - 10 reps Sit to Stand - 1 x daily - 7 x weekly - 3 sets - 10 reps    ASSESSMENT:   CLINICAL IMPRESSION: PT initiated some gait without device as he reports trying at home.  He appears to have some gait deviations associated with decreased LLE stance & hip strength.  Pt was able to perform some progressive strength including leg press and balance today.  PT did note hip fatigue by end of session.  Pt continues to benefit from skilled PT to improve function & safety.     OBJECTIVE IMPAIRMENTS Abnormal  gait, decreased activity tolerance, decreased balance, decreased coordination, decreased endurance, decreased mobility, difficulty walking, decreased ROM, decreased strength, impaired flexibility, improper body mechanics, and pain.    ACTIVITY LIMITATIONS cleaning, community activity, meal prep, shopping, and senior center activity .    PERSONAL FACTORS history of DVT, HTN, stroke, rectal cancer are also affecting patient's functional outcome.      REHAB POTENTIAL: Good   CLINICAL DECISION MAKING: Stable/uncomplicated   EVALUATION COMPLEXITY: Low     GOALS: Goals reviewed with patient? Yes 12/11/2021 eval:  SHORT TERM GOALS:   STG Name Target Date Goal status  1 Patient will demonstrate independent use of home exercise program to maintain progress from in clinic treatments.  01/01/2022 On going Assessed 12/13/2021                                                          LONG TERM GOALS:    LTG Name Target Date Goal status  1 Patient will demonstrate/report pain at worst less than or equal to 2/10 to facilitate minimal limitation in daily activity secondary to pain symptoms.  02/19/2022 INITIAL  2 Patient will demonstrate independent use of home exercise program to facilitate ability to maintain/progress functional gains from skilled physical therapy services.  02/19/2022 INITIAL  3 Patient will demonstrate FOTO outcome > or = 69 % to indicated reduced disability due to condition. 02/19/2022 INITIAL  4 Patient will demonstrate independent ambulation community distances > 300 ft to facilitate community integration at PLOF.  02/19/2022 INITIAL  5 Patient will demonstrate Lt hip flexion 5/5, abduction > or = 4/5 to facilitate ability to perform usual standing, walking, stairs at PLOF s limitation due to symptoms. 02/19/2022 INITIAL  6 Patient will demonstrate Lt hip AROM gains > or = 10 to facilitate improved mobility for self care, dressing at PLOF.  02/19/2022 INITIAL  7 Patient will demonstrate  TUG < 14 seconds to indicate minimal fall risk, performed c LRAD appropriate.    02/19/2022 INITIAL    PLAN: PT FREQUENCY: 2x/week   PT DURATION: 10 weeks   PLANNED INTERVENTIONS: Therapeutic exercises, Therapeutic activity, Neuro Muscular re-education, Balance training, Gait training, Patient/Family education, Joint mobilization, Stair training, DME instructions, Dry Needling, Electrical stimulation, Cryotherapy, Moist heat, Taping, Ultrasound, Ionotophoresis 4mg /ml Dexamethasone, and Manual therapy.  All included unless contraindicated   PLAN FOR NEXT SESSION: progress LE strength, range & balance, include some hamstring & gastroc stretches, Check hip range,   Jamey Reas, PT, DPT 12/20/21  9:58 AM

## 2021-12-25 ENCOUNTER — Other Ambulatory Visit: Payer: Self-pay

## 2021-12-25 ENCOUNTER — Encounter: Payer: Self-pay | Admitting: Physical Therapy

## 2021-12-25 ENCOUNTER — Ambulatory Visit: Payer: Medicare Other | Admitting: Physical Therapy

## 2021-12-25 DIAGNOSIS — M25552 Pain in left hip: Secondary | ICD-10-CM | POA: Diagnosis not present

## 2021-12-25 DIAGNOSIS — M6281 Muscle weakness (generalized): Secondary | ICD-10-CM

## 2021-12-25 DIAGNOSIS — R262 Difficulty in walking, not elsewhere classified: Secondary | ICD-10-CM | POA: Diagnosis not present

## 2021-12-25 NOTE — Therapy (Signed)
OUTPATIENT PHYSICAL THERAPY TREATMENT NOTE   Patient Name: Aaron Gonzalez MRN: [id number] DOB:May 03, 1946, 76 y.o., male Today's Date: 12/25/2021  PCP: [name], MD REFERRING PROVIDER: Mcarthur Rossetti   PT End of Session - 12/25/21 1349     Visit Number 5    Number of Visits 20    Date for PT Re-Evaluation 02/19/22    Authorization Type [employer] Medicare    PT Start Time 9937    PT Stop Time 1438    PT Time Calculation (min) 50 min    Activity Tolerance Patient tolerated treatment well    Behavior During Therapy WFL for tasks assessed/performed                Past Medical History:  Diagnosis Date   Arthritis    CLL (chronic lymphocytic leukemia) ([place])    DVT (deep venous thrombosis) ([facility])    History of rectal cancer    Hypertension    Stroke [facility])    Past Surgical History:  Procedure Laterality Date   COLECTOMY     with colostomy   TOTAL HIP ARTHROPLASTY Left 10/26/2021   Procedure: Left TOTAL HIP ARTHROPLASTY ANTERIOR APPROACH;  Surgeon: Mcarthur Rossetti, MD;  Location: [facility];  Service: Orthopedics;  Laterality: Left;  RNFA   Patient Active Problem List   Diagnosis Date Noted   Status post left hip replacement 10/26/2021   Unilateral primary osteoarthritis, left hip 08/16/2021   Bilateral primary osteoarthritis of hip 08/09/2021   Hypertension 08/01/2021   CLL (chronic lymphocytic leukemia) ([place]) 04/16/2021   History of stroke 04/16/2021   History of rectal cancer 04/16/2021   Encounter for antineoplastic chemotherapy 16/96/7893   Acute metabolic encephalopathy 81/10/7508    REFERRING DIAG: C58.527 (ICD-10-CM) - Status post left hip replacement  ONSET DATE: Surgery 10/26/2021 (Lt anterior THA)  THERAPY DIAG:  Pain in left hip  Muscle weakness (generalized)  Difficulty in walking, not elsewhere classified  PERTINENT HISTORY: History of DVT, HTN, stroke, rectal cancer  PRECAUTIONS: Anterior hip  SUBJECTIVE: He is doing his  exercises. Walking in house with cane & no device.   PAIN:  Are you having pain?  No NPRS scale:  Pain location: hip Pain orientation: Lt PAIN TYPE: surgery Pain description:  Aggravating factors:  Relieving factors:   OBJECTIVE:     PATIENT SURVEYS:  12/11/2021: FOTO intake:  55 predicted: 69                 SENSATION: 12/11/2021: Light touch: Appears intact           POSTURE:  12/11/2021: rounded shoulder, increased forward trunk lean, forward head posture   PALPATION: 12/11/2021: mild tenderness anterior/lateral Lt hip grossly (trigger points noted in lateral hip).    LE AROM/PROM:   A(PROM) Right 12/11/21 Left 12/11/21  Hip flexion A 75 (P 90) in supine A 65(P 75) in supine  Hip extension      Hip abduction      Hip adduction      Hip internal rotation PROM in 70 deg knee flexion supine: 25 PROM in 70 deg knee flexion supine:  20  Hip external rotation PROM in 70 deg knee flexion supine:  35 PROM in 70 deg knee flexion supine:  30  Knee flexion      Knee extension      Ankle dorsiflexion      Ankle plantarflexion      Ankle inversion      Ankle eversion      (  Blank rows = not tested)   LE MMT:   MMT Right 12/11/21 Left 12/11/21 Left  12/18/21  Hip flexion 5/5 4/5 4+/5  Hip extension       Hip abduction   2/5   Hip adduction       Hip internal rotation       Hip external rotation       Knee flexion 5/5 5/5   Knee extension 5/5 5/5   Ankle dorsiflexion 5/5 5/5   Ankle plantarflexion       Ankle inversion       Ankle eversion       (Blank rows = not tested)       FUNCTIONAL TESTS:  12/11/2021:   TUG: 31 seconds c FWW   GAIT: 12/11/2021: Distance walked: household distances in clinic < 150 ft Assistive device utilized: Environmental consultant - 2 wheeled - FWW Level of assistance: Modified independence Comments: reduce gait speed, shortened stride bilateral c reduce stance on Lt       TODAY'S TREATMENT: 12/25/2021  Therapeutic Exercise:  Nustep seat 11 level 6 with  BLEs / BUEs with cues for full range 10 min  Leg Press BLEs 75# 15 reps 2 sets & LLE only 31# 15 reps 1 set with PT cues on technique  Hamstring stretch SLR w/strap DF 30 sec hold 2 reps required PT assist  Heel & Toe raises with light UE support 20 reps.   Sit to/from stand without UE assist from 24" bar stool 15 reps with note hip fatigue last 5 reps.   Neuromuscular Reeducation  Feet together crossways on foam beam 30sec static with supervision; feet 2" apart on beam head turns right/left, up/down & diagonals without UE support 5 reps ea with contact / tactile assist;  LLE on foam beam for stance stabilization ; RLE stepping from behind beam to tap cone anterior with LUE support 10 reps.  Tandem Stance on foam beam 30 sec LLE in front & 30 sec with LLE in back with MinA  Sidestepping on foam beam with light UE support left & right 8' 3 laps with cues to engage LLE  Gait: Pt  ambulated 50' X 2 with cane with supervision. He slowly improved step length BLEs.   PT cued pt to take full steps bil. When amb with RW. Pt return demo with some reminders.   12/20/2021 TREATMENT Therapeutic Exercise:  Nustep seat 11 level 5 with BLEs / BUEs with cues for full range 10 min  Leg Press BLEs 75# 15 reps 2 sets & LLE only 25# 15 reps 1 set with PT cues on technique  Hamstring stretch SLR w/strap DF 30 sec hold 2 reps required PT assist  Heel & Toe raises with light UE support 20 reps.   Sit to/from stand without UE assist from 24" bar stool 15 reps with note hip fatigue last 5 reps.   Neuromuscular Reeducation  Feet together crossways on foam beam 30sec static with supervision; feet 2" apart on beam head turns right/left, up/down & diagonals without UE support 5 reps ea with contact / tactile assist;  LLE on foam beam for stance stabilization ; RLE stepping from behind beam to tap cone anterior with LUE support 10 reps.  Tandem Stance on foam beam 30 sec LLE in front with contact assist & 30 sec with LLE  in back with MinA  Sidestepping left & right 10' 3 laps with cues to engage LLE  Gait: Pt  ambulated 30' &  9' without AD with supervision. He slowly improved step length BLEs.   PT cued pt to take full steps bil. When amb with RW. Pt return demo with some reminders.   12/18/2021:TREATMENT  Therex:   Nustep lvl 5 10 mins UE/LE                                 Seated LAQ 2.5 lbs 2 x 10 bilateral                                Sit to stand to sit 20 inch table c UE assist up, no UE slow sitting focus x 5    Standing heel/toe raises c UE assist on walker 20x each                                   Neuro:  Feet together balance on foam 1 min    Small clearance marching on foam c SBA x 15 bilateral Modified Tandem stance 1 min x 1 bilateral on foam Lateral stepping in // bars 10 ft x 3 each way      PATIENT EDUCATION:  12/11/2021: Education details: HEP, POC Person educated: Patient Education method: Consulting civil engineer, Media planner, Verbal cues, and Handouts Education comprehension: verbalized understanding and returned demonstration    HOME EXERCISE PROGRAM: 12/11/2021: Access Code: [email or web address] URL: [email or web address] Date: 12/11/2021 Prepared by: Scot Jun   Exercises Supine Bridge - 2 x daily - 7 x weekly - 2 sets - 10 reps - 2 hold Single Knee to Chest Stretch (Mirrored) - 2 x daily - 7 x weekly - 1 sets - 3-5 reps - 15 hold Clamshell - 1 x daily - 7 x weekly - 3 sets - 10 reps Sit to Stand - 1 x daily - 7 x weekly - 3 sets - 10 reps    ASSESSMENT:   CLINICAL IMPRESSION: Patient improved functional strength & movement of hip with PT activities.  His balance was improved today requiring less PT assist or UE assist for activities.  Pt continues to benefit from skilled PT to improve his mobility & safety.     OBJECTIVE IMPAIRMENTS Abnormal gait, decreased activity tolerance, decreased balance, decreased coordination, decreased endurance, decreased mobility, difficulty  walking, decreased ROM, decreased strength, impaired flexibility, improper body mechanics, and pain.    ACTIVITY LIMITATIONS cleaning, community activity, meal prep, shopping, and senior center activity .    PERSONAL FACTORS history of DVT, HTN, stroke, rectal cancer are also affecting patient's functional outcome.      REHAB POTENTIAL: Good   CLINICAL DECISION MAKING: Stable/uncomplicated   EVALUATION COMPLEXITY: Low     GOALS: Goals reviewed with patient? Yes 12/11/2021 eval:  SHORT TERM GOALS:   STG Name Target Date Goal status  1 Patient will demonstrate independent use of home exercise program to maintain progress from in clinic treatments.  01/01/2022 On going Assessed 12/13/2021                                                          LONG TERM GOALS:    LTG Name Target Date Goal  status  1 Patient will demonstrate/report pain at worst less than or equal to 2/10 to facilitate minimal limitation in daily activity secondary to pain symptoms.  02/19/2022 INITIAL  2 Patient will demonstrate independent use of home exercise program to facilitate ability to maintain/progress functional gains from skilled physical therapy services.  02/19/2022 INITIAL  3 Patient will demonstrate FOTO outcome > or = 69 % to indicated reduced disability due to condition. 02/19/2022 INITIAL  4 Patient will demonstrate independent ambulation community distances > 300 ft to facilitate community integration at PLOF.  02/19/2022 INITIAL  5 Patient will demonstrate Lt hip flexion 5/5, abduction > or = 4/5 to facilitate ability to perform usual standing, walking, stairs at PLOF s limitation due to symptoms. 02/19/2022 INITIAL  6 Patient will demonstrate Lt hip AROM gains > or = 10 to facilitate improved mobility for self care, dressing at PLOF.  02/19/2022 INITIAL  7 Patient will demonstrate TUG < 14 seconds to indicate minimal fall risk, performed c LRAD appropriate.    02/19/2022 INITIAL    PLAN: PT FREQUENCY:  2x/week   PT DURATION: 10 weeks   PLANNED INTERVENTIONS: Therapeutic exercises, Therapeutic activity, Neuro Muscular re-education, Balance training, Gait training, Patient/Family education, Joint mobilization, Stair training, DME instructions, Dry Needling, Electrical stimulation, Cryotherapy, Moist heat, Taping, Ultrasound, Ionotophoresis '4mg'$ /ml Dexamethasone, and Manual therapy.  All included unless contraindicated   PLAN FOR NEXT SESSION: exercises for strength, range & balance, include some hamstring & gastroc stretches,   Jamey Reas, PT, DPT 12/25/21  4:10 PM

## 2021-12-27 ENCOUNTER — Ambulatory Visit: Payer: Medicare Other | Admitting: Rehabilitative and Restorative Service Providers"

## 2021-12-27 ENCOUNTER — Encounter: Payer: Self-pay | Admitting: Rehabilitative and Restorative Service Providers"

## 2021-12-27 ENCOUNTER — Other Ambulatory Visit: Payer: Self-pay

## 2021-12-27 DIAGNOSIS — M6281 Muscle weakness (generalized): Secondary | ICD-10-CM

## 2021-12-27 DIAGNOSIS — R262 Difficulty in walking, not elsewhere classified: Secondary | ICD-10-CM

## 2021-12-27 DIAGNOSIS — M25552 Pain in left hip: Secondary | ICD-10-CM

## 2021-12-27 NOTE — Therapy (Signed)
OUTPATIENT PHYSICAL THERAPY TREATMENT NOTE   Patient Name: Aaron Gonzalez MRN: [id number] DOB:1946/01/09, 76 y.o., male Today's Date: 12/27/2021  PCP: [name], MD REFERRING PROVIDER: Mcarthur Rossetti   PT End of Session - 12/27/21 1310     Visit Number 6    Number of Visits 20    Date for PT Re-Evaluation 02/19/22    Authorization Type [employer] Medicare    PT Start Time 1259    PT Stop Time 1339    PT Time Calculation (min) 40 min    Activity Tolerance Patient tolerated treatment well    Behavior During Therapy WFL for tasks assessed/performed                 Past Medical History:  Diagnosis Date   Arthritis    CLL (chronic lymphocytic leukemia) ([place])    DVT (deep venous thrombosis) ([place])    History of rectal cancer    Hypertension    Stroke [facility])    Past Surgical History:  Procedure Laterality Date   COLECTOMY     with colostomy   TOTAL HIP ARTHROPLASTY Left 10/26/2021   Procedure: Left TOTAL HIP ARTHROPLASTY ANTERIOR APPROACH;  Surgeon: Mcarthur Rossetti, MD;  Location: [facility];  Service: Orthopedics;  Laterality: Left;  RNFA   Patient Active Problem List   Diagnosis Date Noted   Status post left hip replacement 10/26/2021   Unilateral primary osteoarthritis, left hip 08/16/2021   Bilateral primary osteoarthritis of hip 08/09/2021   Hypertension 08/01/2021   CLL (chronic lymphocytic leukemia) ([place]) 04/16/2021   History of stroke 04/16/2021   History of rectal cancer 04/16/2021   Encounter for antineoplastic chemotherapy 62/26/3335   Acute metabolic encephalopathy 45/62/5638    REFERRING DIAG: L37.342 (ICD-10-CM) - Status post left hip replacement  ONSET DATE: Surgery 10/26/2021 (Lt anterior THA)  THERAPY DIAG:  Pain in left hip  Muscle weakness (generalized)  Difficulty in walking, not elsewhere classified  PERTINENT HISTORY: History of DVT, HTN, stroke, rectal cancer  PRECAUTIONS: Anterior hip  SUBJECTIVE: Pt indicated no pain  complaints today.   PAIN:  Are you having pain?  No NPRS scale:  Pain location: hip Pain orientation: Lt PAIN TYPE: surgery Pain description:  Aggravating factors:  Relieving factors:   OBJECTIVE:     PATIENT SURVEYS:  12/11/2021: FOTO intake:  55 predicted: 69                 SENSATION: 12/11/2021: Light touch: Appears intact           POSTURE:  12/11/2021: rounded shoulder, increased forward trunk lean, forward head posture   PALPATION: 12/11/2021: mild tenderness anterior/lateral Lt hip grossly (trigger points noted in lateral hip).    LE AROM/PROM:   A(PROM) Right 12/11/21 Left 12/11/21  Hip flexion A 75 (P 90) in supine A 65(P 75) in supine  Hip extension      Hip abduction      Hip adduction      Hip internal rotation PROM in 70 deg knee flexion supine: 25 PROM in 70 deg knee flexion supine:  20  Hip external rotation PROM in 70 deg knee flexion supine:  35 PROM in 70 deg knee flexion supine:  30  Knee flexion      Knee extension      Ankle dorsiflexion      Ankle plantarflexion      Ankle inversion      Ankle eversion      (Blank  rows = not tested)   LE MMT:   MMT Right 12/11/21 Left 12/11/21 Left  12/18/21  Hip flexion 5/5 4/5 4+/5  Hip extension       Hip abduction   2/5   Hip adduction       Hip internal rotation       Hip external rotation       Knee flexion 5/5 5/5   Knee extension 5/5 5/5   Ankle dorsiflexion 5/5 5/5   Ankle plantarflexion       Ankle inversion       Ankle eversion       (Blank rows = not tested)       FUNCTIONAL TESTS:  12/27/2021:  TUG:  22.17 seconds c FWW   27.9 seconds c SPC  12/11/2021:   TUG: 31 seconds c FWW   GAIT: 12/11/2021: Distance walked: household distances in clinic < 150 ft Assistive device utilized: Environmental consultant - 2 wheeled - FWW Level of assistance: Modified independence Comments: reduce gait speed, shortened stride bilateral c reduce stance on Lt       TODAY'S TREATMENT: 12/27/2021  Therapeutic  Exercise:  Nustep seat 12 level 6 with BLEs / BUEs with cues for full range 12 min  Sit to/from stand without UE assist from 24" bar stool 10x TUG x 2 (once with FWW, once c SPC)   Neuromuscular Reeducation  Heel & Toe raises with light UE support 20 reps/ SBA for ankle strategy balance control   Feet together eyes closed on foam c SBA 1 min  Marching on foam x 15 bilateral light HHA on counter  Modified tandem stance on foam 1 min x 2 bilateral c SBA/occasional min A  Sidestepping on foam beam with light UE support left & right 8' x 3 each way     12/25/2021  Therapeutic Exercise:  Nustep seat 11 level 6 with BLEs / BUEs with cues for full range 10 min  Leg Press BLEs 75# 15 reps 2 sets & LLE only 31# 15 reps 1 set with PT cues on technique  Hamstring stretch SLR w/strap DF 30 sec hold 2 reps required PT assist  Heel & Toe raises with light UE support 20 reps.   Sit to/from stand without UE assist from 24" bar stool 15 reps with note hip fatigue last 5 reps.   Neuromuscular Reeducation  Feet together crossways on foam beam 30sec static with supervision; feet 2" apart on beam head turns right/left, up/down & diagonals without UE support 5 reps ea with contact / tactile assist;  LLE on foam beam for stance stabilization ; RLE stepping from behind beam to tap cone anterior with LUE support 10 reps.  Tandem Stance on foam beam 30 sec LLE in front & 30 sec with LLE in back with MinA  Sidestepping on foam beam with light UE support left & right 8' 3 laps with cues to engage LLE  Gait: Pt  ambulated 50' X 2 with cane with supervision. He slowly improved step length BLEs.   PT cued pt to take full steps bil. When amb with RW. Pt return demo with some reminders.   12/20/2021 TREATMENT Therapeutic Exercise:  Nustep seat 11 level 5 with BLEs / BUEs with cues for full range 10 min  Leg Press BLEs 75# 15 reps 2 sets & LLE only 25# 15 reps 1 set with PT cues on technique  Hamstring stretch SLR  w/strap DF 30 sec hold 2 reps required  PT assist  Heel & Toe raises with light UE support 20 reps.   Sit to/from stand without UE assist from 24" bar stool 15 reps with note hip fatigue last 5 reps.   Neuromuscular Reeducation  Feet together crossways on foam beam 30sec static with supervision; feet 2" apart on beam head turns right/left, up/down & diagonals without UE support 5 reps ea with contact / tactile assist;  LLE on foam beam for stance stabilization ; RLE stepping from behind beam to tap cone anterior with LUE support 10 reps.  Tandem Stance on foam beam 30 sec LLE in front with contact assist & 30 sec with LLE in back with MinA  Sidestepping left & right 10' 3 laps with cues to engage LLE  Gait: Pt  ambulated 30' & 50' without AD with supervision. He slowly improved step length BLEs.   PT cued pt to take full steps bil. When amb with RW. Pt return demo with some reminders.     PATIENT EDUCATION:  12/11/2021: Education details: HEP, POC Person educated: Patient Education method: Consulting civil engineer, Demonstration, Verbal cues, and Handouts Education comprehension: verbalized understanding and returned demonstration    HOME EXERCISE PROGRAM: 12/11/2021: Access Code: [email or web address] URL: [email or web address] Date: 12/11/2021 Prepared by: Scot Jun   Exercises Supine Bridge - 2 x daily - 7 x weekly - 2 sets - 10 reps - 2 hold Single Knee to Chest Stretch (Mirrored) - 2 x daily - 7 x weekly - 1 sets - 3-5 reps - 15 hold Clamshell - 1 x daily - 7 x weekly - 3 sets - 10 reps Sit to Stand - 1 x daily - 7 x weekly - 3 sets - 10 reps    ASSESSMENT:   CLINICAL IMPRESSION: Performed ambulation c SPC within clinic today c improved step length quality.  TUG c FWW was improved from evaluation.  Continued progression of balance and strength intervention to continue to help improve Pt towards goals.     OBJECTIVE IMPAIRMENTS Abnormal gait, decreased activity tolerance, decreased  balance, decreased coordination, decreased endurance, decreased mobility, difficulty walking, decreased ROM, decreased strength, impaired flexibility, improper body mechanics, and pain.    ACTIVITY LIMITATIONS cleaning, community activity, meal prep, shopping, and senior center activity .    PERSONAL FACTORS history of DVT, HTN, stroke, rectal cancer are also affecting patient's functional outcome.      REHAB POTENTIAL: Good   CLINICAL DECISION MAKING: Stable/uncomplicated   EVALUATION COMPLEXITY: Low     GOALS: Goals reviewed with patient? Yes 12/11/2021 eval:  SHORT TERM GOALS:   STG Name Target Date Goal status  1 Patient will demonstrate independent use of home exercise program to maintain progress from in clinic treatments.  01/01/2022 On going Assessed 12/13/2021                                                          LONG TERM GOALS:    LTG Name Target Date Goal status  1 Patient will demonstrate/report pain at worst less than or equal to 2/10 to facilitate minimal limitation in daily activity secondary to pain symptoms.  02/19/2022 INITIAL  2 Patient will demonstrate independent use of home exercise program to facilitate ability to maintain/progress functional gains from skilled physical therapy services.  02/19/2022 INITIAL  3 Patient will demonstrate FOTO  outcome > or = 69 % to indicated reduced disability due to condition. 02/19/2022 INITIAL  4 Patient will demonstrate independent ambulation community distances > 300 ft to facilitate community integration at PLOF.  02/19/2022 INITIAL  5 Patient will demonstrate Lt hip flexion 5/5, abduction > or = 4/5 to facilitate ability to perform usual standing, walking, stairs at PLOF s limitation due to symptoms. 02/19/2022 INITIAL  6 Patient will demonstrate Lt hip AROM gains > or = 10 to facilitate improved mobility for self care, dressing at PLOF.  02/19/2022 INITIAL  7 Patient will demonstrate TUG < 14 seconds to indicate minimal fall  risk, performed c LRAD appropriate.    02/19/2022 INITIAL    PLAN: PT FREQUENCY: 2x/week   PT DURATION: 10 weeks   PLANNED INTERVENTIONS: Therapeutic exercises, Therapeutic activity, Neuro Muscular re-education, Balance training, Gait training, Patient/Family education, Joint mobilization, Stair training, DME instructions, Dry Needling, Electrical stimulation, Cryotherapy, Moist heat, Taping, Ultrasound, Ionotophoresis '4mg'$ /ml Dexamethasone, and Manual therapy.  All included unless contraindicated   PLAN FOR NEXT SESSION: Progressive strengthening, static and dynamic balance.  Use of SPC in clinic.   Scot Jun, PT, DPT, OCS, ATC 12/27/21  1:42 PM

## 2022-01-01 ENCOUNTER — Encounter: Payer: Self-pay | Admitting: Physical Therapy

## 2022-01-01 ENCOUNTER — Ambulatory Visit: Payer: Medicare Other | Admitting: Physical Therapy

## 2022-01-01 ENCOUNTER — Other Ambulatory Visit: Payer: Self-pay

## 2022-01-01 DIAGNOSIS — M6281 Muscle weakness (generalized): Secondary | ICD-10-CM

## 2022-01-01 DIAGNOSIS — R262 Difficulty in walking, not elsewhere classified: Secondary | ICD-10-CM | POA: Diagnosis not present

## 2022-01-01 DIAGNOSIS — M25552 Pain in left hip: Secondary | ICD-10-CM

## 2022-01-01 NOTE — Therapy (Signed)
?OUTPATIENT PHYSICAL THERAPY TREATMENT NOTE ? ? ?Patient Name: Aaron Gonzalez ?MRN: [id number] ?DOB:1946/10/17, 76 y.o., male ?Today's Date: 01/01/2022 ? ?PCP: [name], MD ?REFERRING PROVIDER: Mcarthur Rossetti ? ? PT End of Session - 01/01/22 1350   ? ? Visit Number 7   ? Number of Visits 20   ? Date for PT Re-Evaluation 02/19/22   ? Authorization Type [employer] Medicare   ? PT Start Time 1345   ? PT Stop Time 1430   ? PT Time Calculation (min) 45 min   ? Activity Tolerance Patient tolerated treatment well   ? Behavior During Therapy [facility] for tasks assessed/performed   ? ?  ?  ? ?  ? ? ? ? ? ? ? ?Past Medical History:  ?Diagnosis Date  ? Arthritis   ? CLL (chronic lymphocytic leukemia) ([place])   ? DVT (deep venous thrombosis) ([place])   ? History of rectal cancer   ? Hypertension   ? Stroke [facility])   ? ?Past Surgical History:  ?Procedure Laterality Date  ? COLECTOMY    ? with colostomy  ? TOTAL HIP ARTHROPLASTY Left 10/26/2021  ? Procedure: Left TOTAL HIP ARTHROPLASTY ANTERIOR APPROACH;  Surgeon: Mcarthur Rossetti, MD;  Location: [facility];  Service: Orthopedics;  Laterality: Left;  RNFA  ? ?Patient Active Problem List  ? Diagnosis Date Noted  ? Status post left hip replacement 10/26/2021  ? Unilateral primary osteoarthritis, left hip 08/16/2021  ? Bilateral primary osteoarthritis of hip 08/09/2021  ? Hypertension 08/01/2021  ? CLL (chronic lymphocytic leukemia) ([place]) 04/16/2021  ? History of stroke 04/16/2021  ? History of rectal cancer 04/16/2021  ? Encounter for antineoplastic chemotherapy 04/16/2021  ? Acute metabolic encephalopathy 16/10/930  ? ? ?REFERRING DIAG: [id number] (ICD-10-CM) - Status post left hip replacement ? ?ONSET DATE: Surgery 10/26/2021 (Lt anterior THA) ? ?THERAPY DIAG:  ?Pain in left hip ? ?Muscle weakness (generalized) ? ?Difficulty in walking, not elsewhere classified ? ?PERTINENT HISTORY: History of DVT, HTN, stroke, rectal cancer ? ?PRECAUTIONS: Anterior hip ? ?SUBJECTIVE: He has been  walking in home without device and feels stable.  ? ?PAIN:  ?Are you having pain?  No  ?NPRS scale:  ?Pain location: hip ?Pain orientation: Lt ?PAIN TYPE: surgery ?Pain description:  ?Aggravating factors:  ?Relieving factors:  ? ?OBJECTIVE:  ?   ?PATIENT SURVEYS:  ?12/11/2021: FOTO intake:  55 predicted: 69       ?          ?SENSATION: ?12/11/2021: Light touch: Appears intact ?          ?POSTURE:  ?12/11/2021: rounded shoulder, increased forward trunk lean, forward head posture ?  ?PALPATION: ?12/11/2021: mild tenderness anterior/lateral Lt hip grossly (trigger points noted in lateral hip).  ?  ?LE AROM/PROM: ?  ?A(PROM) Right ?12/11/21 Left ?12/11/21  ?Hip flexion A 75 (P 90) in supine A 65(P 75) in supine  ?Hip extension      ?Hip abduction      ?Hip adduction      ?Hip internal rotation PROM in 70 deg knee flexion supine: 25 PROM in 70 deg knee flexion supine:  20  ?Hip external rotation PROM in 70 deg knee flexion supine:  35 PROM in 70 deg knee flexion supine:  30  ?Knee flexion      ?Knee extension      ?Ankle dorsiflexion      ?Ankle plantarflexion      ?Ankle inversion      ?Ankle  eversion      ?(Blank rows = not tested) ?  ?LE MMT: ?  ?MMT Right ?12/11/21 Left ?12/11/21 Left  ?12/18/21  ?Hip flexion 5/5 4/5 4+/5  ?Hip extension       ?Hip abduction   2/5   ?Hip adduction       ?Hip internal rotation       ?Hip external rotation       ?Knee flexion 5/5 5/5   ?Knee extension 5/5 5/5   ?Ankle dorsiflexion 5/5 5/5   ?Ankle plantarflexion       ?Ankle inversion       ?Ankle eversion       ?(Blank rows = not tested) ?  ?  ?  ?FUNCTIONAL TESTS:  ?12/27/2021:  TUG:  22.17 seconds c FWW   27.9 seconds c SPC ? ?12/11/2021:   TUG: 31 seconds c FWW ?  ?GAIT: ?12/11/2021: ?Distance walked: household distances in clinic < 150 ft ?Assistive device utilized: Environmental consultant - 2 wheeled - FWW ?Level of assistance: Modified independence ?Comments: reduce gait speed, shortened stride bilateral c reduce stance on Lt ?  ?  ?  ?TODAY'S  TREATMENT: ?01/01/2022 TREATMENT ?Therapeutic Exercise: ? Nustep seat 12 level 6 with BLEs / BUEs with cues for full range 10 min ? Sit to/from stand without UE assist from 24" bar stool 10x ?Standing with RW support green theraband BLE kicks 4 ways 10 reps ea ?Step up & down 4" step BUE support 10 reps, PT demo & verbal cues on technique.   ? ?Neuromuscular Reeducation ? Heel & Toe raises on foam with intermittent touch //bars 15 reps.   ? Feet together eyes closed on foam c SBA 1 min ? Marching on foam x 15 bilateral light HHA on //bars ? Tandem stance on foam 30sec with intermittent touch //bars ? Sidestepping on foam beam with light UE support left & right 8' x 2 each way ? ?Gait: pt ambulated 50' X 2 without assistive device with supervision & verbal cues on upright trunk, step length and wt shift. ?PT demo & verbal cues on gait with RW at faster pace with longer steps.   ? ?12/27/2021 TREATMENT ?Therapeutic Exercise: ? Nustep seat 12 level 6 with BLEs / BUEs with cues for full range 12 min ? Sit to/from stand without UE assist from 24" bar stool 10x ? TUG x 2 (once with FWW, once c SPC)  ? ?Neuromuscular Reeducation ? Heel & Toe raises with light UE support 20 reps/ SBA for ankle strategy balance control  ? Feet together eyes closed on foam c SBA 1 min ? Marching on foam x 15 bilateral light HHA on counter ? Modified tandem stance on foam 1 min x 2 bilateral c SBA/occasional min A ? Sidestepping on foam beam with light UE support left & right 8' x 3 each way ? ?12/25/2021 TREATMENT ?Therapeutic Exercise: ? Nustep seat 11 level 6 with BLEs / BUEs with cues for full range 10 min ? Leg Press BLEs 75# 15 reps 2 sets & LLE only 31# 15 reps 1 set with PT cues on technique ? Hamstring stretch SLR w/strap DF 30 sec hold 2 reps required PT assist ? Heel & Toe raises with light UE support 20 reps.  ? Sit to/from stand without UE assist from 24" bar stool 15 reps with note hip fatigue last 5 reps.  ? ?Neuromuscular  Reeducation ? Feet together crossways on foam beam 30sec static with supervision; feet 2"  apart on beam head turns right/left, up/down & diagonals without UE support 5 reps ea with contact / tactile assist; ? LLE on foam beam for stance stabilization ; RLE stepping from behind beam to tap cone anterior with LUE support 10 reps. ? Tandem Stance on foam beam 30 sec LLE in front & 30 sec with LLE in back with MinA ? Sidestepping on foam beam with light UE support left & right 8' 3 laps with cues to engage LLE ? ?Gait: Pt  ambulated 50' X 2 with cane with supervision. He slowly improved step length BLEs.  ? PT cued pt to take full steps bil. When amb with RW. Pt return demo with some reminders.  ? ?  ?PATIENT EDUCATION:  ?12/11/2021: ?Education details: HEP, POC ?Person educated: Patient ?Education method: Explanation, Demonstration, Verbal cues, and Handouts ?Education comprehension: verbalized understanding and returned demonstration ?  ? HOME EXERCISE PROGRAM: ?12/11/2021: Access Code: [email or web address] ?URL: [email or web address] ?Date: 12/11/2021 ?Prepared by: Scot Jun ?  ?Exercises ?Supine Bridge - 2 x daily - 7 x weekly - 2 sets - 10 reps - 2 hold ?Single Knee to Chest Stretch (Mirrored) - 2 x daily - 7 x weekly - 1 sets - 3-5 reps - 15 hold ?Clamshell - 1 x daily - 7 x weekly - 3 sets - 10 reps ?Sit to Stand - 1 x daily - 7 x weekly - 3 sets - 10 reps  ?  ?ASSESSMENT: ?  ?CLINICAL IMPRESSION: ?PT progressed standing exercises which he tolerated well. He improved these functional activities with instruction & practice / repetition.  Pt improved balance activities.  He continues to benefit from skilled PT to improve his mobility & safety.  ?   ?OBJECTIVE IMPAIRMENTS Abnormal gait, decreased activity tolerance, decreased balance, decreased coordination, decreased endurance, decreased mobility, difficulty walking, decreased ROM, decreased strength, impaired flexibility, improper body mechanics, and pain.  ?   ?ACTIVITY LIMITATIONS cleaning, community activity, meal prep, shopping, and senior center activity .  ?  ?PERSONAL FACTORS history of DVT, HTN, stroke, rectal cancer are also affecting patient's functional outcome.  ?  ?  ?[facility]

## 2022-01-03 ENCOUNTER — Encounter: Payer: Self-pay | Admitting: Rehabilitative and Restorative Service Providers"

## 2022-01-03 ENCOUNTER — Ambulatory Visit: Payer: Medicare Other | Admitting: Rehabilitative and Restorative Service Providers"

## 2022-01-03 ENCOUNTER — Other Ambulatory Visit: Payer: Self-pay

## 2022-01-03 DIAGNOSIS — M25552 Pain in left hip: Secondary | ICD-10-CM

## 2022-01-03 DIAGNOSIS — M6281 Muscle weakness (generalized): Secondary | ICD-10-CM | POA: Diagnosis not present

## 2022-01-03 DIAGNOSIS — R262 Difficulty in walking, not elsewhere classified: Secondary | ICD-10-CM | POA: Diagnosis not present

## 2022-01-03 NOTE — Therapy (Signed)
?OUTPATIENT PHYSICAL THERAPY TREATMENT NOTE ? ? ?Patient Name: Aaron Gonzalez ?MRN: [id number] ?DOB:07-26-1946, 76 y.o., male ?Today's Date: 01/03/2022 ? ?PCP: [name], MD ?REFERRING PROVIDER: Mcarthur Rossetti ? ? PT End of Session - 01/03/22 1305   ? ? Visit Number 8   ? Number of Visits 20   ? Date for PT Re-Evaluation 02/19/22   ? Authorization Type [employer] Medicare   ? Progress Note Due on Visit 10   ? PT Start Time 1300   ? PT Stop Time 1340   ? PT Time Calculation (min) 40 min   ? Activity Tolerance Patient tolerated treatment well   ? Behavior During Therapy [facility] for tasks assessed/performed   ? ?  ?  ? ?  ? ? ? ? ? ? ? ? ?Past Medical History:  ?Diagnosis Date  ? Arthritis   ? CLL (chronic lymphocytic leukemia) ([place])   ? DVT (deep venous thrombosis) ([place])   ? History of rectal cancer   ? Hypertension   ? Stroke [facility])   ? ?Past Surgical History:  ?Procedure Laterality Date  ? COLECTOMY    ? with colostomy  ? TOTAL HIP ARTHROPLASTY Left 10/26/2021  ? Procedure: Left TOTAL HIP ARTHROPLASTY ANTERIOR APPROACH;  Surgeon: Mcarthur Rossetti, MD;  Location: [facility];  Service: Orthopedics;  Laterality: Left;  RNFA  ? ?Patient Active Problem List  ? Diagnosis Date Noted  ? Status post left hip replacement 10/26/2021  ? Unilateral primary osteoarthritis, left hip 08/16/2021  ? Bilateral primary osteoarthritis of hip 08/09/2021  ? Hypertension 08/01/2021  ? CLL (chronic lymphocytic leukemia) ([place]) 04/16/2021  ? History of stroke 04/16/2021  ? History of rectal cancer 04/16/2021  ? Encounter for antineoplastic chemotherapy 04/16/2021  ? Acute metabolic encephalopathy 74/82/7078  ? ? ?REFERRING DIAG: [id number] (ICD-10-CM) - Status post left hip replacement ? ?ONSET DATE: Surgery 10/26/2021 (Lt anterior THA) ? ?THERAPY DIAG:  ?Pain in left hip ? ?Muscle weakness (generalized) ? ?Difficulty in walking, not elsewhere classified ? ?PERTINENT HISTORY: History of DVT, HTN, stroke, rectal cancer ? ?PRECAUTIONS: Anterior  hip ? ?SUBJECTIVE: Pt indicated no pain to report upon arrival today.  ? ?PAIN:  ?Are you having pain?  No  ?NPRS scale:  ?Pain location: hip ?Pain orientation: Lt ?PAIN TYPE: surgery ?Pain description:  ?Aggravating factors:  ?Relieving factors:  ? ?OBJECTIVE:  ?   ?PATIENT SURVEYS:  ?12/11/2021: FOTO intake:  55 predicted: 69       ?          ?SENSATION: ?12/11/2021: Light touch: Appears intact ?          ?POSTURE:  ?12/11/2021: rounded shoulder, increased forward trunk lean, forward head posture ?  ?PALPATION: ?12/11/2021: mild tenderness anterior/lateral Lt hip grossly (trigger points noted in lateral hip).  ?  ?LE AROM/PROM: ?  ?A(PROM) Right ?12/11/21 Left ?12/11/21  ?Hip flexion A 75 (P 90) in supine A 65(P 75) in supine  ?Hip extension      ?Hip abduction      ?Hip adduction      ?Hip internal rotation PROM in 70 deg knee flexion supine: 25 PROM in 70 deg knee flexion supine:  20  ?Hip external rotation PROM in 70 deg knee flexion supine:  35 PROM in 70 deg knee flexion supine:  30  ?Knee flexion      ?Knee extension      ?Ankle dorsiflexion      ?Ankle plantarflexion      ?  Ankle inversion      ?Ankle eversion      ?(Blank rows = not tested) ?  ?LE MMT: ?  ?MMT Right ?12/11/21 Left ?12/11/21 Left  ?12/18/21 Left ?01/03/2022  ?Hip flexion 5/5 4/5 4+/5 5/5  ?Hip extension        ?Hip abduction   2/5    ?Hip adduction        ?Hip internal rotation        ?Hip external rotation        ?Knee flexion 5/5 5/5    ?Knee extension 5/5 5/5    ?Ankle dorsiflexion 5/5 5/5    ?Ankle plantarflexion        ?Ankle inversion        ?Ankle eversion        ?(Blank rows = not tested) ?  ?  ?  ?FUNCTIONAL TESTS:  ?12/27/2021:  TUG:  22.17 seconds c FWW   27.9 seconds c SPC ? ?12/11/2021:   TUG: 31 seconds c FWW ?  ?GAIT: ?12/11/2021: ?Distance walked: household distances in clinic < 150 ft ?Assistive device utilized: Environmental consultant - 2 wheeled - FWW ?Level of assistance: Modified independence ?Comments: reduce gait speed, shortened stride bilateral c  reduce stance on Lt ?  ?  ?  ?TODAY'S TREATMENT: ? ?01/03/2022  ?Therapeutic Exercise: ? Nustep seat 12 level 6 UE/LE 10 mins ? Sit to/from stand without UE assist from 24" bar stool 5x ?Standing hip abduction, extension 2 x 10 bilateral c hand on bar (cues for home use) ?Step up 6 inch step 2 x 10 bilateral c bilateral light touch on bars during performance.  ? ?Neuromuscular Reeducation ? Heel & Toe raises  on floor 2 x 10  (cues for home use)   ? Tandem ambulation in // bars with hand assist throughout as needed 10 ft x 6 fwd ? Tandem stance on floor 1 min x 1 bilateral (cues for home use) ?  ? ? ? ?01/01/2022  ?Therapeutic Exercise: ? Nustep seat 12 level 6 with BLEs / BUEs with cues for full range 10 min ? Sit to/from stand without UE assist from 24" bar stool 10x ?Standing with RW support green theraband BLE kicks 4 ways 10 reps ea ?Step up & down 4" step BUE support 10 reps, PT demo & verbal cues on technique.   ? ?Neuromuscular Reeducation ? Heel & Toe raises on foam with intermittent touch //bars 15 reps.   ? Feet together eyes closed on foam c SBA 1 min ? Marching on foam x 15 bilateral light HHA on //bars ? Tandem stance on foam 30sec with intermittent touch //bars ? Sidestepping on foam beam with light UE support left & right 8' x 2 each way ? ?Gait: pt ambulated 50' X 2 without assistive device with supervision & verbal cues on upright trunk, step length and wt shift. ?PT demo & verbal cues on gait with RW at faster pace with longer steps.   ? ?12/27/2021  ?Therapeutic Exercise: ? Nustep seat 12 level 6 with BLEs / BUEs with cues for full range 12 min ? Sit to/from stand without UE assist from 24" bar stool 10x ? TUG x 2 (once with FWW, once c SPC)  ? ?Neuromuscular Reeducation ? Heel & Toe raises with light UE support 20 reps/ SBA for ankle strategy balance control  ? Feet together eyes closed on foam c SBA 1 min ? Marching on foam x 15 bilateral light HHA on counter ?  Modified tandem stance on foam 1 min  x 2 bilateral c SBA/occasional min A ? Sidestepping on foam beam with light UE support left & right 8' x 3 each way ? ?12/25/2021  ?Therapeutic Exercise: ? Nustep seat 11 level 6 with BLEs / BUEs with cues for full range 10 min ? Leg Press BLEs 75# 15 reps 2 sets & LLE only 31# 15 reps 1 set with PT cues on technique ? Hamstring stretch SLR w/strap DF 30 sec hold 2 reps required PT assist ? Heel & Toe raises with light UE support 20 reps.  ? Sit to/from stand without UE assist from 24" bar stool 15 reps with note hip fatigue last 5 reps.  ? ?Neuromuscular Reeducation ? Feet together crossways on foam beam 30sec static with supervision; feet 2" apart on beam head turns right/left, up/down & diagonals without UE support 5 reps ea with contact / tactile assist; ? LLE on foam beam for stance stabilization ; RLE stepping from behind beam to tap cone anterior with LUE support 10 reps. ? Tandem Stance on foam beam 30 sec LLE in front & 30 sec with LLE in back with MinA ? Sidestepping on foam beam with light UE support left & right 8' 3 laps with cues to engage LLE ? ?Gait: Pt  ambulated 50' X 2 with cane with supervision. He slowly improved step length BLEs.  ? PT cued pt to take full steps bil. When amb with RW. Pt return demo with some reminders.  ? ?  ?PATIENT EDUCATION:  ?12/11/2021: ?Education details: HEP, POC ?Person educated: Patient ?Education method: Explanation, Demonstration, Verbal cues, and Handouts ?Education comprehension: verbalized understanding and returned demonstration ?  ? HOME EXERCISE PROGRAM: ?12/11/2021: Access Code: [email or web address] ?URL: [email or web address] ?Date: 12/11/2021 ?Prepared by: Scot Jun ?  ?Exercises ?Supine Bridge - 2 x daily - 7 x weekly - 2 sets - 10 reps - 2 hold ?Single Knee to Chest Stretch (Mirrored) - 2 x daily - 7 x weekly - 1 sets - 3-5 reps - 15 hold ?Clamshell - 1 x daily - 7 x weekly - 3 sets - 10 reps ?Sit to Stand - 1 x daily - 7 x weekly - 3 sets - 10 reps  ?   ?ASSESSMENT: ?  ?CLINICAL IMPRESSION: ?Pt to continue to benefit from focus on progressive BLE strengthening and functional balance control improvements.  Left leg strength and control still lacking

## 2022-01-08 ENCOUNTER — Ambulatory Visit: Payer: Medicare Other | Admitting: Rehabilitative and Restorative Service Providers"

## 2022-01-08 ENCOUNTER — Encounter: Payer: Self-pay | Admitting: Rehabilitative and Restorative Service Providers"

## 2022-01-08 ENCOUNTER — Other Ambulatory Visit: Payer: Self-pay

## 2022-01-08 DIAGNOSIS — M6281 Muscle weakness (generalized): Secondary | ICD-10-CM

## 2022-01-08 DIAGNOSIS — R262 Difficulty in walking, not elsewhere classified: Secondary | ICD-10-CM | POA: Diagnosis not present

## 2022-01-08 DIAGNOSIS — M25552 Pain in left hip: Secondary | ICD-10-CM

## 2022-01-08 NOTE — Therapy (Signed)
?OUTPATIENT PHYSICAL THERAPY TREATMENT NOTE ? ? ?Patient Name: Aaron Gonzalez ?MRN: [id number] ?DOB:January 21, 1946, 76 y.o., male ?Today's Date: 01/08/2022 ? ?PCP: [name], MD ?REFERRING PROVIDER: Mcarthur Rossetti ? ? PT End of Session - 01/08/22 1258   ? ? Visit Number 9   ? Number of Visits 20   ? Date for PT Re-Evaluation 02/19/22   ? Authorization Type [employer] Medicare   ? Progress Note Due on Visit 10   ? PT Start Time 1258   ? PT Stop Time 4098   ? PT Time Calculation (min) 40 min   ? Activity Tolerance Patient tolerated treatment well   ? Behavior During Therapy [facility] for tasks assessed/performed   ? ?  ?  ? ?  ? ? ? ? ? ? ? ? ? ?Past Medical History:  ?Diagnosis Date  ? Arthritis   ? CLL (chronic lymphocytic leukemia) ([place])   ? DVT (deep venous thrombosis) ([place])   ? History of rectal cancer   ? Hypertension   ? Stroke [facility])   ? ?Past Surgical History:  ?Procedure Laterality Date  ? COLECTOMY    ? with colostomy  ? TOTAL HIP ARTHROPLASTY Left 10/26/2021  ? Procedure: Left TOTAL HIP ARTHROPLASTY ANTERIOR APPROACH;  Surgeon: Mcarthur Rossetti, MD;  Location: [facility];  Service: Orthopedics;  Laterality: Left;  RNFA  ? ?Patient Active Problem List  ? Diagnosis Date Noted  ? Status post left hip replacement 10/26/2021  ? Unilateral primary osteoarthritis, left hip 08/16/2021  ? Bilateral primary osteoarthritis of hip 08/09/2021  ? Hypertension 08/01/2021  ? CLL (chronic lymphocytic leukemia) ([place]) 04/16/2021  ? History of stroke 04/16/2021  ? History of rectal cancer 04/16/2021  ? Encounter for antineoplastic chemotherapy 04/16/2021  ? Acute metabolic encephalopathy 11/91/4782  ? ? ?REFERRING DIAG: [id number] (ICD-10-CM) - Status post left hip replacement ? ?ONSET DATE: Surgery 10/26/2021 (Lt anterior THA) ? ?THERAPY DIAG:  ?Pain in left hip ? ?Muscle weakness (generalized) ? ?Difficulty in walking, not elsewhere classified ? ?PERTINENT HISTORY: History of DVT, HTN, stroke, rectal cancer ? ?PRECAUTIONS:  Anterior hip ? ?SUBJECTIVE:  Pt indicated no pain continued.  New exercises at home went ok.  ? ?PAIN:  ?Are you having pain?  No  ?NPRS scale:  ?Pain location: hip ?Pain orientation: Lt ?PAIN TYPE: surgery ?Pain description:  ?Aggravating factors:  ?Relieving factors:  ? ?OBJECTIVE:  ?   ?PATIENT SURVEYS:  ?12/11/2021: FOTO intake:  55 predicted: 69       ?          ?SENSATION: ?12/11/2021: Light touch: Appears intact ?          ?POSTURE:  ?12/11/2021: rounded shoulder, increased forward trunk lean, forward head posture ?  ?PALPATION: ?12/11/2021: mild tenderness anterior/lateral Lt hip grossly (trigger points noted in lateral hip).  ?  ?LE AROM/PROM: ?  ?A(PROM) Right ?12/11/21 Left ?12/11/21  ?Hip flexion A 75 (P 90) in supine A 65(P 75) in supine  ?Hip extension      ?Hip abduction      ?Hip adduction      ?Hip internal rotation PROM in 70 deg knee flexion supine: 25 PROM in 70 deg knee flexion supine:  20  ?Hip external rotation PROM in 70 deg knee flexion supine:  35 PROM in 70 deg knee flexion supine:  30  ?Knee flexion      ?Knee extension      ?Ankle dorsiflexion      ?Ankle plantarflexion      ?  Ankle inversion      ?Ankle eversion      ?(Blank rows = not tested) ?  ?LE MMT: ?  ?MMT Right ?12/11/21 Left ?12/11/21 Left  ?12/18/21 Left ?01/03/2022  ?Hip flexion 5/5 4/5 4+/5 5/5  ?Hip extension        ?Hip abduction   2/5    ?Hip adduction        ?Hip internal rotation        ?Hip external rotation        ?Knee flexion 5/5 5/5    ?Knee extension 5/5 5/5    ?Ankle dorsiflexion 5/5 5/5    ?Ankle plantarflexion        ?Ankle inversion        ?Ankle eversion        ?(Blank rows = not tested) ?  ?  ?  ?FUNCTIONAL TESTS:  ?12/27/2021:  TUG:  22.17 seconds c FWW   27.9 seconds c SPC ? ?12/11/2021:   TUG: 31 seconds c FWW ?  ?GAIT: ?12/11/2021: ?Distance walked: household distances in clinic < 150 ft ?Assistive device utilized: Environmental consultant - 2 wheeled - FWW ?Level of assistance: Modified independence ?Comments: reduce gait speed,  shortened stride bilateral c reduce stance on Lt ?  ?  ?  ?TODAY'S TREATMENT: ?01/08/2022  ?Therapeutic Exercise: ? Nustep seat 11 level 6 UE/LE 10 mins ? Seated SLR x 10 bilateral  ?  ?TherActivity: ? Sit to stand to sit slow 20 inch chair x 10 no UE for transfer improvement ? Step on over and down 6 inch step box with single hand rail assist in // bars x 10 with each leg to improve stair navigation ? SPC use in clinic for ambulation to improve confidence in ambulation outside of clinic.  Various household distances performed with supervision.  ? ?Neuromuscular Reeducation ? Heel & Toe raises  on floor 2 x 10   ? Tandem ambulation on foam 8 ft x 10 forward in // bars ? Lateral stepping on foam 8 ft x 3 each way in // bars  ?  ? ? ?01/03/2022  ?Therapeutic Exercise: ? Nustep seat 12 level 6 UE/LE 10 mins ? Sit to/from stand without UE assist from 24" bar stool 5x ?Standing hip abduction, extension 2 x 10 bilateral c hand on bar (cues for home use) ?Step up 6 inch step 2 x 10 bilateral c bilateral light touch on bars during performance.  ? ?Neuromuscular Reeducation ? Heel & Toe raises  on floor 2 x 10  (cues for home use)   ? Tandem ambulation in // bars with hand assist throughout as needed 10 ft x 6 fwd ? Tandem stance on floor 1 min x 1 bilateral (cues for home use) ?  ?01/01/2022  ?Therapeutic Exercise: ? Nustep seat 12 level 6 with BLEs / BUEs with cues for full range 10 min ? Sit to/from stand without UE assist from 24" bar stool 10x ?Standing with RW support green theraband BLE kicks 4 ways 10 reps ea ?Step up & down 4" step BUE support 10 reps, PT demo & verbal cues on technique.   ? ?Neuromuscular Reeducation ? Heel & Toe raises on foam with intermittent touch //bars 15 reps.   ? Feet together eyes closed on foam c SBA 1 min ? Marching on foam x 15 bilateral light HHA on //bars ? Tandem stance on foam 30sec with intermittent touch //bars ? Sidestepping on foam beam with light UE support left & right  8' x 2  each way ? ?Gait: pt ambulated 50' X 2 without assistive device with supervision & verbal cues on upright trunk, step length and wt shift. ?PT demo & verbal cues on gait with RW at faster pace with longer steps.   ? ?12/27/2021  ?Therapeutic Exercise: ? Nustep seat 12 level 6 with BLEs / BUEs with cues for full range 12 min ? Sit to/from stand without UE assist from 24" bar stool 10x ? TUG x 2 (once with FWW, once c SPC)  ? ?Neuromuscular Reeducation ? Heel & Toe raises with light UE support 20 reps/ SBA for ankle strategy balance control  ? Feet together eyes closed on foam c SBA 1 min ? Marching on foam x 15 bilateral light HHA on counter ? Modified tandem stance on foam 1 min x 2 bilateral c SBA/occasional min A ? Sidestepping on foam beam with light UE support left & right 8' x 3 each way ? ? ?  ?PATIENT EDUCATION:  ?12/11/2021: ?Education details: HEP, POC ?Person educated: Patient ?Education method: Explanation, Demonstration, Verbal cues, and Handouts ?Education comprehension: verbalized understanding and returned demonstration ?  ? HOME EXERCISE PROGRAM: ?12/11/2021: Access Code: [email or web address] ?URL: [email or web address] ?Date: 12/11/2021 ?Prepared by: Scot Jun ?  ?Exercises ?Supine Bridge - 2 x daily - 7 x weekly - 2 sets - 10 reps - 2 hold ?Single Knee to Chest Stretch (Mirrored) - 2 x daily - 7 x weekly - 1 sets - 3-5 reps - 15 hold ?Clamshell - 1 x daily - 7 x weekly - 3 sets - 10 reps ?Sit to Stand - 1 x daily - 7 x weekly - 3 sets - 10 reps  ?  ?ASSESSMENT: ?  ?CLINICAL IMPRESSION: ?Improved transfer height performed today without UE assist as noted (20 inch height).  Progressed to include compliant surface balance intervention c fair overall performance.  Continued skilled PT services indicated at this time to continue functional gains and progression.  ?   ?OBJECTIVE IMPAIRMENTS Abnormal gait, decreased activity tolerance, decreased balance, decreased coordination, decreased endurance,  decreased mobility, difficulty walking, decreased ROM, decreased strength, impaired flexibility, improper body mechanics, and pain.  ?  ?ACTIVITY LIMITATIONS cleaning, community activity, meal prep, shopping, and senior

## 2022-01-10 ENCOUNTER — Ambulatory Visit: Payer: Medicare Other | Admitting: Rehabilitative and Restorative Service Providers"

## 2022-01-10 ENCOUNTER — Encounter: Payer: Self-pay | Admitting: Rehabilitative and Restorative Service Providers"

## 2022-01-10 ENCOUNTER — Other Ambulatory Visit: Payer: Self-pay

## 2022-01-10 DIAGNOSIS — R262 Difficulty in walking, not elsewhere classified: Secondary | ICD-10-CM | POA: Diagnosis not present

## 2022-01-10 DIAGNOSIS — M25552 Pain in left hip: Secondary | ICD-10-CM

## 2022-01-10 DIAGNOSIS — M6281 Muscle weakness (generalized): Secondary | ICD-10-CM

## 2022-01-10 NOTE — Therapy (Signed)
?OUTPATIENT PHYSICAL THERAPY TREATMENT NOTE  / PROGRESS NOTE ? ? ?Patient Name: Aaron Gonzalez ?MRN: [id number] ?DOB:[birth year], 76 y.o., male ?Today's Date: 01/10/2022 ? ?PCP: [name], MD ?REFERRING PROVIDER: Mcarthur Rossetti ? ? PT End of Session - 01/10/22 1300   ? ? Visit Number 10   ? Number of Visits 20   ? Date for PT Re-Evaluation 02/19/22   ? Authorization Type [employer] Medicare   ? Progress Note Due on Visit 20   ? PT Start Time 3235   ? PT Stop Time 1336   ? PT Time Calculation (min) 39 min   ? Activity Tolerance Patient tolerated treatment well   ? Behavior During Therapy [facility] for tasks assessed/performed   ? ?  ?  ? ?  ?Progress Note ?Reporting Period 12/11/2021 to 01/10/2022  ? ?See note below for Objective Data and Assessment of Progress/Goals.  ? ? ? ? ?Past Medical History:  ?Diagnosis Date  ? Arthritis   ? CLL (chronic lymphocytic leukemia) ([place])   ? DVT (deep venous thrombosis) ([place])   ? History of rectal cancer   ? Hypertension   ? Stroke [facility])   ? ?Past Surgical History:  ?Procedure Laterality Date  ? COLECTOMY    ? with colostomy  ? TOTAL HIP ARTHROPLASTY Left 10/26/2021  ? Procedure: Left TOTAL HIP ARTHROPLASTY ANTERIOR APPROACH;  Surgeon: Mcarthur Rossetti, MD;  Location: [facility];  Service: Orthopedics;  Laterality: Left;  RNFA  ? ?Patient Active Problem List  ? Diagnosis Date Noted  ? Status post left hip replacement 10/26/2021  ? Unilateral primary osteoarthritis, left hip 08/16/2021  ? Bilateral primary osteoarthritis of hip 08/09/2021  ? Hypertension 08/01/2021  ? CLL (chronic lymphocytic leukemia) ([place]) 04/16/2021  ? History of stroke 04/16/2021  ? History of rectal cancer 04/16/2021  ? Encounter for antineoplastic chemotherapy 04/16/2021  ? Acute metabolic encephalopathy 57/32/2025  ? ? ?REFERRING DIAG: [id number] (ICD-10-CM) - Status post left hip replacement ? ?ONSET DATE: Surgery 10/26/2021 (Lt anterior THA) ? ?THERAPY DIAG:  ?Pain in left hip ? ?Muscle weakness  (generalized) ? ?Difficulty in walking, not elsewhere classified ? ?PERTINENT HISTORY: History of DVT, HTN, stroke, rectal cancer ? ?PRECAUTIONS: Anterior hip ? ?SUBJECTIVE:  Pt indicated global rating of change +6 at this time.   He indicated he is doing better.  Wants to walk with cane better.  ? ?PAIN:  ?Are you having pain?  No  ?NPRS scale:  ?Pain location: hip ?Pain orientation: Lt ?PAIN TYPE: surgery ?Pain description:  ?Aggravating factors:  ?Relieving factors:  ? ?OBJECTIVE:  ?   ?PATIENT SURVEYS:  ?01/10/2022: FOTO update ?12/11/2021: FOTO intake:  55 predicted: 69       ?          ?SENSATION: ?12/11/2021: Light touch: Appears intact ?          ?POSTURE:  ?12/11/2021: rounded shoulder, increased forward trunk lean, forward head posture ?  ?PALPATION: ?12/11/2021: mild tenderness anterior/lateral Lt hip grossly (trigger points noted in lateral hip).  ?  ?LE AROM/PROM: ?  ?A(PROM) Right ?12/11/21 Left ?12/11/21 Left ?01/10/2022  ?Hip flexion A 75 (P 90) in supine A 65(P 75) in supine AROM in supine: ?70  ?Hip extension       ?Hip abduction       ?Hip adduction       ?Hip internal rotation PROM in 70 deg knee flexion supine: 25 PROM in 70 deg knee flexion supine:  20 PROM in  70 deg knee flexion supine: 45  ?Hip external rotation PROM in 70 deg knee flexion supine:  35 PROM in 70 deg knee flexion supine:  30 PROM in 70 deg knee flexion supine: 45  ?Knee flexion       ?Knee extension       ?Ankle dorsiflexion       ?Ankle plantarflexion       ?Ankle inversion       ?Ankle eversion       ?(Blank rows = not tested) ?  ?LE MMT: ?  ?MMT Right ?12/11/21 Left ?12/11/21 Left  ?12/18/21 Left ?01/03/2022 Left ?01/10/2022  ?Hip flexion 5/5 4/5 4+/5 5/5 5/5  ?Hip extension         ?Hip abduction   2/5   3/5  ?Hip adduction         ?Hip internal rotation         ?Hip external rotation         ?Knee flexion 5/5 5/5   5/5  ?Knee extension 5/5 5/5   5/5  ?Ankle dorsiflexion 5/5 5/5   5/5  ?Ankle plantarflexion         ?Ankle inversion          ?Ankle eversion         ?(Blank rows = not tested) ?  ?  ?  ?FUNCTIONAL TESTS:  ?01/10/2022:  TUG:  17 seconds c FWW    25.61 seconds c SPC ? ?12/27/2021:  TUG:  22.17 seconds c FWW   27.9 seconds c SPC ?12/11/2021:   TUG: 31 seconds c FWW ?  ?GAIT: ?01/10/2022:  Able to perform ambulation c SPC in Rt UE within clinic. Reduced gait speed noted c reduced step length bilateral and wider base of support noted.  ? ? ?12/11/2021: ?Distance walked: household distances in clinic < 150 ft ?Assistive device utilized: Environmental consultant - 2 wheeled - FWW ?Level of assistance: Modified independence ?Comments: reduce gait speed, shortened stride bilateral c reduce stance on Lt ?  ?  ?  ?TODAY'S TREATMENT: ?01/10/2022  ?Therapeutic Exercise: ? Nustep seat 11 level 6 UE/LE 10 mins ? Seated SLR x 15 bilateral  ? Supine bridge x 15 (2-3 second hold) ? Sidelying clam shell Lt leg moving 2 x 15 ?  ?TherActivity: ? Sit to stand to sit slow 20 inch chair x 10 no UE for transfer improvement ? Step on over and down 6 inch step box with single hand rail assist in // bars x 10 with each leg to improve stair navigation ?  ? ?01/08/2022  ?Therapeutic Exercise: ? Nustep seat 11 level 6 UE/LE 10 mins ? Seated SLR x 10 bilateral  ?  ?TherActivity: ? Sit to stand to sit slow 20 inch chair x 10 no UE for transfer improvement ? Step on over and down 6 inch step box with single hand rail assist in // bars x 10 with each leg to improve stair navigation ? SPC use in clinic for ambulation to improve confidence in ambulation outside of clinic.  Various household distances performed with supervision.  ? ?Neuromuscular Reeducation ? Heel & Toe raises  on floor 2 x 10   ? Tandem ambulation on foam 8 ft x 10 forward in // bars ? Lateral stepping on foam 8 ft x 3 each way in // bars  ?  ? ? ?01/03/2022  ?Therapeutic Exercise: ? Nustep seat 12 level 6 UE/LE 10 mins ? Sit to/from stand without UE assist from  24" bar stool 5x ?Standing hip abduction, extension 2 x 10 bilateral  c hand on bar (cues for home use) ?Step up 6 inch step 2 x 10 bilateral c bilateral light touch on bars during performance.  ? ?Neuromuscular Reeducation ? Heel & Toe raises  on floor 2 x 10  (cues for home use)   ? Tandem ambulation in // bars with hand assist throughout as needed 10 ft x 6 fwd ? Tandem stance on floor 1 min x 1 bilateral (cues for home use) ?  ?01/01/2022  ?Therapeutic Exercise: ? Nustep seat 12 level 6 with BLEs / BUEs with cues for full range 10 min ? Sit to/from stand without UE assist from 24" bar stool 10x ?Standing with RW support green theraband BLE kicks 4 ways 10 reps ea ?Step up & down 4" step BUE support 10 reps, PT demo & verbal cues on technique.   ? ?Neuromuscular Reeducation ? Heel & Toe raises on foam with intermittent touch //bars 15 reps.   ? Feet together eyes closed on foam c SBA 1 min ? Marching on foam x 15 bilateral light HHA on //bars ? Tandem stance on foam 30sec with intermittent touch //bars ? Sidestepping on foam beam with light UE support left & right 8' x 2 each way ? ?Gait: pt ambulated 50' X 2 without assistive device with supervision & verbal cues on upright trunk, step length and wt shift. ?PT demo & verbal cues on gait with RW at faster pace with longer steps.   ? ?  ?PATIENT EDUCATION:  ?01/10/2022: ?Education details: HEP, POC ?Person educated: Patient ?Education method: Explanation, Demonstration, Verbal cues, and Handouts ?Education comprehension: verbalized understanding and returned demonstration ?  ? HOME EXERCISE PROGRAM: ?01/10/2022:  ? ?Access Code: [email or web address] ?URL: [email or web address] ?Date: 01/10/2022 ?Prepared by: Scot Jun ? ?Exercises ?- Supine Bridge  - 2 x daily - 7 x weekly - 2 sets - 10 reps - 2 hold ?- Single Knee to Chest Stretch (Mirrored)  - 2 x daily - 7 x weekly - 1 sets - 3-5 reps - 15 hold ?- Clamshell  - 1 x daily - 7 x weekly - 3 sets - 10 reps ?- Sit to Stand  - 1 x daily - 7 x weekly - 3 sets - 10 reps ?- Standing  Hip Abduction with Counter Support  - 1 x daily - 7 x weekly - 1-2 sets - 10 reps ?- Standing Hip Extension with Counter Support  - 1 x daily - 7 x weekly - 1-2 sets - 10 reps ?- Standing Tandem Balance with Counter

## 2022-01-11 ENCOUNTER — Encounter: Payer: Self-pay | Admitting: Podiatry

## 2022-01-11 ENCOUNTER — Ambulatory Visit: Payer: Medicare Other | Admitting: Podiatry

## 2022-01-11 VITALS — BP 144/84 | HR 65 | Temp 98.2°F

## 2022-01-11 DIAGNOSIS — R600 Localized edema: Secondary | ICD-10-CM

## 2022-01-11 DIAGNOSIS — B351 Tinea unguium: Secondary | ICD-10-CM

## 2022-01-11 DIAGNOSIS — M2141 Flat foot [pes planus] (acquired), right foot: Secondary | ICD-10-CM

## 2022-01-11 DIAGNOSIS — M2142 Flat foot [pes planus] (acquired), left foot: Secondary | ICD-10-CM

## 2022-01-11 DIAGNOSIS — M79674 Pain in right toe(s): Secondary | ICD-10-CM

## 2022-01-11 DIAGNOSIS — I739 Peripheral vascular disease, unspecified: Secondary | ICD-10-CM

## 2022-01-11 DIAGNOSIS — M79675 Pain in left toe(s): Secondary | ICD-10-CM

## 2022-01-14 NOTE — Therapy (Signed)
?OUTPATIENT PHYSICAL THERAPY TREATMENT NOTE ? ? ?Patient Name: Aaron Gonzalez ?MRN: [id number] ?DOB:10-Aug-1946, 75 y.o., male ?Today's Date: 01/15/2022 ? ?PCP: [name], MD ?REFERRING PROVIDER: Mcarthur Rossetti ? ? PT End of Session - 01/15/22 1354   ? ? Visit Number 11   ? Number of Visits 20   ? Date for PT Re-Evaluation 02/19/22   ? Authorization Type [employer] Medicare   ? Progress Note Due on Visit 20   ? PT Start Time 1348   ? PT Stop Time 7106   ? PT Time Calculation (min) 46 min   ? Equipment Utilized During Treatment Gait belt   ? Activity Tolerance Patient tolerated treatment well   ? Behavior During Therapy [facility] for tasks assessed/performed   ? ?  ?  ? ?  ? ? ? ? ? ?Past Medical History:  ?Diagnosis Date  ? Arthritis   ? CLL (chronic lymphocytic leukemia) ([place])   ? DVT (deep venous thrombosis) ([place])   ? History of rectal cancer   ? Hypertension   ? Stroke [facility])   ? ?Past Surgical History:  ?Procedure Laterality Date  ? COLECTOMY    ? with colostomy  ? TOTAL HIP ARTHROPLASTY Left 10/26/2021  ? Procedure: Left TOTAL HIP ARTHROPLASTY ANTERIOR APPROACH;  Surgeon: Mcarthur Rossetti, MD;  Location: [facility];  Service: Orthopedics;  Laterality: Left;  RNFA  ? ?Patient Active Problem List  ? Diagnosis Date Noted  ? Status post left hip replacement 10/26/2021  ? Unilateral primary osteoarthritis, left hip 08/16/2021  ? Bilateral primary osteoarthritis of hip 08/09/2021  ? Hypertension 08/01/2021  ? Small vessel disease, cerebrovascular 06/23/2021  ? CLL (chronic lymphocytic leukemia) ([place]) 04/16/2021  ? History of stroke 04/16/2021  ? History of rectal cancer 04/16/2021  ? Encounter for antineoplastic chemotherapy 04/16/2021  ? Chronic leukemia ([place]) 04/03/2021  ? Edema of left lower extremity 04/03/2021  ? History of hemorrhagic cerebrovascular accident (CVA) with residual deficit 04/03/2021  ? Acute metabolic encephalopathy 26/94/8546  ? ? ?REFERRING DIAG: [id number] (ICD-10-CM) - Status post left hip  replacement ? ?ONSET DATE: Surgery 10/26/2021 (Lt anterior THA) ? ?THERAPY DIAG:  ?Pain in left hip ? ?Muscle weakness (generalized) ? ?Difficulty in walking, not elsewhere classified ? ?PERTINENT HISTORY: History of DVT, HTN, stroke, rectal cancer ? ?PRECAUTIONS: Anterior hip ? ?SUBJECTIVE:  He is using cane or nothing in home and no concerns with near falls.   ? ?PAIN:  ?Are you having pain?  No  ?NPRS scale:  ?Pain location: hip ?Pain orientation: Lt ?PAIN TYPE: surgery ?Pain description:  ?Aggravating factors:  ?Relieving factors:  ? ?OBJECTIVE:  ?   ?PATIENT SURVEYS:  ?01/10/2022: FOTO update ?12/11/2021: FOTO intake:  55 predicted: 69       ?          ?SENSATION: ?12/11/2021: Light touch: Appears intact ?          ?POSTURE:  ?12/11/2021: rounded shoulder, increased forward trunk lean, forward head posture ?  ?PALPATION: ?12/11/2021: mild tenderness anterior/lateral Lt hip grossly (trigger points noted in lateral hip).  ?  ?LE AROM/PROM: ?  ?A(PROM) Right ?12/11/21 Left ?12/11/21 Left ?01/10/2022  ?Hip flexion A 75 (P 90) in supine A 65(P 75) in supine AROM in supine: ?70  ?Hip extension       ?Hip abduction       ?Hip adduction       ?Hip internal rotation PROM in 70 deg knee flexion supine: 25 PROM in  70 deg knee flexion supine:  20 PROM in 70 deg knee flexion supine: 45  ?Hip external rotation PROM in 70 deg knee flexion supine:  35 PROM in 70 deg knee flexion supine:  30 PROM in 70 deg knee flexion supine: 45  ?Knee flexion       ?Knee extension       ?Ankle dorsiflexion       ?Ankle plantarflexion       ?Ankle inversion       ?Ankle eversion       ?(Blank rows = not tested) ?  ?LE MMT: ?  ?MMT Right ?12/11/21 Left ?12/11/21 Left  ?12/18/21 Left ?01/03/2022 Left ?01/10/2022  ?Hip flexion 5/5 4/5 4+/5 5/5 5/5  ?Hip extension         ?Hip abduction   2/5   3/5  ?Hip adduction         ?Hip internal rotation         ?Hip external rotation         ?Knee flexion 5/5 5/5   5/5  ?Knee extension 5/5 5/5   5/5  ?Ankle dorsiflexion  5/5 5/5   5/5  ?Ankle plantarflexion         ?Ankle inversion         ?Ankle eversion         ?(Blank rows = not tested) ?  ?  ?  ?FUNCTIONAL TESTS:  ?01/10/2022:  TUG:  17 seconds c FWW    25.61 seconds c SPC ? ?12/27/2021:  TUG:  22.17 seconds c FWW   27.9 seconds c SPC ?12/11/2021:   TUG: 31 seconds c FWW ?  ?GAIT: ?01/10/2022:  Able to perform ambulation c SPC in Rt UE within clinic. Reduced gait speed noted c reduced step length bilateral and wider base of support noted.  ? ? ?12/11/2021: ?Distance walked: household distances in clinic < 150 ft ?Assistive device utilized: Environmental consultant - 2 wheeled - FWW ?Level of assistance: Modified independence ?Comments: reduce gait speed, shortened stride bilateral c reduce stance on Lt ?  ?  ?  ?TODAY'S TREATMENT: ?01/15/2022 ?Therapeutic Exercise: ? Nustep seat 11 level 8 UE/LE 10 mins ?  ?TherActivity: ? Sit to stand to sit slow 20 inch chair x 10 no UE for transfer improvement ? Step on over and down 6 inch step box with single hand rail assist in // bars x 10 with each leg to improve stair navigation ? ?Neuromuscular Re-ed: ?Standing with posterior pelvis to counter with tactile, verbal & demo cues on posture. Progressed to alternating hip flexion maintaining posture. ?Standing with BUE on wall with upright posture alternating hip abduction. ?Hip strategy standing crossways on foam beam:  head turns 5 reps ea - side/side, up/down & diagonals and static eyes closed goal 10 sec 3 reps. ?Step strategy - initially anticipatory standing on foam beam stepping off catching balance without touching, progressing to PT push / pull to increase reaction while he was stepping.  5 reps ea LE anteriorly & posteriorly.   ? ?Gait: arrived / exited with RW.  In clinic with cane stand alone tip with supervision, cues for upright posture looking forward / not staring at floor and longer step length BLEs.   ? ? ?01/10/2022  ?Therapeutic Exercise: ? Nustep seat 11 level 6 UE/LE 10 mins ? Seated SLR x 15  bilateral  ? Supine bridge x 15 (2-3 second hold) ? Sidelying clam shell Lt leg moving 2 x 15 ?  ?TherActivity: ?  Sit to stand to sit slow 20 inch chair x 10 no UE for transfer improvement ? Step on over and down 6 inch step box with single hand rail assist in // bars x 10 with each leg to improve stair navigation ?  ? ?01/08/2022  ?Therapeutic Exercise: ? Nustep seat 11 level 6 UE/LE 10 mins ? Seated SLR x 10 bilateral  ?  ?TherActivity: ? Sit to stand to sit slow 20 inch chair x 10 no UE for transfer improvement ? Step on over and down 6 inch step box with single hand rail assist in // bars x 10 with each leg to improve stair navigation ? SPC use in clinic for ambulation to improve confidence in ambulation outside of clinic.  Various household distances performed with supervision.  ? ?Neuromuscular Reeducation ? Heel & Toe raises  on floor 2 x 10   ? Tandem ambulation on foam 8 ft x 10 forward in // bars ? Lateral stepping on foam 8 ft x 3 each way in // bars  ?  ?  ?PATIENT EDUCATION:  ?01/10/2022: ?Education details: HEP, POC ?Person educated: Patient ?Education method: Explanation, Demonstration, Verbal cues, and Handouts ?Education comprehension: verbalized understanding and returned demonstration ?  ? HOME EXERCISE PROGRAM: ?01/10/2022:  ? ?Access Code: [email or web address] ?URL: [email or web address] ?Date: 01/10/2022 ?Prepared by: Scot Jun ? ?Exercises ?- Supine Bridge  - 2 x daily - 7 x weekly - 2 sets - 10 reps - 2 hold ?- Single Knee to Chest Stretch (Mirrored)  - 2 x daily - 7 x weekly - 1 sets - 3-5 reps - 15 hold ?- Clamshell  - 1 x daily - 7 x weekly - 3 sets - 10 reps ?- Sit to Stand  - 1 x daily - 7 x weekly - 3 sets - 10 reps ?- Standing Hip Abduction with Counter Support  - 1 x daily - 7 x weekly - 1-2 sets - 10 reps ?- Standing Hip Extension with Counter Support  - 1 x daily - 7 x weekly - 1-2 sets - 10 reps ?- Standing Tandem Balance with Counter Support  - 1 x daily - 7 x weekly - 1 sets  - 3-5 reps - 20-30 hold ?- Heel Toe Raises with Counter Support  - 1 x daily - 7 x weekly - 1-2 sets - 10 reps ?- Seated Straight Leg Heel Taps  - 1-2 x daily - 7 x weekly - 1-2 sets - 10 reps ? ?  ?ASSESS

## 2022-01-15 ENCOUNTER — Encounter: Payer: Self-pay | Admitting: Physical Therapy

## 2022-01-15 ENCOUNTER — Other Ambulatory Visit: Payer: Self-pay

## 2022-01-15 ENCOUNTER — Ambulatory Visit: Payer: Medicare Other | Admitting: Physical Therapy

## 2022-01-15 DIAGNOSIS — R262 Difficulty in walking, not elsewhere classified: Secondary | ICD-10-CM | POA: Diagnosis not present

## 2022-01-15 DIAGNOSIS — M6281 Muscle weakness (generalized): Secondary | ICD-10-CM

## 2022-01-15 DIAGNOSIS — M25552 Pain in left hip: Secondary | ICD-10-CM | POA: Diagnosis not present

## 2022-01-17 ENCOUNTER — Ambulatory Visit: Payer: Medicare Other | Admitting: Rehabilitative and Restorative Service Providers"

## 2022-01-17 ENCOUNTER — Encounter: Payer: Self-pay | Admitting: Orthopaedic Surgery

## 2022-01-17 ENCOUNTER — Ambulatory Visit (INDEPENDENT_AMBULATORY_CARE_PROVIDER_SITE_OTHER): Payer: Medicare Other | Admitting: Orthopaedic Surgery

## 2022-01-17 ENCOUNTER — Other Ambulatory Visit: Payer: Self-pay

## 2022-01-17 ENCOUNTER — Encounter: Payer: Self-pay | Admitting: Rehabilitative and Restorative Service Providers"

## 2022-01-17 DIAGNOSIS — M25552 Pain in left hip: Secondary | ICD-10-CM

## 2022-01-17 DIAGNOSIS — R262 Difficulty in walking, not elsewhere classified: Secondary | ICD-10-CM

## 2022-01-17 DIAGNOSIS — M6281 Muscle weakness (generalized): Secondary | ICD-10-CM | POA: Diagnosis not present

## 2022-01-17 DIAGNOSIS — Z96642 Presence of left artificial hip joint: Secondary | ICD-10-CM

## 2022-01-17 NOTE — Therapy (Signed)
?OUTPATIENT PHYSICAL THERAPY TREATMENT NOTE ? ? ?Patient Name: Aaron Gonzalez ?MRN: [id number] ?DOB:03/11/1946, 76 y.o., male ?Today's Date: 01/17/2022 ? ?PCP: [name], MD ?REFERRING PROVIDER: Mcarthur Rossetti ? ? PT End of Session - 01/17/22 1306   ? ? Visit Number 12   ? Number of Visits 20   ? Date for PT Re-Evaluation 02/19/22   ? Authorization Type [employer] Medicare   ? Progress Note Due on Visit 20   ? PT Start Time 1259   ? PT Stop Time 7564   ? PT Time Calculation (min) 40 min   ? Activity Tolerance Patient tolerated treatment well   ? Behavior During Therapy [facility] for tasks assessed/performed   ? ?  ?  ? ?  ? ? ? ? ? ? ?Past Medical History:  ?Diagnosis Date  ? Arthritis   ? CLL (chronic lymphocytic leukemia) ([place])   ? DVT (deep venous thrombosis) ([place])   ? History of rectal cancer   ? Hypertension   ? Stroke [facility])   ? ?Past Surgical History:  ?Procedure Laterality Date  ? COLECTOMY    ? with colostomy  ? TOTAL HIP ARTHROPLASTY Left 10/26/2021  ? Procedure: Left TOTAL HIP ARTHROPLASTY ANTERIOR APPROACH;  Surgeon: Mcarthur Rossetti, MD;  Location: [facility];  Service: Orthopedics;  Laterality: Left;  RNFA  ? ?Patient Active Problem List  ? Diagnosis Date Noted  ? Status post left hip replacement 10/26/2021  ? Unilateral primary osteoarthritis, left hip 08/16/2021  ? Bilateral primary osteoarthritis of hip 08/09/2021  ? Hypertension 08/01/2021  ? Small vessel disease, cerebrovascular 06/23/2021  ? CLL (chronic lymphocytic leukemia) ([place]) 04/16/2021  ? History of stroke 04/16/2021  ? History of rectal cancer 04/16/2021  ? Encounter for antineoplastic chemotherapy 04/16/2021  ? Chronic leukemia ([place]) 04/03/2021  ? Edema of left lower extremity 04/03/2021  ? History of hemorrhagic cerebrovascular accident (CVA) with residual deficit 04/03/2021  ? Acute metabolic encephalopathy 33/29/5188  ? ? ?REFERRING DIAG: [id number] (ICD-10-CM) - Status post left hip replacement ? ?ONSET DATE: Surgery 10/26/2021 (Lt  anterior THA) ? ?THERAPY DIAG:  ?Pain in left hip ? ?Muscle weakness (generalized) ? ?Difficulty in walking, not elsewhere classified ? ?PERTINENT HISTORY: History of DVT, HTN, stroke, rectal cancer ? ?PRECAUTIONS: Anterior hip ? ?SUBJECTIVE:  He is using cane or nothing in home and no concerns with near falls.   ? ?PAIN:  ?Are you having pain?  No  ?NPRS scale:  ?Pain location: hip ?Pain orientation: Lt ?PAIN TYPE: surgery ?Pain description:  ?Aggravating factors:  ?Relieving factors:  ? ?OBJECTIVE:  ?   ?PATIENT SURVEYS:  ?01/17/2022: FOTO update : 58 (75 was assessed on 01/10/2022 but had false answers such as being able to run, walk a mile with medium to mild difficulty - Pt has been unable to run for example) ?12/11/2021: FOTO intake:  55 predicted: 69       ?          ?SENSATION: ?12/11/2021: Light touch: Appears intact ?          ?POSTURE:  ?12/11/2021: rounded shoulder, increased forward trunk lean, forward head posture ?  ?PALPATION: ?12/11/2021: mild tenderness anterior/lateral Lt hip grossly (trigger points noted in lateral hip).  ?  ?LE AROM/PROM: ?  ?A(PROM) Right ?12/11/21 Left ?12/11/21 Left ?01/10/2022  ?Hip flexion A 75 (P 90) in supine A 65(P 75) in supine AROM in supine: ?70  ?Hip extension       ?Hip abduction       ?  Hip adduction       ?Hip internal rotation PROM in 70 deg knee flexion supine: 25 PROM in 70 deg knee flexion supine:  20 PROM in 70 deg knee flexion supine: 45  ?Hip external rotation PROM in 70 deg knee flexion supine:  35 PROM in 70 deg knee flexion supine:  30 PROM in 70 deg knee flexion supine: 45  ?Knee flexion       ?Knee extension       ?Ankle dorsiflexion       ?Ankle plantarflexion       ?Ankle inversion       ?Ankle eversion       ?(Blank rows = not tested) ?  ?LE MMT: ?  ?MMT Right ?12/11/21 Left ?12/11/21 Left  ?12/18/21 Left ?01/03/2022 Left ?01/10/2022  ?Hip flexion 5/5 4/5 4+/5 5/5 5/5  ?Hip extension         ?Hip abduction   2/5   3/5  ?Hip adduction         ?Hip internal rotation          ?Hip external rotation         ?Knee flexion 5/5 5/5   5/5  ?Knee extension 5/5 5/5   5/5  ?Ankle dorsiflexion 5/5 5/5   5/5  ?Ankle plantarflexion         ?Ankle inversion         ?Ankle eversion         ?(Blank rows = not tested) ?  ?  ?  ?FUNCTIONAL TESTS:  ?01/10/2022:  TUG:  17 seconds c FWW    25.61 seconds c SPC ? ?12/27/2021:  TUG:  22.17 seconds c FWW   27.9 seconds c SPC ?12/11/2021:   TUG: 31 seconds c FWW ?  ?GAIT: ?01/10/2022:  Able to perform ambulation c SPC in Rt UE within clinic. Reduced gait speed noted c reduced step length bilateral and wider base of support noted.  ? ? ?12/11/2021: ?Distance walked: household distances in clinic < 150 ft ?Assistive device utilized: Environmental consultant - 2 wheeled - FWW ?Level of assistance: Modified independence ?Comments: reduce gait speed, shortened stride bilateral c reduce stance on Lt ?  ?  ?  ?TODAY'S TREATMENT: ?01/17/2022  ?Therapeutic Exercise: ? Nustep seat 11 level 6 UE/LE 8 mins ? Standing hip abduction x 10 bilateral c light single hand touch ?Neuro Re-ed ? Lateral stepping on foam beam in // bars 8 ft x 5 each way ? Standing across foam beam feet together head turns side to side x 10, up/down x10 ? BIG inspired lateral stepping strategy x 10 bilateral, retro step x 10 bilateral ? Standing marching c ipsilateral shoulder flexion x 10 bilateral ?  ?TherActivity: ? Step on over and down 6 inch step box with single hand rail assist in // bars x 10 with each leg to improve stair navigation c focus on level pelvis  ?  ?  ? ?01/15/2022 ?Therapeutic Exercise: ? Nustep seat 11 level 8 UE/LE 10 mins ?  ?TherActivity: ? Sit to stand to sit slow 20 inch chair x 10 no UE for transfer improvement ? Step on over and down 6 inch step box with single hand rail assist in // bars x 10 with each leg to improve stair navigation ? ?Neuromuscular Re-ed: ?Standing with posterior pelvis to counter with tactile, verbal & demo cues on posture. Progressed to alternating hip flexion  maintaining posture. ?Standing with BUE on wall with upright posture alternating hip  abduction. ?Hip strategy standing crossways on foam beam:  head turns 5 reps ea - side/side, up/down & diagonals and static eyes closed goal 10 sec 3 reps. ?Step strategy - initially anticipatory standing on foam beam stepping off catching balance without touching, progressing to PT push / pull to increase reaction while he was stepping.  5 reps ea LE anteriorly & posteriorly.   ? ?Gait: arrived / exited with RW.  In clinic with cane stand alone tip with supervision, cues for upright posture looking forward / not staring at floor and longer step length BLEs.   ? ? ?01/10/2022  ?Therapeutic Exercise: ? Nustep seat 11 level 6 UE/LE 10 mins ? Seated SLR x 15 bilateral  ? Supine bridge x 15 (2-3 second hold) ? Sidelying clam shell Lt leg moving 2 x 15 ?  ?TherActivity: ? Sit to stand to sit slow 20 inch chair x 10 no UE for transfer improvement ? Step on over and down 6 inch step box with single hand rail assist in // bars x 10 with each leg to improve stair navigation ?  ? ?01/08/2022  ?Therapeutic Exercise: ? Nustep seat 11 level 6 UE/LE 10 mins ? Seated SLR x 10 bilateral  ?  ?TherActivity: ? Sit to stand to sit slow 20 inch chair x 10 no UE for transfer improvement ? Step on over and down 6 inch step box with single hand rail assist in // bars x 10 with each leg to improve stair navigation ? SPC use in clinic for ambulation to improve confidence in ambulation outside of clinic.  Various household distances performed with supervision.  ? ?Neuromuscular Reeducation ? Heel & Toe raises  on floor 2 x 10   ? Tandem ambulation on foam 8 ft x 10 forward in // bars ? Lateral stepping on foam 8 ft x 3 each way in // bars  ?  ?  ?PATIENT EDUCATION:  ?01/10/2022: ?Education details: HEP, POC ?Person educated: Patient ?Education method: Explanation, Demonstration, Verbal cues, and Handouts ?Education comprehension: verbalized understanding and  returned demonstration ?  ? HOME EXERCISE PROGRAM: ?01/10/2022:  ? ?Access Code: [email or web address] ?URL: [email or web address] ?Date: 01/10/2022 ?Prepared by: Scot Jun ? ?Exercises ?- Supine Bridge  - 2

## 2022-01-17 NOTE — Progress Notes (Signed)
Subjective: ?Aaron Gonzalez presents today referred by [name], MD for complaint of with chief concern of elongated, thickened, painful, discolored toenails for one year. Patient has tried no attempts at treatment. Pain is getting progressively worse. He is not able to trim them due to physical limitations..  ? ?Past Medical History:  ?Diagnosis Date  ? Arthritis   ? CLL (chronic lymphocytic leukemia) ([place])   ? DVT (deep venous thrombosis) ([place])   ? History of rectal cancer   ? Hypertension   ? Stroke [facility])   ?  ? ?Patient Active Problem List  ? Diagnosis Date Noted  ? Status post left hip replacement 10/26/2021  ? Unilateral primary osteoarthritis, left hip 08/16/2021  ? Bilateral primary osteoarthritis of hip 08/09/2021  ? Hypertension 08/01/2021  ? Small vessel disease, cerebrovascular 06/23/2021  ? CLL (chronic lymphocytic leukemia) ([place]) 04/16/2021  ? History of stroke 04/16/2021  ? History of rectal cancer 04/16/2021  ? Encounter for antineoplastic chemotherapy 04/16/2021  ? Chronic leukemia ([place]) 04/03/2021  ? Edema of left lower extremity 04/03/2021  ? History of hemorrhagic cerebrovascular accident (CVA) with residual deficit 04/03/2021  ? Acute metabolic encephalopathy 29/47/6546  ?  ? ?Past Surgical History:  ?Procedure Laterality Date  ? COLECTOMY    ? with colostomy  ? TOTAL HIP ARTHROPLASTY Left 10/26/2021  ? Procedure: Left TOTAL HIP ARTHROPLASTY ANTERIOR APPROACH;  Surgeon: Mcarthur Rossetti, MD;  Location: [facility];  Service: Orthopedics;  Laterality: Left;  RNFA  ?  ? ?Current Outpatient Medications on File Prior to Visit  ?Medication Sig Dispense Refill  ? allopurinol (ZYLOPRIM) 300 MG tablet Take 300 mg by mouth daily.    ? amLODipine (NORVASC) 10 MG tablet Take 1 tablet (10 mg total) by mouth daily. 30 tablet 1  ? aspirin 81 MG chewable tablet Chew 1 tablet (81 mg total) by mouth 2 (two) times daily. 30 tablet 0  ? blood glucose meter kit and supplies KIT Dispense based on patient  and insurance preference. Use up to four times daily as directed. 1 each 0  ? carvedilol (COREG) 6.25 MG tablet Take 1 tablet (6.25 mg total) by mouth 2 (two) times daily. 180 tablet 3  ? cloNIDine (CATAPRES) 0.1 MG tablet Take 0.1 mg by mouth daily as needed (If  systolic BP >503).    ? enalapril (VASOTEC) 20 MG tablet Take 20 mg by mouth 2 (two) times daily.    ? hydrochlorothiazide (HYDRODIURIL) 12.5 MG tablet Take 12.5 mg by mouth daily.    ? hydrochlorothiazide (HYDRODIURIL) 25 MG tablet Take 1 tablet (25 mg total) by mouth daily. 90 tablet 3  ? ibrutinib (IMBRUVICA) 420 MG TABS Take 420 mg by mouth daily. 30 tablet 3  ? methocarbamol (ROBAXIN) 500 MG tablet TAKE 1 TABLET(500 MG) BY MOUTH EVERY 6 HOURS AS NEEDED FOR MUSCLE SPASMS 30 tablet 0  ? oxyCODONE (OXY IR/ROXICODONE) 5 MG immediate release tablet Take 1-2 tablets (5-10 mg total) by mouth every 6 (six) hours as needed for moderate pain (pain score 4-6). 30 tablet 0  ? pantoprazole (PROTONIX) 40 MG tablet Take 40 mg by mouth daily.    ? rosuvastatin (CRESTOR) 20 MG tablet Take 20 mg by mouth at bedtime.    ? ?No current facility-administered medications on file prior to visit.  ?  ? ?No Known Allergies  ? ?Social History  ? ?Occupational History  ? Not on file  ?Tobacco Use  ? Smoking status: Former  ?  Packs/day: 0.50  ?  Years: 10.00  ?  Pack years: 5.00  ?  Types: Cigarettes  ?  Passive exposure: Never  ? Smokeless tobacco: Never  ?Vaping Use  ? Vaping Use: Never used  ?Substance and Sexual Activity  ? Alcohol use: Never  ? Drug use: Never  ? Sexual activity: Not on file  ?  ? ?Family History  ?Problem Relation Age of Onset  ? Hypertension Mother   ? Heart failure Mother   ? Hypertension Father   ? Stroke Father   ? Breast cancer Sister   ? Stroke Brother   ?  ? ?There is no immunization history on file for this patient.  ? ?Objective: ?Aaron Gonzalez is a pleasant 76 y.o. male WD, WN in NAD. AAO x 3. ? ?Vitals:  ? 01/11/22 0941  ?BP: (!) 144/84   ?Pulse: 65  ?Temp: 98.2 ?F (36.8 ?C)  ? ? ?Vascular Examination:  ?CFT <4 seconds b/l LE. Palpable DP pulse(s) b/l LE. Diminished PT pulse(s) b/l LE. Pedal hair absent. No pain with calf compression b/l. Lower extremity skin temperature gradient within normal limits. +1 pitting edema right lower extremity. +2 pitting edema left lower extremity. No ischemia or gangrene noted b/l LE. No cyanosis or clubbing noted b/l LE. ? ?Dermatological Examination: ?Pedal integument with normal turgor, texture and tone BLE. No open wounds b/l LE. No interdigital macerations noted b/l LE. Toenails 1-5 bilaterally elongated, discolored, dystrophic, thickened, and crumbly with subungual debris and tenderness to dorsal palpation. No hyperkeratotic nor porokeratotic lesions present on today's visit. ? ?Musculoskeletal: ?Muscle strength 5/5 to all lower extremity muscle groups bilaterally. Pes planus deformity noted bilateral LE. Utilizes walker for ambulation assistance. ? ?Neurological: ?Protective sensation intact 5/5 intact bilaterally with 10g monofilament b/l. Vibratory sensation decreased b/l. ? ?Assessment: ?1. Pain due to onychomycosis of toenails of both feet   ?2. Pes planus of both feet   ?3. Edema of left lower extremity   ?4. PAD (peripheral artery disease) ([place])   ?  ?Plan: ?-Patient was evaluated and treated. All patient's and/or POA's questions/concerns answered on today's visit. ?-Patient to continue soft, supportive shoe gear daily. ?-Discussed topical, laser and oral medication for onychomycosis. Patient opted for toenail debridement only on today.  ?-Mycotic toenails 1-5 bilaterally were debrided in length and girth with sterile nail nippers and dremel without incident. ?-Patient/POA to call should there be question/concern in the interim. ? ?Return in about 3 months (around 04/13/2022). ? ?Marzetta Board, DPM ?

## 2022-01-17 NOTE — Progress Notes (Signed)
The patient is now 3 months tomorrow status post a left total hip arthroplasty.  We replaced his right hip a year or 2 ago.  He does have a history of a stroke so his left side is always been weaker.  He still ambulates with a walker but is very satisfied in terms of the range of motion and strength with his left hip and he denies any pain. ? ?His left operative hip is still just slightly stiff comparing left and right sides.  He has good mobility overall. ? ?From my standpoint I will need to see him back now for 6 months unless he is having issues.  We will just have a standing low AP pelvis at that visit. ?

## 2022-01-22 ENCOUNTER — Other Ambulatory Visit: Payer: Self-pay | Admitting: Internal Medicine

## 2022-01-22 ENCOUNTER — Ambulatory Visit: Payer: Medicare Other | Admitting: Physical Therapy

## 2022-01-22 ENCOUNTER — Encounter: Payer: Self-pay | Admitting: Physical Therapy

## 2022-01-22 DIAGNOSIS — R262 Difficulty in walking, not elsewhere classified: Secondary | ICD-10-CM

## 2022-01-22 DIAGNOSIS — M6281 Muscle weakness (generalized): Secondary | ICD-10-CM | POA: Diagnosis not present

## 2022-01-22 DIAGNOSIS — M25552 Pain in left hip: Secondary | ICD-10-CM | POA: Diagnosis not present

## 2022-01-22 NOTE — Therapy (Signed)
?OUTPATIENT PHYSICAL THERAPY TREATMENT NOTE ? ? ?Patient Name: Aaron Gonzalez ?MRN: [id number] ?DOB:April 04, 1946, 76 y.o., male ?Today's Date: 01/22/2022 ? ?PCP: [name], MD ?REFERRING PROVIDER: Mcarthur Rossetti ? ? PT End of Session - 01/22/22 1050   ? ? Visit Number 13   ? Number of Visits 20   ? Date for PT Re-Evaluation 02/19/22   ? Authorization Type [employer] Medicare   ? Progress Note Due on Visit 20   ? PT Start Time 1016   ? PT Stop Time 1056   ? PT Time Calculation (min) 40 min   ? Equipment Utilized During Treatment Gait belt   ? Activity Tolerance Patient tolerated treatment well   ? Behavior During Therapy [facility] for tasks assessed/performed   ? ?  ?  ? ?  ? ? ? ? ? ? ? ?Past Medical History:  ?Diagnosis Date  ? Arthritis   ? CLL (chronic lymphocytic leukemia) ([place])   ? DVT (deep venous thrombosis) ([place])   ? History of rectal cancer   ? Hypertension   ? Stroke [facility])   ? ?Past Surgical History:  ?Procedure Laterality Date  ? COLECTOMY    ? with colostomy  ? TOTAL HIP ARTHROPLASTY Left 10/26/2021  ? Procedure: Left TOTAL HIP ARTHROPLASTY ANTERIOR APPROACH;  Surgeon: Mcarthur Rossetti, MD;  Location: [facility];  Service: Orthopedics;  Laterality: Left;  RNFA  ? ?Patient Active Problem List  ? Diagnosis Date Noted  ? Status post left hip replacement 10/26/2021  ? Unilateral primary osteoarthritis, left hip 08/16/2021  ? Bilateral primary osteoarthritis of hip 08/09/2021  ? Hypertension 08/01/2021  ? Small vessel disease, cerebrovascular 06/23/2021  ? CLL (chronic lymphocytic leukemia) ([place]) 04/16/2021  ? History of stroke 04/16/2021  ? History of rectal cancer 04/16/2021  ? Encounter for antineoplastic chemotherapy 04/16/2021  ? Chronic leukemia ([place]) 04/03/2021  ? Edema of left lower extremity 04/03/2021  ? History of hemorrhagic cerebrovascular accident (CVA) with residual deficit 04/03/2021  ? Acute metabolic encephalopathy 63/78/5885  ? ? ?REFERRING DIAG: [id number] (ICD-10-CM) - Status post left hip  replacement ? ?ONSET DATE: Surgery 10/26/2021 (Lt anterior THA) ? ?THERAPY DIAG:  ?Pain in left hip ? ?Muscle weakness (generalized) ? ?Difficulty in walking, not elsewhere classified ? ?PERTINENT HISTORY: History of DVT, HTN, stroke, rectal cancer ? ?PRECAUTIONS: Anterior hip ? ?SUBJECTIVE:  Pt arriving today using his RW reporting no pain. Pt reporting compliance in his HEP. Pt reporting using a strong arm cane at home for balance and safety.    ? ?PAIN:  ?Are you having pain?  No  ?NPRS scale:  ?Pain location: hip ?Pain orientation: Lt ?PAIN TYPE: surgery ?Pain description:  ?Aggravating factors:  ?Relieving factors:  ? ?OBJECTIVE:  ?   ?PATIENT SURVEYS:  ?01/17/2022: FOTO update : 58 (75 was assessed on 01/10/2022 but had false answers such as being able to run, walk a mile with medium to mild difficulty - Pt has been unable to run for example) ?12/11/2021: FOTO intake:  55 predicted: 69       ?          ?SENSATION: ?12/11/2021: Light touch: Appears intact ?          ?POSTURE:  ?12/11/2021: rounded shoulder, increased forward trunk lean, forward head posture ?  ?PALPATION: ?12/11/2021: mild tenderness anterior/lateral Lt hip grossly (trigger points noted in lateral hip).  ?  ?LE AROM/PROM: ?  ?A(PROM) Right ?12/11/21 Left ?12/11/21 Left ?01/10/2022  ?Hip flexion A 75 (  P 90) in supine A 65(P 75) in supine AROM in supine: ?70  ?Hip extension       ?Hip abduction       ?Hip adduction       ?Hip internal rotation PROM in 70 deg knee flexion supine: 25 PROM in 70 deg knee flexion supine:  20 PROM in 70 deg knee flexion supine: 45  ?Hip external rotation PROM in 70 deg knee flexion supine:  35 PROM in 70 deg knee flexion supine:  30 PROM in 70 deg knee flexion supine: 45  ?Knee flexion       ?Knee extension       ?Ankle dorsiflexion       ?Ankle plantarflexion       ?Ankle inversion       ?Ankle eversion       ?(Blank rows = not tested) ?  ?LE MMT: ?  ?MMT Right ?12/11/21 Left ?12/11/21 Left  ?12/18/21 Left ?01/03/2022  Left ?01/10/2022  ?Hip flexion 5/5 4/5 4+/5 5/5 5/5  ?Hip extension         ?Hip abduction   2/5   3/5  ?Hip adduction         ?Hip internal rotation         ?Hip external rotation         ?Knee flexion 5/5 5/5   5/5  ?Knee extension 5/5 5/5   5/5  ?Ankle dorsiflexion 5/5 5/5   5/5  ?Ankle plantarflexion         ?Ankle inversion         ?Ankle eversion         ?(Blank rows = not tested) ?  ?  ?  ?FUNCTIONAL TESTS:  ?01/10/2022:  TUG:  17 seconds c FWW    25.61 seconds c SPC ? ?12/27/2021:  TUG:  22.17 seconds c FWW   27.9 seconds c SPC ?12/11/2021:   TUG: 31 seconds c FWW ?  ?GAIT: ?01/10/2022:  Able to perform ambulation c SPC in Rt UE within clinic. Reduced gait speed noted c reduced step length bilateral and wider base of support noted.  ? ? ?12/11/2021: ?Distance walked: household distances in clinic < 150 ft ?Assistive device utilized: Environmental consultant - 2 wheeled - FWW ?Level of assistance: Modified independence ?Comments: reduce gait speed, shortened stride bilateral c reduce stance on Lt ?  ?  ?  ?TODAY'S TREATMENT: ?01/22/2022 ?Therapeutic Exercise: ? Standing hip abduction x 15 bilateral c light single hand touch ? Standing hip extension x 15 bilateral with intermittent UE support ? Seated hip flexion to tap cones alternating each LE ?Neuro Re-ed ? Lateral stepping on foam beam in // bars 8 ft x 5 each way ? Head turns when walking with st cane with close supervision ? lateral stepping over cone lying on it's side, c difficulty and UE support ? Standing heel taps on 2 cones alternating x 30 ?  ?TherActivity: ? Sit to stand x 20 with no UE support from standard arm chair  ?Gait:  ? Amb with st cane using left UE 300 feet cues for increased step length ? TUG: with St cane 32 seconds  ? TUG: with RW 23 seconds ? ?01/17/2022  ?Therapeutic Exercise: ? Nustep seat 11 level 6 UE/LE 8 mins ? Standing hip abduction x 10 bilateral c light single hand touch ?Neuro Re-ed ? Lateral stepping on foam beam in // bars 8 ft x 5 each  way ? Standing across foam beam feet  together head turns side to side x 10, up/down x10 ? BIG inspired lateral stepping strategy x 10 bilateral, retro step x 10 bilateral ? Standing marching c ipsilateral shoulder flexion x 10 bilateral ?  ?TherActivity: ? Step on over and down 6 inch step box with single hand rail assist in // bars x 10 with each leg to improve stair navigation c focus on level pelvis  ?  ?  ? ?01/15/2022 ?Therapeutic Exercise: ? Nustep seat 11 level 8 UE/LE 10 mins ?  ?TherActivity: ? Sit to stand to sit slow 20 inch chair x 10 no UE for transfer improvement ? Step on over and down 6 inch step box with single hand rail assist in // bars x 10 with each leg to improve stair navigation ? ?Neuromuscular Re-ed: ?Standing with posterior pelvis to counter with tactile, verbal & demo cues on posture. Progressed to alternating hip flexion maintaining posture. ?Standing with BUE on wall with upright posture alternating hip abduction. ?Hip strategy standing crossways on foam beam:  head turns 5 reps ea - side/side, up/down & diagonals and static eyes closed goal 10 sec 3 reps. ?Step strategy - initially anticipatory standing on foam beam stepping off catching balance without touching, progressing to PT push / pull to increase reaction while he was stepping.  5 reps ea LE anteriorly & posteriorly.   ? ?Gait: arrived / exited with RW.  In clinic with cane stand alone tip with supervision, cues for upright posture looking forward / not staring at floor and longer step length BLEs.   ? ? ?01/10/2022  ?Therapeutic Exercise: ? Nustep seat 11 level 6 UE/LE 10 mins ? Seated SLR x 15 bilateral  ? Supine bridge x 15 (2-3 second hold) ? Sidelying clam shell Lt leg moving 2 x 15 ?  ?TherActivity: ? Sit to stand to sit slow 20 inch chair x 10 no UE for transfer improvement ? Step on over and down 6 inch step box with single hand rail assist in // bars x 10 with each leg to improve stair navigation ?  ? ? ?  ?  ?PATIENT  EDUCATION:  ?01/10/2022: ?Education details: HEP, POC ?Person educated: Patient ?Education method: Explanation, Demonstration, Verbal cues, and Handouts ?Education comprehension: verbalized understanding and returned demonstrati

## 2022-01-24 ENCOUNTER — Encounter: Payer: Self-pay | Admitting: Rehabilitative and Restorative Service Providers"

## 2022-01-24 ENCOUNTER — Ambulatory Visit: Payer: Medicare Other | Admitting: Rehabilitative and Restorative Service Providers"

## 2022-01-24 DIAGNOSIS — M6281 Muscle weakness (generalized): Secondary | ICD-10-CM

## 2022-01-24 DIAGNOSIS — R262 Difficulty in walking, not elsewhere classified: Secondary | ICD-10-CM

## 2022-01-24 DIAGNOSIS — M25552 Pain in left hip: Secondary | ICD-10-CM

## 2022-01-24 NOTE — Therapy (Signed)
?OUTPATIENT PHYSICAL THERAPY TREATMENT NOTE ? ? ?Patient Name: Aaron Gonzalez ?MRN: [id number] ?DOB:Feb 25, 1946, 76 y.o., male ?Today's Date: 01/24/2022 ? ?PCP: Aaron Nova, MD ?REFERRING PROVIDER: Mcarthur Gonzalez ? ? PT End of Session - 01/24/22 1354   ? ? Visit Number 14   ? Number of Visits 20   ? Date for PT Re-Evaluation 02/19/22   ? Authorization Type [employer] Medicare   ? Progress Note Due on Visit 20   ? PT Start Time 1346   ? PT Stop Time 9924   ? PT Time Calculation (min) 43 min   ? Equipment Utilized During Treatment --   ? Activity Tolerance Patient tolerated treatment well   ? Behavior During Therapy [facility] for tasks assessed/performed   ? ?  ?  ? ?  ? ? ? ? ? ? ? ? ?Past Medical History:  ?Diagnosis Date  ? Arthritis   ? CLL (chronic lymphocytic leukemia) ([place])   ? DVT (deep venous thrombosis) ([place])   ? History of rectal cancer   ? Hypertension   ? Stroke [facility])   ? ?Past Surgical History:  ?Procedure Laterality Date  ? COLECTOMY    ? with colostomy  ? TOTAL HIP ARTHROPLASTY Left 10/26/2021  ? Procedure: Left TOTAL HIP ARTHROPLASTY ANTERIOR APPROACH;  Surgeon: Aaron Rossetti, MD;  Location: [facility];  Service: Orthopedics;  Laterality: Left;  RNFA  ? ?Patient Active Problem List  ? Diagnosis Date Noted  ? Status post left hip replacement 10/26/2021  ? Unilateral primary osteoarthritis, left hip 08/16/2021  ? Bilateral primary osteoarthritis of hip 08/09/2021  ? Hypertension 08/01/2021  ? Small vessel disease, cerebrovascular 06/23/2021  ? CLL (chronic lymphocytic leukemia) ([place]) 04/16/2021  ? History of stroke 04/16/2021  ? History of rectal cancer 04/16/2021  ? Encounter for antineoplastic chemotherapy 04/16/2021  ? Chronic leukemia ([place]) 04/03/2021  ? Edema of left lower extremity 04/03/2021  ? History of hemorrhagic cerebrovascular accident (CVA) with residual deficit 04/03/2021  ? Acute metabolic encephalopathy 26/83/4196  ? ? ?REFERRING DIAG: [id number] (ICD-10-CM) - Status post left hip  replacement ? ?ONSET DATE: Surgery 10/26/2021 (Lt anterior THA) ? ?THERAPY DIAG:  ?Difficulty in walking, not elsewhere classified ? ?Muscle weakness (generalized) ? ?Pain in left hip ? ?PERTINENT HISTORY: History of DVT, HTN, stroke, rectal cancer ? ?PRECAUTIONS: Anterior hip ? ?SUBJECTIVE:  Aaron Gonzalez reports good HEP compliance.  He did mention he "rests a lot" during the day when not doing his HEP so WB function is still limited. ? ?PAIN:  ?Are you having pain?  No  ?NPRS scale:  ?Pain location: hip ?Pain orientation: Lt ?PAIN TYPE: surgery ?Pain description:  ?Aggravating factors:  ?Relieving factors:  ? ?OBJECTIVE:  ?   ?PATIENT SURVEYS:  ?01/17/2022: FOTO update : 58 (75 was assessed on 01/10/2022 but had false answers such as being able to run, walk a mile with medium to mild difficulty - Pt has been unable to run for example) ?12/11/2021: FOTO intake:  55 predicted: 69       ?          ?SENSATION: ?12/11/2021: Light touch: Appears intact ?          ?POSTURE:  ?12/11/2021: rounded shoulder, increased forward trunk lean, forward head posture ?  ?PALPATION: ?12/11/2021: mild tenderness anterior/lateral Lt hip grossly (trigger points noted in lateral hip).  ?  ?LE AROM/PROM: ?  ?A(PROM) Right ?12/11/21 Left ?12/11/21 Left ?01/10/2022  ?Hip flexion A 75 (P 90) in supine  A 65(P 75) in supine AROM in supine: ?70  ?Hip extension       ?Hip abduction       ?Hip adduction       ?Hip internal rotation PROM in 70 deg knee flexion supine: 25 PROM in 70 deg knee flexion supine:  20 PROM in 70 deg knee flexion supine: 45  ?Hip external rotation PROM in 70 deg knee flexion supine:  35 PROM in 70 deg knee flexion supine:  30 PROM in 70 deg knee flexion supine: 45  ?Knee flexion       ?Knee extension       ?Ankle dorsiflexion       ?Ankle plantarflexion       ?Ankle inversion       ?Ankle eversion       ?(Blank rows = not tested) ?  ?LE MMT: ?  ?MMT Right ?12/11/21 Left ?12/11/21 Left  ?12/18/21 Left ?01/03/2022 Left ?01/10/2022  ?Hip flexion 5/5  4/5 4+/5 5/5 5/5  ?Hip extension         ?Hip abduction   2/5   3/5  ?Hip adduction         ?Hip internal rotation         ?Hip external rotation         ?Knee flexion 5/5 5/5   5/5  ?Knee extension 5/5 5/5   5/5  ?Ankle dorsiflexion 5/5 5/5   5/5  ?Ankle plantarflexion         ?Ankle inversion         ?Ankle eversion         ?(Blank rows = not tested) ?  ?  ?  ?FUNCTIONAL TESTS:  ?01/10/2022:  TUG:  17 seconds c FWW    25.61 seconds c SPC ? ?12/27/2021:  TUG:  22.17 seconds c FWW   27.9 seconds c SPC ?12/11/2021:   TUG: 31 seconds c FWW ?  ?GAIT: ?01/10/2022:  Able to perform ambulation c SPC in Rt UE within clinic. Reduced gait speed noted c reduced step length bilateral and wider base of support noted.  ? ? ?12/11/2021: ?Distance walked: household distances in clinic < 150 ft ?Assistive device utilized: Environmental consultant - 2 wheeled - FWW ?Level of assistance: Modified independence ?Comments: reduce gait speed, shortened stride bilateral c reduce stance on Lt ?  ?  ?  ?TODAY'S TREATMENT: ?4/6/203 ?Therapeutic Exercises: ?Nu-Step 8 minutes Level 8 ?Hip hike in walker (alternating) 2 sets of 10 for 3 seconds ?Sit to stand slow eccentrics 2 sets of 5 ?Leg Press Double leg 75# 10X slow eccentrics and stretch into hip flexion ?Single leg (L) 37# 10X slow eccentrics and stretch into hip flexion ? ?Neuromuscular Re-education: ?Tandem balance: eyes open and closed 4X 20 seconds each (B) ?Dynamic HT gait in parallel bars 4 laps ? ?Functional Activities: Lateral heel taps for steps and curbs 10X B off 4 inch step ? ? ?01/22/2022 ?Therapeutic Exercise: ? Standing hip abduction x 15 bilateral c light single hand touch ? Standing hip extension x 15 bilateral with intermittent UE support ? Seated hip flexion to tap cones alternating each LE ?Neuro Re-ed ? Lateral stepping on foam beam in // bars 8 ft x 5 each way ? Head turns when walking with st cane with close supervision ? lateral stepping over cone lying on it's side, c difficulty and UE  support ? Standing heel taps on 2 cones alternating x 30 ?  ?TherActivity: ? Sit to stand x  20 with no UE support from standard arm chair  ?Gait:  ? Amb with st cane using left UE 300 feet cues for increased step length ? TUG: with St cane 32 seconds  ? TUG: with RW 23 seconds ? ? ?01/17/2022  ?Therapeutic Exercise: ? Nustep seat 11 level 6 UE/LE 8 mins ? Standing hip abduction x 10 bilateral c light single hand touch ?Neuro Re-ed ? Lateral stepping on foam beam in // bars 8 ft x 5 each way ? Standing across foam beam feet together head turns side to side x 10, up/down x10 ? BIG inspired lateral stepping strategy x 10 bilateral, retro step x 10 bilateral ? Standing marching c ipsilateral shoulder flexion x 10 bilateral ?  ?TherActivity: ? Step on over and down 6 inch step box with single hand rail assist in // bars x 10 with each leg to improve stair navigation c focus on level pelvis  ?  ?  ?  ?  ?PATIENT EDUCATION:  ?01/10/2022: ?Education details: HEP, POC ?Person educated: Patient ?Education method: Explanation, Demonstration, Verbal cues, and Handouts ?Education comprehension: verbalized understanding and returned demonstration ?  ? HOME EXERCISE PROGRAM: ? Access Code: [email or web address] ?URL: [email or web address] ?Date: 01/24/2022 ?Prepared by: Vista Mink ? ?Exercises ?- Supine Bridge  - 2 x daily - 7 x weekly - 2 sets - 10 reps - 2 hold ?- Single Knee to Chest Stretch (Mirrored)  - 2 x daily - 7 x weekly - 1 sets - 3-5 reps - 15 hold ?- Clamshell  - 1 x daily - 7 x weekly - 3 sets - 10 reps ?- Sit to Stand  - 1 x daily - 7 x weekly - 3 sets - 10 reps ?- Standing Hip Abduction with Counter Support  - 1 x daily - 7 x weekly - 1-2 sets - 10 reps ?- Standing Hip Extension with Counter Support  - 1 x daily - 7 x weekly - 1-2 sets - 10 reps ?- Standing Tandem Balance with Counter Support  - 1 x daily - 7 x weekly - 1 sets - 3-5 reps - 20-30 hold ?- Heel Toe Raises with Counter Support  - 1 x daily - 7 x  weekly - 1-2 sets - 10 reps ?- Seated Straight Leg Heel Taps  - 1-2 x daily - 7 x weekly - 1-2 sets - 10 reps ?- Standing Hip Hiking  - 3-5 x daily - 7 x weekly - 1-2 sets - 5-10 reps - 3 seconds hold ?- Tandem St

## 2022-01-28 ENCOUNTER — Telehealth: Payer: Self-pay

## 2022-01-28 ENCOUNTER — Other Ambulatory Visit: Payer: Self-pay

## 2022-01-28 ENCOUNTER — Ambulatory Visit: Payer: Medicare Other | Admitting: Physical Therapy

## 2022-01-28 ENCOUNTER — Encounter: Payer: Self-pay | Admitting: Physical Therapy

## 2022-01-28 ENCOUNTER — Ambulatory Visit (HOSPITAL_COMMUNITY)
Admission: RE | Admit: 2022-01-28 | Discharge: 2022-01-28 | Disposition: A | Discharge status: Home / Self Care | Payer: Medicare Other | Source: Ambulatory Visit | Attending: Orthopaedic Surgery | Admitting: Orthopaedic Surgery

## 2022-01-28 DIAGNOSIS — M25552 Pain in left hip: Secondary | ICD-10-CM

## 2022-01-28 DIAGNOSIS — Z96642 Presence of left artificial hip joint: Secondary | ICD-10-CM | POA: Diagnosis present

## 2022-01-28 DIAGNOSIS — R262 Difficulty in walking, not elsewhere classified: Secondary | ICD-10-CM | POA: Diagnosis not present

## 2022-01-28 DIAGNOSIS — M6281 Muscle weakness (generalized): Secondary | ICD-10-CM | POA: Diagnosis not present

## 2022-01-28 NOTE — Telephone Encounter (Signed)
Aaron Gonzalez from [facility] radiology called to report Negative for DVT on Left leg  ?

## 2022-01-28 NOTE — Progress Notes (Signed)
as

## 2022-01-28 NOTE — Therapy (Signed)
?OUTPATIENT PHYSICAL THERAPY TREATMENT NOTE ? ? ?Patient Name: Aaron Gonzalez ?MRN: [id number] ?DOB:[birth year], 76 y.o., male ?Today's Date: 01/28/2022 ? ?PCP: [name], MD ?REFERRING PROVIDER: Mcarthur Rossetti ? ? PT End of Session - 01/28/22 1349   ? ? Visit Number 15   ? Number of Visits 20   ? Date for PT Re-Evaluation 02/19/22   ? Authorization Type [employer] Medicare   ? Progress Note Due on Visit 20   ? PT Start Time 1344   ? PT Stop Time 8882   ? PT Time Calculation (min) 38 min   ? Activity Tolerance Patient tolerated treatment well   ? Behavior During Therapy [facility] for tasks assessed/performed   ? ?  ?  ? ?  ? ? ? ? ? ? ? ? ? ?Past Medical History:  ?Diagnosis Date  ? Arthritis   ? CLL (chronic lymphocytic leukemia) ([place])   ? DVT (deep venous thrombosis) ([place])   ? History of rectal cancer   ? Hypertension   ? Stroke [facility])   ? ?Past Surgical History:  ?Procedure Laterality Date  ? COLECTOMY    ? with colostomy  ? TOTAL HIP ARTHROPLASTY Left 10/26/2021  ? Procedure: Left TOTAL HIP ARTHROPLASTY ANTERIOR APPROACH;  Surgeon: Mcarthur Rossetti, MD;  Location: [facility];  Service: Orthopedics;  Laterality: Left;  RNFA  ? ?Patient Active Problem List  ? Diagnosis Date Noted  ? Status post left hip replacement 10/26/2021  ? Unilateral primary osteoarthritis, left hip 08/16/2021  ? Bilateral primary osteoarthritis of hip 08/09/2021  ? Hypertension 08/01/2021  ? Small vessel disease, cerebrovascular 06/23/2021  ? CLL (chronic lymphocytic leukemia) ([place]) 04/16/2021  ? History of stroke 04/16/2021  ? History of rectal cancer 04/16/2021  ? Encounter for antineoplastic chemotherapy 04/16/2021  ? Chronic leukemia ([place]) 04/03/2021  ? Edema of left lower extremity 04/03/2021  ? History of hemorrhagic cerebrovascular accident (CVA) with residual deficit 04/03/2021  ? Acute metabolic encephalopathy 80/12/4915  ? ? ?REFERRING DIAG: [id number] (ICD-10-CM) - Status post left hip replacement ? ?ONSET DATE: Surgery 10/26/2021  (Lt anterior THA) ? ?THERAPY DIAG:  ?Difficulty in walking, not elsewhere classified ? ?Muscle weakness (generalized) ? ?Pain in left hip ? ?PERTINENT HISTORY: History of DVT, HTN, stroke, rectal cancer ? ?PRECAUTIONS: Anterior hip ? ?SUBJECTIVE: He did AHOY program this morning.  PT made not of increased left leg swelling. Patient was not certain when increased swelling happened.  ? ?PAIN:  ?Are you having pain?  No  ?NPRS scale:  ?Pain location: hip ?Pain orientation: Lt ?PAIN TYPE: surgery ?Pain description:  ?Aggravating factors:  ?Relieving factors:  ? ?OBJECTIVE:  ?   ?PATIENT SURVEYS:  ?01/17/2022: FOTO update : 58 (75 was assessed on 01/10/2022 but had false answers such as being able to run, walk a mile with medium to mild difficulty - Pt has been unable to run for example) ?12/11/2021: FOTO intake:  55 predicted: 69       ?          ?SENSATION: ?12/11/2021: Light touch: Appears intact ?          ?POSTURE:  ?12/11/2021: rounded shoulder, increased forward trunk lean, forward head posture ?  ?PALPATION: ?12/11/2021: mild tenderness anterior/lateral Lt hip grossly (trigger points noted in lateral hip).  ?  ?LE AROM/PROM: ?  ?A(PROM) Right ?12/11/21 Left ?12/11/21 Left ?01/10/2022  ?Hip flexion A 75 (P 90) in supine A 65(P 75) in supine AROM in supine: ?70  ?Hip  extension       ?Hip abduction       ?Hip adduction       ?Hip internal rotation PROM in 70 deg knee flexion supine: 25 PROM in 70 deg knee flexion supine:  20 PROM in 70 deg knee flexion supine: 45  ?Hip external rotation PROM in 70 deg knee flexion supine:  35 PROM in 70 deg knee flexion supine:  30 PROM in 70 deg knee flexion supine: 45  ?Knee flexion       ?Knee extension       ?Ankle dorsiflexion       ?Ankle plantarflexion       ?Ankle inversion       ?Ankle eversion       ?(Blank rows = not tested) ?  ?LE MMT: ?  ?MMT Right ?12/11/21 Left ?12/11/21 Left  ?12/18/21 Left ?01/03/2022 Left ?01/10/2022  ?Hip flexion 5/5 4/5 4+/5 5/5 5/5  ?Hip extension          ?Hip abduction   2/5   3/5  ?Hip adduction         ?Hip internal rotation         ?Hip external rotation         ?Knee flexion 5/5 5/5   5/5  ?Knee extension 5/5 5/5   5/5  ?Ankle dorsiflexion 5/5 5/5   5/5  ?Ankle plantarflexion         ?Ankle inversion         ?Ankle eversion         ?(Blank rows = not tested) ?  ?  ?  ?FUNCTIONAL TESTS:  ?01/10/2022:  TUG:  17 seconds c FWW    25.61 seconds c SPC ? ?12/27/2021:  TUG:  22.17 seconds c FWW   27.9 seconds c SPC ?12/11/2021:   TUG: 31 seconds c FWW ?  ?GAIT: ?01/10/2022:  Able to perform ambulation c SPC in Rt UE within clinic. Reduced gait speed noted c reduced step length bilateral and wider base of support noted.  ? ? ?12/11/2021: ?Distance walked: household distances in clinic < 150 ft ?Assistive device utilized: Environmental consultant - 2 wheeled - FWW ?Level of assistance: Modified independence ?Comments: reduce gait speed, shortened stride bilateral c reduce stance on Lt ?  ?  ?  ?TODAY'S TREATMENT: ?01/28/2022 ?Edema:  ?LLE: prox to ankle 30.8cm, mid calf 40.8cm; prox to knee 41.9cm ?RLE: prox to ankle 25.3cm, mid calf 31cm; prox to knee 41cm ?He has history of DVT.  ?PT sent message to Anise Salvo, [place] who spoken to Dr. Ninfa Linden. Doppler ordered at [facility] today at 3:00pm ?PT educated on signs of PE with need to call "911" pt verbalized understanding.   ? ?Therapeutic Exercises: ?Nu-Step 8 minutes Level 8 LEs only ?Sit to stand slow eccentrics 2 sets of 5 ?Leg Press Double leg 81# 10X 2 sets slow eccentrics and stretch into hip flexion ?Single leg (L) 37# 15X slow eccentrics and stretch into hip flexion ?Standing hip abduction x 15 bilateral c light single hand touch ? Standing hip extension x 15 bilateral with intermittent UE support ? ?Neuromuscular Re-education: ?Tandem balance: eyes open 2 reps X 20 seconds each (B);  feet together eyes closed with supervision. ? ?Functional Activities:  Pt ambulated in clinic without assistive device.  ? ?4/6/203 ?Therapeutic  Exercises: ?Nu-Step 8 minutes Level 8 ?Hip hike in walker (alternating) 2 sets of 10 for 3 seconds ?Sit to stand slow eccentrics 2 sets of 5 ?Leg Press  Double leg 75# 10X slow eccentrics and stretch into hip flexion ?Single leg (L) 37# 10X slow eccentrics and stretch into hip flexion ? ? ?Neuromuscular Re-education: ?Tandem balance: eyes open and closed 4X 20 seconds each (B) ?Dynamic HT gait in parallel bars 4 laps ? ?Functional Activities: Lateral heel taps for steps and curbs 10X B off 4 inch step ? ? ?01/22/2022 ?Therapeutic Exercise: ? Standing hip abduction x 15 bilateral c light single hand touch ? Standing hip extension x 15 bilateral with intermittent UE support ? Seated hip flexion to tap cones alternating each LE ?Neuro Re-ed ? Lateral stepping on foam beam in // bars 8 ft x 5 each way ? Head turns when walking with st cane with close supervision ? lateral stepping over cone lying on it's side, c difficulty and UE support ? Standing heel taps on 2 cones alternating x 30 ?  ?TherActivity: ? Sit to stand x 20 with no UE support from standard arm chair  ?Gait:  ? Amb with st cane using left UE 300 feet cues for increased step length ? TUG: with St cane 32 seconds  ? TUG: with RW 23 seconds ? ? ?  ?  ?PATIENT EDUCATION:  ?01/10/2022: ?Education details: HEP, POC ?Person educated: Patient ?Education method: Explanation, Demonstration, Verbal cues, and Handouts ?Education comprehension: verbalized understanding and returned demonstration ?  ? HOME EXERCISE PROGRAM: ? Access Code: [email or web address] ?URL: [email or web address] ?Date: 01/24/2022 ?Prepared by: Vista Mink ? ?Exercises ?- Supine Bridge  - 2 x daily - 7 x weekly - 2 sets - 10 reps - 2 hold ?- Single Knee to Chest Stretch (Mirrored)  - 2 x daily - 7 x weekly - 1 sets - 3-5 reps - 15 hold ?- Clamshell  - 1 x daily - 7 x weekly - 3 sets - 10 reps ?- Sit to Stand  - 1 x daily - 7 x weekly - 3 sets - 10 reps ?- Standing Hip Abduction with Counter  Support  - 1 x daily - 7 x weekly - 1-2 sets - 10 reps ?- Standing Hip Extension with Counter Support  - 1 x daily - 7 x weekly - 1-2 sets - 10 reps ?- Standing Tandem Balance with Counter Support  - 1 x daily - 7 x weekly

## 2022-01-28 NOTE — Progress Notes (Signed)
Left lower extremity venous duplex has been completed. ?Preliminary results can be found in CV Proc through chart review.  ?Results were given to [facility] at Dr. Trevor Mace office. ? ?01/28/22 3:10 PM ?Aaron Gonzalez RVT   ?

## 2022-01-30 ENCOUNTER — Encounter: Payer: Medicare Other | Admitting: Rehabilitative and Restorative Service Providers"

## 2022-02-05 ENCOUNTER — Encounter: Payer: Self-pay | Admitting: Physical Therapy

## 2022-02-05 ENCOUNTER — Ambulatory Visit: Payer: Medicare Other | Admitting: Physical Therapy

## 2022-02-05 ENCOUNTER — Other Ambulatory Visit: Payer: Self-pay

## 2022-02-05 DIAGNOSIS — R262 Difficulty in walking, not elsewhere classified: Secondary | ICD-10-CM

## 2022-02-05 DIAGNOSIS — M25552 Pain in left hip: Secondary | ICD-10-CM

## 2022-02-05 DIAGNOSIS — M6281 Muscle weakness (generalized): Secondary | ICD-10-CM | POA: Diagnosis not present

## 2022-02-05 NOTE — Therapy (Signed)
?OUTPATIENT PHYSICAL THERAPY TREATMENT NOTE ? ? ?Patient Name: Aaron Gonzalez ?MRN: [id number] ?DOB:Dec 12, 1945, 76 y.o., male ?Today's Date: 02/05/2022 ? ?PCP: Aaron Nova, MD ?REFERRING PROVIDER: Mcarthur Gonzalez ? ? PT End of Session - 02/05/22 1105   ? ? Visit Number 16   ? Number of Visits 20   ? Date for PT Re-Evaluation 02/19/22   ? Authorization Type [employer] Medicare   ? Progress Note Due on Visit 20   ? PT Start Time 1100   ? PT Stop Time 1144   ? PT Time Calculation (min) 44 min   ? Activity Tolerance Patient tolerated treatment well   ? Behavior During Therapy Aaron Gonzalez for tasks assessed/performed   ? ?  ?  ? ?  ? ? ? ? ? ? ? ? ? ? ?Past Medical History:  ?Diagnosis Date  ? Arthritis   ? CLL (chronic lymphocytic leukemia) (Aaron Gonzalez)   ? DVT (deep venous thrombosis) ([place])   ? History of rectal cancer   ? Hypertension   ? Stroke Aaron Gonzalez)   ? ?Past Surgical History:  ?Procedure Laterality Date  ? COLECTOMY    ? with colostomy  ? TOTAL HIP ARTHROPLASTY Left 10/26/2021  ? Procedure: Left TOTAL HIP ARTHROPLASTY ANTERIOR APPROACH;  Surgeon: Aaron Rossetti, MD;  Location: [facility];  Service: Orthopedics;  Laterality: Left;  RNFA  ? ?Patient Active Problem List  ? Diagnosis Date Noted  ? Status post left hip replacement 10/26/2021  ? Unilateral primary osteoarthritis, left hip 08/16/2021  ? Bilateral primary osteoarthritis of hip 08/09/2021  ? Hypertension 08/01/2021  ? Small vessel disease, cerebrovascular 06/23/2021  ? CLL (chronic lymphocytic leukemia) ([place]) 04/16/2021  ? History of stroke 04/16/2021  ? History of rectal cancer 04/16/2021  ? Encounter for antineoplastic chemotherapy 04/16/2021  ? Chronic leukemia (Aaron Gonzalez) 04/03/2021  ? Edema of left lower extremity 04/03/2021  ? History of hemorrhagic cerebrovascular accident (CVA) with residual deficit 04/03/2021  ? Acute metabolic encephalopathy 37/62/8315  ? ? ?REFERRING DIAG: [id number] (ICD-10-CM) - Status post left hip replacement ? ?ONSET DATE: Surgery 10/26/2021  (Lt anterior THA) ? ?THERAPY DIAG:  ?Difficulty in walking, not elsewhere classified ? ?Muscle weakness (generalized) ? ?Pain in left hip ? ?PERTINENT HISTORY: History of DVT, HTN, stroke, rectal cancer ? ?PRECAUTIONS: Anterior hip ? ?SUBJECTIVE: US showed no DVT. His sister went on cruise & he was home alone with no issues.  ? ?PAIN:  ?Are you having pain?   No  ?NPRS scale:  ?Pain location: hip ?Pain orientation: Lt ?PAIN TYPE: surgery ?Pain description:  ?Aggravating factors:  ?Relieving factors:  ? ?OBJECTIVE:  ?   ?PATIENT SURVEYS:  ?01/17/2022: FOTO update : 58 (75 was assessed on 01/10/2022 but had false answers such as being able to run, walk a mile with medium to mild difficulty - Pt has been unable to run for example) ?12/11/2021: FOTO intake:  55 predicted: 69       ?          ?SENSATION: ?12/11/2021: Light touch: Appears intact ?          ?POSTURE:  ?12/11/2021: rounded shoulder, increased forward trunk lean, forward head posture ?  ?PALPATION: ?12/11/2021: mild tenderness anterior/lateral Lt hip grossly (trigger points noted in lateral hip).  ?  ?LE AROM/PROM: ?  ?A(PROM) Right ?12/11/21 Left ?12/11/21 Left ?01/10/2022  ?Hip flexion A 75 (P 90) in supine A 65(P 75) in supine AROM in supine: ?70  ?Hip extension       ?  Hip abduction       ?Hip adduction       ?Hip internal rotation PROM in 70 deg knee flexion supine: 25 PROM in 70 deg knee flexion supine:  20 PROM in 70 deg knee flexion supine: 45  ?Hip external rotation PROM in 70 deg knee flexion supine:  35 PROM in 70 deg knee flexion supine:  30 PROM in 70 deg knee flexion supine: 45  ?Knee flexion       ?Knee extension       ?Ankle dorsiflexion       ?Ankle plantarflexion       ?Ankle inversion       ?Ankle eversion       ?(Blank rows = not tested) ?  ?LE MMT: ?  ?MMT Right ?12/11/21 Left ?12/11/21 Left  ?12/18/21 Left ?01/03/2022 Left ?01/10/2022  ?Hip flexion 5/5 4/5 4+/5 5/5 5/5  ?Hip extension         ?Hip abduction   2/5   3/5  ?Hip adduction         ?Hip  internal rotation         ?Hip external rotation         ?Knee flexion 5/5 5/5   5/5  ?Knee extension 5/5 5/5   5/5  ?Ankle dorsiflexion 5/5 5/5   5/5  ?Ankle plantarflexion         ?Ankle inversion         ?Ankle eversion         ?(Blank rows = not tested) ?  ?  ?  ?FUNCTIONAL TESTS:  ?01/10/2022:  TUG:  17 seconds c FWW    25.61 seconds c SPC ? ?12/27/2021:  TUG:  22.17 seconds c FWW   27.9 seconds c SPC ?12/11/2021:   TUG: 31 seconds c FWW ?  ?GAIT: ?01/10/2022:  Able to perform ambulation c SPC in Rt UE within clinic. Reduced gait speed noted c reduced step length bilateral and wider base of support noted.  ? ? ?12/11/2021: ?Distance walked: household distances in clinic < 150 ft ?Assistive device utilized: Environmental consultant - 2 wheeled - FWW ?Level of assistance: Modified independence ?Comments: reduce gait speed, shortened stride bilateral c reduce stance on Lt ?  ?  ?  ?TODAY'S TREATMENT: ?02/05/2022 ?Therapeutic Exercises: ?Nu-Step 8 minutes Level 8 LEs only increasing pace 30 sec of ea minute >/= 50 spm  ?Sit to stand slow eccentrics 2 sets of 5 ?Leg Press Double leg 87# 15 X 2 sets slow eccentrics and stretch into hip flexion    Single leg (L) 43# 15X slow eccentrics and stretch into hip flexion ?Standing step up & down LLE 6" step BUE support 10 reps. ?Lateral step up LLE 10 reps with BUE support.  ?Standing functional hip extension x 10 counter behind pelvis with support at end range - hands to chair bottom raising overhead watching with eyes.  ?Neuromuscular Re-education: ?Foam beam Tandem balance eyes open 2 reps X 30 seconds each (B);  crossways stance feet together eyes closed 10sec 2 reps with supervision. ? ?Functional Activities:  Pt ambulated in clinic without assistive device with cues for fuller, long steps.  ? ?01/28/2022 ?Edema:  ?LLE: prox to ankle 30.8cm, mid calf 40.8cm; prox to knee 41.9cm ?RLE: prox to ankle 25.3cm, mid calf 31cm; prox to knee 41cm ?He has history of DVT.  ?PT sent message to Aaron Gonzalez, [place] who spoken to Dr. Ninfa Linden. Doppler ordered at [facility] today at 3:00pm ?PT educated  on signs of PE with need to call "911" pt verbalized understanding.   ? ?Therapeutic Exercises: ?Nu-Step 8 minutes Level 8 LEs only ?Sit to stand slow eccentrics 2 sets of 5 ?Leg Press Double leg 81# 10X 2 sets slow eccentrics and stretch into hip flexion ?Single leg (L) 37# 15X slow eccentrics and stretch into hip flexion ?Standing hip abduction x 15 bilateral c light single hand touch ? Standing hip extension x 15 bilateral with intermittent UE support ? ?Neuromuscular Re-education: ?Tandem balance: eyes open 2 reps X 20 seconds each (B);  feet together eyes closed with supervision. ? ?Functional Activities:  Pt ambulated in clinic without assistive device.  ? ?4/6/203 ?Therapeutic Exercises: ?Nu-Step 8 minutes Level 8 ?Hip hike in walker (alternating) 2 sets of 10 for 3 seconds ?Sit to stand slow eccentrics 2 sets of 5 ?Leg Press Double leg 75# 10X slow eccentrics and stretch into hip flexion ?Single leg (L) 37# 10X slow eccentrics and stretch into hip flexion ? ? ?Neuromuscular Re-education: ?Tandem balance: eyes open and closed 4X 20 seconds each (B) ?Dynamic HT gait in parallel bars 4 laps ? ?Functional Activities: Lateral heel taps for steps and curbs 10X B off 4 inch step ? ?  ?  ?PATIENT EDUCATION:  ?01/10/2022: ?Education details: HEP, POC ?Person educated: Patient ?Education method: Explanation, Demonstration, Verbal cues, and Handouts ?Education comprehension: verbalized understanding and returned demonstration ?  ? HOME EXERCISE PROGRAM: ? Access Code: [email or web address] ?URL: [email or web address] ?Date: 01/24/2022 ?Prepared by: Vista Mink ? ?Exercises ?- Supine Bridge  - 2 x daily - 7 x weekly - 2 sets - 10 reps - 2 hold ?- Single Knee to Chest Stretch (Mirrored)  - 2 x daily - 7 x weekly - 1 sets - 3-5 reps - 15 hold ?- Clamshell  - 1 x daily - 7 x weekly - 3 sets - 10 reps ?- Sit to Stand  - 1 x  daily - 7 x weekly - 3 sets - 10 reps ?- Standing Hip Abduction with Counter Support  - 1 x daily - 7 x weekly - 1-2 sets - 10 reps ?- Standing Hip Extension with Counter Support  - 1 x daily - 7 x weekly - 1-2

## 2022-02-07 ENCOUNTER — Other Ambulatory Visit: Payer: Self-pay

## 2022-02-07 ENCOUNTER — Encounter: Payer: Self-pay | Admitting: Rehabilitative and Restorative Service Providers"

## 2022-02-07 ENCOUNTER — Ambulatory Visit: Payer: Medicare Other | Admitting: Rehabilitative and Restorative Service Providers"

## 2022-02-07 DIAGNOSIS — R262 Difficulty in walking, not elsewhere classified: Secondary | ICD-10-CM | POA: Diagnosis not present

## 2022-02-07 DIAGNOSIS — M25552 Pain in left hip: Secondary | ICD-10-CM

## 2022-02-07 DIAGNOSIS — M6281 Muscle weakness (generalized): Secondary | ICD-10-CM

## 2022-02-07 NOTE — Therapy (Signed)
?OUTPATIENT PHYSICAL THERAPY TREATMENT NOTE ? ? ?Patient Name: Aaron Gonzalez ?MRN: [id number] ?DOB:September 21, 1946, 76 y.o., male ?Today's Date: 02/07/2022 ? ?PCP: [name], MD ?REFERRING PROVIDER: Mcarthur Rossetti ? ? PT End of Session - 02/07/22 1037   ? ? Visit Number 17   ? Number of Visits 20   ? Date for PT Re-Evaluation 02/19/22   ? Authorization Type [employer] Medicare   ? Progress Note Due on Visit 20   ? PT Start Time 1009   ? PT Stop Time 5284   ? PT Time Calculation (min) 40 min   ? Activity Tolerance Patient tolerated treatment well   ? Behavior During Therapy [facility] for tasks assessed/performed   ? ?  ?  ? ?  ? ? ? ? ? ? ? ? ? ? ? ?Past Medical History:  ?Diagnosis Date  ? Arthritis   ? CLL (chronic lymphocytic leukemia) ([place])   ? DVT (deep venous thrombosis) ([place])   ? History of rectal cancer   ? Hypertension   ? Stroke [facility])   ? ?Past Surgical History:  ?Procedure Laterality Date  ? COLECTOMY    ? with colostomy  ? TOTAL HIP ARTHROPLASTY Left 10/26/2021  ? Procedure: Left TOTAL HIP ARTHROPLASTY ANTERIOR APPROACH;  Surgeon: Mcarthur Rossetti, MD;  Location: [facility];  Service: Orthopedics;  Laterality: Left;  RNFA  ? ?Patient Active Problem List  ? Diagnosis Date Noted  ? Status post left hip replacement 10/26/2021  ? Unilateral primary osteoarthritis, left hip 08/16/2021  ? Bilateral primary osteoarthritis of hip 08/09/2021  ? Hypertension 08/01/2021  ? Small vessel disease, cerebrovascular 06/23/2021  ? CLL (chronic lymphocytic leukemia) ([place]) 04/16/2021  ? History of stroke 04/16/2021  ? History of rectal cancer 04/16/2021  ? Encounter for antineoplastic chemotherapy 04/16/2021  ? Chronic leukemia ([place]) 04/03/2021  ? Edema of left lower extremity 04/03/2021  ? History of hemorrhagic cerebrovascular accident (CVA) with residual deficit 04/03/2021  ? Acute metabolic encephalopathy 13/24/4010  ? ? ?REFERRING DIAG: [id number] (ICD-10-CM) - Status post left hip replacement ? ?ONSET DATE: Surgery  10/26/2021 (Lt anterior THA) ? ?THERAPY DIAG:  ?Pain in left hip ? ?Muscle weakness (generalized) ? ?Difficulty in walking, not elsewhere classified ? ?PERTINENT HISTORY: History of DVT, HTN, stroke, rectal cancer ? ?PRECAUTIONS: Anterior hip ? ?SUBJECTIVE: US showed no DVT. His sister went on cruise & he was home alone with no issues.  ? ?PAIN:  ?Are you having pain?   No  ?NPRS scale:  ?Pain location: hip ?Pain orientation: Lt ?PAIN TYPE: surgery ?Pain description:  ?Aggravating factors:  ?Relieving factors:  ? ?OBJECTIVE:  ?   ?PATIENT SURVEYS:  ?01/17/2022: FOTO update : 58 (75 was assessed on 01/10/2022 but had false answers such as being able to run, walk a mile with medium to mild difficulty - Pt has been unable to run for example) ?12/11/2021: FOTO intake:  55 predicted: 69       ?          ?SENSATION: ?12/11/2021: Light touch: Appears intact ?          ?POSTURE:  ?12/11/2021: rounded shoulder, increased forward trunk lean, forward head posture ?  ?PALPATION: ?12/11/2021: mild tenderness anterior/lateral Lt hip grossly (trigger points noted in lateral hip).  ?  ?LE AROM/PROM: ?  ?A(PROM) Right ?12/11/21 Left ?12/11/21 Left ?01/10/2022  ?Hip flexion A 75 (P 90) in supine A 65(P 75) in supine AROM in supine: ?70  ?Hip extension       ?  Hip abduction       ?Hip adduction       ?Hip internal rotation PROM in 70 deg knee flexion supine: 25 PROM in 70 deg knee flexion supine:  20 PROM in 70 deg knee flexion supine: 45  ?Hip external rotation PROM in 70 deg knee flexion supine:  35 PROM in 70 deg knee flexion supine:  30 PROM in 70 deg knee flexion supine: 45  ?Knee flexion       ?Knee extension       ?Ankle dorsiflexion       ?Ankle plantarflexion       ?Ankle inversion       ?Ankle eversion       ?(Blank rows = not tested) ?  ?LE MMT: ?  ?MMT Right ?12/11/21 Left ?12/11/21 Left  ?12/18/21 Left ?01/03/2022 Left ?01/10/2022  ?Hip flexion 5/5 4/5 4+/5 5/5 5/5  ?Hip extension         ?Hip abduction   2/5   3/5  ?Hip adduction          ?Hip internal rotation         ?Hip external rotation         ?Knee flexion 5/5 5/5   5/5  ?Knee extension 5/5 5/5   5/5  ?Ankle dorsiflexion 5/5 5/5   5/5  ?Ankle plantarflexion         ?Ankle inversion         ?Ankle eversion         ?(Blank rows = not tested) ?  ?  ?  ?FUNCTIONAL TESTS:  ?01/10/2022:  TUG:  17 seconds c FWW    25.61 seconds c SPC ? ?12/27/2021:  TUG:  22.17 seconds c FWW   27.9 seconds c SPC ?12/11/2021:   TUG: 31 seconds c FWW ?  ?GAIT: ?01/10/2022:  Able to perform ambulation c SPC in Rt UE within clinic. Reduced gait speed noted c reduced step length bilateral and wider base of support noted.  ? ? ?12/11/2021: ?Distance walked: household distances in clinic < 150 ft ?Assistive device utilized: Environmental consultant - 2 wheeled - FWW ?Level of assistance: Modified independence ?Comments: reduce gait speed, shortened stride bilateral c reduce stance on Lt ?  ?  ?  ?TODAY'S TREATMENT: ?02/07/2022 ?Therapeutic Exercises: ?Nu-Step 10 minutes Level 7 UE/LE ?Seated blue band clam shells 20 x bilateral  ? ?TherActivity ? CGA c verbal cues for sequencing with SPC in Rt UE in up/down ramp and curb (6 inch) x3 each, stepping over foam on floor 2 times 6 trials c Lt leg leading and cone slalom 4 cones x 8 fwd ?Leg Press Double leg 87# x15 slow eccentrics and stretch into hip flexion    Single leg (L) 43# 15X slow eccentrics and stretch into hip flexion ?Sit to stand slow eccentrics 2 sets of 5 18 inch chair  (UE required for standing) ?  ? ?02/05/2022 ?Therapeutic Exercises: ?Nu-Step 8 minutes Level 8 LEs only increasing pace 30 sec of ea minute >/= 50 spm  ?Sit to stand slow eccentrics 2 sets of 5 ?Leg Press Double leg 87# 15 X 2 sets slow eccentrics and stretch into hip flexion    Single leg (L) 43# 15X slow eccentrics and stretch into hip flexion ?Standing step up & down LLE 6" step BUE support 10 reps. ?Lateral step up LLE 10 reps with BUE support.  ?Standing functional hip extension x 10 counter behind pelvis with support  at end range -  hands to chair bottom raising overhead watching with eyes.  ?Neuromuscular Re-education: ?Foam beam Tandem balance eyes open 2 reps X 30 seconds each (B);  crossways stance feet together eyes closed 10sec 2 reps with supervision. ? ?Functional Activities:  Pt ambulated in clinic without assistive device with cues for fuller, long steps.  ? ?01/28/2022 ?Edema:  ?LLE: prox to ankle 30.8cm, mid calf 40.8cm; prox to knee 41.9cm ?RLE: prox to ankle 25.3cm, mid calf 31cm; prox to knee 41cm ?He has history of DVT.  ?PT sent message to Anise Salvo, [place] who spoken to Dr. Ninfa Linden. Doppler ordered at [facility] today at 3:00pm ?PT educated on signs of PE with need to call "[phone]" pt verbalized understanding.   ? ?Therapeutic Exercises: ?Nu-Step 8 minutes Level 8 LEs only ?Sit to stand slow eccentrics 2 sets of 5 ?Leg Press Double leg 81# 10X 2 sets slow eccentrics and stretch into hip flexion ?Single leg (L) 37# 15X slow eccentrics and stretch into hip flexion ?Standing hip abduction x 15 bilateral c light single hand touch ? Standing hip extension x 15 bilateral with intermittent UE support ? ?Neuromuscular Re-education: ?Tandem balance: eyes open 2 reps X 20 seconds each (B);  feet together eyes closed with supervision. ? ?Functional Activities:  Pt ambulated in clinic without assistive device.  ? ? ?  ?PATIENT EDUCATION:  ?01/10/2022: ?Education details: HEP, POC ?Person educated: Patient ?Education method: Explanation, Demonstration, Verbal cues, and Handouts ?Education comprehension: verbalized understanding and returned demonstration ?  ? HOME EXERCISE PROGRAM: ? Access Code: [email or web address] ?URL: [email or web address] ?Date: 01/24/2022 ?Prepared by: Vista Mink ? ?Exercises ?- Supine Bridge  - 2 x daily - 7 x weekly - 2 sets - 10 reps - 2 hold ?- Single Knee to Chest Stretch (Mirrored)  - 2 x daily - 7 x weekly - 1 sets - 3-5 reps - 15 hold ?- Clamshell  - 1 x daily - 7 x weekly - 3 sets - 10  reps ?- Sit to Stand  - 1 x daily - 7 x weekly - 3 sets - 10 reps ?- Standing Hip Abduction with Counter Support  - 1 x daily - 7 x weekly - 1-2 sets - 10 reps ?- Standing Hip Extension with Counter Support  - 1

## 2022-02-12 ENCOUNTER — Ambulatory Visit: Payer: Medicare Other | Admitting: Physical Therapy

## 2022-02-12 ENCOUNTER — Encounter: Payer: Self-pay | Admitting: Physical Therapy

## 2022-02-12 ENCOUNTER — Other Ambulatory Visit: Payer: Self-pay

## 2022-02-12 DIAGNOSIS — M6281 Muscle weakness (generalized): Secondary | ICD-10-CM

## 2022-02-12 DIAGNOSIS — M25552 Pain in left hip: Secondary | ICD-10-CM | POA: Diagnosis not present

## 2022-02-12 DIAGNOSIS — R262 Difficulty in walking, not elsewhere classified: Secondary | ICD-10-CM

## 2022-02-12 NOTE — Therapy (Signed)
?OUTPATIENT PHYSICAL THERAPY TREATMENT NOTE ? ? ?Patient Name: Aaron Gonzalez ?MRN: [id number] ?DOB:1946/01/15, 76 y.o., male ?Today's Date: 02/12/2022 ? ?PCP: [name], MD ?REFERRING PROVIDER: Mcarthur Rossetti ? ? PT End of Session - 02/12/22 1257   ? ? Visit Number 18   ? Number of Visits 20   ? Date for PT Re-Evaluation 02/19/22   ? Authorization Type [employer] Medicare   ? Progress Note Due on Visit 20   ? PT Start Time 7741   ? PT Stop Time 2878   ? PT Time Calculation (min) 48 min   ? Activity Tolerance Patient tolerated treatment well   ? Behavior During Therapy [facility] for tasks assessed/performed   ? ?  ?  ? ?  ? ? ? ? ? ? ? ? ? ? ? ? ?Past Medical History:  ?Diagnosis Date  ? Arthritis   ? CLL (chronic lymphocytic leukemia) ([place])   ? DVT (deep venous thrombosis) ([place])   ? History of rectal cancer   ? Hypertension   ? Stroke [facility])   ? ?Past Surgical History:  ?Procedure Laterality Date  ? COLECTOMY    ? with colostomy  ? TOTAL HIP ARTHROPLASTY Left 10/26/2021  ? Procedure: Left TOTAL HIP ARTHROPLASTY ANTERIOR APPROACH;  Surgeon: Mcarthur Rossetti, MD;  Location: [facility];  Service: Orthopedics;  Laterality: Left;  RNFA  ? ?Patient Active Problem List  ? Diagnosis Date Noted  ? Status post left hip replacement 10/26/2021  ? Unilateral primary osteoarthritis, left hip 08/16/2021  ? Bilateral primary osteoarthritis of hip 08/09/2021  ? Hypertension 08/01/2021  ? Small vessel disease, cerebrovascular 06/23/2021  ? CLL (chronic lymphocytic leukemia) ([place]) 04/16/2021  ? History of stroke 04/16/2021  ? History of rectal cancer 04/16/2021  ? Encounter for antineoplastic chemotherapy 04/16/2021  ? Chronic leukemia ([place]) 04/03/2021  ? Edema of left lower extremity 04/03/2021  ? History of hemorrhagic cerebrovascular accident (CVA) with residual deficit 04/03/2021  ? Acute metabolic encephalopathy 67/67/2094  ? ? ?REFERRING DIAG: [id number] (ICD-10-CM) - Status post left hip replacement ? ?ONSET DATE: Surgery  10/26/2021 (Lt anterior THA) ? ?THERAPY DIAG:  ?Pain in left hip ? ?Muscle weakness (generalized) ? ?Difficulty in walking, not elsewhere classified ? ?PERTINENT HISTORY: History of DVT, HTN, stroke, rectal cancer ? ?PRECAUTIONS: Anterior hip ? ?SUBJECTIVE: He has been doing his exercises. He is walking in home with cane or no device.  ? ?PAIN:  ?Are you having pain?  No  ?NPRS scale:  ?Pain location: hip ?Pain orientation: Lt ?PAIN TYPE: surgery ?Pain description:  ?Aggravating factors:  ?Relieving factors:  ? ?OBJECTIVE:  ?   ?PATIENT SURVEYS:  ?01/17/2022: FOTO update : 58 (75 was assessed on 01/10/2022 but had false answers such as being able to run, walk a mile with medium to mild difficulty - Pt has been unable to run for example) ?12/11/2021: FOTO intake:  55 predicted: 69       ?          ?SENSATION: ?12/11/2021: Light touch: Appears intact ?          ?POSTURE:  ?12/11/2021: rounded shoulder, increased forward trunk lean, forward head posture ?  ?PALPATION: ?12/11/2021: mild tenderness anterior/lateral Lt hip grossly (trigger points noted in lateral hip).  ?  ?LE AROM/PROM: ?  ?A(PROM) Right ?12/11/21 Left ?12/11/21 Left ?01/10/2022  ?Hip flexion A 75 (P 90) in supine A 65(P 75) in supine AROM in supine: ?70  ?Hip extension       ?  Hip abduction       ?Hip adduction       ?Hip internal rotation PROM in 70 deg knee flexion supine: 25 PROM in 70 deg knee flexion supine:  20 PROM in 70 deg knee flexion supine: 45  ?Hip external rotation PROM in 70 deg knee flexion supine:  35 PROM in 70 deg knee flexion supine:  30 PROM in 70 deg knee flexion supine: 45  ?Knee flexion       ?Knee extension       ?Ankle dorsiflexion       ?Ankle plantarflexion       ?Ankle inversion       ?Ankle eversion       ?(Blank rows = not tested) ?  ?LE MMT: ?  ?MMT Right ?12/11/21 Left ?12/11/21 Left  ?12/18/21 Left ?01/03/2022 Left ?01/10/2022  ?Hip flexion 5/5 4/5 4+/5 5/5 5/5  ?Hip extension         ?Hip abduction   2/5   3/5  ?Hip adduction          ?Hip internal rotation         ?Hip external rotation         ?Knee flexion 5/5 5/5   5/5  ?Knee extension 5/5 5/5   5/5  ?Ankle dorsiflexion 5/5 5/5   5/5  ?Ankle plantarflexion         ?Ankle inversion         ?Ankle eversion         ?(Blank rows = not tested) ?  ?  ?  ?FUNCTIONAL TESTS:  ?01/10/2022:  TUG:  17 seconds c FWW    25.61 seconds c SPC ? ?12/27/2021:  TUG:  22.17 seconds c FWW   27.9 seconds c SPC ?12/11/2021:   TUG: 31 seconds c FWW ?  ?GAIT: ?01/10/2022:  Able to perform ambulation c SPC in Rt UE within clinic. Reduced gait speed noted c reduced step length bilateral and wider base of support noted.  ? ? ?12/11/2021: ?Distance walked: household distances in clinic < 150 ft ?Assistive device utilized: Environmental consultant - 2 wheeled - FWW ?Level of assistance: Modified independence ?Comments: reduce gait speed, shortened stride bilateral c reduce stance on Lt ?  ?  ?  ?TODAY'S TREATMENT: ?02/12/2022 ?Therapeutic Exercises: ?Nu-Step 10 minutes Level 6 LEs only increasing pace to >50spm 30sec of ea min. ?Hamstring stretch LLE SLR c strap 30 sec hold 2 reps.  ? ?TherActivity ? Supervision with SPC in Rt UE in up/down ramp and curb (6 inch) x3 each,  ? Pt amb in clinic without AD with cues to increase pace & longer steps with supervision.  ?stepping  on foam beam and contralateral LE stepping over beam 10 reps ea LE ?Leg Press Double leg 87# x15 slow eccentrics and stretch into hip flexion    Single leg (L) 50# 15X slow eccentrics and stretch into hip flexion ?Sit to/from stand without UE support 18 inch chair PT demo & verbal cues on technique ? ?Neuromuscular Re-education: ?Foam beam crossways stance feet together eyes open with head motions up/down, right/left and diagonals.   ?Fitter with single pivot point BLEs with light BUE support & mirror for feedback ant/post, right/left & circles 10 reps ea. PT demo & verbal cues on technique ?Stepping back (terminal stance) and forward (initial contact) with BLEs green theraband  around knees BLEs 10 reps ea ?Sidestepping with green theraband around knees 8' X 2 ? ? ?02/07/2022 ?Therapeutic Exercises: ?Nu-Step 10  minutes Level 7 UE/LE ?Seated blue band clam shells 20 x bilateral  ? ?TherActivity ? CGA c verbal cues for sequencing with SPC in Rt UE in up/down ramp and curb (6 inch) x3 each, stepping over foam on floor 2 times 6 trials c Lt leg leading and cone slalom 4 cones x 8 fwd ?Leg Press Double leg 87# x15 slow eccentrics and stretch into hip flexion    Single leg (L) 43# 15X slow eccentrics and stretch into hip flexion ?Sit to stand slow eccentrics 2 sets of 5 18 inch chair  (UE required for standing) ?  ? ?02/05/2022 ?Therapeutic Exercises: ?Nu-Step 8 minutes Level 8 LEs only increasing pace 30 sec of ea minute >/= 50 spm  ?Sit to stand slow eccentrics 2 sets of 5 ?Leg Press Double leg 87# 15 X 2 sets slow eccentrics and stretch into hip flexion    Single leg (L) 43# 15X slow eccentrics and stretch into hip flexion ?Standing step up & down LLE 6" step BUE support 10 reps. ?Lateral step up LLE 10 reps with BUE support.  ?Standing functional hip extension x 10 counter behind pelvis with support at end range - hands to chair bottom raising overhead watching with eyes.  ?Neuromuscular Re-education: ?Foam beam Tandem balance eyes open 2 reps X 30 seconds each (B);  crossways stance feet together eyes closed 10sec 2 reps with supervision. ? ?Functional Activities:  Pt ambulated in clinic without assistive device with cues for fuller, long steps.  ? ? ?  ?PATIENT EDUCATION:  ?01/10/2022: ?Education details: HEP, POC ?Person educated: Patient ?Education method: Explanation, Demonstration, Verbal cues, and Handouts ?Education comprehension: verbalized understanding and returned demonstration ?  ? HOME EXERCISE PROGRAM: ? Access Code: [email or web address] ?URL: [email or web address] ?Date: 01/24/2022 ?Prepared by: Vista Mink ? ?Exercises ?- Supine Bridge  - 2 x daily - 7 x weekly - 2 sets -  10 reps - 2 hold ?- Single Knee to Chest Stretch (Mirrored)  - 2 x daily - 7 x weekly - 1 sets - 3-5 reps - 15 hold ?- Clamshell  - 1 x daily - 7 x weekly - 3 sets - 10 reps ?- Sit to Stand  - 1 x daily - 7

## 2022-02-14 ENCOUNTER — Ambulatory Visit: Payer: Medicare Other | Admitting: Physical Therapy

## 2022-02-14 ENCOUNTER — Other Ambulatory Visit: Payer: Self-pay

## 2022-02-14 ENCOUNTER — Encounter: Payer: Self-pay | Admitting: Physical Therapy

## 2022-02-14 DIAGNOSIS — M6281 Muscle weakness (generalized): Secondary | ICD-10-CM

## 2022-02-14 DIAGNOSIS — R262 Difficulty in walking, not elsewhere classified: Secondary | ICD-10-CM

## 2022-02-14 DIAGNOSIS — M25552 Pain in left hip: Secondary | ICD-10-CM | POA: Diagnosis not present

## 2022-02-14 NOTE — Therapy (Signed)
?OUTPATIENT PHYSICAL THERAPY TREATMENT NOTE ? ? ?Patient Name: Aaron Gonzalez ?MRN: [id number] ?DOB:02-01-1946, 76 y.o., male ?Today's Date: 02/14/2022 ? ?PCP: [name], MD ?REFERRING PROVIDER: Mcarthur Rossetti ? ? PT End of Session - 02/14/22 1258   ? ? Visit Number 19   ? Number of Visits 20   ? Date for PT Re-Evaluation 02/19/22   ? Authorization Type [employer] Medicare   ? Progress Note Due on Visit 20   ? PT Start Time 1258   ? PT Stop Time 2229   ? PT Time Calculation (min) 47 min   ? Activity Tolerance Patient tolerated treatment well   ? Behavior During Therapy [facility] for tasks assessed/performed   ? ?  ?  ? ?  ? ? ? ? ? ? ? ? ? ? ? ? ? ?Past Medical History:  ?Diagnosis Date  ? Arthritis   ? CLL (chronic lymphocytic leukemia) ([place])   ? DVT (deep venous thrombosis) ([place])   ? History of rectal cancer   ? Hypertension   ? Stroke [facility])   ? ?Past Surgical History:  ?Procedure Laterality Date  ? COLECTOMY    ? with colostomy  ? TOTAL HIP ARTHROPLASTY Left 10/26/2021  ? Procedure: Left TOTAL HIP ARTHROPLASTY ANTERIOR APPROACH;  Surgeon: Mcarthur Rossetti, MD;  Location: [facility];  Service: Orthopedics;  Laterality: Left;  RNFA  ? ?Patient Active Problem List  ? Diagnosis Date Noted  ? Status post left hip replacement 10/26/2021  ? Unilateral primary osteoarthritis, left hip 08/16/2021  ? Bilateral primary osteoarthritis of hip 08/09/2021  ? Hypertension 08/01/2021  ? Small vessel disease, cerebrovascular 06/23/2021  ? CLL (chronic lymphocytic leukemia) ([place]) 04/16/2021  ? History of stroke 04/16/2021  ? History of rectal cancer 04/16/2021  ? Encounter for antineoplastic chemotherapy 04/16/2021  ? Chronic leukemia ([place]) 04/03/2021  ? Edema of left lower extremity 04/03/2021  ? History of hemorrhagic cerebrovascular accident (CVA) with residual deficit 04/03/2021  ? Acute metabolic encephalopathy 79/89/2119  ? ? ?REFERRING DIAG: [id number] (ICD-10-CM) - Status post left hip replacement ? ?ONSET DATE: Surgery  10/26/2021 (Lt anterior THA) ? ?THERAPY DIAG:  ?Pain in left hip ? ?Muscle weakness (generalized) ? ?Difficulty in walking, not elsewhere classified ? ?PERTINENT HISTORY: History of DVT, HTN, stroke, rectal cancer ? ?PRECAUTIONS: Anterior hip ? ?SUBJECTIVE: He still has not tried to use cane outside of house.  ? ?PAIN:  ?Are you having pain?  No  ?NPRS scale:  ?Pain location: hip ?Pain orientation: Lt ?PAIN TYPE: surgery ?Pain description:  ?Aggravating factors:  ?Relieving factors:  ? ?OBJECTIVE:  ?   ?PATIENT SURVEYS:  ?01/17/2022: FOTO update : 58 (75 was assessed on 01/10/2022 but had false answers such as being able to run, walk a mile with medium to mild difficulty - Pt has been unable to run for example) ?12/11/2021: FOTO intake:  55 predicted: 69       ?          ?SENSATION: ?12/11/2021: Light touch: Appears intact ?          ?POSTURE:  ?12/11/2021: rounded shoulder, increased forward trunk lean, forward head posture ?  ?PALPATION: ?12/11/2021: mild tenderness anterior/lateral Lt hip grossly (trigger points noted in lateral hip).  ?  ?LE AROM/PROM: ?  ?A(PROM) Right ?12/11/21 Left ?12/11/21 Left ?01/10/2022  ?Hip flexion A 75 (P 90) in supine A 65(P 75) in supine AROM in supine: ?70  ?Hip extension       ?Hip  abduction       ?Hip adduction       ?Hip internal rotation PROM in 70 deg knee flexion supine: 25 PROM in 70 deg knee flexion supine:  20 PROM in 70 deg knee flexion supine: 45  ?Hip external rotation PROM in 70 deg knee flexion supine:  35 PROM in 70 deg knee flexion supine:  30 PROM in 70 deg knee flexion supine: 45  ?Knee flexion       ?Knee extension       ?Ankle dorsiflexion       ?Ankle plantarflexion       ?Ankle inversion       ?Ankle eversion       ?(Blank rows = not tested) ?  ?LE MMT: ?  ?MMT Right ?12/11/21 Left ?12/11/21 Left  ?12/18/21 Left ?01/03/2022 Left ?01/10/2022  ?Hip flexion 5/5 4/5 4+/5 5/5 5/5  ?Hip extension         ?Hip abduction   2/5   3/5  ?Hip adduction         ?Hip internal rotation          ?Hip external rotation         ?Knee flexion 5/5 5/5   5/5  ?Knee extension 5/5 5/5   5/5  ?Ankle dorsiflexion 5/5 5/5   5/5  ?Ankle plantarflexion         ?Ankle inversion         ?Ankle eversion         ?(Blank rows = not tested) ?  ?  ?  ?FUNCTIONAL TESTS:  ?01/10/2022:  TUG:  17 seconds c FWW    25.61 seconds c SPC ? ?12/27/2021:  TUG:  22.17 seconds c FWW   27.9 seconds c SPC ?12/11/2021:   TUG: 31 seconds c FWW ?  ?GAIT: ?01/10/2022:  Able to perform ambulation c SPC in Rt UE within clinic. Reduced gait speed noted c reduced step length bilateral and wider base of support noted.  ? ? ?12/11/2021: ?Distance walked: household distances in clinic < 150 ft ?Assistive device utilized: Environmental consultant - 2 wheeled - FWW ?Level of assistance: Modified independence ?Comments: reduce gait speed, shortened stride bilateral c reduce stance on Lt ?  ?  ?  ?TODAY'S TREATMENT: ?02/14/2022 ?Therapeutic Exercises: ?Nu-Step 10 minutes Level 6 LEs only increasing pace to >50spm 30sec of ea min. Pt reports his legs no longer burn like they did when first started picking up pace.  ?Hamstring stretch LLE SLR c strap 30 sec hold 2 reps.  ?Gastroc stretch incline board 30 sec 2 reps ?Bil. Heel raises on incline board 15 reps ? ?TherActivity ? Supervision with SPC in Rt UE in up/down ramp and curb (6 inch) ? Pt amb in clinic without AD with cues to increase pace & longer steps with supervision. ?Pt amb 320' with cane stand alone tip modified independent with ability to be distracted by conversation without any issues.   ?stepping  on foam beam and contralateral LE stepping over beam 10 reps ea LE ?Leg Press Double leg 87# x1 5 slow eccentrics and stretch into hip flexion    Single leg (L) 50# 15X slow eccentrics and stretch into hip flexion ?Sit to/from stand without UE support 18 inch chair PT demo & verbal cues on technique ? ?Neuromuscular Re-education: ?Foam beam crossways stance feet together eyes open with head motions up/down, right/left  and diagonals and static eyes closed 10 sec 2 reps.  ?Stance LE on foam  beam & contralateral LE stepping over / back with single UE support 10 reps ea LE.  ? ?02/12/2022 ?Therapeutic Exercises: ?Nu-Step 10 minutes Level 6 LEs only increasing pace to >50spm 30sec of ea min. ?Hamstring stretch LLE SLR c strap 30 sec hold 2 reps.  ? ?TherActivity ? Supervision with SPC in Rt UE in up/down ramp and curb (6 inch) x3 each,  ? Pt amb in clinic without AD with cues to increase pace & longer steps with supervision.  ?stepping  on foam beam and contralateral LE stepping over beam 10 reps ea LE ?Leg Press Double leg 87# x15 slow eccentrics and stretch into hip flexion    Single leg (L) 50# 15X slow eccentrics and stretch into hip flexion ?Sit to/from stand without UE support 18 inch chair PT demo & verbal cues on technique ? ?Neuromuscular Re-education: ?Foam beam crossways stance feet together eyes open with head motions up/down, right/left and diagonals.   ?Fitter with single pivot point BLEs with light BUE support & mirror for feedback ant/post, right/left & circles 10 reps ea. PT demo & verbal cues on technique ?Stepping back (terminal stance) and forward (initial contact) with BLEs green theraband around knees BLEs 10 reps ea ?Sidestepping with green theraband around knees 8' X 2 ? ? ?02/07/2022 ?Therapeutic Exercises: ?Nu-Step 10 minutes Level 7 UE/LE ?Seated blue band clam shells 20 x bilateral  ? ?TherActivity ? CGA c verbal cues for sequencing with SPC in Rt UE in up/down ramp and curb (6 inch) x3 each, stepping over foam on floor 2 times 6 trials c Lt leg leading and cone slalom 4 cones x 8 fwd ?Leg Press Double leg 87# x15 slow eccentrics and stretch into hip flexion    Single leg (L) 43# 15X slow eccentrics and stretch into hip flexion ?Sit to stand slow eccentrics 2 sets of 5 18 inch chair  (UE required for standing) ?  ? ? ?  ?PATIENT EDUCATION:  ?01/10/2022: ?Education details: HEP, POC ?Person educated:  Patient ?Education method: Explanation, Demonstration, Verbal cues, and Handouts ?Education comprehension: verbalized understanding and returned demonstration ?  ? HOME EXERCISE PROGRAM: ? Access Code: [email or web address] ?UR

## 2022-02-19 ENCOUNTER — Ambulatory Visit: Payer: Medicare Other | Admitting: Rehabilitative and Restorative Service Providers"

## 2022-02-19 ENCOUNTER — Other Ambulatory Visit: Payer: Self-pay

## 2022-02-19 ENCOUNTER — Encounter: Payer: Self-pay | Admitting: Rehabilitative and Restorative Service Providers"

## 2022-02-19 DIAGNOSIS — R262 Difficulty in walking, not elsewhere classified: Secondary | ICD-10-CM | POA: Diagnosis not present

## 2022-02-19 DIAGNOSIS — M6281 Muscle weakness (generalized): Secondary | ICD-10-CM

## 2022-02-19 DIAGNOSIS — M25552 Pain in left hip: Secondary | ICD-10-CM | POA: Diagnosis not present

## 2022-02-19 NOTE — Therapy (Signed)
?OUTPATIENT PHYSICAL THERAPY TREATMENT NOTE /DISCHARGE ? ? ?Patient Name: Aaron Gonzalez ?MRN: [id number] ?DOB:03-17-1946, 76 y.o., male ?Today's Date: 02/19/2022 ? ?PCP: [name], MD ?REFERRING PROVIDER: Mcarthur Rossetti ? ? PT End of Session - 02/19/22 1303   ? ? Visit Number 20   ? Number of Visits 20   ? Date for PT Re-Evaluation 02/19/22   ? Authorization Type [employer] Medicare   ? Progress Note Due on Visit 20   ? PT Start Time 1259   ? PT Stop Time 8101   ? PT Time Calculation (min) 28 min   ? Activity Tolerance Patient tolerated treatment well   ? Behavior During Therapy [facility] for tasks assessed/performed   ? ?  ?  ? ?  ? ? ? ? ? ? ? ? ? ? ? ? ? ? ?Past Medical History:  ?Diagnosis Date  ? Arthritis   ? CLL (chronic lymphocytic leukemia) ([place])   ? DVT (deep venous thrombosis) ([place])   ? History of rectal cancer   ? Hypertension   ? Stroke [facility])   ? ?Past Surgical History:  ?Procedure Laterality Date  ? COLECTOMY    ? with colostomy  ? TOTAL HIP ARTHROPLASTY Left 10/26/2021  ? Procedure: Left TOTAL HIP ARTHROPLASTY ANTERIOR APPROACH;  Surgeon: Mcarthur Rossetti, MD;  Location: [facility];  Service: Orthopedics;  Laterality: Left;  RNFA  ? ?Patient Active Problem List  ? Diagnosis Date Noted  ? Status post left hip replacement 10/26/2021  ? Unilateral primary osteoarthritis, left hip 08/16/2021  ? Bilateral primary osteoarthritis of hip 08/09/2021  ? Hypertension 08/01/2021  ? Small vessel disease, cerebrovascular 06/23/2021  ? CLL (chronic lymphocytic leukemia) ([place]) 04/16/2021  ? History of stroke 04/16/2021  ? History of rectal cancer 04/16/2021  ? Encounter for antineoplastic chemotherapy 04/16/2021  ? Chronic leukemia ([place]) 04/03/2021  ? Edema of left lower extremity 04/03/2021  ? History of hemorrhagic cerebrovascular accident (CVA) with residual deficit 04/03/2021  ? Acute metabolic encephalopathy 75/07/2584  ? ? ?REFERRING DIAG: [id number] (ICD-10-CM) - Status post left hip replacement ? ?ONSET DATE:  Surgery 10/26/2021 (Lt anterior THA) ? ?THERAPY DIAG:  ?Pain in left hip ? ?Muscle weakness (generalized) ? ?Difficulty in walking, not elsewhere classified ? ?PERTINENT HISTORY: History of DVT, HTN, stroke, rectal cancer ? ?PRECAUTIONS: Anterior hip ? ?SUBJECTIVE: Pt indicated no pain to report in last week or two. Global Rating of Change +7 at this time.  ? ?PAIN:  ?Are you having pain?  No  ?NPRS scale:  ?Pain location: hip ?Pain orientation: Lt ?PAIN TYPE: surgery ?Pain description:  ?Aggravating factors:  ?Relieving factors:  ? ?OBJECTIVE:  ?   ?PATIENT SURVEYS:  ? ?01/17/2022: FOTO update : 58 (75 was assessed on 01/10/2022 but had false answers such as being able to run, walk a mile with medium to mild difficulty - Pt has been unable to run for example) ?12/11/2021: FOTO intake:  55 predicted: 69       ?          ?SENSATION: ?12/11/2021: Light touch: Appears intact ?          ?POSTURE:  ?12/11/2021: rounded shoulder, increased forward trunk lean, forward head posture ?  ?PALPATION: ?12/11/2021: mild tenderness anterior/lateral Lt hip grossly (trigger points noted in lateral hip).  ?  ?LE AROM/PROM: ?  ?A(PROM) Right ?12/11/21 Left ?12/11/21 Left ?01/10/2022  ?Hip flexion A 75 (P 90) in supine A 65(P 75) in supine AROM in supine: ?  70  ?Hip extension       ?Hip abduction       ?Hip adduction       ?Hip internal rotation PROM in 70 deg knee flexion supine: 25 PROM in 70 deg knee flexion supine:  20 PROM in 70 deg knee flexion supine: 45  ?Hip external rotation PROM in 70 deg knee flexion supine:  35 PROM in 70 deg knee flexion supine:  30 PROM in 70 deg knee flexion supine: 45  ?Knee flexion       ?Knee extension       ?Ankle dorsiflexion       ?Ankle plantarflexion       ?Ankle inversion       ?Ankle eversion       ?(Blank rows = not tested) ?  ?LE MMT: ?  ?MMT Right ?12/11/21 Left ?12/11/21 Left  ?12/18/21 Left ?01/03/2022 Left ?01/10/2022 Left ?02/19/2022  ?Hip flexion 5/5 4/5 4+/5 5/5 5/5 5/5  ?Hip extension          ?Hip  abduction   2/5   3/5 3+/5  ?Hip adduction          ?Hip internal rotation          ?Hip external rotation          ?Knee flexion 5/5 5/5   5/5 5/5  ?Knee extension 5/5 5/5   5/5 5/5  ?Ankle dorsiflexion 5/5 5/5   5/5 5/5  ?Ankle plantarflexion          ?Ankle inversion          ?Ankle eversion          ?(Blank rows = not tested) ?  ?  ?  ?FUNCTIONAL TESTS:  ?02/19/2022: TUG: 17.5   c FWW.     27 Seconds c SPC ? ?01/10/2022:  TUG:  17 seconds c FWW    25.61 seconds c SPC ? ?12/27/2021:  TUG:  22.17 seconds c FWW   27.9 seconds c SPC ?12/11/2021:   TUG: 31 seconds c FWW ?  ?GAIT: ?02/19/2022: Ambulation in clinic c both FWW and SPC use safely.  Reduced gait speed with SPC use compared to FWW use.  Increased width of base of support noted with both(more noted with SPC use).  SPC use in Lt arm in clinic today.  ? ?01/10/2022:  Able to perform ambulation c SPC in Rt UE within clinic. Reduced gait speed noted c reduced step length bilateral and wider base of support noted.  ? ? ?12/11/2021: ?Distance walked: household distances in clinic < 150 ft ?Assistive device utilized: Environmental consultant - 2 wheeled - FWW ?Level of assistance: Modified independence ?Comments: reduce gait speed, shortened stride bilateral c reduce stance on Lt ?  ?  ?  ?TODAY'S TREATMENT: ? ?02/19/2022 ?Therapeutic Exercises: ?Nustep Lvl 6 12 mins ?Verbal review of existing HEP and plan for continued use at home.  Encouraged continued use of senior center c verbal cues for exercise routine.  ? ?TherActivity ? Supervision with SPC in Rt UE in up/down ramp and curb (6 inch) x 2  ? Pt amb in clinic with [facility] with discussion for mental distraction to challenge sequence and balance control.  ?18 inch chair sit to stand c slow lowering focus for improve control/strength for transfer : 2 x 5 ? ? ? ? ?02/14/2022 ?Therapeutic Exercises: ?Nu-Step 10 minutes Level 6 LEs only increasing pace to >50spm 30sec of ea min. Pt reports his legs no longer burn like  they did when first started  picking up pace.  ?Hamstring stretch LLE SLR c strap 30 sec hold 2 reps.  ?Gastroc stretch incline board 30 sec 2 reps ?Bil. Heel raises on incline board 15 reps ? ?TherActivity ? Supervision with SPC in Rt UE in up/down ramp and curb (6 inch) ? Pt amb in clinic without AD with cues to increase pace & longer steps with supervision. ?Pt amb 320' with cane stand alone tip modified independent with ability to be distracted by conversation without any issues.   ?stepping  on foam beam and contralateral LE stepping over beam 10 reps ea LE ?Leg Press Double leg 87# x1 5 slow eccentrics and stretch into hip flexion    Single leg (L) 50# 15X slow eccentrics and stretch into hip flexion ?Sit to/from stand without UE support 18 inch chair PT demo & verbal cues on technique ? ?Neuromuscular Re-education: ?Foam beam crossways stance feet together eyes open with head motions up/down, right/left and diagonals and static eyes closed 10 sec 2 reps.  ?Stance LE on foam beam & contralateral LE stepping over / back with single UE support 10 reps ea LE.  ? ?02/12/2022 ?Therapeutic Exercises: ?Nu-Step 10 minutes Level 6 LEs only increasing pace to >50spm 30sec of ea min. ?Hamstring stretch LLE SLR c strap 30 sec hold 2 reps.  ? ?TherActivity ? Supervision with SPC in Rt UE in up/down ramp and curb (6 inch) x3 each,  ? Pt amb in clinic without AD with cues to increase pace & longer steps with supervision.  ?stepping  on foam beam and contralateral LE stepping over beam 10 reps ea LE ?Leg Press Double leg 87# x15 slow eccentrics and stretch into hip flexion    Single leg (L) 50# 15X slow eccentrics and stretch into hip flexion ?Sit to/from stand without UE support 18 inch chair PT demo & verbal cues on technique ? ?Neuromuscular Re-education: ?Foam beam crossways stance feet together eyes open with head motions up/down, right/left and diagonals.   ?Fitter with single pivot point BLEs with light BUE support & mirror for feedback ant/post,  right/left & circles 10 reps ea. PT demo & verbal cues on technique ?Stepping back (terminal stance) and forward (initial contact) with BLEs green theraband around knees BLEs 10 reps ea ?Sidestepping with

## 2022-02-21 ENCOUNTER — Encounter: Payer: Medicare Other | Admitting: Rehabilitative and Restorative Service Providers"

## 2022-02-25 ENCOUNTER — Telehealth: Payer: Self-pay | Admitting: Internal Medicine

## 2022-02-25 NOTE — Telephone Encounter (Signed)
Called patient regarding upcoming appointment, patient is notified. °

## 2022-02-26 ENCOUNTER — Other Ambulatory Visit: Payer: Self-pay

## 2022-02-26 ENCOUNTER — Inpatient Hospital Stay: Payer: Medicare Other | Admitting: Internal Medicine

## 2022-02-26 ENCOUNTER — Inpatient Hospital Stay: Payer: Medicare Other | Attending: Internal Medicine

## 2022-02-26 VITALS — BP 133/82 | HR 57 | Temp 98.2°F | Resp 17 | Wt 164.5 lb

## 2022-02-26 DIAGNOSIS — Z86718 Personal history of other venous thrombosis and embolism: Secondary | ICD-10-CM | POA: Insufficient documentation

## 2022-02-26 DIAGNOSIS — C9110 Chronic lymphocytic leukemia of B-cell type not having achieved remission: Secondary | ICD-10-CM

## 2022-02-26 DIAGNOSIS — Z9221 Personal history of antineoplastic chemotherapy: Secondary | ICD-10-CM | POA: Insufficient documentation

## 2022-02-26 DIAGNOSIS — Z923 Personal history of irradiation: Secondary | ICD-10-CM | POA: Diagnosis not present

## 2022-02-26 DIAGNOSIS — Z85048 Personal history of other malignant neoplasm of rectum, rectosigmoid junction, and anus: Secondary | ICD-10-CM | POA: Insufficient documentation

## 2022-02-26 DIAGNOSIS — Z7982 Long term (current) use of aspirin: Secondary | ICD-10-CM | POA: Insufficient documentation

## 2022-02-26 DIAGNOSIS — Z79899 Other long term (current) drug therapy: Secondary | ICD-10-CM | POA: Insufficient documentation

## 2022-02-26 LAB — CMP (CANCER CENTER ONLY)
ALT: 16 U/L (ref 0–44)
AST: 25 U/L (ref 15–41)
Albumin: 3.8 g/dL (ref 3.5–5.0)
Alkaline Phosphatase: 46 U/L (ref 38–126)
Anion gap: 8 (ref 5–15)
BUN: 16 mg/dL (ref 8–23)
CO2: 28 mmol/L (ref 22–32)
Calcium: 9.1 mg/dL (ref 8.9–10.3)
Chloride: 106 mmol/L (ref 98–111)
Creatinine: 1.08 mg/dL (ref 0.61–1.24)
GFR, Estimated: >60 mL/min (ref >60)
Glucose, Bld: 76 mg/dL (ref 70–99)
Potassium: 3.6 mmol/L (ref 3.5–5.1)
Sodium: 142 mmol/L (ref 135–145)
Total Bilirubin: 1.2 mg/dL (ref 0.3–1.2)
Total Protein: 6.8 g/dL (ref 6.5–8.1)

## 2022-02-26 LAB — CBC WITH DIFFERENTIAL (CANCER CENTER ONLY)
Abs Immature Granulocytes: 0.02 10*3/uL (ref 0.00–0.07)
Basophils Absolute: 0.1 10*3/uL (ref 0.0–0.1)
Basophils Relative: 1 %
Eosinophils Absolute: 0.1 10*3/uL (ref 0.0–0.5)
Eosinophils Relative: 2 %
HCT: 37.1 % — ABNORMAL LOW (ref 39.0–52.0)
Hemoglobin: 12.2 g/dL — ABNORMAL LOW (ref 13.0–17.0)
Immature Granulocytes: 0 %
Lymphocytes Relative: 38 %
Lymphs Abs: 2.8 10*3/uL (ref 0.7–4.0)
MCH: 29.1 pg (ref 26.0–34.0)
MCHC: 32.9 g/dL (ref 30.0–36.0)
MCV: 88.5 fL (ref 80.0–100.0)
Monocytes Absolute: 1 10*3/uL (ref 0.1–1.0)
Monocytes Relative: 14 %
Neutro Abs: 3.3 10*3/uL (ref 1.7–7.7)
Neutrophils Relative %: 45 %
Platelet Count: 200 10*3/uL (ref 150–400)
RBC: 4.19 MIL/uL — ABNORMAL LOW (ref 4.22–5.81)
RDW: 16.5 % — ABNORMAL HIGH (ref 11.5–15.5)
WBC Count: 7.4 10*3/uL (ref 4.0–10.5)
nRBC: 0 % (ref 0.0–0.2)

## 2022-02-26 LAB — LACTATE DEHYDROGENASE: LDH: 153 U/L (ref 98–192)

## 2022-02-26 NOTE — Progress Notes (Signed)
?    [facility] ?[phone]   [phone] ? ?OFFICE PROGRESS NOTE ? ?[name], MD ?[address] ?[place] [address] ? ?DIAGNOSIS:  ?1) history of rectal cancer in 2007 status post concurrent chemoradiation followed by surgery. ?2) chronic lymphocytic leukemia diagnosed in 2007. ? ?PRIOR THERAPY: None ? ?CURRENT THERAPY: Ibrutinib 420 mg p.o. daily. ? ?INTERVAL HISTORY: ?Aaron Gonzalez 76 y.o. male returns to the clinic today for follow-up visit accompanied by his sister.  The patient is feeling fine today with no concerning complaints.  He denied having any current chest pain, shortness of breath, cough or hemoptysis.  He has no nausea, vomiting, diarrhea or constipation.  He has no headache or visual changes.  He has no recent weight loss or night sweats.  He continues to tolerate his treatment with ibrutinib fairly well.  He is here today for evaluation and repeat blood work. ? ? ?MEDICAL HISTORY: ?Past Medical History:  ?Diagnosis Date  ? Arthritis   ? CLL (chronic lymphocytic leukemia) ([place])   ? DVT (deep venous thrombosis) ([place])   ? History of rectal cancer   ? Hypertension   ? Stroke [facility])   ? ? ?ALLERGIES:  has No Known Allergies. ? ?MEDICATIONS:  ?Current Outpatient Medications  ?Medication Sig Dispense Refill  ? allopurinol (ZYLOPRIM) 300 MG tablet Take 300 mg by mouth daily.    ? amLODipine (NORVASC) 10 MG tablet Take 1 tablet (10 mg total) by mouth daily. 30 tablet 1  ? aspirin 81 MG chewable tablet Chew 1 tablet (81 mg total) by mouth 2 (two) times daily. 30 tablet 0  ? blood glucose meter kit and supplies KIT Dispense based on patient and insurance preference. Use up to four times daily as directed. 1 each 0  ? carvedilol (COREG) 6.25 MG tablet Take 1 tablet (6.25 mg total) by mouth 2 (two) times daily. 180 tablet 3  ? cloNIDine (CATAPRES) 0.1 MG tablet Take 0.1 mg by mouth daily as needed (If  systolic BP >591).    ? enalapril (VASOTEC) 20 MG tablet  Take 20 mg by mouth 2 (two) times daily.    ? hydrochlorothiazide (HYDRODIURIL) 12.5 MG tablet Take 12.5 mg by mouth daily.    ? hydrochlorothiazide (HYDRODIURIL) 25 MG tablet Take 1 tablet (25 mg total) by mouth daily. 90 tablet 3  ? IMBRUVICA 420 MG tablet TAKE 1 TABLET BY MOUTH ONCE DAILY WITH A FULL GLASS OF  WATER 28 tablet 3  ? methocarbamol (ROBAXIN) 500 MG tablet TAKE 1 TABLET(500 MG) BY MOUTH EVERY 6 HOURS AS NEEDED FOR MUSCLE SPASMS 30 tablet 0  ? oxyCODONE (OXY IR/ROXICODONE) 5 MG immediate release tablet Take 1-2 tablets (5-10 mg total) by mouth every 6 (six) hours as needed for moderate pain (pain score 4-6). 30 tablet 0  ? pantoprazole (PROTONIX) 40 MG tablet Take 40 mg by mouth daily.    ? rosuvastatin (CRESTOR) 20 MG tablet Take 20 mg by mouth at bedtime.    ? ?No current facility-administered medications for this visit.  ? ? ?SURGICAL HISTORY:  ?Past Surgical History:  ?Procedure Laterality Date  ? COLECTOMY    ? with colostomy  ? TOTAL HIP ARTHROPLASTY Left 10/26/2021  ? Procedure: Left TOTAL HIP ARTHROPLASTY ANTERIOR APPROACH;  Surgeon: Mcarthur Rossetti, MD;  Location: [facility];  Service: Orthopedics;  Laterality: Left;  RNFA  ? ? ?REVIEW OF SYSTEMS:  A comprehensive review of systems was negative.  ? ?PHYSICAL  EXAMINATION: General appearance: alert, cooperative, and no distress ?Head: Normocephalic, without obvious abnormality, atraumatic ?Neck: no adenopathy, no JVD, supple, symmetrical, trachea midline, and thyroid not enlarged, symmetric, no tenderness/mass/nodules ?Lymph nodes: Cervical, supraclavicular, and axillary nodes normal. ?Resp: clear to auscultation bilaterally ?Back: symmetric, no curvature. ROM normal. No CVA tenderness. ?Cardio: regular rate and rhythm, S1, S2 normal, no murmur, click, rub or gallop ?GI: soft, non-tender; bowel sounds normal; no masses,  no organomegaly ?Extremities: extremities normal, atraumatic, no cyanosis or edema ? ?ECOG PERFORMANCE STATUS: 1 -  Symptomatic but completely ambulatory ? ?Blood pressure 133/82, pulse (!) 57, temperature 98.2 ?F (36.8 ?C), temperature source Tympanic, resp. rate 17, weight 164 lb 8 oz (74.6 kg), SpO2 96 %. ? ?LABORATORY DATA: ?Lab Results  ?Component Value Date  ? WBC 7.4 02/26/2022  ? HGB 12.2 (L) 02/26/2022  ? HCT 37.1 (L) 02/26/2022  ? MCV 88.5 02/26/2022  ? PLT 200 02/26/2022  ? ? ?  Chemistry   ?   ?Component Value Date/Time  ? NA 141 11/27/2021 1042  ? NA 139 06/28/2021 0954  ? K 3.3 (L) 11/27/2021 1042  ? CL 104 11/27/2021 1042  ? CO2 30 11/27/2021 1042  ? BUN 13 11/27/2021 1042  ? BUN 11 06/28/2021 0954  ? CREATININE 0.92 11/27/2021 1042  ?    ?Component Value Date/Time  ? CALCIUM 9.1 11/27/2021 1042  ? ALKPHOS 64 11/27/2021 1042  ? AST 18 11/27/2021 1042  ? ALT 13 11/27/2021 1042  ? BILITOT 0.9 11/27/2021 1042  ?  ? ? ? ?RADIOGRAPHIC STUDIES: ?VAS [name] LOWER EXTREMITY VENOUS (DVT) ? ?Result Date: 01/28/2022 ? Lower Venous DVT Study Patient Name:  Aaron Gonzalez  Date of Exam:   01/28/2022 Medical Rec #: [id number]       Accession #:    [id number] Date of Birth: September 06, 1946       Patient Gender: M Patient Age:   50 years Exam Location:  [facility] Procedure:      VAS [name] LOWER EXTREMITY VENOUS (DVT) Referring Phys: Jean Rosenthal --------------------------------------------------------------------------------  Indications: Swelling.  Risk Factors: None identified. Limitations: Poor ultrasound/tissue interface. Comparison Study: No prior studies. Performing Technologist: Oliver Hum RVT  Examination Guidelines: A complete evaluation includes B-mode imaging, spectral Doppler, color Doppler, and power Doppler as needed of all accessible portions of each vessel. Bilateral testing is considered an integral part of a complete examination. Limited examinations for reoccurring indications may be performed as noted. The reflux portion of the exam is performed with the patient in reverse Trendelenburg.   +-----+---------------+---------+-----------+----------+--------------+ RIGHTCompressibilityPhasicitySpontaneityPropertiesThrombus Aging +-----+---------------+---------+-----------+----------+--------------+ CFV  Full           Yes      Yes                                 +-----+---------------+---------+-----------+----------+--------------+   +---------+---------------+---------+-----------+----------+-------------------+ LEFT     CompressibilityPhasicitySpontaneityPropertiesThrombus Aging      +---------+---------------+---------+-----------+----------+-------------------+ CFV      Full           Yes      Yes                                      +---------+---------------+---------+-----------+----------+-------------------+ SFJ      Full                                                             +---------+---------------+---------+-----------+----------+-------------------+  FV Prox  Full                                                             +---------+---------------+---------+-----------+----------+-------------------+ FV Mid   Full                                                             +---------+---------------+---------+-----------+----------+-------------------+ FV DistalFull                                                             +---------+---------------+---------+-----------+----------+-------------------+ PFV      Full                                                             +---------+---------------+---------+-----------+----------+-------------------+ POP      Full           Yes      Yes                                      +---------+---------------+---------+-----------+----------+-------------------+ PTV      Full                                                             +---------+---------------+---------+-----------+----------+-------------------+ PERO                                                   Not well visualized +---------+---------------+---------+-----------+----------+-------------------+    Summary: RIGHT: - No evidence of common femoral vein obstruction.  LEFT: - There is no evidence of deep vein thrombosis in the lower extremity. However, portions of this examination were limit

## 2022-04-08 ENCOUNTER — Other Ambulatory Visit: Payer: Self-pay | Admitting: Internal Medicine

## 2022-04-19 ENCOUNTER — Encounter: Payer: Self-pay | Admitting: Podiatry

## 2022-04-19 ENCOUNTER — Ambulatory Visit: Payer: Medicare Other | Admitting: Podiatry

## 2022-04-19 DIAGNOSIS — M79675 Pain in left toe(s): Secondary | ICD-10-CM | POA: Diagnosis not present

## 2022-04-19 DIAGNOSIS — M79674 Pain in right toe(s): Secondary | ICD-10-CM

## 2022-04-19 DIAGNOSIS — B351 Tinea unguium: Secondary | ICD-10-CM | POA: Diagnosis not present

## 2022-04-19 DIAGNOSIS — I739 Peripheral vascular disease, unspecified: Secondary | ICD-10-CM

## 2022-04-19 NOTE — Progress Notes (Signed)
  Subjective:  Patient ID: Aaron Gonzalez, male    DOB: [birth year],  MRN: [id number]  Aaron Gonzalez presents to clinic today for for at risk foot care. Patient has h/o PAD and painful elongated mycotic toenails 1-5 bilaterally which are tender when wearing enclosed shoe gear. Pain is relieved with periodic professional debridement.  New problem(s): None.   PCP is [name], MD , and last visit was 2-3 months ago.  No Known Allergies  Review of Systems: Negative except as noted in the HPI.  Objective: No changes noted in today's physical examination. Aaron Gonzalez is a pleasant 76 y.o. male WD, WN in NAD. AAO x 3.  Vascular Examination:  CFT <4 seconds b/l LE. Palpable DP pulse(s) b/l LE. Diminished PT pulse(s) b/l LE. Pedal hair absent. No pain with calf compression b/l. Lower extremity skin temperature gradient within normal limits. +1 pitting edema right lower extremity. +2 pitting edema left lower extremity. No ischemia or gangrene noted b/l LE. No cyanosis or clubbing noted b/l LE.  Dermatological Examination: Pedal integument with normal turgor, texture and tone BLE. No open wounds b/l LE. No interdigital macerations noted b/l LE. Toenails 1-5 bilaterally elongated, discolored, dystrophic, thickened, and crumbly with subungual debris and tenderness to dorsal palpation. No hyperkeratotic nor porokeratotic lesions present on today's visit.  Musculoskeletal: Muscle strength 5/5 to all lower extremity muscle groups bilaterally. Pes planus deformity noted bilateral LE. Utilizes cane for ambulation assistance today.  Neurological: Protective sensation intact 5/5 intact bilaterally with 10g monofilament b/l. Vibratory sensation decreased b/l.  Assessment/Plan: 1. Pain due to onychomycosis of toenails of both feet   2. PAD (peripheral artery disease) ([place])   -Examined patient. -Patient to continue soft, supportive shoe gear daily. -Toenails 1-5 b/l were debrided in length and  girth with sterile nail nippers and dremel without iatrogenic bleeding.  -Patient/POA to call should there be question/concern in the interim.   Return in about 3 months (around 07/20/2022).  Marzetta Board, DPM

## 2022-05-29 ENCOUNTER — Other Ambulatory Visit: Payer: Self-pay

## 2022-05-29 ENCOUNTER — Inpatient Hospital Stay: Payer: Medicare PPO | Attending: Internal Medicine | Admitting: Internal Medicine

## 2022-05-29 ENCOUNTER — Inpatient Hospital Stay: Payer: Medicare PPO

## 2022-05-29 ENCOUNTER — Encounter: Payer: Self-pay | Admitting: Internal Medicine

## 2022-05-29 VITALS — BP 124/81 | HR 72 | Temp 98.2°F | Resp 16 | Wt 167.7 lb

## 2022-05-29 DIAGNOSIS — Z923 Personal history of irradiation: Secondary | ICD-10-CM | POA: Insufficient documentation

## 2022-05-29 DIAGNOSIS — Z8673 Personal history of transient ischemic attack (TIA), and cerebral infarction without residual deficits: Secondary | ICD-10-CM | POA: Diagnosis not present

## 2022-05-29 DIAGNOSIS — C9110 Chronic lymphocytic leukemia of B-cell type not having achieved remission: Secondary | ICD-10-CM

## 2022-05-29 DIAGNOSIS — Z9049 Acquired absence of other specified parts of digestive tract: Secondary | ICD-10-CM | POA: Insufficient documentation

## 2022-05-29 DIAGNOSIS — Z79899 Other long term (current) drug therapy: Secondary | ICD-10-CM | POA: Diagnosis not present

## 2022-05-29 DIAGNOSIS — I10 Essential (primary) hypertension: Secondary | ICD-10-CM | POA: Insufficient documentation

## 2022-05-29 DIAGNOSIS — Z85048 Personal history of other malignant neoplasm of rectum, rectosigmoid junction, and anus: Secondary | ICD-10-CM | POA: Insufficient documentation

## 2022-05-29 DIAGNOSIS — Z86718 Personal history of other venous thrombosis and embolism: Secondary | ICD-10-CM | POA: Insufficient documentation

## 2022-05-29 DIAGNOSIS — Z5111 Encounter for antineoplastic chemotherapy: Secondary | ICD-10-CM

## 2022-05-29 LAB — CMP (CANCER CENTER ONLY)
ALT: 11 U/L (ref 0–44)
AST: 37 U/L (ref 15–41)
Albumin: 4.2 g/dL (ref 3.5–5.0)
Alkaline Phosphatase: 51 U/L (ref 38–126)
Anion gap: 5 (ref 5–15)
BUN: 20 mg/dL (ref 8–23)
CO2: 31 mmol/L (ref 22–32)
Calcium: 9.1 mg/dL (ref 8.9–10.3)
Chloride: 105 mmol/L (ref 98–111)
Creatinine: 1.3 mg/dL — ABNORMAL HIGH (ref 0.61–1.24)
GFR, Estimated: 57 mL/min — ABNORMAL LOW (ref >60)
Glucose, Bld: 82 mg/dL (ref 70–99)
Potassium: 4 mmol/L (ref 3.5–5.1)
Sodium: 141 mmol/L (ref 135–145)
Total Bilirubin: 0.7 mg/dL (ref 0.3–1.2)
Total Protein: 7 g/dL (ref 6.5–8.1)

## 2022-05-29 LAB — CBC WITH DIFFERENTIAL (CANCER CENTER ONLY)
Abs Immature Granulocytes: 0 10*3/uL (ref 0.00–0.07)
Basophils Absolute: 0.3 10*3/uL — ABNORMAL HIGH (ref 0.0–0.1)
Basophils Relative: 2 %
Eosinophils Absolute: 0.8 10*3/uL — ABNORMAL HIGH (ref 0.0–0.5)
Eosinophils Relative: 6 %
HCT: 35.7 % — ABNORMAL LOW (ref 39.0–52.0)
Hemoglobin: 12 g/dL — ABNORMAL LOW (ref 13.0–17.0)
Lymphocytes Relative: 43 %
Lymphs Abs: 5.7 10*3/uL — ABNORMAL HIGH (ref 0.7–4.0)
MCH: 30.4 pg (ref 26.0–34.0)
MCHC: 33.6 g/dL (ref 30.0–36.0)
MCV: 90.4 fL (ref 80.0–100.0)
Monocytes Absolute: 1.3 10*3/uL — ABNORMAL HIGH (ref 0.1–1.0)
Monocytes Relative: 10 %
Neutro Abs: 5.1 10*3/uL (ref 1.7–7.7)
Neutrophils Relative %: 39 %
Platelet Count: 220 10*3/uL (ref 150–400)
RBC: 3.95 MIL/uL — ABNORMAL LOW (ref 4.22–5.81)
RDW: 17.4 % — ABNORMAL HIGH (ref 11.5–15.5)
Smear Review: NORMAL
WBC Count: 13.2 10*3/uL — ABNORMAL HIGH (ref 4.0–10.5)
nRBC: 0 % (ref 0.0–0.2)

## 2022-05-29 LAB — LACTATE DEHYDROGENASE: LDH: 1150 U/L — ABNORMAL HIGH (ref 98–192)

## 2022-05-29 NOTE — Progress Notes (Signed)
[facility] [phone]   Fax:([phone]  OFFICE PROGRESS NOTE  [name], MD [place] [address]  DIAGNOSIS:  1) History of rectal cancer in 2007 status post concurrent chemoradiation followed by surgery. 2) Chronic lymphocytic leukemia diagnosed in 2007.  PRIOR THERAPY: None  CURRENT THERAPY: Ibrutinib 420 mg p.o. daily.  INTERVAL HISTORY: Aaron Gonzalez 76 y.o. male returns to the clinic today for 3 months follow-up visit.  The patient is feeling fine today with no concerning complaints.  He is recovering from his hip surgery by Dr. Ninfa Linden.  He denied having any current chest pain, shortness of breath, cough or hemoptysis.  He denied having any fever or chills.  He has no nausea, vomiting, diarrhea or constipation.  He has no headache or visual changes.  He has no palpable lymphadenopathy.  The patient continues to tolerate his treatment with ibrutinib fairly well.  He is here for evaluation and repeat blood work.   MEDICAL HISTORY: Past Medical History:  Diagnosis Date   Arthritis    CLL (chronic lymphocytic leukemia) ([place])    DVT (deep venous thrombosis) ([facility])    History of rectal cancer    Hypertension    Stroke ([place])     ALLERGIES:  has No Known Allergies.  MEDICATIONS:  Current Outpatient Medications  Medication Sig Dispense Refill   allopurinol (ZYLOPRIM) 300 MG tablet Take 1 tablet by mouth daily.     amLODipine (NORVASC) 10 MG tablet Take 1 tablet by mouth daily.     blood glucose meter kit and supplies KIT Dispense based on patient and insurance preference. Use up to four times daily as directed. 1 each 0   carvedilol (COREG) 6.25 MG tablet Take 1 tablet (6.25 mg total) by mouth 2 (two) times daily. 180 tablet 3   cloNIDine (CATAPRES) 0.1 MG tablet Take 0.1 mg by mouth daily as needed (If  systolic BP >419).     enalapril (VASOTEC) 20 MG tablet Take 1 tablet by mouth 2 (two) times daily.      hydrochlorothiazide (HYDRODIURIL) 25 MG tablet Take 1 tablet (25 mg total) by mouth daily. 90 tablet 3   IMBRUVICA 420 MG tablet TAKE 1 TABLET BY MOUTH ONCE  DAILY WITH A FULL GLASS OF WATER 28 tablet 3   pantoprazole (PROTONIX) 40 MG tablet Take 1 tablet by mouth daily.     rosuvastatin (CRESTOR) 20 MG tablet Take 20 mg by mouth at bedtime.     valACYclovir (VALTREX) 500 MG tablet Take 1 tablet twice a day by oral route.     aspirin 81 MG chewable tablet Chew 1 tablet (81 mg total) by mouth 2 (two) times daily. (Patient not taking: Reported on 05/29/2022) 30 tablet 0   hydrochlorothiazide (HYDRODIURIL) 12.5 MG tablet Take 12.5 mg by mouth daily. (Patient not taking: Reported on 05/29/2022)     methocarbamol (ROBAXIN) 500 MG tablet TAKE 1 TABLET(500 MG) BY MOUTH EVERY 6 HOURS AS NEEDED FOR MUSCLE SPASMS (Patient not taking: Reported on 05/29/2022) 30 tablet 0   oxyCODONE (OXY IR/ROXICODONE) 5 MG immediate release tablet Take 1-2 tablets (5-10 mg total) by mouth every 6 (six) hours as needed for moderate pain (pain score 4-6). (Patient not taking: Reported on 05/29/2022) 30 tablet 0   No current facility-administered medications for this visit.    SURGICAL HISTORY:  Past Surgical History:  Procedure Laterality Date   COLECTOMY     with colostomy  TOTAL HIP ARTHROPLASTY Left 10/26/2021   Procedure: Left TOTAL HIP ARTHROPLASTY ANTERIOR APPROACH;  Surgeon: Mcarthur Rossetti, MD;  Location: [facility];  Service: Orthopedics;  Laterality: Left;  RNFA    REVIEW OF SYSTEMS:  A comprehensive review of systems was negative.   PHYSICAL EXAMINATION: General appearance: alert, cooperative, and no distress Head: Normocephalic, without obvious abnormality, atraumatic Neck: no adenopathy, no JVD, supple, symmetrical, trachea midline, and thyroid not enlarged, symmetric, no tenderness/mass/nodules Lymph nodes: Cervical, supraclavicular, and axillary nodes normal. Resp: clear to auscultation bilaterally Back:  symmetric, no curvature. ROM normal. No CVA tenderness. Cardio: regular rate and rhythm, S1, S2 normal, no murmur, click, rub or gallop GI: soft, non-tender; bowel sounds normal; no masses,  no organomegaly Extremities: extremities normal, atraumatic, no cyanosis or edema  ECOG PERFORMANCE STATUS: 1 - Symptomatic but completely ambulatory  Blood pressure 124/81, pulse 72, temperature 98.2 F (36.8 C), temperature source Oral, resp. rate 16, weight 167 lb 11.2 oz (76.1 kg), SpO2 97 %.  LABORATORY DATA: Lab Results  Component Value Date   WBC 13.2 (H) 05/29/2022   HGB 12.0 (L) 05/29/2022   HCT 35.7 (L) 05/29/2022   MCV 90.4 05/29/2022   PLT 220 05/29/2022      Chemistry      Component Value Date/Time   NA 141 05/29/2022 0931   NA 139 06/28/2021 0954   K 4.0 05/29/2022 0931   CL 105 05/29/2022 0931   CO2 31 05/29/2022 0931   BUN 20 05/29/2022 0931   BUN 11 06/28/2021 0954   CREATININE 1.30 (H) 05/29/2022 0931      Component Value Date/Time   CALCIUM 9.1 05/29/2022 0931   ALKPHOS 51 05/29/2022 0931   AST 37 05/29/2022 0931   ALT 11 05/29/2022 0931   BILITOT 0.7 05/29/2022 0931       RADIOGRAPHIC STUDIES: No results found.  ASSESSMENT AND PLAN: This is a very pleasant 76 years old African-American male with history of rectal cancer diagnosed in 2007 status post concurrent chemoradiation followed by surgical resection.  At the same time the patient was diagnosed with chronic lymphocytic leukemia and has been in observation initially for several years but started on treatment with ibrutinib 420 mg in 2019. The patient has been tolerating this treatment well with no concerning adverse effects.  His CBC today showed mildly elevated total white blood count that need to be monitored closely on the upcoming blood work.  LDH came back very high at 1150.  This will need further investigation.  I will wait for the final differential of the white blood cells before taking the next step  in his management. I recommended for him to continue his current treatment with ibrutinib with the same dose. I will see him back for follow-up visit in 3 months for evaluation and repeat blood work. He was advised to call immediately if he has any concerning symptoms in the interval. The patient voices understanding of current disease status and treatment options and is in agreement with the current care plan. All questions were answered. The patient knows to call the clinic with any problems, questions or concerns. We can certainly see the patient much sooner if necessary.  Disclaimer: This note was dictated with voice recognition software. Similar sounding words can inadvertently be transcribed and may not be corrected upon review.

## 2022-05-30 ENCOUNTER — Telehealth: Payer: Self-pay | Admitting: Internal Medicine

## 2022-05-30 NOTE — Telephone Encounter (Signed)
Scheduled per 8/9 in basket, pt has been called and confirmed

## 2022-06-03 IMAGING — DX DG PORTABLE PELVIS
1 series · 1 of 1 positions shown · non-contrast
Comparison: Portable exam 3334 hours compared intraoperative images
of 10/26/2021

CLINICAL DATA: Post hip surgery

EXAM:
PORTABLE PELVIS 1-2 VIEWS

[pelvis ap]
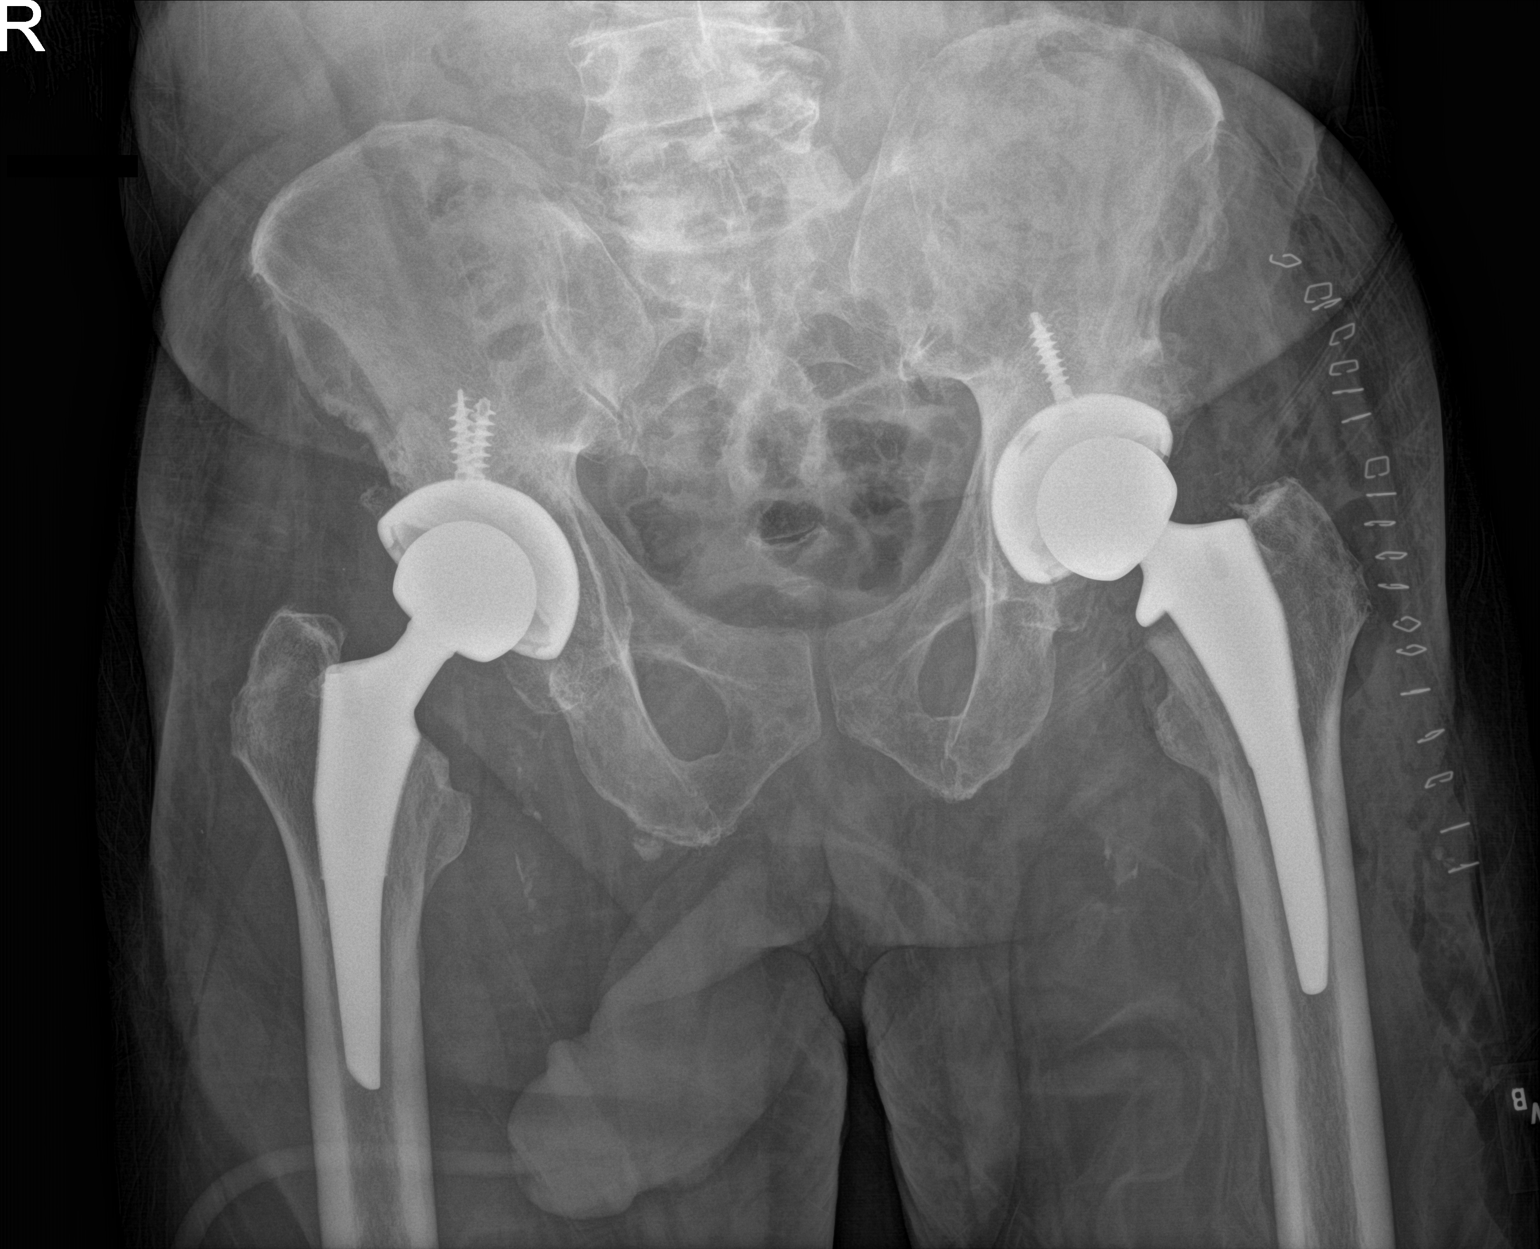

[1 of 1 positions shown; findings below may reference images not displayed]

FINDINGS: Osseous demineralization.

Indwelling RIGHT and new LEFT hip prostheses identified.

No acute fracture, dislocation, or bone destruction.
IMPRESSION: New LEFT hip prosthesis without acute complication.

## 2022-06-03 NOTE — Progress Notes (Signed)
[facility] OFFICE PROGRESS NOTE  [name], MD Aaron Gonzalez [id number]  DIAGNOSIS: 1) History of rectal cancer in 2007 status post concurrent chemoradiation followed by surgery. 2) Chronic lymphocytic leukemia diagnosed in 2007.  PRIOR THERAPY: None  CURRENT THERAPY: Ibrutinib 420 mg p.o. daily.   INTERVAL HISTORY: Aaron Gonzalez 76 y.o. male returns to the clinic today for a follow-up visit.  The patient was last seen by Dr. Julien Nordmann on 05/29/2022.  At that point time, the patient is feeling fine without any new concerning complaints but had some significant elevation in his LDH.  Additionally, his white blood cell count was mildly elevated with his differential showing elevated lymphocytes.  Due to the changes in his labs, Dr. Julien Nordmann recommended close interval follow-up.  Since last being seen, the patient denies any fever, chills, or lymphadenopathy.  Denies any unexplained weight loss. He had some episode of night sweats last week.  Denies any recent signs or symptoms of infection including ear pain, nasal congestion, sore throat, skin infections, cough, shortness of breath, dysuria, abdominal pain, or diarrhea.  The patient denies any abnormal bleeding or bruising.  The patient is here today for evaluation and repeat blood work.   MEDICAL HISTORY: Past Medical History:  Diagnosis Date   Arthritis    CLL (chronic lymphocytic leukemia) ([place])    DVT (deep venous thrombosis) ([facility])    History of rectal cancer    Hypertension    Stroke ([place])     ALLERGIES:  has No Known Allergies.  MEDICATIONS:  Current Outpatient Medications  Medication Sig Dispense Refill   amLODipine (NORVASC) 10 MG tablet Take 1 tablet by mouth daily.     blood glucose meter kit and supplies KIT Dispense based on patient and insurance preference. Use up to four times daily as directed. 1 each 0   carvedilol (COREG) 6.25 MG tablet Take 1 tablet (6.25 mg total) by mouth 2 (two)  times daily. 180 tablet 3   hydrochlorothiazide (HYDRODIURIL) 25 MG tablet Take 1 tablet (25 mg total) by mouth daily. 90 tablet 3   IMBRUVICA 420 MG tablet TAKE 1 TABLET BY MOUTH ONCE  DAILY WITH A FULL GLASS OF WATER 28 tablet 3   aspirin 81 MG chewable tablet Chew 1 tablet (81 mg total) by mouth 2 (two) times daily. (Patient not taking: Reported on 05/29/2022) 30 tablet 0   No current facility-administered medications for this visit.    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   Procedure: Left TOTAL HIP ARTHROPLASTY ANTERIOR APPROACH;  Surgeon: Mcarthur Rossetti, MD;  Location: [facility];  Service: Orthopedics;  Laterality: Left;  RNFA    REVIEW OF SYSTEMS:   Review of Systems  Constitutional: Negative for appetite change, chills, fatigue, fever and unexpected weight change.  HENT: Negative for mouth sores, nosebleeds, sore throat and trouble swallowing.   Eyes: Negative for eye problems and icterus.  Respiratory: Negative for cough, hemoptysis, shortness of breath and wheezing.   Cardiovascular: Negative for chest pain. Positive for bilateral lower extremity swelling.  Gastrointestinal: Negative for abdominal pain, constipation, diarrhea, nausea and vomiting.  Genitourinary: Negative for bladder incontinence, difficulty urinating, dysuria, frequency and hematuria.   Musculoskeletal: Negative for back pain, gait problem, neck pain and neck stiffness.  Skin: Negative for itching and rash.  Neurological: Negative for dizziness, extremity weakness, gait  problem, headaches, light-headedness and seizures.  Hematological: Negative for adenopathy. Does not bruise/bleed easily.  Psychiatric/Behavioral: Negative for confusion, depression and sleep disturbance. The patient is not nervous/anxious.     PHYSICAL EXAMINATION:  Blood pressure 122/77, pulse 65, temperature 97.7 F (36.5 C),  temperature source Oral, resp. rate 18, height 5' 5"  (1.651 m), weight 164 lb 6.4 oz (74.6 kg), SpO2 96 %.  ECOG PERFORMANCE STATUS: 1  Physical Exam  Constitutional: Oriented to person, place, and time and well-developed, well-nourished, and in no distress.  HENT:  Head: Normocephalic and atraumatic.  Mouth/Throat: Oropharynx is clear and moist. No oropharyngeal exudate.  Eyes: Conjunctivae are normal. Right eye exhibits no discharge. Left eye exhibits no discharge. No scleral icterus.  Neck: Normal range of motion. Neck supple.  Cardiovascular: Normal rate, regular rhythm, normal heart sounds and intact distal pulses.   Pulmonary/Chest: Effort normal. Murmur noted. No respiratory distress. No wheezes. No rales.  Abdominal: Soft. Bowel sounds are normal. Exhibits no distension and no mass. There is no tenderness.  Musculoskeletal: Normal range of motion. Bilateral lower extremity swelling. Lymphadenopathy:    No cervical adenopathy.  Neurological: Alert and oriented to person, place, and time. Exhibits normal muscle tone. Gait normal. Coordination normal. Ambulates with a cane.  Skin: Skin is warm and dry. No rash noted. Not diaphoretic. No erythema. No pallor.  Psychiatric: Mood, memory and judgment normal.  Vitals reviewed.  LABORATORY DATA: Lab Results  Component Value Date   WBC 8.2 06/10/2022   HGB 12.2 (L) 06/10/2022   HCT 36.3 (L) 06/10/2022   MCV 89.4 06/10/2022   PLT 260 06/10/2022      Chemistry      Component Value Date/Time   NA 138 06/10/2022 1125   NA 139 06/28/2021 0954   K 3.4 (L) 06/10/2022 1125   CL 105 06/10/2022 1125   CO2 30 06/10/2022 1125   BUN 15 06/10/2022 1125   BUN 11 06/28/2021 0954   CREATININE 1.02 06/10/2022 1125      Component Value Date/Time   CALCIUM 9.1 06/10/2022 1125   ALKPHOS 42 06/10/2022 1125   AST 20 06/10/2022 1125   ALT 13 06/10/2022 1125   BILITOT 1.2 06/10/2022 1125       RADIOGRAPHIC STUDIES:  No results  found.   ASSESSMENT/PLAN:  This is a very pleasant 76 year old African-American male diagnosed with: 1) rectal cancer in 2007 status post concurrent chemoradiation followed by surgical resection 2) at the same time, the patient was diagnosed with CLL (chronic lymphocytic leukemia) and had been on observation initially but started treatment with ibrutinib 420 mg in 2019.  He has been tolerating treatment without concerning adverse side effects.  The patient was last seen by Dr. Julien Nordmann on 05/29/2022 which showed significantly elevated LDH.  The patient's differential showed elevated total white blood cell count with elevated lymphocytes.   The patient had repeat labs which showed normal total WBC's at 8.2 compared to 13.2 12 days ago. His Hbg is stable at 12.2. His differential shows mildly elevated lymphocytes at 4.6 (5.7 12 days ago). His LDH, haptoglobin, and direct antiglobulin test is pending.   We will call the patient with the pending lab studies to discuss any further instructions and follow up depending on his test results.   The patient was advised to call immediately if he has any concerning symptoms in the interval. The patient voices understanding of current disease status and treatment options and is in agreement with the current care plan. All  questions were answered. The patient knows to call the clinic with any problems, questions or concerns. We can certainly see the patient much sooner if necessary    Orders Placed This Encounter  Procedures   CBC with Differential ([facility] Only)    Standing Status:   Future    Standing Expiration Date:   06/11/2023   CMP ([facility] only)    Standing Status:   Future    Standing Expiration Date:   06/11/2023   Lactate dehydrogenase (LDH)    Standing Status:   Future    Standing Expiration Date:   06/10/2023     The total time spent in the appointment was 20-29 minutes  Eward Rutigliano L Cherrise Occhipinti, PA-C 06/10/22

## 2022-06-10 ENCOUNTER — Inpatient Hospital Stay: Payer: Medicare PPO | Admitting: Physician Assistant

## 2022-06-10 ENCOUNTER — Encounter: Payer: Self-pay | Admitting: Physician Assistant

## 2022-06-10 ENCOUNTER — Inpatient Hospital Stay: Payer: Medicare PPO

## 2022-06-10 ENCOUNTER — Other Ambulatory Visit: Payer: Self-pay

## 2022-06-10 VITALS — BP 122/77 | HR 65 | Temp 97.7°F | Resp 18 | Ht 65.0 in | Wt 164.4 lb

## 2022-06-10 DIAGNOSIS — Z85048 Personal history of other malignant neoplasm of rectum, rectosigmoid junction, and anus: Secondary | ICD-10-CM | POA: Diagnosis not present

## 2022-06-10 DIAGNOSIS — C9110 Chronic lymphocytic leukemia of B-cell type not having achieved remission: Secondary | ICD-10-CM

## 2022-06-10 LAB — CMP (CANCER CENTER ONLY)
ALT: 13 U/L (ref 0–44)
AST: 20 U/L (ref 15–41)
Albumin: 3.9 g/dL (ref 3.5–5.0)
Alkaline Phosphatase: 42 U/L (ref 38–126)
Anion gap: 3 — ABNORMAL LOW (ref 5–15)
BUN: 15 mg/dL (ref 8–23)
CO2: 30 mmol/L (ref 22–32)
Calcium: 9.1 mg/dL (ref 8.9–10.3)
Chloride: 105 mmol/L (ref 98–111)
Creatinine: 1.02 mg/dL (ref 0.61–1.24)
GFR, Estimated: >60 mL/min (ref >60)
Glucose, Bld: 75 mg/dL (ref 70–99)
Potassium: 3.4 mmol/L — ABNORMAL LOW (ref 3.5–5.1)
Sodium: 138 mmol/L (ref 135–145)
Total Bilirubin: 1.2 mg/dL (ref 0.3–1.2)
Total Protein: 6.3 g/dL — ABNORMAL LOW (ref 6.5–8.1)

## 2022-06-10 LAB — CBC WITH DIFFERENTIAL (CANCER CENTER ONLY)
Abs Immature Granulocytes: 0.01 10*3/uL (ref 0.00–0.07)
Basophils Absolute: 0.1 10*3/uL (ref 0.0–0.1)
Basophils Relative: 2 %
Eosinophils Absolute: 0.3 10*3/uL (ref 0.0–0.5)
Eosinophils Relative: 4 %
HCT: 36.3 % — ABNORMAL LOW (ref 39.0–52.0)
Hemoglobin: 12.2 g/dL — ABNORMAL LOW (ref 13.0–17.0)
Immature Granulocytes: 0 %
Lymphocytes Relative: 56 %
Lymphs Abs: 4.6 10*3/uL — ABNORMAL HIGH (ref 0.7–4.0)
MCH: 30 pg (ref 26.0–34.0)
MCHC: 33.6 g/dL (ref 30.0–36.0)
MCV: 89.4 fL (ref 80.0–100.0)
Monocytes Absolute: 0.7 10*3/uL (ref 0.1–1.0)
Monocytes Relative: 9 %
Neutro Abs: 2.4 10*3/uL (ref 1.7–7.7)
Neutrophils Relative %: 29 %
Platelet Count: 260 10*3/uL (ref 150–400)
RBC: 4.06 MIL/uL — ABNORMAL LOW (ref 4.22–5.81)
RDW: 16.4 % — ABNORMAL HIGH (ref 11.5–15.5)
WBC Count: 8.2 10*3/uL (ref 4.0–10.5)
nRBC: 0 % (ref 0.0–0.2)

## 2022-06-10 LAB — LACTATE DEHYDROGENASE: LDH: 232 U/L — ABNORMAL HIGH (ref 98–192)

## 2022-06-10 LAB — DIRECT ANTIGLOBULIN TEST (NOT AT ARMC)
DAT, IgG: NEGATIVE
DAT, complement: NEGATIVE

## 2022-06-10 LAB — RETICULOCYTES
Immature Retic Fract: 18.5 % — ABNORMAL HIGH (ref 2.3–15.9)
RBC.: 3.98 MIL/uL — ABNORMAL LOW (ref 4.22–5.81)
Retic Count, Absolute: 58.5 10*3/uL (ref 19.0–186.0)
Retic Ct Pct: 1.5 % (ref 0.4–3.1)

## 2022-06-11 LAB — HAPTOGLOBIN: Haptoglobin: 144 mg/dL (ref 34–355)

## 2022-06-15 ENCOUNTER — Other Ambulatory Visit: Payer: Self-pay | Admitting: Cardiology

## 2022-07-10 NOTE — Progress Notes (Unsigned)
[facility] OFFICE PROGRESS NOTE  [name], MD [place] [address]  DIAGNOSIS:  1) History of rectal cancer in 2007 status post concurrent chemoradiation followed by surgery. 2) Chronic lymphocytic leukemia diagnosed in 2007.   PRIOR THERAPY: None  CURRENT THERAPY: Ibrutinib 420 mg p.o. daily.   INTERVAL HISTORY: Aaron Gonzalez 76 y.o. male returns to the clinic today for follow-up visit.  The patient was last seen in clinic by myself on 06/10/2022.  At that point time, at the patient's appointment prior to this he had significant elevated LDH while his white blood cell count was mildly elevated his differential showed elevated lymphocytes..  Therefore we have been following him with close interval follow-up.  Since last being seen he denies any changes in his health.  Denies any fever, chills, or lymphadenopathy.  Denies any unexplained weight loss.  He sometimes has episodes of night sweats.  Denies any recent signs or symptoms of infection including ear pain, nasal congestion, sore throat, skin infections, cough, shortness of breath, dysuria, abdominal pain, or diarrhea.  Denies any abnormal bleeding or bruising.  He is here today for evaluation and repeat blood work.     MEDICAL HISTORY: Past Medical History:  Diagnosis Date   Arthritis    CLL (chronic lymphocytic leukemia) ([facility] [place])    DVT (deep venous thrombosis) ([facility])    History of rectal cancer    Hypertension    Stroke ([place])     ALLERGIES:  has No Known Allergies.  MEDICATIONS:  Current Outpatient Medications  Medication Sig Dispense Refill   amLODipine (NORVASC) 10 MG tablet Take 1 tablet by mouth daily.     aspirin 81 MG chewable tablet Chew 1 tablet (81 mg total) by mouth 2 (two) times daily. (Patient not taking: Reported on 05/29/2022) 30 tablet 0   blood glucose meter kit and supplies KIT Dispense based on patient and insurance preference. Use up to four times daily as directed. 1  each 0   carvedilol (COREG) 6.25 MG tablet TAKE 1 TABLET(6.25 MG) BY MOUTH TWICE DAILY 180 tablet 3   hydrochlorothiazide (HYDRODIURIL) 25 MG tablet Take 1 tablet (25 mg total) by mouth daily. 90 tablet 3   IMBRUVICA 420 MG tablet TAKE 1 TABLET BY MOUTH ONCE  DAILY WITH A FULL GLASS OF WATER 28 tablet 3   No current facility-administered medications for this visit.    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   Procedure: Left TOTAL HIP ARTHROPLASTY ANTERIOR APPROACH;  Surgeon: Mcarthur Rossetti, MD;  Location: [facility];  Service: Orthopedics;  Laterality: Left;  RNFA    REVIEW OF SYSTEMS:   Review of Systems  Constitutional: Negative for appetite change, chills, fatigue, fever and unexpected weight change.  HENT:   Negative for mouth sores, nosebleeds, sore throat and trouble swallowing.   Eyes: Negative for eye problems and icterus.  Respiratory: Negative for cough, hemoptysis, shortness of breath and wheezing.   Cardiovascular: Negative for chest pain and leg swelling.  Gastrointestinal: Negative for abdominal pain, constipation, diarrhea, nausea and vomiting.  Genitourinary: Negative for bladder incontinence, difficulty urinating, dysuria, frequency and hematuria.   Musculoskeletal: Negative for back pain, gait problem, neck pain and neck stiffness.  Skin: Negative for itching and rash.  Neurological: Negative for dizziness, extremity weakness, gait problem, headaches, light-headedness and seizures.  Hematological: Negative for adenopathy.  Does not bruise/bleed easily.  Psychiatric/Behavioral: Negative for confusion, depression and sleep disturbance. The patient is not nervous/anxious.     PHYSICAL EXAMINATION:  There were no vitals taken for this visit.  ECOG PERFORMANCE STATUS: {CHL ONC ECOG [id number]  Physical Exam  Constitutional: Oriented to person, place, and time  and well-developed, well-nourished, and in no distress. No distress.  HENT:  Head: Normocephalic and atraumatic.  Mouth/Throat: Oropharynx is clear and moist. No oropharyngeal exudate.  Eyes: Conjunctivae are normal. Right eye exhibits no discharge. Left eye exhibits no discharge. No scleral icterus.  Neck: Normal range of motion. Neck supple.  Cardiovascular: Normal rate, regular rhythm, normal heart sounds and intact distal pulses.   Pulmonary/Chest: Effort normal and breath sounds normal. No respiratory distress. No wheezes. No rales.  Abdominal: Soft. Bowel sounds are normal. Exhibits no distension and no mass. There is no tenderness.  Musculoskeletal: Normal range of motion. Exhibits no edema.  Lymphadenopathy:    No cervical adenopathy.  Neurological: Alert and oriented to person, place, and time. Exhibits normal muscle tone. Gait normal. Coordination normal.  Skin: Skin is warm and dry. No rash noted. Not diaphoretic. No erythema. No pallor.  Psychiatric: Mood, memory and judgment normal.  Vitals reviewed.  LABORATORY DATA: Lab Results  Component Value Date   WBC 8.2 06/10/2022   HGB 12.2 (L) 06/10/2022   HCT 36.3 (L) 06/10/2022   MCV 89.4 06/10/2022   PLT 260 06/10/2022      Chemistry      Component Value Date/Time   NA 138 06/10/2022 1125   NA 139 06/28/2021 0954   K 3.4 (L) 06/10/2022 1125   CL 105 06/10/2022 1125   CO2 30 06/10/2022 1125   BUN 15 06/10/2022 1125   BUN 11 06/28/2021 0954   CREATININE 1.02 06/10/2022 1125      Component Value Date/Time   CALCIUM 9.1 06/10/2022 1125   ALKPHOS 42 06/10/2022 1125   AST 20 06/10/2022 1125   ALT 13 06/10/2022 1125   BILITOT 1.2 06/10/2022 1125       RADIOGRAPHIC STUDIES:  No results found.   ASSESSMENT/PLAN:  This is a very pleasant 76 year old African-American male diagnosed with: 1) rectal cancer in 2007 status post concurrent chemoradiation followed by surgical resection 2) at the same time, the patient  was diagnosed with CLL (chronic lymphocytic leukemia) and had been on observation initially but started treatment with ibrutinib 420 mg in 2019.   He has been tolerating treatment without concerning adverse side effects.  When the patient was last seen by Dr. Julien Nordmann on 05/29/2022 which showed significantly elevated LDH.  The patient's differential showed elevated total white blood cell count with elevated lymphocytes.    The patient had repeat labs on 06/10/22 which showed normal total WBC's at 8.2 compared to 13.2. His Hbg is stable at 12.2. His differential shows mildly elevated lymphocytes at 4.6 (5.7 12 days ago). His LDH, haptoglobin, and direct antiglobulinwhich showed ***  The patient was seen with Dr. Julien Nordmann today. Labs were reviewed. Recommend that he *** continue on the same treatment at the same dose.   We will see him back for a follow up visit in *** for evaluation and repeat blood work.   The patient was advised to call immediately if he has any concerning symptoms in the interval. The patient voices understanding of current disease status and treatment options and is in agreement with the current care plan. All questions were answered. The patient knows to call the  clinic with any problems, questions or concerns. We can certainly see the patient much sooner if necessary     No orders of the defined types were placed in this encounter.    I spent {CHL ONC TIME VISIT - OLIDC:[id number]} counseling the patient face to face. The total time spent in the appointment was {CHL ONC TIME VISIT - LNZVJ:[id number]}.  Lataysha Vohra L Arely Tinner, PA-C 07/10/22

## 2022-07-11 ENCOUNTER — Other Ambulatory Visit: Payer: Self-pay

## 2022-07-11 ENCOUNTER — Inpatient Hospital Stay (HOSPITAL_BASED_OUTPATIENT_CLINIC_OR_DEPARTMENT_OTHER): Payer: Medicare PPO | Admitting: Physician Assistant

## 2022-07-11 ENCOUNTER — Inpatient Hospital Stay: Payer: Medicare PPO | Attending: Internal Medicine

## 2022-07-11 VITALS — BP 137/85 | HR 68 | Temp 98.3°F | Resp 16 | Wt 167.6 lb

## 2022-07-11 DIAGNOSIS — C9110 Chronic lymphocytic leukemia of B-cell type not having achieved remission: Secondary | ICD-10-CM

## 2022-07-11 DIAGNOSIS — Z85048 Personal history of other malignant neoplasm of rectum, rectosigmoid junction, and anus: Secondary | ICD-10-CM | POA: Insufficient documentation

## 2022-07-11 DIAGNOSIS — Z79899 Other long term (current) drug therapy: Secondary | ICD-10-CM | POA: Diagnosis not present

## 2022-07-11 DIAGNOSIS — Z923 Personal history of irradiation: Secondary | ICD-10-CM | POA: Diagnosis not present

## 2022-07-11 LAB — CBC WITH DIFFERENTIAL (CANCER CENTER ONLY)
Abs Immature Granulocytes: 0.02 10*3/uL (ref 0.00–0.07)
Basophils Absolute: 0.1 10*3/uL (ref 0.0–0.1)
Basophils Relative: 2 %
Eosinophils Absolute: 0.2 10*3/uL (ref 0.0–0.5)
Eosinophils Relative: 3 %
HCT: 38.5 % — ABNORMAL LOW (ref 39.0–52.0)
Hemoglobin: 12.7 g/dL — ABNORMAL LOW (ref 13.0–17.0)
Immature Granulocytes: 0 %
Lymphocytes Relative: 46 %
Lymphs Abs: 3.2 10*3/uL (ref 0.7–4.0)
MCH: 30.1 pg (ref 26.0–34.0)
MCHC: 33 g/dL (ref 30.0–36.0)
MCV: 91.2 fL (ref 80.0–100.0)
Monocytes Absolute: 0.9 10*3/uL (ref 0.1–1.0)
Monocytes Relative: 13 %
Neutro Abs: 2.5 10*3/uL (ref 1.7–7.7)
Neutrophils Relative %: 36 %
Platelet Count: 239 10*3/uL (ref 150–400)
RBC: 4.22 MIL/uL (ref 4.22–5.81)
RDW: 15.7 % — ABNORMAL HIGH (ref 11.5–15.5)
WBC Count: 6.9 10*3/uL (ref 4.0–10.5)
nRBC: 0 % (ref 0.0–0.2)

## 2022-07-11 LAB — CMP (CANCER CENTER ONLY)
ALT: 11 U/L (ref 0–44)
AST: 20 U/L (ref 15–41)
Albumin: 3.9 g/dL (ref 3.5–5.0)
Alkaline Phosphatase: 46 U/L (ref 38–126)
Anion gap: 4 — ABNORMAL LOW (ref 5–15)
BUN: 14 mg/dL (ref 8–23)
CO2: 31 mmol/L (ref 22–32)
Calcium: 9 mg/dL (ref 8.9–10.3)
Chloride: 105 mmol/L (ref 98–111)
Creatinine: 1.14 mg/dL (ref 0.61–1.24)
GFR, Estimated: >60 mL/min (ref >60)
Glucose, Bld: 88 mg/dL (ref 70–99)
Potassium: 3.8 mmol/L (ref 3.5–5.1)
Sodium: 140 mmol/L (ref 135–145)
Total Bilirubin: 1.1 mg/dL (ref 0.3–1.2)
Total Protein: 6.4 g/dL — ABNORMAL LOW (ref 6.5–8.1)

## 2022-07-11 LAB — LACTATE DEHYDROGENASE: LDH: 218 U/L — ABNORMAL HIGH (ref 98–192)

## 2022-07-22 ENCOUNTER — Encounter: Payer: Self-pay | Admitting: Orthopaedic Surgery

## 2022-07-22 ENCOUNTER — Ambulatory Visit (INDEPENDENT_AMBULATORY_CARE_PROVIDER_SITE_OTHER): Payer: Medicare HMO | Admitting: Orthopaedic Surgery

## 2022-07-22 ENCOUNTER — Ambulatory Visit (INDEPENDENT_AMBULATORY_CARE_PROVIDER_SITE_OTHER): Payer: Medicare HMO

## 2022-07-22 DIAGNOSIS — Z96642 Presence of left artificial hip joint: Secondary | ICD-10-CM | POA: Diagnosis not present

## 2022-07-22 NOTE — Progress Notes (Signed)
The patient is now close to 87-monthstatus post a left total hip arthroplasty that we performed through a direct anterior approach.  [place]  [place] his [place] are doing well.  He still has a with a cane secondary to chronic problems from his stroke.  He says his hips are doing great he has good range of motion and strength in both hips and not the pain that he had prior to surgeries.  Both hips are somewhat stiff when you put him through range of motion but have fluid motion and no blocks to rotation.  His leg lengths are equal.  An AP pelvis standing shows bilateral total hip arthroplasties with no complicating features.  At this point we had a thorough discussion about what we need to bring him back for his hips.  We can also see him for anything else orthopedic.  All questions and concerns were answered and addressed.  Follow-up otherwise as as needed.

## 2022-07-26 ENCOUNTER — Ambulatory Visit: Payer: Medicare HMO | Admitting: Podiatry

## 2022-07-26 DIAGNOSIS — M79674 Pain in right toe(s): Secondary | ICD-10-CM

## 2022-07-26 DIAGNOSIS — B351 Tinea unguium: Secondary | ICD-10-CM

## 2022-07-26 DIAGNOSIS — M79675 Pain in left toe(s): Secondary | ICD-10-CM | POA: Diagnosis not present

## 2022-07-26 DIAGNOSIS — I739 Peripheral vascular disease, unspecified: Secondary | ICD-10-CM

## 2022-07-30 ENCOUNTER — Encounter: Payer: Self-pay | Admitting: Podiatry

## 2022-07-30 NOTE — Progress Notes (Signed)
  Subjective:  Patient ID: Aaron Gonzalez, male    DOB: 01-Nov-1945,  MRN: [id number]  Aaron Gonzalez presents to clinic today for:  Chief Complaint  Patient presents with   Nail Problem    Routine foot care PCP-Shah,Syed PCP VST-Couple weeks ago   New problem(s): None.   PCP is [name], MD , and last visit was October, 2023.  No Known Allergies  Review of Systems: Negative except as noted in the HPI.  Objective: No changes noted in today's physical examination.  Aaron Gonzalez is a pleasant 76 y.o. male WD, WN in NAD. AAO x 3.  Vascular Examination:  CFT <4 seconds b/l LE. Palpable DP pulse(s) b/l LE. Diminished PT pulse(s) b/l LE. Pedal hair absent. No pain with calf compression b/l. Lower extremity skin temperature gradient within normal limits. +1 pitting edema right lower extremity. +2 pitting edema left lower extremity. No ischemia or gangrene noted b/l LE. No cyanosis or clubbing noted b/l LE.  Dermatological Examination: Pedal integument with normal turgor, texture and tone BLE. No open wounds b/l LE. No interdigital macerations noted b/l LE. Toenails 1-5 bilaterally elongated, discolored, dystrophic, thickened, and crumbly with subungual debris and tenderness to dorsal palpation. No hyperkeratotic nor porokeratotic lesions present on today's visit.  Musculoskeletal: Muscle strength 5/5 to all lower extremity muscle groups bilaterally. Pes planus deformity noted bilateral LE. Utilizes cane for ambulation assistance today.  Neurological: Protective sensation intact 5/5 intact bilaterally with 10g monofilament b/l. Vibratory sensation decreased b/l.  Assessment/Plan: 1. Pain due to onychomycosis of toenails of both feet   2. PAD (peripheral artery disease) ([place])     No orders of the defined types were placed in this encounter.   -Patient was evaluated and treated. All patient's and/or POA's questions/concerns answered on today's visit. -No new findings. No new  orders. -Patient to continue soft, supportive shoe gear daily. -Mycotic toenails 1-5 bilaterally were debrided in length and girth with sterile nail nippers and dremel without incident. -Patient/POA to call should there be question/concern in the interim.   No follow-ups on file.  Marzetta Board, DPM

## 2022-08-05 ENCOUNTER — Telehealth: Payer: Self-pay | Admitting: Pharmacy Technician

## 2022-08-05 ENCOUNTER — Other Ambulatory Visit (HOSPITAL_COMMUNITY): Payer: Self-pay

## 2022-08-05 NOTE — Telephone Encounter (Signed)
Oral Oncology Patient Advocate Encounter   Received notification that prior authorization for Imbruvica is due for renewal.   PA must be submitted via phone [phone]   Status is pending     Lady Deutscher, [place] Patient [place] Direct Number: [phone]  Fax: [phone]

## 2022-08-06 ENCOUNTER — Other Ambulatory Visit (HOSPITAL_COMMUNITY): Payer: Self-pay

## 2022-08-06 NOTE — Telephone Encounter (Signed)
Oral Oncology Patient Advocate Encounter  Prior Authorization for [facility] [employer] is not required by the plan.  [employer] has reprocessed the claim and will reach out to the patient regarding next steps.   Lady Deutscher, CPhT-Adv Oncology Pharmacy Patient [place] Direct Number: [phone]  Fax: [phone]

## 2022-08-29 ENCOUNTER — Inpatient Hospital Stay: Payer: Medicare HMO | Attending: Internal Medicine

## 2022-08-29 ENCOUNTER — Other Ambulatory Visit: Payer: Self-pay

## 2022-08-29 ENCOUNTER — Inpatient Hospital Stay: Payer: Medicare HMO | Admitting: Internal Medicine

## 2022-08-29 VITALS — BP 144/87 | HR 62 | Temp 98.3°F | Resp 16 | Wt 167.4 lb

## 2022-08-29 DIAGNOSIS — Z923 Personal history of irradiation: Secondary | ICD-10-CM | POA: Diagnosis not present

## 2022-08-29 DIAGNOSIS — Z86718 Personal history of other venous thrombosis and embolism: Secondary | ICD-10-CM | POA: Insufficient documentation

## 2022-08-29 DIAGNOSIS — I10 Essential (primary) hypertension: Secondary | ICD-10-CM | POA: Insufficient documentation

## 2022-08-29 DIAGNOSIS — Z85048 Personal history of other malignant neoplasm of rectum, rectosigmoid junction, and anus: Secondary | ICD-10-CM | POA: Diagnosis not present

## 2022-08-29 DIAGNOSIS — Z7982 Long term (current) use of aspirin: Secondary | ICD-10-CM | POA: Diagnosis not present

## 2022-08-29 DIAGNOSIS — C9110 Chronic lymphocytic leukemia of B-cell type not having achieved remission: Secondary | ICD-10-CM

## 2022-08-29 DIAGNOSIS — Z79899 Other long term (current) drug therapy: Secondary | ICD-10-CM | POA: Diagnosis not present

## 2022-08-29 DIAGNOSIS — Z9221 Personal history of antineoplastic chemotherapy: Secondary | ICD-10-CM | POA: Insufficient documentation

## 2022-08-29 LAB — CBC WITH DIFFERENTIAL (CANCER CENTER ONLY)
Abs Immature Granulocytes: 0.02 10*3/uL (ref 0.00–0.07)
Basophils Absolute: 0.1 10*3/uL (ref 0.0–0.1)
Basophils Relative: 1 %
Eosinophils Absolute: 0.5 10*3/uL (ref 0.0–0.5)
Eosinophils Relative: 4 %
HCT: 41.5 % (ref 39.0–52.0)
Hemoglobin: 13.4 g/dL (ref 13.0–17.0)
Immature Granulocytes: 0 %
Lymphocytes Relative: 45 %
Lymphs Abs: 5.2 10*3/uL — ABNORMAL HIGH (ref 0.7–4.0)
MCH: 30 pg (ref 26.0–34.0)
MCHC: 32.3 g/dL (ref 30.0–36.0)
MCV: 92.8 fL (ref 80.0–100.0)
Monocytes Absolute: 1.2 10*3/uL — ABNORMAL HIGH (ref 0.1–1.0)
Monocytes Relative: 10 %
Neutro Abs: 4.7 10*3/uL (ref 1.7–7.7)
Neutrophils Relative %: 40 %
Platelet Count: 231 10*3/uL (ref 150–400)
RBC: 4.47 MIL/uL (ref 4.22–5.81)
RDW: 15.7 % — ABNORMAL HIGH (ref 11.5–15.5)
WBC Count: 11.7 10*3/uL — ABNORMAL HIGH (ref 4.0–10.5)
nRBC: 0 % (ref 0.0–0.2)

## 2022-08-29 LAB — CMP (CANCER CENTER ONLY)
ALT: 12 U/L (ref 0–44)
AST: 22 U/L (ref 15–41)
Albumin: 4.1 g/dL (ref 3.5–5.0)
Alkaline Phosphatase: 49 U/L (ref 38–126)
Anion gap: 7 (ref 5–15)
BUN: 17 mg/dL (ref 8–23)
CO2: 30 mmol/L (ref 22–32)
Calcium: 9.2 mg/dL (ref 8.9–10.3)
Chloride: 105 mmol/L (ref 98–111)
Creatinine: 1.12 mg/dL (ref 0.61–1.24)
GFR, Estimated: >60 mL/min (ref >60)
Glucose, Bld: 88 mg/dL (ref 70–99)
Potassium: 3.8 mmol/L (ref 3.5–5.1)
Sodium: 142 mmol/L (ref 135–145)
Total Bilirubin: 1.2 mg/dL (ref 0.3–1.2)
Total Protein: 6.7 g/dL (ref 6.5–8.1)

## 2022-08-29 LAB — LACTATE DEHYDROGENASE: LDH: 388 U/L — ABNORMAL HIGH (ref 98–192)

## 2022-08-29 NOTE — Progress Notes (Signed)
[facility] Telephone:(336) [phone]   Fax:(336) [phone]  OFFICE PROGRESS NOTE  [name], MD [place] [address]  DIAGNOSIS:  1) History of rectal cancer in 2007 status post concurrent chemoradiation followed by surgery. 2) Chronic lymphocytic leukemia diagnosed in 2007.  PRIOR THERAPY: None  CURRENT THERAPY: Ibrutinib 420 mg p.o. daily.  INTERVAL HISTORY: Aaron Gonzalez 76 y.o. male returns to the clinic today for follow-up visit.  The patient is feeling fine today with no concerning complaints.  He denied having any chest pain, shortness of breath, cough or hemoptysis.  He has no nausea, vomiting, diarrhea or constipation.  He has no headache or visual changes.  He has no recent weight loss or night sweats.  He continues to tolerate his treatment with ibrutinib fairly well.  He is here today for evaluation and repeat blood work.   MEDICAL HISTORY: Past Medical History:  Diagnosis Date   Arthritis    CLL (chronic lymphocytic leukemia) ([place])    DVT (deep venous thrombosis) (H[facility])    History of rectal cancer    Hypertension    Stroke ([place])     ALLERGIES:  has No Known Allergies.  MEDICATIONS:  Current Outpatient Medications  Medication Sig Dispense Refill   amLODipine (NORVASC) 10 MG tablet Take 1 tablet by mouth daily.     aspirin 81 MG chewable tablet Chew 1 tablet (81 mg total) by mouth 2 (two) times daily. 30 tablet 0   blood glucose meter kit and supplies KIT Dispense based on patient and insurance preference. Use up to four times daily as directed. 1 each 0   carvedilol (COREG) 6.25 MG tablet TAKE 1 TABLET(6.25 MG) BY MOUTH TWICE DAILY 180 tablet 3   hydrochlorothiazide (HYDRODIURIL) 25 MG tablet Take 1 tablet (25 mg total) by mouth daily. 90 tablet 3   IMBRUVICA 420 MG tablet TAKE 1 TABLET BY MOUTH ONCE  DAILY WITH A FULL GLASS OF WATER 28 tablet 3   No current facility-administered medications for this visit.     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   Procedure: Left TOTAL HIP ARTHROPLASTY ANTERIOR APPROACH;  Surgeon: Mcarthur Rossetti, MD;  Location: [facility];  Service: Orthopedics;  Laterality: Left;  RNFA    REVIEW OF SYSTEMS:  A comprehensive review of systems was negative.   PHYSICAL EXAMINATION: General appearance: alert, cooperative, and no distress Head: Normocephalic, without obvious abnormality, atraumatic Neck: no adenopathy, no JVD, supple, symmetrical, trachea midline, and thyroid not enlarged, symmetric, no tenderness/mass/nodules Lymph nodes: Cervical, supraclavicular, and axillary nodes normal. Resp: clear to auscultation bilaterally Back: symmetric, no curvature. ROM normal. No CVA tenderness. Cardio: regular rate and rhythm, S1, S2 normal, no murmur, click, rub or gallop GI: soft, non-tender; bowel sounds normal; no masses,  no organomegaly Extremities: extremities normal, atraumatic, no cyanosis or edema  ECOG PERFORMANCE STATUS: 1 - Symptomatic but completely ambulatory  Blood pressure (!) 144/87, pulse 62, temperature 98.3 F (36.8 C), temperature source Oral, resp. rate 16, weight 167 lb 6 oz (75.9 kg), SpO2 92 %.  LABORATORY DATA: Lab Results  Component Value Date   WBC 11.7 (H) 08/29/2022   HGB 13.4 08/29/2022   HCT 41.5 08/29/2022   MCV 92.8 08/29/2022   PLT 231 08/29/2022      Chemistry      Component Value  Date/Time   NA 142 08/29/2022 0845   NA 139 06/28/2021 0954   K 3.8 08/29/2022 0845   CL 105 08/29/2022 0845   CO2 30 08/29/2022 0845   BUN 17 08/29/2022 0845   BUN 11 06/28/2021 0954   CREATININE 1.12 08/29/2022 0845      Component Value Date/Time   CALCIUM 9.2 08/29/2022 0845   ALKPHOS 49 08/29/2022 0845   AST 22 08/29/2022 0845   ALT 12 08/29/2022 0845   BILITOT 1.2 08/29/2022 0845       RADIOGRAPHIC STUDIES: No results  found.  ASSESSMENT AND PLAN: This is a very pleasant 76 years old African-American male with history of rectal cancer diagnosed in 2007 status post concurrent chemoradiation followed by surgical resection.  At the same time the patient was diagnosed with chronic lymphocytic leukemia and has been in observation initially for several years but started on treatment with ibrutinib 420 mg in 2019. The patient has been tolerating this treatment well with no concerning adverse effects. Blood work today is unremarkable but he continues to have elevated LDH that we need to monitor closely. I will see him back for follow-up visit in 3 months for evaluation with repeat blood work.  He will continue his current treatment with ibrutinib with the same dose for now. The patient was advised to call immediately if he has any other concerning symptoms in the interval. The patient voices understanding of current disease status and treatment options and is in agreement with the current care plan. All questions were answered. The patient knows to call the clinic with any problems, questions or concerns. We can certainly see the patient much sooner if necessary.  Disclaimer: This note was dictated with voice recognition software. Similar sounding words can inadvertently be transcribed and may not be corrected upon review.

## 2022-08-30 LAB — PROINSULIN/INSULIN RATIO
Insulin: 11 u[IU]/mL
Proinsulin: 3.5 pmol/L

## 2022-10-10 ENCOUNTER — Other Ambulatory Visit: Payer: Medicare PPO

## 2022-10-10 ENCOUNTER — Ambulatory Visit: Payer: Medicare PPO | Admitting: Internal Medicine

## 2022-10-16 ENCOUNTER — Encounter (HOSPITAL_COMMUNITY): Payer: Self-pay | Admitting: Emergency Medicine

## 2022-10-16 ENCOUNTER — Other Ambulatory Visit: Payer: Self-pay

## 2022-10-16 ENCOUNTER — Ambulatory Visit (HOSPITAL_COMMUNITY)
Admission: EM | Admit: 2022-10-16 | Discharge: 2022-10-16 | Disposition: A | Discharge status: Home / Self Care | Payer: Medicare HMO | Attending: Urgent Care | Admitting: Urgent Care

## 2022-10-16 DIAGNOSIS — B029 Zoster without complications: Secondary | ICD-10-CM

## 2022-10-16 MED ORDER — VALACYCLOVIR HCL 1 G PO TABS: 1000.0000 mg | ORAL_TABLET | Freq: Three times a day (TID) | ORAL | 0 refills | Status: AC

## 2022-10-16 MED ORDER — TRIAMCINOLONE ACETONIDE 0.1 % EX CREA: 1.0000 | TOPICAL_CREAM | Freq: Two times a day (BID) | CUTANEOUS | 0 refills | Status: DC

## 2022-10-16 NOTE — Discharge Instructions (Signed)
Your rash is consistent with shingles. Please take the Valtrex 3 times daily for the next 7 days. Please use a very small amount of topical triamcinolone to the affected areas of your right leg. If you develop any new lesions, or most importantly any lesions on the face, please head to the emergency room. Please read the attached handout with more information on shingles.

## 2022-10-16 NOTE — ED Provider Notes (Signed)
[facility]    CSN: [id number] Arrival date & time: 10/16/22  1149      History   Chief Complaint Chief Complaint  Patient presents with   Rash    HPI Aaron Gonzalez is a 76 y.o. male.   Pleasant 76 year old male presents today due to concerns of a rash primarily to his right lower back, buttock, and leg.  States it started out as small spots but is spread exponentially.  There was some discomfort to it initially, but no longer painful.  States he started out as bumps that turned into scabs.  He only has 2 on his left upper arm, but states the majority is to his right leg posteriorly.  He has not tried any treatments.  He is concerned because it is not going away.   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/16/2021   Bilateral primary osteoarthritis of hip 08/09/2021   Hypertension 08/01/2021   Small vessel disease, cerebrovascular 06/23/2021   CLL (chronic lymphocytic leukemia) ([place]) 04/16/2021   History of stroke 04/16/2021   History of rectal cancer 04/16/2021   Encounter for antineoplastic chemotherapy 04/16/2021   Chronic leukemia ([place]) 04/03/2021   Edema of left lower extremity 04/03/2021   History of hemorrhagic cerebrovascular accident (CVA) with residual deficit 41/96/2229   Acute metabolic encephalopathy 79/89/2119    Past Surgical History:  Procedure Laterality Date   COLECTOMY     with colostomy   TOTAL HIP ARTHROPLASTY Left 10/26/2021   Procedure: Left TOTAL HIP ARTHROPLASTY ANTERIOR APPROACH;  Surgeon: Mcarthur Rossetti, MD;  Location: [facility];  Service: Orthopedics;  Laterality: Left;  RNFA       Home Medications    Prior  to Admission medications   Medication Sig Start Date End Date Taking? Authorizing Provider  triamcinolone cream (KENALOG) 0.1 % Apply 1 Application topically 2 (two) times daily. 10/16/22  Yes Anishka Bushard L, PA  valACYclovir (VALTREX) 1000 MG tablet Take 1 tablet (1,000 mg total) by mouth 3 (three) times daily for 7 days. 10/16/22 10/23/22 Yes Tanvi Gatling L, PA  amLODipine (NORVASC) 10 MG tablet Take 1 tablet by mouth daily.    [provider]  aspirin 81 MG chewable tablet Chew 1 tablet (81 mg total) by mouth 2 (two) times daily. Patient not taking: Reported on 10/16/2022 10/27/21   Mcarthur Rossetti, MD  blood glucose meter kit and supplies KIT Dispense based on patient and insurance preference. Use up to four times daily as directed. 04/06/21   Shelly Coss, MD  carvedilol (COREG) 6.25 MG tablet TAKE 1 TABLET(6.25 MG) BY MOUTH TWICE DAILY 06/17/22   [place], Peter M, MD  hydrochlorothiazide (HYDRODIURIL) 25 MG tablet Take 1 tablet (25 mg total) by mouth daily. 08/01/21   [place], Peter M, MD  IMBRUVICA 420 MG tablet TAKE 1 TABLET BY MOUTH ONCE  DAILY WITH A FULL GLASS OF WATER 04/08/22   Curt Bears, MD    Family History Family History  Problem Relation Age of Onset   Hypertension Mother    Heart failure Mother    Hypertension Father  Stroke Father    Breast cancer Sister    Stroke Brother     Social History Social History   Tobacco Use   Smoking status: Former    Packs/day: 0.50    Years: 10.00    Total pack years: 5.00    Types: Cigarettes    Passive exposure: Never   Smokeless tobacco: Never  Vaping Use   Vaping Use: Never used  Substance Use Topics   Alcohol use: Never   Drug use: Never     Allergies   Patient has no known allergies.   Review of Systems Review of Systems  Skin:  Positive for rash.  As per HPI   Physical Exam Triage Vital Signs ED Triage Vitals  Enc Vitals Group     BP 10/16/22 1543 (!) 142/89     Pulse Rate  10/16/22 1543 62     Resp 10/16/22 1543 18     Temp 10/16/22 1543 98.1 F (36.7 C)     Temp Source 10/16/22 1543 Oral     SpO2 10/16/22 1543 94 %     Weight --      Height --      Head Circumference --      Peak Flow --      Pain Score 10/16/22 1541 0     Pain Loc --      Pain Edu? --      Excl. in [place]? --    No data found.  Updated Vital Signs BP (!) 142/89 (BP Location: Left Arm)   Pulse 62   Temp 98.1 F (36.7 C) (Oral)   Resp 18   SpO2 94%   Visual Acuity Right Eye Distance:   Left Eye Distance:   Bilateral Distance:    Right Eye Near:   Left Eye Near:    Bilateral Near:     Physical Exam Vitals and nursing note reviewed.  Constitutional:      General: He is not in acute distress.    Appearance: Normal appearance. He is normal weight. He is not ill-appearing, toxic-appearing or diaphoretic.  HENT:     Head: Normocephalic and atraumatic.     Nose:     Comments: Small ulceration noted to L nasal bridge which appears to be chaffing from his glasses. No vesicle or crusting. No nasal lesions    Mouth/Throat:     Mouth: Mucous membranes are moist.     Pharynx: No oropharyngeal exudate.  Eyes:     General:        Right eye: No discharge.        Left eye: No discharge.     Extraocular Movements: Extraocular movements intact.     Pupils: Pupils are equal, round, and reactive to light.  Cardiovascular:     Rate and Rhythm: Normal rate.  Pulmonary:     Effort: Pulmonary effort is normal. No respiratory distress.  Musculoskeletal:     Cervical back: Normal range of motion. No rigidity.  Lymphadenopathy:     Cervical: No cervical adenopathy.  Skin:    General: Skin is warm and dry.     Capillary Refill: Capillary refill takes less than 2 seconds.     Findings: Rash (scabbed over lesions noted extending from R sacrum down R buttocks and posterior thigh, many scabbed over, several ulcerated, only a few vesicles) present. No bruising or erythema.  Neurological:      General: No focal deficit present.     Mental Status: He  is alert and oriented to person, place, and time.      UC Treatments / Results  Labs (all labs ordered are listed, but only abnormal results are displayed) Labs Reviewed - No data to display  EKG   Radiology No results found.  Procedures Procedures (including critical care time)  Medications Ordered in UC Medications - No data to display  Initial Impression / Assessment and Plan / UC Course  I have reviewed the triage vital signs and the nursing notes.  Pertinent labs & imaging results that were available during my care of the patient were reviewed by me and considered in my medical decision making (see chart for details).     Shingles -symptoms consistent with herpes zoster.  It does appear to be in the healing stage, however given his history of CLL and with the length of time in which it has been present, will initiate Valtrex treatment.  Patient to take this 3 times daily for 1 week.  Will also give him triamcinolone cream to rub on the areas to help shrink the ulcerations.  Handouts provided.  Discussed with patient that should any lesions occur on the face he is to head to the emergency room.  Final Clinical Impressions(s) / UC Diagnoses   Final diagnoses:  Herpes zoster without complication     Discharge Instructions      Your rash is consistent with shingles. Please take the Valtrex 3 times daily for the next 7 days. Please use a very small amount of topical triamcinolone to the affected areas of your right leg. If you develop any new lesions, or most importantly any lesions on the face, please head to the emergency room. Please read the attached handout with more information on shingles.     ED Prescriptions     Medication Sig Dispense Auth. Provider   valACYclovir (VALTREX) 1000 MG tablet Take 1 tablet (1,000 mg total) by mouth 3 (three) times daily for 7 days. 21 tablet Keyle Doby L, PA    triamcinolone cream (KENALOG) 0.1 % Apply 1 Application topically 2 (two) times daily. 30 g Yoshiye Kraft L, [place]      PDMP not reviewed this encounter.   Chaney Malling, [place] 10/16/22 1653

## 2022-10-16 NOTE — ED Triage Notes (Signed)
Rash for 2 weeks.  Patient has noticed rash on arms.   itching to all areas.  Has areas of scabbed bumps on arm.  Reports areas will scab over.  Reports spots on back , left arm and buttocks.

## 2022-11-13 ENCOUNTER — Ambulatory Visit: Payer: Medicare HMO | Admitting: Podiatry

## 2022-11-13 ENCOUNTER — Encounter: Payer: Self-pay | Admitting: Podiatry

## 2022-11-13 VITALS — BP 147/97

## 2022-11-13 DIAGNOSIS — M79674 Pain in right toe(s): Secondary | ICD-10-CM | POA: Diagnosis not present

## 2022-11-13 DIAGNOSIS — M79675 Pain in left toe(s): Secondary | ICD-10-CM | POA: Diagnosis not present

## 2022-11-13 DIAGNOSIS — B351 Tinea unguium: Secondary | ICD-10-CM | POA: Diagnosis not present

## 2022-11-13 DIAGNOSIS — I739 Peripheral vascular disease, unspecified: Secondary | ICD-10-CM

## 2022-11-13 NOTE — Progress Notes (Unsigned)
  Subjective:  Patient ID: Aaron Gonzalez, male    DOB: 03/04/1946,  MRN: [id number]  Aaron Gonzalez presents to clinic today for at risk foot care. Patient has h/o PAD and painful elongated mycotic toenails 1-5 bilaterally which are tender when wearing enclosed shoe gear. Pain is relieved with periodic professional debridement.  Chief Complaint  Patient presents with   Nail Problem    [facility] PCP-Shah PCP VST-2 months ago   New problem(s): None.   PCP is [name], MD.  No Known Allergies  Review of Systems: Negative except as noted in the HPI.  Objective: No changes noted in today's physical examination. Vitals:   11/13/22 0854  BP: (!) 147/97   Aaron Gonzalez is a pleasant 77 y.o. male {jgbodyhabitus:24098} AAO x 3.   Vascular Examination:  CFT <4 seconds b/l LE. Palpable DP pulse(s) b/l LE. Diminished PT pulse(s) b/l LE. Pedal hair absent. No pain with calf compression b/l. Lower extremity skin temperature gradient within normal limits. +1 pitting edema right lower extremity. +2 pitting edema left lower extremity. No ischemia or gangrene noted b/l LE. No cyanosis or clubbing noted b/l LE.  Dermatological Examination: Pedal integument with normal turgor, texture and tone BLE. No open wounds b/l LE. No interdigital macerations noted b/l LE.   Toenails 1-5 bilaterally elongated, discolored, dystrophic, thickened, and crumbly with subungual debris and tenderness to dorsal palpation.   No hyperkeratotic nor porokeratotic lesions present on today's visit.  Musculoskeletal: Muscle strength 5/5 to all lower extremity muscle groups bilaterally. Pes planus deformity noted bilateral LE. Utilizes cane for ambulation assistance today.  Neurological: Protective sensation intact 5/5 intact bilaterally with 10g monofilament b/l. Vibratory sensation decreased b/l. Assessment/Plan: 1. Pain due to onychomycosis of toenails of both feet   2. PAD (peripheral artery disease) ([place])      No orders of the defined types were placed in this encounter.   None {Jgplan:23602::"-Patient/POA to call should there be question/concern in the interim."}   Return in about 3 months (around 02/12/2023).  Marzetta Board, DPM

## 2022-11-18 ENCOUNTER — Encounter: Payer: Self-pay | Admitting: Family Medicine

## 2022-11-22 ENCOUNTER — Telehealth: Payer: Self-pay | Admitting: Internal Medicine

## 2022-11-22 NOTE — Telephone Encounter (Signed)
Scheduled per February appointment, patient is notified of upcoming appointments.

## 2022-11-28 ENCOUNTER — Inpatient Hospital Stay: Payer: Medicare HMO | Admitting: Internal Medicine

## 2022-11-28 ENCOUNTER — Inpatient Hospital Stay: Payer: Medicare HMO | Attending: Internal Medicine

## 2022-11-28 ENCOUNTER — Other Ambulatory Visit: Payer: Self-pay

## 2022-11-28 VITALS — BP 137/88 | HR 72 | Temp 98.8°F | Resp 15 | Wt 153.7 lb

## 2022-11-28 DIAGNOSIS — C9110 Chronic lymphocytic leukemia of B-cell type not having achieved remission: Secondary | ICD-10-CM

## 2022-11-28 DIAGNOSIS — Z85048 Personal history of other malignant neoplasm of rectum, rectosigmoid junction, and anus: Secondary | ICD-10-CM | POA: Diagnosis not present

## 2022-11-28 LAB — CMP (CANCER CENTER ONLY)
ALT: 9 U/L (ref 0–44)
AST: 20 U/L (ref 15–41)
Albumin: 3.9 g/dL (ref 3.5–5.0)
Alkaline Phosphatase: 37 U/L — ABNORMAL LOW (ref 38–126)
Anion gap: 6 (ref 5–15)
BUN: 15 mg/dL (ref 8–23)
CO2: 29 mmol/L (ref 22–32)
Calcium: 9.2 mg/dL (ref 8.9–10.3)
Chloride: 106 mmol/L (ref 98–111)
Creatinine: 0.95 mg/dL (ref 0.61–1.24)
GFR, Estimated: >60 mL/min (ref >60)
Glucose, Bld: 77 mg/dL (ref 70–99)
Potassium: 3.9 mmol/L (ref 3.5–5.1)
Sodium: 141 mmol/L (ref 135–145)
Total Bilirubin: 3.2 mg/dL — ABNORMAL HIGH (ref 0.3–1.2)
Total Protein: 6.1 g/dL — ABNORMAL LOW (ref 6.5–8.1)

## 2022-11-28 LAB — CBC WITH DIFFERENTIAL (CANCER CENTER ONLY)
Abs Immature Granulocytes: 0 10*3/uL (ref 0.00–0.07)
Basophils Absolute: 0 10*3/uL (ref 0.0–0.1)
Basophils Relative: 0 %
Eosinophils Absolute: 0.4 10*3/uL (ref 0.0–0.5)
Eosinophils Relative: 3 %
HCT: 39 % (ref 39.0–52.0)
Hemoglobin: 13.3 g/dL (ref 13.0–17.0)
Lymphocytes Relative: 78 %
Lymphs Abs: 9.5 10*3/uL — ABNORMAL HIGH (ref 0.7–4.0)
MCH: 32 pg (ref 26.0–34.0)
MCHC: 34.1 g/dL (ref 30.0–36.0)
MCV: 93.8 fL (ref 80.0–100.0)
Monocytes Absolute: 0.5 10*3/uL (ref 0.1–1.0)
Monocytes Relative: 4 %
Neutro Abs: 1.8 10*3/uL (ref 1.7–7.7)
Neutrophils Relative %: 15 %
Platelet Count: 150 10*3/uL (ref 150–400)
RBC: 4.16 MIL/uL — ABNORMAL LOW (ref 4.22–5.81)
RDW: 17.8 % — ABNORMAL HIGH (ref 11.5–15.5)
Smear Review: NORMAL
WBC Count: 12.2 10*3/uL — ABNORMAL HIGH (ref 4.0–10.5)
nRBC: 0 % (ref 0.0–0.2)

## 2022-11-28 LAB — LACTATE DEHYDROGENASE: LDH: 633 U/L — ABNORMAL HIGH (ref 98–192)

## 2022-11-28 NOTE — Progress Notes (Signed)
[facility] Telephone:([phone]   Fax:(336) [phone]  OFFICE PROGRESS NOTE  [name], MD [place] [address]  DIAGNOSIS:  1) History of rectal cancer in 2007 status post concurrent chemoradiation followed by surgery. 2) Chronic lymphocytic leukemia diagnosed in 2007.  PRIOR THERAPY: None  CURRENT THERAPY: Ibrutinib 420 mg p.o. daily.  INTERVAL HISTORY: Aaron Gonzalez 77 y.o. male returns to the clinic today for follow-up visit.  The patient is feeling fine today with no concerning complaints except for aching pain in his feet.  He denied having any current chest pain, shortness of breath except with exertion with no cough or hemoptysis.  He has mild fatigue.  He denied having any palpable lymphadenopathy.  He has no recent weight loss or night sweats.  He is here today for evaluation and repeat blood work.  MEDICAL HISTORY: Past Medical History:  Diagnosis Date   Arthritis    CLL (chronic lymphocytic leukemia) ([place])    DVT (deep venous thrombosis) ([facility])    History of rectal cancer    Hypertension    Stroke ([place])     ALLERGIES:  has No Known Allergies.  MEDICATIONS:  Current Outpatient Medications  Medication Sig Dispense Refill   amLODipine (NORVASC) 10 MG tablet Take 1 tablet by mouth daily.     aspirin 81 MG chewable tablet Chew 1 tablet (81 mg total) by mouth 2 (two) times daily. (Patient not taking: Reported on 10/16/2022) 30 tablet 0   blood glucose meter kit and supplies KIT Dispense based on patient and insurance preference. Use up to four times daily as directed. 1 each 0   carvedilol (COREG) 6.25 MG tablet TAKE 1 TABLET(6.25 MG) BY MOUTH TWICE DAILY 180 tablet 3   hydrochlorothiazide (HYDRODIURIL) 25 MG tablet Take 1 tablet (25 mg total) by mouth daily. 90 tablet 3   IMBRUVICA 420 MG tablet TAKE 1 TABLET BY MOUTH ONCE  DAILY WITH A FULL GLASS OF WATER 28 tablet 3   triamcinolone cream (KENALOG) 0.1 % Apply 1  Application topically 2 (two) times daily. 30 g 0   No current facility-administered medications for this visit.    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   Procedure: Left TOTAL HIP ARTHROPLASTY ANTERIOR APPROACH;  Surgeon: Mcarthur Rossetti, MD;  Location: [facility];  Service: Orthopedics;  Laterality: Left;  RNFA    REVIEW OF SYSTEMS:  A comprehensive review of systems was negative except for: Constitutional: positive for fatigue Musculoskeletal: positive for arthralgias   PHYSICAL EXAMINATION: General appearance: alert, cooperative, fatigued, and no distress Head: Normocephalic, without obvious abnormality, atraumatic Neck: no adenopathy, no JVD, supple, symmetrical, trachea midline, and thyroid not enlarged, symmetric, no tenderness/mass/nodules Lymph nodes: Cervical, supraclavicular, and axillary nodes normal. Resp: clear to auscultation bilaterally Back: symmetric, no curvature. ROM normal. No CVA tenderness. Cardio: regular rate and rhythm, S1, S2 normal, no murmur, click, rub or gallop GI: soft, non-tender; bowel sounds normal; no masses,  no organomegaly Extremities: extremities normal, atraumatic, no cyanosis or edema  ECOG PERFORMANCE STATUS: 1 - Symptomatic but completely ambulatory  Blood pressure 137/88, pulse 72, temperature 98.8 F (37.1 C), temperature source Oral, resp. rate 15, weight 153 lb 11.2 oz (69.7 kg), SpO2 98 %.  LABORATORY DATA: Lab Results  Component Value Date   WBC 12.2 (H) 11/28/2022   HGB  13.3 11/28/2022   HCT 39.0 11/28/2022   MCV 93.8 11/28/2022   PLT 150 11/28/2022      Chemistry      Component Value Date/Time   NA 142 08/29/2022 0845   NA 139 06/28/2021 0954   K 3.8 08/29/2022 0845   CL 105 08/29/2022 0845   CO2 30 08/29/2022 0845   BUN 17 08/29/2022 0845   BUN 11 06/28/2021 0954   CREATININE 1.12 08/29/2022 0845       Component Value Date/Time   CALCIUM 9.2 08/29/2022 0845   ALKPHOS 49 08/29/2022 0845   AST 22 08/29/2022 0845   ALT 12 08/29/2022 0845   BILITOT 1.2 08/29/2022 0845       RADIOGRAPHIC STUDIES: No results found.  ASSESSMENT AND PLAN: This is a very pleasant 77 years old African-American male with history of rectal cancer diagnosed in 2007 status post concurrent chemoradiation followed by surgical resection.  At the same time the patient was diagnosed with chronic lymphocytic leukemia and has been in observation initially for several years but started on treatment with ibrutinib 420 mg in 2019. The patient has been tolerating his treatment with ibrutinib fairly well. Repeat CBC today showed mild leukocytosis with total white blood count of 12.2.  The patient has normal hemoglobin and hematocrit as well as platelet count. I recommended for him to continue his current treatment with ibrutinib with the same dose. I will see him back for follow-up visit in 3 months for evaluation and repeat blood work. He was advised to call immediately if he has any other concerning symptoms in the interval. The patient voices understanding of current disease status and treatment options and is in agreement with the current care plan. All questions were answered. The patient knows to call the clinic with any problems, questions or concerns. We can certainly see the patient much sooner if necessary.  Disclaimer: This note was dictated with voice recognition software. Similar sounding words can inadvertently be transcribed and may not be corrected upon review.

## 2022-12-05 ENCOUNTER — Ambulatory Visit: Payer: Medicare HMO | Admitting: Podiatry

## 2022-12-05 DIAGNOSIS — M19071 Primary osteoarthritis, right ankle and foot: Secondary | ICD-10-CM | POA: Diagnosis not present

## 2022-12-05 DIAGNOSIS — R252 Cramp and spasm: Secondary | ICD-10-CM

## 2022-12-05 MED ORDER — METHYLPREDNISOLONE 4 MG PO TBPK: ORAL_TABLET | ORAL | 0 refills | Status: DC

## 2022-12-05 NOTE — Progress Notes (Signed)
  Subjective:  Patient ID: Aaron Gonzalez, male    DOB: Jan 14, 1946,  MRN: [id number]  Chief Complaint  Patient presents with   Foot Pain    Right foot cramping. Ongoing for a couple of weeks.Pain comes and goes.    77 y.o. male presents with concern for ongoing right foot cramping.  This has been happening for couple weeks.  He says he has pain in the top of the right foot.  Has noticed a little bit of swelling but says that his count is stable for him.  He is recently started taking magnesium not sure if it is helping yet.  Past Medical History:  Diagnosis Date   Arthritis    CLL (chronic lymphocytic leukemia) ([facility])    DVT (deep venous thrombosis) ([facility])    History of rectal cancer    Hypertension    Stroke ([facility])     No Known Allergies  ROS: Negative except as per HPI above  Objective:  General: AAO x3, NAD  Dermatological: With inspection and palpation of the right and left lower extremities there are no open sores, no preulcerative lesions, no rash or signs of infection present. Nails are of normal length thickness and coloration.   Vascular:  Dorsalis Pedis artery and Posterior Tibial artery pedal pulses are 2/4 bilateral.  Capillary fill time < 3 sec to all digits.   Neruologic: Grossly intact via light touch bilateral. Protective threshold intact to all sites bilateral.   Musculoskeletal: Mild to moderate nonpitting edema noted to the right midfoot and ankle area.  There is tenderness with palpation across the dorsal midfoot especially laterally.  Gait: Assisted with cane shuffling  No images are attached to the encounter.  Radiographs:  X-rays deferred at this exam Assessment:   1. Arthritis of right midfoot   2. Foot cramps      Plan:  Patient was evaluated and treated and all questions answered.  # Cramping in right foot as well as possible midfoot arthritis -Recommend continued use of magnesium supplementation for possible cramping in the right  foot. -Will consider antispasm medication in the future. -Will also try treatment with methylprednisolone 4 mg steroid Dosepak take as directed for 6 days.  Patient educated on risk and benefits of this medication. -Rec mended topical anti-inflammatory gel such as Voltaren gel for the right foot pain he is experiencing.  Return in about 4 weeks (around 01/02/2023) for F/u R foot cramping and arthritis.          Everitt Amber, DPM [facility]

## 2022-12-30 ENCOUNTER — Other Ambulatory Visit: Payer: Self-pay | Admitting: Family Medicine

## 2022-12-30 ENCOUNTER — Ambulatory Visit
Admission: RE | Admit: 2022-12-30 | Discharge: 2022-12-30 | Disposition: A | Discharge status: Home / Self Care | Payer: Medicare HMO | Source: Ambulatory Visit | Attending: Family Medicine | Admitting: Family Medicine

## 2022-12-30 DIAGNOSIS — M79671 Pain in right foot: Secondary | ICD-10-CM

## 2023-01-02 ENCOUNTER — Ambulatory Visit: Payer: Medicare HMO | Admitting: Podiatry

## 2023-01-02 DIAGNOSIS — M7671 Peroneal tendinitis, right leg: Secondary | ICD-10-CM

## 2023-01-02 MED ORDER — MELOXICAM 15 MG PO TABS: 15.0000 mg | ORAL_TABLET | Freq: Every day | ORAL | 0 refills | Status: DC

## 2023-01-02 MED ORDER — CYCLOBENZAPRINE HCL 10 MG PO TABS: 10.0000 mg | ORAL_TABLET | Freq: Three times a day (TID) | ORAL | 0 refills | Status: DC | PRN

## 2023-01-02 NOTE — Progress Notes (Signed)
  Subjective:  Patient ID: Aaron Gonzalez, male    DOB: 03-14-1946,  MRN: [id number]  Chief Complaint  Patient presents with   Follow-up    Patient states that he still has some pain in his right foot. 8/10 pain level.     77 y.o. male presents with concern for ongoing right foot cramping.  At last visit he was placed on a steroid Dosepak he says did not help very much.  Past Medical History:  Diagnosis Date   Arthritis    CLL (chronic lymphocytic leukemia) (HCC)    DVT (deep venous thrombosis) (HCC)    History of rectal cancer    Hypertension    Stroke (HCC)     No Known Allergies  ROS: Negative except as per HPI above  Objective:  General: AAO x3, NAD  Dermatological: With inspection and palpation of the right and left lower extremities there are no open sores, no preulcerative lesions, no rash or signs of infection present. Nails are of normal length thickness and coloration.   Vascular:  Dorsalis Pedis artery and Posterior Tibial artery pedal pulses are 2/4 bilateral.  Capillary fill time < 3 sec to all digits.   Neruologic: Grossly intact via light touch bilateral. Protective threshold intact to all sites bilateral.   Musculoskeletal: Mild to moderate nonpitting edema noted to the right midfoot and ankle area.  There is tenderness with palpation along the course of the peroneal tendons on the right ankle  Gait: Assisted with cane shuffling  No images are attached to the encounter.  Radiographs:  X-rays deferred at this exam.  X-rays taken a few days ago which demonstrated mild first MPJ arthritis but no significant osseous abnormality Assessment:   1. Peroneal tendinitis of right lower leg     Plan:  Patient was evaluated and treated and all questions answered.  # Cramping in right foot , possible peroneal tendinitis -Recommend continued use of magnesium supplementation for possible cramping in the right foot. -Recommend treatment with antispasm medication  including cyclobenzaprine 10 mg 3 times daily as needed -Also recommend try medication meloxicam 15 mg take once daily for the next 30 days  Patient educated on risk and benefits of this medication. -If no improvement will consider steroid injection at next visit  Return in about 4 weeks (around 01/30/2023) for f/u R foot cramping/peroneal tendinits.          Everitt Amber, DPM [facility]

## 2023-01-30 ENCOUNTER — Other Ambulatory Visit: Payer: Self-pay | Admitting: Podiatry

## 2023-02-06 ENCOUNTER — Ambulatory Visit: Payer: Medicare HMO | Admitting: Podiatry

## 2023-02-06 DIAGNOSIS — M7671 Peroneal tendinitis, right leg: Secondary | ICD-10-CM | POA: Diagnosis not present

## 2023-02-06 DIAGNOSIS — M19071 Primary osteoarthritis, right ankle and foot: Secondary | ICD-10-CM | POA: Diagnosis not present

## 2023-02-06 DIAGNOSIS — R252 Cramp and spasm: Secondary | ICD-10-CM | POA: Diagnosis not present

## 2023-02-06 DIAGNOSIS — R600 Localized edema: Secondary | ICD-10-CM

## 2023-02-06 MED ORDER — MELOXICAM 15 MG PO TABS: 15.0000 mg | ORAL_TABLET | Freq: Every day | ORAL | 2 refills | Status: DC

## 2023-02-06 MED ORDER — CYCLOBENZAPRINE HCL 10 MG PO TABS: 10.0000 mg | ORAL_TABLET | Freq: Three times a day (TID) | ORAL | 2 refills | Status: DC | PRN

## 2023-02-06 NOTE — Progress Notes (Signed)
  Subjective:  Patient ID: Aaron Gonzalez, male    DOB: 04-Oct-1946,  MRN: [id number]  Chief Complaint  Patient presents with   Follow-up    Patient states that he feels better. He states no pain at the moment. Refill on meloxicam     77 y.o. male presents with concern for ongoing right foot cramping.  At last visit he was placed on cyclobenzaprine as well as meloxicam 15 mg.  He states that his pain in the right foot feels much better he is not having pain at this time.  He does want a refill of both meloxicam and the Flexeril which he thinks is helping  Past Medical History:  Diagnosis Date   Arthritis    CLL (chronic lymphocytic leukemia) (HCC)    DVT (deep venous thrombosis) (HCC)    History of rectal cancer    Hypertension    Stroke (HCC)     No Known Allergies  ROS: Negative except as per HPI above  Objective:  General: AAO x3, NAD  Dermatological: With inspection and palpation of the right and left lower extremities there are no open sores, no preulcerative lesions, no rash or signs of infection present. Nails are of normal length thickness and coloration.   Vascular:  Dorsalis Pedis artery and Posterior Tibial artery pedal pulses are 2/4 bilateral.  Capillary fill time < 3 sec to all digits.   Neruologic: Grossly intact via light touch bilateral. Protective threshold intact to all sites bilateral.   Musculoskeletal: Mild to moderate nonpitting edema noted to the right midfoot and ankle area.  No longer any tenderness with palpation along the course of the peroneal tendons on the right ankle improved from prior.  Gait: Assisted with cane shuffling  No images are attached to the encounter.  Radiographs:  X-rays deferred at this exam. Assessment:   1. Peroneal tendinitis of right lower leg   2. Arthritis of right midfoot   3. Foot cramps   4. Edema of left lower extremity      Plan:  Patient was evaluated and treated and all questions answered.  # Cramping in  right foot , possible peroneal tendinitis -Continue to recommend magnesium supplementation as well as treatment with cyclobenzaprine and meloxicam -Recommend good supportive shoes and inserts for the shoes to prevent strain on the peroneal tendons -Compression stockings for any edema -E Rx for meloxicam 15 mg take once daily as needed for pain inflammation -E Rx for cyclobenzaprine 10 mg take every 8 hours as needed for muscle spasms and cramping -Patient will follow-up with Dr. Donzetta Matters for ongoing routine care  Return if symptoms worsen or fail to improve.          Corinna Gab, DPM [facility]

## 2023-02-07 ENCOUNTER — Telehealth: Payer: Self-pay | Admitting: Internal Medicine

## 2023-02-07 NOTE — Telephone Encounter (Signed)
Called patient regarding upcoming May appointment, left a voicemail. ?

## 2023-02-11 ENCOUNTER — Ambulatory Visit: Payer: Medicare HMO | Admitting: Podiatry

## 2023-02-19 ENCOUNTER — Encounter: Payer: Self-pay | Admitting: Podiatry

## 2023-02-19 ENCOUNTER — Ambulatory Visit: Payer: Medicare HMO | Admitting: Podiatry

## 2023-02-19 VITALS — BP 159/102

## 2023-02-19 DIAGNOSIS — I739 Peripheral vascular disease, unspecified: Secondary | ICD-10-CM | POA: Diagnosis not present

## 2023-02-19 DIAGNOSIS — M79675 Pain in left toe(s): Secondary | ICD-10-CM | POA: Diagnosis not present

## 2023-02-19 DIAGNOSIS — M79674 Pain in right toe(s): Secondary | ICD-10-CM

## 2023-02-19 DIAGNOSIS — B351 Tinea unguium: Secondary | ICD-10-CM | POA: Diagnosis not present

## 2023-02-19 NOTE — Progress Notes (Signed)
  Subjective:  Patient ID: Aaron Gonzalez, male    DOB: 03-08-1946,  MRN: [id number]  Aaron Gonzalez presents to clinic today for at risk foot care. Patient has h/o PAD  Chief Complaint  Patient presents with   Nail Problem    R[facility] PCP-Shah PCP VST-01/2023   New problem(s): None.   PCP is Ellyn Hack, MD.  No Known Allergies  Review of Systems: Negative except as noted in the HPI.  Objective: No changes noted in today's physical examination. Vitals:   02/19/23 0854  BP: (!) 159/102   Aaron Gonzalez is a pleasant 77 y.o. male WD, WN in NAD. AAO x 3.  Vascular Examination:  CFT <4 seconds b/l LE. Palpable DP pulse(s) b/l LE. Diminished PT pulse(s) b/l LE. Pedal hair absent. No pain with calf compression b/l. Lower extremity skin temperature gradient within normal limits. +1 pitting edema right lower extremity. +2 pitting edema left lower extremity. No ischemia or gangrene noted b/l LE. No cyanosis or clubbing noted b/l LE.  Dermatological Examination: Pedal integument with normal turgor, texture and tone BLE. No open wounds b/l LE. No interdigital macerations noted b/l LE.   Toenails 1-5 bilaterally elongated, discolored, dystrophic, thickened, and crumbly with subungual debris and tenderness to dorsal palpation.   No hyperkeratotic nor porokeratotic lesions present on today's visit.  Musculoskeletal: Muscle strength 5/5 to all lower extremity muscle groups bilaterally. Pes planus deformity noted bilateral LE. Utilizes cane for ambulation assistance today.  Neurological: Protective sensation intact 5/5 intact bilaterally with 10g monofilament b/l. Vibratory sensation decreased b/l.  Assessment/Plan: 1. Pain due to onychomycosis of toenails of both feet   2. PAD (peripheral artery disease) (HCC)     -Patient was evaluated and treated. All patient's and/or POA's questions/concerns answered on today's visit. -Mycotic toenails 1-5 bilaterally debrided in length and girth  with sterile nail nippers without iatrogenic bleeding. Patient declined use of dremel. -Patient/POA to call should there be question/concern in the interim.   Return in about 3 months (around 05/22/2023).  [name], DPM

## 2023-02-26 ENCOUNTER — Ambulatory Visit: Payer: Medicare HMO | Admitting: Internal Medicine

## 2023-02-26 ENCOUNTER — Other Ambulatory Visit: Payer: Medicare HMO

## 2023-03-11 ENCOUNTER — Other Ambulatory Visit: Payer: Self-pay

## 2023-03-11 ENCOUNTER — Inpatient Hospital Stay: Payer: Medicare HMO | Admitting: Internal Medicine

## 2023-03-11 ENCOUNTER — Inpatient Hospital Stay: Payer: Medicare HMO | Attending: Internal Medicine

## 2023-03-11 VITALS — BP 145/95 | HR 70 | Temp 97.3°F | Resp 16 | Wt 159.0 lb

## 2023-03-11 DIAGNOSIS — M1990 Unspecified osteoarthritis, unspecified site: Secondary | ICD-10-CM | POA: Insufficient documentation

## 2023-03-11 DIAGNOSIS — C9110 Chronic lymphocytic leukemia of B-cell type not having achieved remission: Secondary | ICD-10-CM | POA: Insufficient documentation

## 2023-03-11 DIAGNOSIS — Z85048 Personal history of other malignant neoplasm of rectum, rectosigmoid junction, and anus: Secondary | ICD-10-CM | POA: Insufficient documentation

## 2023-03-11 DIAGNOSIS — Z86718 Personal history of other venous thrombosis and embolism: Secondary | ICD-10-CM | POA: Diagnosis not present

## 2023-03-11 DIAGNOSIS — Z79899 Other long term (current) drug therapy: Secondary | ICD-10-CM | POA: Insufficient documentation

## 2023-03-11 DIAGNOSIS — Z923 Personal history of irradiation: Secondary | ICD-10-CM | POA: Insufficient documentation

## 2023-03-11 DIAGNOSIS — D649 Anemia, unspecified: Secondary | ICD-10-CM | POA: Diagnosis not present

## 2023-03-11 DIAGNOSIS — Z7982 Long term (current) use of aspirin: Secondary | ICD-10-CM | POA: Insufficient documentation

## 2023-03-11 LAB — CBC WITH DIFFERENTIAL (CANCER CENTER ONLY)
Abs Immature Granulocytes: 0.04 10*3/uL (ref 0.00–0.07)
Basophils Absolute: 0.2 10*3/uL — ABNORMAL HIGH (ref 0.0–0.1)
Basophils Relative: 1 %
Eosinophils Absolute: 0.4 10*3/uL (ref 0.0–0.5)
Eosinophils Relative: 1 %
HCT: 39 % (ref 39.0–52.0)
Hemoglobin: 12.8 g/dL — ABNORMAL LOW (ref 13.0–17.0)
Immature Granulocytes: 0 %
Lymphocytes Relative: 81 %
Lymphs Abs: 24.9 10*3/uL — ABNORMAL HIGH (ref 0.7–4.0)
MCH: 32.4 pg (ref 26.0–34.0)
MCHC: 32.8 g/dL (ref 30.0–36.0)
MCV: 98.7 fL (ref 80.0–100.0)
Monocytes Absolute: 2.5 10*3/uL — ABNORMAL HIGH (ref 0.1–1.0)
Monocytes Relative: 8 %
Neutro Abs: 2.7 10*3/uL (ref 1.7–7.7)
Neutrophils Relative %: 9 %
Platelet Count: 150 10*3/uL (ref 150–400)
RBC: 3.95 MIL/uL — ABNORMAL LOW (ref 4.22–5.81)
RDW: 14.3 % (ref 11.5–15.5)
Smear Review: NORMAL
WBC Count: 30.8 10*3/uL — ABNORMAL HIGH (ref 4.0–10.5)
nRBC: 0 % (ref 0.0–0.2)

## 2023-03-11 LAB — CMP (CANCER CENTER ONLY)
ALT: 9 U/L (ref 0–44)
AST: 18 U/L (ref 15–41)
Albumin: 4.2 g/dL (ref 3.5–5.0)
Alkaline Phosphatase: 48 U/L (ref 38–126)
Anion gap: 7 (ref 5–15)
BUN: 16 mg/dL (ref 8–23)
CO2: 28 mmol/L (ref 22–32)
Calcium: 8.8 mg/dL — ABNORMAL LOW (ref 8.9–10.3)
Chloride: 104 mmol/L (ref 98–111)
Creatinine: 1.01 mg/dL (ref 0.61–1.24)
GFR, Estimated: >60 mL/min (ref >60)
Glucose, Bld: 69 mg/dL — ABNORMAL LOW (ref 70–99)
Potassium: 4.5 mmol/L (ref 3.5–5.1)
Sodium: 139 mmol/L (ref 135–145)
Total Bilirubin: 1.6 mg/dL — ABNORMAL HIGH (ref 0.3–1.2)
Total Protein: 6.5 g/dL (ref 6.5–8.1)

## 2023-03-11 LAB — LACTATE DEHYDROGENASE: LDH: 440 U/L — ABNORMAL HIGH (ref 98–192)

## 2023-03-11 NOTE — Progress Notes (Signed)
[facility] [phone]   Fax:(336) [phone]  OFFICE PROGRESS NOTE  Ellyn Hack, MD [address] [place] [address]  DIAGNOSIS:  1) History of rectal cancer in 2007 status post concurrent chemoradiation followed by surgery. 2) Chronic lymphocytic leukemia diagnosed in 2007.  PRIOR THERAPY: None  CURRENT THERAPY: Ibrutinib 420 mg p.o. daily.  INTERVAL HISTORY: Aaron Gonzalez 77 y.o. male returns to the clinic today for follow-up visit.  The patient is feeling fine today with no concerning complaints.  He has no chest pain, shortness of breath, cough or hemoptysis.  He has no nausea, vomiting, diarrhea or constipation.  He has no palpable lymphadenopathy.  He has no weight loss or night sweats.  He has been tolerating his treatment with ibrutinib fairly well.  He is here today for evaluation and repeat blood work.  MEDICAL HISTORY: Past Medical History:  Diagnosis Date   Arthritis    CLL (chronic lymphocytic leukemia) (HCC)    DVT (deep venous thrombosis) (HCC)    History of rectal cancer    Hypertension    Stroke (HCC)     ALLERGIES:  has No Known Allergies.  MEDICATIONS:  Current Outpatient Medications  Medication Sig Dispense Refill   amLODipine (NORVASC) 10 MG tablet Take 1 tablet by mouth daily.     aspirin 81 MG chewable tablet Chew 1 tablet (81 mg total) by mouth 2 (two) times daily. (Patient not taking: Reported on 10/16/2022) 30 tablet 0   blood glucose meter kit and supplies KIT Dispense based on patient and insurance preference. Use up to four times daily as directed. 1 each 0   carvedilol (COREG) 6.25 MG tablet TAKE 1 TABLET(6.25 MG) BY MOUTH TWICE DAILY 180 tablet 3   cyclobenzaprine (FLEXERIL) 10 MG tablet Take 1 tablet (10 mg total) by mouth 3 (three) times daily as needed for muscle spasms. 30 tablet 0   cyclobenzaprine (FLEXERIL) 10 MG tablet Take 1 tablet (10 mg total) by mouth 3 (three) times daily as needed for  muscle spasms. 30 tablet 2   hydrochlorothiazide (HYDRODIURIL) 25 MG tablet Take 1 tablet (25 mg total) by mouth daily. 90 tablet 3   IMBRUVICA 420 MG tablet TAKE 1 TABLET BY MOUTH ONCE  DAILY WITH A FULL GLASS OF WATER 28 tablet 3   meloxicam (MOBIC) 15 MG tablet TAKE 1 TABLET(15 MG) BY MOUTH DAILY 30 tablet 0   meloxicam (MOBIC) 15 MG tablet Take 1 tablet (15 mg total) by mouth daily. 30 tablet 2   methylPREDNISolone (MEDROL DOSEPAK) 4 MG TBPK tablet Take as directed for 6 days 1 each 0   triamcinolone cream (KENALOG) 0.1 % Apply 1 Application topically 2 (two) times daily. 30 g 0   No current facility-administered medications for this visit.    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   PHYSICAL EXAMINATION: General appearance: alert, cooperative, and no distress Head: Normocephalic, without obvious abnormality, atraumatic Neck: no adenopathy, no JVD, supple, symmetrical, trachea midline, and thyroid not enlarged, symmetric, no tenderness/mass/nodules Lymph nodes: Cervical, supraclavicular, and axillary nodes normal. Resp: clear to auscultation bilaterally Back:  symmetric, no curvature. ROM normal. No CVA tenderness. Cardio: regular rate and rhythm, S1, S2 normal, no murmur, click, rub or gallop GI: soft, non-tender; bowel sounds normal; no masses,  no organomegaly Extremities: extremities normal, atraumatic, no cyanosis or edema  ECOG PERFORMANCE STATUS: 1 - Symptomatic but completely ambulatory  Blood pressure (!) 145/95, pulse 70, temperature (!) 97.3 F (36.3 C), temperature source Temporal, resp. rate 16, weight 159 lb (72.1 kg), SpO2 96  %.  LABORATORY DATA: Lab Results  Component Value Date   WBC 30.8 (H) 03/11/2023   HGB 12.8 (L) 03/11/2023   HCT 39.0 03/11/2023   MCV 98.7 03/11/2023   PLT 150 03/11/2023      Chemistry      Component Value Date/Time   NA 141 11/28/2022 1254   NA 139 06/28/2021 0954   K 3.9 11/28/2022 1254   CL 106 11/28/2022 1254   CO2 29 11/28/2022 1254   BUN 15 11/28/2022 1254   BUN 11 06/28/2021 0954   CREATININE 0.95 11/28/2022 1254      Component Value Date/Time   CALCIUM 9.2 11/28/2022 1254   ALKPHOS 37 (L) 11/28/2022 1254   AST 20 11/28/2022 1254   ALT 9 11/28/2022 1254   BILITOT 3.2 (H) 11/28/2022 1254       RADIOGRAPHIC STUDIES: No results found.  ASSESSMENT AND PLAN: This is a very pleasant 77 years old African-American male with history of rectal cancer diagnosed in 2007 status post concurrent chemoradiation followed by surgical resection.  At the same time the patient was diagnosed with chronic lymphocytic leukemia and has been in observation initially for several years but started on treatment with ibrutinib 420 mg in 2019. The patient has been tolerating his treatment with ibrutinib fairly well with no concerning adverse effects. Repeat CBC today showed elevated white blood count up to 30.8 with mild anemia with hemoglobin 12.8. He has been on treatment with Medrol Dosepak for arthritis recently.  This could be the underlying cause for his elevated white blood count on the recent blood work. I recommended for the patient to have repeat CBC and LDH in 2 weeks for reevaluation of his blood count before consideration of changing his treatment. He was advised to call immediately if he has any other concerning symptoms in the interval. The patient voices understanding of current disease status and treatment options and is in agreement with the current care plan. All questions were answered. The patient knows to call the clinic with any problems, questions or concerns. We can  certainly see the patient much sooner if necessary.  Disclaimer: This note was dictated with voice recognition software. Similar sounding words can inadvertently be transcribed and may not be corrected upon review.

## 2023-03-11 NOTE — Addendum Note (Signed)
Addended by: Charma Igo on: 03/11/2023 12:13 PM   Modules accepted: Orders

## 2023-03-27 ENCOUNTER — Inpatient Hospital Stay: Payer: Medicare HMO | Attending: Internal Medicine

## 2023-03-27 ENCOUNTER — Inpatient Hospital Stay: Payer: Medicare HMO | Admitting: Internal Medicine

## 2023-03-27 ENCOUNTER — Other Ambulatory Visit: Payer: Self-pay

## 2023-03-27 VITALS — BP 155/90 | HR 79 | Temp 97.9°F | Resp 17 | Wt 162.8 lb

## 2023-03-27 DIAGNOSIS — Z86718 Personal history of other venous thrombosis and embolism: Secondary | ICD-10-CM | POA: Diagnosis not present

## 2023-03-27 DIAGNOSIS — Z923 Personal history of irradiation: Secondary | ICD-10-CM | POA: Diagnosis not present

## 2023-03-27 DIAGNOSIS — C9110 Chronic lymphocytic leukemia of B-cell type not having achieved remission: Secondary | ICD-10-CM | POA: Diagnosis present

## 2023-03-27 DIAGNOSIS — Z79899 Other long term (current) drug therapy: Secondary | ICD-10-CM | POA: Diagnosis not present

## 2023-03-27 DIAGNOSIS — Z85048 Personal history of other malignant neoplasm of rectum, rectosigmoid junction, and anus: Secondary | ICD-10-CM | POA: Diagnosis present

## 2023-03-27 LAB — CBC WITH DIFFERENTIAL (CANCER CENTER ONLY)
Abs Immature Granulocytes: 0.03 10*3/uL (ref 0.00–0.07)
Basophils Absolute: 0.2 10*3/uL — ABNORMAL HIGH (ref 0.0–0.1)
Basophils Relative: 1 %
Eosinophils Absolute: 0.3 10*3/uL (ref 0.0–0.5)
Eosinophils Relative: 1 %
HCT: 36.7 % — ABNORMAL LOW (ref 39.0–52.0)
Hemoglobin: 12.4 g/dL — ABNORMAL LOW (ref 13.0–17.0)
Immature Granulocytes: 0 %
Lymphocytes Relative: 81 %
Lymphs Abs: 21.3 10*3/uL — ABNORMAL HIGH (ref 0.7–4.0)
MCH: 32.5 pg (ref 26.0–34.0)
MCHC: 33.8 g/dL (ref 30.0–36.0)
MCV: 96.3 fL (ref 80.0–100.0)
Monocytes Absolute: 1.1 10*3/uL — ABNORMAL HIGH (ref 0.1–1.0)
Monocytes Relative: 4 %
Neutro Abs: 3.4 10*3/uL (ref 1.7–7.7)
Neutrophils Relative %: 13 %
Platelet Count: 238 10*3/uL (ref 150–400)
RBC: 3.81 MIL/uL — ABNORMAL LOW (ref 4.22–5.81)
RDW: 14.3 % (ref 11.5–15.5)
Smear Review: NORMAL
WBC Count: 26.3 10*3/uL — ABNORMAL HIGH (ref 4.0–10.5)
nRBC: 0 % (ref 0.0–0.2)

## 2023-03-27 LAB — LACTATE DEHYDROGENASE: LDH: 204 U/L — ABNORMAL HIGH (ref 98–192)

## 2023-03-27 NOTE — Progress Notes (Signed)
[facility] Telephone:([phone]) [phone]   Fax:(336) [phone]  OFFICE PROGRESS NOTE  Ellyn Hack, MD [address] [place] [address]  DIAGNOSIS:  1) History of rectal cancer in 2007 status post concurrent chemoradiation followed by surgery. 2) Chronic lymphocytic leukemia diagnosed in 2007.  PRIOR THERAPY: None  CURRENT THERAPY: Ibrutinib 420 mg p.o. daily.  INTERVAL HISTORY: Aaron Gonzalez 77 y.o. male to the clinic today for follow-up visit.  The patient is feeling fine today with no concerning complaints.  He denied having any chest pain, shortness of breath, cough or hemoptysis.  He has no palpable lymphadenopathy.  He has no bleeding, bruises or ecchymosis.  He has no nausea, vomiting, diarrhea or constipation.  He has no headache or visual changes.  He is here today for evaluation and repeat blood work.   MEDICAL HISTORY: Past Medical History:  Diagnosis Date   Arthritis    CLL (chronic lymphocytic leukemia) (HCC)    DVT (deep venous thrombosis) (HCC)    History of rectal cancer    Hypertension    Stroke (HCC)     ALLERGIES:  has No Known Allergies.  MEDICATIONS:  Current Outpatient Medications  Medication Sig Dispense Refill   amLODipine (NORVASC) 10 MG tablet Take 1 tablet by mouth daily.     blood glucose meter kit and supplies KIT Dispense based on patient and insurance preference. Use up to four times daily as directed. 1 each 0   carvedilol (COREG) 6.25 MG tablet TAKE 1 TABLET(6.25 MG) BY MOUTH TWICE DAILY 180 tablet 3   cyclobenzaprine (FLEXERIL) 10 MG tablet Take 1 tablet (10 mg total) by mouth 3 (three) times daily as needed for muscle spasms. 30 tablet 2   fluticasone (FLONASE) 50 MCG/ACT nasal spray Place 1 spray into both nostrils daily.     IMBRUVICA 420 MG tablet TAKE 1 TABLET BY MOUTH ONCE  DAILY WITH A FULL GLASS OF WATER 28 tablet 3   meloxicam (MOBIC) 15 MG tablet Take 1 tablet (15 mg total) by mouth daily. 30 tablet 2    methocarbamol (ROBAXIN) 500 MG tablet Take 500 mg by mouth 2 (two) times daily as needed for muscle spasms.     No current facility-administered medications for this visit.    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   PHYSICAL EXAMINATION: General appearance: alert, cooperative, and no distress Head: Normocephalic, without obvious abnormality, atraumatic Neck: no adenopathy, no JVD, supple, symmetrical, trachea midline, and thyroid not enlarged, symmetric, no tenderness/mass/nodules Lymph nodes: Cervical, supraclavicular, and axillary nodes normal. Resp: clear to auscultation bilaterally Back: symmetric, no curvature. ROM normal. No CVA tenderness. Cardio: regular rate and rhythm, S1, S2 normal, no murmur, click, rub or gallop GI: soft, non-tender; bowel sounds normal; no masses,  no organomegaly Extremities: extremities normal, atraumatic, no cyanosis or edema  ECOG PERFORMANCE STATUS: 1 - Symptomatic but completely ambulatory  Blood pressure (!) 155/90, pulse 79, temperature 97.9 F (36.6 C), temperature source Oral, resp. rate 17, weight 162 lb 12.8 oz (73.8 kg), SpO2 98 %.  LABORATORY DATA: Lab Results  Component Value Date  WBC 26.3 (H) 03/27/2023   HGB 12.4 (L) 03/27/2023   HCT 36.7 (L) 03/27/2023   MCV 96.3 03/27/2023   PLT 238 03/27/2023      Chemistry      Component Value Date/Time   NA 139 03/11/2023 0935   NA 139 06/28/2021 0954   K 4.5 03/11/2023 0935   CL 104 03/11/2023 0935   CO2 28 03/11/2023 0935   BUN 16 03/11/2023 0935   BUN 11 06/28/2021 0954   CREATININE 1.01 03/11/2023 0935      Component Value Date/Time    CALCIUM 8.8 (L) 03/11/2023 0935   ALKPHOS 48 03/11/2023 0935   AST 18 03/11/2023 0935   ALT 9 03/11/2023 0935   BILITOT 1.6 (H) 03/11/2023 0935       RADIOGRAPHIC STUDIES: No results found.  ASSESSMENT AND PLAN: This is a very pleasant 77 years old African-American male with history of rectal cancer diagnosed in 2007 status post concurrent chemoradiation followed by surgical resection.  At the same time the patient was diagnosed with chronic lymphocytic leukemia and has been in observation initially for several years but started on treatment with ibrutinib 420 mg in 2019. The patient continues to tolerate his treatment with ibrutinib fairly well. Repeat CBC today showed decrease in the total white blood count after he discontinued his treatment with the steroid. He continues to have mild anemia. I recommended for him to continue his current treatment with ibrutinib with the same dose. I will see him back for follow-up visit in 2 months for evaluation with repeat blood work. He was advised to call immediately if he has any concerning symptoms in the interval. The patient voices understanding of current disease status and treatment options and is in agreement with the current care plan. All questions were answered. The patient knows to call the clinic with any problems, questions or concerns. We can certainly see the patient much sooner if necessary.  Disclaimer: This note was dictated with voice recognition software. Similar sounding words can inadvertently be transcribed and may not be corrected upon review.

## 2023-05-04 ENCOUNTER — Other Ambulatory Visit: Payer: Self-pay | Admitting: Podiatry

## 2023-05-27 ENCOUNTER — Telehealth: Payer: Self-pay | Admitting: Internal Medicine

## 2023-05-27 ENCOUNTER — Other Ambulatory Visit: Payer: Self-pay

## 2023-05-27 ENCOUNTER — Inpatient Hospital Stay: Payer: Medicare HMO | Attending: Internal Medicine

## 2023-05-27 ENCOUNTER — Inpatient Hospital Stay: Payer: Medicare HMO | Admitting: Internal Medicine

## 2023-05-27 VITALS — BP 171/111 | HR 75 | Temp 98.6°F | Resp 18 | Wt 159.5 lb

## 2023-05-27 DIAGNOSIS — Z86718 Personal history of other venous thrombosis and embolism: Secondary | ICD-10-CM | POA: Insufficient documentation

## 2023-05-27 DIAGNOSIS — Z8673 Personal history of transient ischemic attack (TIA), and cerebral infarction without residual deficits: Secondary | ICD-10-CM | POA: Insufficient documentation

## 2023-05-27 DIAGNOSIS — Z79899 Other long term (current) drug therapy: Secondary | ICD-10-CM | POA: Diagnosis not present

## 2023-05-27 DIAGNOSIS — Z85048 Personal history of other malignant neoplasm of rectum, rectosigmoid junction, and anus: Secondary | ICD-10-CM | POA: Diagnosis present

## 2023-05-27 DIAGNOSIS — Z9049 Acquired absence of other specified parts of digestive tract: Secondary | ICD-10-CM | POA: Insufficient documentation

## 2023-05-27 DIAGNOSIS — Z923 Personal history of irradiation: Secondary | ICD-10-CM | POA: Diagnosis not present

## 2023-05-27 DIAGNOSIS — C9110 Chronic lymphocytic leukemia of B-cell type not having achieved remission: Secondary | ICD-10-CM

## 2023-05-27 LAB — CBC WITH DIFFERENTIAL (CANCER CENTER ONLY)
Abs Immature Granulocytes: 0.08 10*3/uL — ABNORMAL HIGH (ref 0.00–0.07)
Basophils Absolute: 0.2 10*3/uL — ABNORMAL HIGH (ref 0.0–0.1)
Basophils Relative: 0 %
Eosinophils Absolute: 0.5 10*3/uL (ref 0.0–0.5)
Eosinophils Relative: 1 %
HCT: 34.3 % — ABNORMAL LOW (ref 39.0–52.0)
Hemoglobin: 11.1 g/dL — ABNORMAL LOW (ref 13.0–17.0)
Immature Granulocytes: 0 %
Lymphocytes Relative: 88 %
Lymphs Abs: 35.1 10*3/uL — ABNORMAL HIGH (ref 0.7–4.0)
MCH: 30.9 pg (ref 26.0–34.0)
MCHC: 32.4 g/dL (ref 30.0–36.0)
MCV: 95.5 fL (ref 80.0–100.0)
Monocytes Absolute: 2.3 10*3/uL — ABNORMAL HIGH (ref 0.1–1.0)
Monocytes Relative: 6 %
Neutro Abs: 2 10*3/uL (ref 1.7–7.7)
Neutrophils Relative %: 5 %
Platelet Count: 151 10*3/uL (ref 150–400)
RBC: 3.59 MIL/uL — ABNORMAL LOW (ref 4.22–5.81)
RDW: 19 % — ABNORMAL HIGH (ref 11.5–15.5)
Smear Review: NORMAL
WBC Count: 40.1 10*3/uL — ABNORMAL HIGH (ref 4.0–10.5)
nRBC: 0 % (ref 0.0–0.2)

## 2023-05-27 LAB — CMP (CANCER CENTER ONLY)
ALT: 12 U/L (ref 0–44)
AST: 21 U/L (ref 15–41)
Albumin: 4.1 g/dL (ref 3.5–5.0)
Alkaline Phosphatase: 53 U/L (ref 38–126)
Anion gap: 6 (ref 5–15)
BUN: 14 mg/dL (ref 8–23)
CO2: 27 mmol/L (ref 22–32)
Calcium: 8.6 mg/dL — ABNORMAL LOW (ref 8.9–10.3)
Chloride: 104 mmol/L (ref 98–111)
Creatinine: 0.94 mg/dL (ref 0.61–1.24)
GFR, Estimated: >60 mL/min (ref >60)
Glucose, Bld: 77 mg/dL (ref 70–99)
Potassium: 3.8 mmol/L (ref 3.5–5.1)
Sodium: 137 mmol/L (ref 135–145)
Total Bilirubin: 1.6 mg/dL — ABNORMAL HIGH (ref 0.3–1.2)
Total Protein: 6.3 g/dL — ABNORMAL LOW (ref 6.5–8.1)

## 2023-05-27 LAB — LACTATE DEHYDROGENASE: LDH: 531 U/L — ABNORMAL HIGH (ref 98–192)

## 2023-05-27 NOTE — Progress Notes (Signed)
[facility] [phone]   Fax:([phone]  OFFICE PROGRESS NOTE  Ellyn Hack, MD [address] [place] [address]  DIAGNOSIS:  1) History of rectal cancer in 2007 status post concurrent chemoradiation followed by surgery. 2) Chronic lymphocytic leukemia diagnosed in 2007.  PRIOR THERAPY: None  CURRENT THERAPY: Ibrutinib 420 mg p.o. daily.  INTERVAL HISTORY: Aaron Gonzalez 77 y.o. male returns to the clinic today for follow-up visit.  The patient is feeling fine with no concerning complaints.  He is currently staying at an independent living facility.  He denied having any current chest pain, shortness of breath, cough or hemoptysis.  He has no recent weight loss or night sweats.  He has no nausea, vomiting, diarrhea or constipation.  He continues to tolerate his treatment with ibrutinib fairly well.  He is here for evaluation and repeat blood work.   MEDICAL HISTORY: Past Medical History:  Diagnosis Date   Arthritis    CLL (chronic lymphocytic leukemia) (HCC)    DVT (deep venous thrombosis) (HCC)    History of rectal cancer    Hypertension    Stroke (HCC)     ALLERGIES:  has No Known Allergies.  MEDICATIONS:  Current Outpatient Medications  Medication Sig Dispense Refill   amLODipine (NORVASC) 10 MG tablet Take 1 tablet by mouth daily.     blood glucose meter kit and supplies KIT Dispense based on patient and insurance preference. Use up to four times daily as directed. 1 each 0   carvedilol (COREG) 6.25 MG tablet TAKE 1 TABLET(6.25 MG) BY MOUTH TWICE DAILY 180 tablet 3   cyclobenzaprine (FLEXERIL) 10 MG tablet Take 1 tablet (10 mg total) by mouth 3 (three) times daily as needed for muscle spasms. 30 tablet 2   fluticasone (FLONASE) 50 MCG/ACT nasal spray Place 1 spray into both nostrils daily.     IMBRUVICA 420 MG tablet TAKE 1 TABLET BY MOUTH ONCE  DAILY WITH A FULL GLASS OF WATER 28 tablet 3   meloxicam (MOBIC) 15 MG tablet  TAKE 1 TABLET(15 MG) BY MOUTH DAILY 30 tablet 2   methocarbamol (ROBAXIN) 500 MG tablet Take 500 mg by mouth 2 (two) times daily as needed for muscle spasms.     No current facility-administered medications for this visit.    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   PHYSICAL EXAMINATION: General appearance: alert, cooperative, and no distress Head: Normocephalic, without obvious abnormality, atraumatic Neck: no adenopathy, no JVD, supple, symmetrical, trachea midline, and thyroid not enlarged, symmetric, no tenderness/mass/nodules Lymph nodes: Cervical, supraclavicular, and axillary nodes normal. Resp: clear to auscultation bilaterally Back: symmetric, no curvature. ROM normal. No CVA tenderness. Cardio: regular rate and rhythm, S1, S2 normal, no murmur, click, rub or gallop GI: soft, non-tender; bowel sounds normal; no masses,  no organomegaly Extremities: extremities normal, atraumatic, no cyanosis or edema  ECOG PERFORMANCE STATUS: 1 - Symptomatic but completely ambulatory  Blood pressure (!) 171/111, pulse 75, temperature 98.6 F (37 C), temperature source Oral, resp. rate 18, weight 159 lb 8 oz (72.3 kg), SpO2 100%.  LABORATORY DATA: Lab Results  Component Value Date   WBC 26.3 (H) 03/27/2023   HGB 12.4 (L) 03/27/2023   HCT 36.7 (L) 03/27/2023   MCV 96.3 03/27/2023   PLT 238 03/27/2023      Chemistry      Component Value Date/Time   NA 139 03/11/2023 0935   NA 139 06/28/2021 0954   K 4.5 03/11/2023 0935   CL 104 03/11/2023 0935   CO2 28 03/11/2023 0935   BUN 16 03/11/2023 0935   BUN 11 06/28/2021 0954   CREATININE 1.01 03/11/2023  0935      Component Value Date/Time   CALCIUM 8.8 (L) 03/11/2023 0935   ALKPHOS 48 03/11/2023 0935   AST 18 03/11/2023 0935   ALT 9 03/11/2023 0935   BILITOT 1.6 (H) 03/11/2023 0935       RADIOGRAPHIC STUDIES: No results found.  ASSESSMENT AND PLAN: This is a very pleasant 77 years old African-American male with history of rectal cancer diagnosed in 2007 status post concurrent chemoradiation followed by surgical resection.  At the same time the patient was diagnosed with chronic lymphocytic leukemia and has been in observation initially for several years but started on treatment with ibrutinib 420 mg in 2019. The patient continues to tolerate his treatment with ibrutinib fairly well. Repeat CBC today showed further increase in his total white blood count concerning for disease progression but other lab results including comprehensive metabolic panel and LDH are still pending. I recommended for the patient to continue his current treatment with ibrutinib for now but I will see him sooner in 1 months for repeat blood work and if he continues to have increase in his blood count, we may consider switching him to another BTK inhibitor. The patient was advised to take his blood pressure medication as prescribed and to monitor it closely at home. He was also advised to call immediately if he has any other concerning symptoms in the interval. The patient voices understanding of current disease status and treatment options and is in agreement with the current care plan. All questions were answered. The patient knows to call the clinic with any problems, questions or concerns. We can certainly see the patient much sooner if necessary.  Disclaimer: This note was dictated with voice recognition software. Similar sounding words can inadvertently be transcribed and may not be corrected upon review.

## 2023-05-27 NOTE — Telephone Encounter (Signed)
Scheduled per 08/06 los, patient checked out in person and received updated calender.

## 2023-05-30 ENCOUNTER — Telehealth: Payer: Self-pay | Admitting: Internal Medicine

## 2023-05-30 NOTE — Telephone Encounter (Signed)
Scheduled per 08/06 los, patient has been called and voicemail was left.

## 2023-06-10 ENCOUNTER — Ambulatory Visit: Payer: Medicare HMO | Admitting: Podiatry

## 2023-06-10 DIAGNOSIS — B351 Tinea unguium: Secondary | ICD-10-CM | POA: Diagnosis not present

## 2023-06-10 DIAGNOSIS — M79674 Pain in right toe(s): Secondary | ICD-10-CM | POA: Diagnosis not present

## 2023-06-10 DIAGNOSIS — M79675 Pain in left toe(s): Secondary | ICD-10-CM | POA: Diagnosis not present

## 2023-06-10 NOTE — Progress Notes (Signed)
    Subjective:  Patient ID: Aaron Gonzalez, male    DOB: Feb 01, 1946,  MRN: [id number]  Aaron Gonzalez presents to clinic today for: No chief complaint on file.  Patient notes nails are thick, discolored, elongated and painful in shoegear when trying to ambulate.    PCP is Ellyn Hack, MD.  Past Medical History:  Diagnosis Date   Arthritis    CLL (chronic lymphocytic leukemia) (HCC)    DVT (deep venous thrombosis) (HCC)    History of rectal cancer    Hypertension    Stroke (HCC)    No Known Allergies  Review of Systems: Negative except as noted in the HPI.  Objective:  [name] is a pleasant 77 y.o. male in NAD. AAO x 3.  Vascular Examination: Capillary refill time is 3-5 seconds to toes bilateral. Palpable pedal pulses b/l LE. Digital hair present b/l.  Skin temperature gradient WNL b/l. No varicosities b/l. No cyanosis noted b/l.   Dermatological Examination: Pedal skin with normal turgor, texture and tone b/l. No open wounds. No interdigital macerations b/l. Toenails x10 are 3mm thick, discolored, dystrophic with subungual debris. There is pain with compression of the nail plates.  They are elongated x10  Assessment/Plan: 1. Pain due to onychomycosis of toenails of both feet     The mycotic toenails were sharply debrided x10 with sterile nail nippers and a power debriding burr to decrease bulk/thickness and length.    Patient is already scheduled for his 34-month follow-up with Dr. Donzetta Matters for nail care  Greyden Besecker DBurna Mortimer, DPM, FACFAS [facility]     [address] [place], [place] [address]                Office [phone]  Fax [phone]

## 2023-06-26 ENCOUNTER — Inpatient Hospital Stay: Payer: Medicare HMO | Attending: Internal Medicine

## 2023-06-26 ENCOUNTER — Inpatient Hospital Stay: Payer: Medicare HMO | Admitting: Internal Medicine

## 2023-06-26 VITALS — BP 146/78 | HR 64 | Temp 98.4°F | Resp 16 | Ht 65.0 in | Wt 163.0 lb

## 2023-06-26 DIAGNOSIS — Z9221 Personal history of antineoplastic chemotherapy: Secondary | ICD-10-CM | POA: Diagnosis not present

## 2023-06-26 DIAGNOSIS — Z923 Personal history of irradiation: Secondary | ICD-10-CM | POA: Insufficient documentation

## 2023-06-26 DIAGNOSIS — Z85048 Personal history of other malignant neoplasm of rectum, rectosigmoid junction, and anus: Secondary | ICD-10-CM | POA: Insufficient documentation

## 2023-06-26 DIAGNOSIS — Z79899 Other long term (current) drug therapy: Secondary | ICD-10-CM | POA: Insufficient documentation

## 2023-06-26 DIAGNOSIS — C9110 Chronic lymphocytic leukemia of B-cell type not having achieved remission: Secondary | ICD-10-CM | POA: Diagnosis present

## 2023-06-26 DIAGNOSIS — Z86718 Personal history of other venous thrombosis and embolism: Secondary | ICD-10-CM | POA: Insufficient documentation

## 2023-06-26 LAB — CBC WITH DIFFERENTIAL (CANCER CENTER ONLY)
Abs Immature Granulocytes: 0.04 10*3/uL (ref 0.00–0.07)
Basophils Absolute: 0.1 10*3/uL (ref 0.0–0.1)
Basophils Relative: 1 %
Eosinophils Absolute: 0.3 10*3/uL (ref 0.0–0.5)
Eosinophils Relative: 1 %
HCT: 34.9 % — ABNORMAL LOW (ref 39.0–52.0)
Hemoglobin: 11.7 g/dL — ABNORMAL LOW (ref 13.0–17.0)
Immature Granulocytes: 0 %
Lymphocytes Relative: 80 %
Lymphs Abs: 22.4 10*3/uL — ABNORMAL HIGH (ref 0.7–4.0)
MCH: 31.7 pg (ref 26.0–34.0)
MCHC: 33.5 g/dL (ref 30.0–36.0)
MCV: 94.6 fL (ref 80.0–100.0)
Monocytes Absolute: 1.4 10*3/uL — ABNORMAL HIGH (ref 0.1–1.0)
Monocytes Relative: 5 %
Neutro Abs: 3.7 10*3/uL (ref 1.7–7.7)
Neutrophils Relative %: 13 %
Platelet Count: 199 10*3/uL (ref 150–400)
RBC: 3.69 MIL/uL — ABNORMAL LOW (ref 4.22–5.81)
RDW: 17.1 % — ABNORMAL HIGH (ref 11.5–15.5)
Smear Review: NORMAL
WBC Count: 28 10*3/uL — ABNORMAL HIGH (ref 4.0–10.5)
nRBC: 0 % (ref 0.0–0.2)

## 2023-06-26 LAB — CMP (CANCER CENTER ONLY)
ALT: 10 U/L (ref 0–44)
AST: 17 U/L (ref 15–41)
Albumin: 4.1 g/dL (ref 3.5–5.0)
Alkaline Phosphatase: 45 U/L (ref 38–126)
Anion gap: 5 (ref 5–15)
BUN: 14 mg/dL (ref 8–23)
CO2: 29 mmol/L (ref 22–32)
Calcium: 9 mg/dL (ref 8.9–10.3)
Chloride: 105 mmol/L (ref 98–111)
Creatinine: 0.92 mg/dL (ref 0.61–1.24)
GFR, Estimated: >60 mL/min (ref >60)
Glucose, Bld: 83 mg/dL (ref 70–99)
Potassium: 4.2 mmol/L (ref 3.5–5.1)
Sodium: 139 mmol/L (ref 135–145)
Total Bilirubin: 1.3 mg/dL — ABNORMAL HIGH (ref 0.3–1.2)
Total Protein: 6.3 g/dL — ABNORMAL LOW (ref 6.5–8.1)

## 2023-06-26 LAB — LACTATE DEHYDROGENASE: LDH: 165 U/L (ref 98–192)

## 2023-06-26 LAB — URIC ACID: Uric Acid, Serum: 5.1 mg/dL (ref 3.7–8.6)

## 2023-06-26 NOTE — Progress Notes (Signed)
[facility] Telephone:([phone]   [phone]  OFFICE PROGRESS NOTE  Ellyn Hack, MD [address] [place] [address]  DIAGNOSIS:  1) History of rectal cancer in 2007 status post concurrent chemoradiation followed by surgery. 2) Chronic lymphocytic leukemia diagnosed in 2007.  PRIOR THERAPY: None  CURRENT THERAPY: Ibrutinib 420 mg p.o. daily.  INTERVAL HISTORY: Aaron Gonzalez 77 y.o. male returns to the clinic today for follow-up visit.  The patient is feeling fine today with no concerning complaints.  He denied having any recent weight loss or night sweats.  He has no nausea, vomiting, diarrhea or constipation.  He has no palpable lymphadenopathy.  He denied having any fever or chills.  He has no chest pain, shortness of breath, cough or hemoptysis.  He continues to tolerate his treatment with ibrutinib fairly well.  He is here today for evaluation and repeat blood work.  MEDICAL HISTORY: Past Medical History:  Diagnosis Date   Arthritis    CLL (chronic lymphocytic leukemia) (HCC)    DVT (deep venous thrombosis) (HCC)    History of rectal cancer    Hypertension    Stroke (HCC)     ALLERGIES:  has No Known Allergies.  MEDICATIONS:  Current Outpatient Medications  Medication Sig Dispense Refill   amLODipine (NORVASC) 10 MG tablet Take 1 tablet by mouth daily.     blood glucose meter kit and supplies KIT Dispense based on patient and insurance preference. Use up to four times daily as directed. 1 each 0   carvedilol (COREG) 6.25 MG tablet TAKE 1 TABLET(6.25 MG) BY MOUTH TWICE DAILY 180 tablet 3   cyclobenzaprine (FLEXERIL) 10 MG tablet Take 1 tablet (10 mg total) by mouth 3 (three) times daily as needed for muscle spasms. 30 tablet 2   IMBRUVICA 420 MG tablet TAKE 1 TABLET BY MOUTH ONCE  DAILY WITH A FULL GLASS OF WATER 28 tablet 3   meloxicam (MOBIC) 15 MG tablet TAKE 1 TABLET(15 MG) BY MOUTH DAILY 30 tablet 2   No current  facility-administered medications for this visit.    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   PHYSICAL EXAMINATION: General appearance: alert, cooperative, and no distress Head: Normocephalic, without obvious abnormality, atraumatic Neck: no adenopathy, no JVD, supple, symmetrical, trachea midline, and thyroid not enlarged, symmetric, no tenderness/mass/nodules Lymph nodes: Cervical, supraclavicular, and axillary nodes normal. Resp: clear to auscultation bilaterally Back: symmetric, no curvature. ROM normal. No CVA tenderness. Cardio: regular rate and rhythm, S1, S2 normal, no murmur, click, rub or gallop GI: soft, non-tender; bowel sounds normal; no masses,  no organomegaly Extremities: extremities normal, atraumatic, no cyanosis or edema  ECOG PERFORMANCE STATUS: 1 - Symptomatic but completely ambulatory  Blood pressure (!) 146/78, pulse 64, temperature 98.4 F (36.9 C), temperature source Oral, resp. rate 16, height 5\' 5"  (1.651 m), weight 163 lb (73.9 kg), SpO2 97%.  LABORATORY DATA: Lab Results  Component Value Date   WBC 28.0 (H) 06/26/2023   HGB 11.7 (L) 06/26/2023   HCT 34.9 (L) 06/26/2023   MCV 94.6 06/26/2023   PLT 199 06/26/2023  Chemistry      Component Value Date/Time   NA 137 05/27/2023 0825   NA 139 06/28/2021 0954   K 3.8 05/27/2023 0825   CL 104 05/27/2023 0825   CO2 27 05/27/2023 0825   BUN 14 05/27/2023 0825   BUN 11 06/28/2021 0954   CREATININE 0.94 05/27/2023 0825      Component Value Date/Time   CALCIUM 8.6 (L) 05/27/2023 0825   ALKPHOS 53 05/27/2023 0825   AST 21 05/27/2023 0825   ALT 12 05/27/2023 0825    BILITOT 1.6 (H) 05/27/2023 0825       RADIOGRAPHIC STUDIES: No results found.  ASSESSMENT AND PLAN: This is a very pleasant 77 years old African-American male with history of rectal cancer diagnosed in 2007 status post concurrent chemoradiation followed by surgical resection.  At the same time the patient was diagnosed with chronic lymphocytic leukemia and has been in observation initially for several years but started on treatment with ibrutinib 420 mg in 2019. The patient has been tolerating his treatment with ibrutinib fairly well with no concerning adverse effects. Repeat CBC and comprehensive metabolic panel today showed stable disease with no concerning findings for progression in his total white blood count is down to 28,000. I recommended for the patient to continue his current treatment with ibrutinib with the same dose. I will see him back for follow-up visit in 2 months for evaluation and repeat blood work. He was advised to call immediately if he has any other concerning symptoms in the interval. The patient voices understanding of current disease status and treatment options and is in agreement with the current care plan. All questions were answered. The patient knows to call the clinic with any problems, questions or concerns. We can certainly see the patient much sooner if necessary.  Disclaimer: This note was dictated with voice recognition software. Similar sounding words can inadvertently be transcribed and may not be corrected upon review.

## 2023-07-03 ENCOUNTER — Other Ambulatory Visit: Payer: Self-pay | Admitting: Cardiology

## 2023-08-14 ENCOUNTER — Telehealth: Payer: Self-pay | Admitting: Pharmacy Technician

## 2023-08-14 ENCOUNTER — Other Ambulatory Visit (HOSPITAL_COMMUNITY): Payer: Self-pay

## 2023-08-14 ENCOUNTER — Other Ambulatory Visit: Payer: Self-pay | Admitting: Medical Oncology

## 2023-08-14 DIAGNOSIS — C9110 Chronic lymphocytic leukemia of B-cell type not having achieved remission: Secondary | ICD-10-CM

## 2023-08-14 MED ORDER — IBRUTINIB 420 MG PO TABS: 420.0000 mg | ORAL_TABLET | Freq: Every day | ORAL | 3 refills | Status: DC

## 2023-08-14 NOTE — Telephone Encounter (Signed)
Imbruvica refill requested.

## 2023-08-14 NOTE — Telephone Encounter (Signed)
Oral Oncology Patient Advocate Encounter   Received notification that prior authorization for Imbruvica is required.   PA submitted on 08/14/23 Key [id number] Status is pending     Jinger Neighbors, CPhT-Adv Oncology Pharmacy Patient Advocate [facility] Direct Number: [phone]  Fax: [phone]

## 2023-08-14 NOTE — Telephone Encounter (Signed)
Oral Oncology Patient Advocate Encounter  Prior Authorization for Aaron Gonzalez has been approved.    PA# [id number] Effective dates: 08/14/23 through 10/21/23  Patients co-pay is $0.    Jinger Neighbors, CPhT-Adv Oncology Pharmacy Patient Advocate [facility] Direct Number: [phone]  Fax: [phone]

## 2023-08-26 ENCOUNTER — Telehealth: Payer: Self-pay | Admitting: Pharmacist

## 2023-08-26 ENCOUNTER — Telehealth: Payer: Self-pay | Admitting: Pharmacy Technician

## 2023-08-26 ENCOUNTER — Inpatient Hospital Stay: Payer: Medicare HMO | Attending: Internal Medicine

## 2023-08-26 ENCOUNTER — Other Ambulatory Visit (HOSPITAL_COMMUNITY): Payer: Self-pay

## 2023-08-26 ENCOUNTER — Telehealth: Payer: Self-pay

## 2023-08-26 ENCOUNTER — Inpatient Hospital Stay: Payer: Medicare HMO | Admitting: Internal Medicine

## 2023-08-26 VITALS — BP 165/98 | HR 76 | Temp 98.3°F | Resp 15

## 2023-08-26 DIAGNOSIS — Z9049 Acquired absence of other specified parts of digestive tract: Secondary | ICD-10-CM | POA: Diagnosis not present

## 2023-08-26 DIAGNOSIS — M25471 Effusion, right ankle: Secondary | ICD-10-CM | POA: Diagnosis not present

## 2023-08-26 DIAGNOSIS — C9110 Chronic lymphocytic leukemia of B-cell type not having achieved remission: Secondary | ICD-10-CM | POA: Insufficient documentation

## 2023-08-26 DIAGNOSIS — Z85048 Personal history of other malignant neoplasm of rectum, rectosigmoid junction, and anus: Secondary | ICD-10-CM | POA: Insufficient documentation

## 2023-08-26 DIAGNOSIS — M25472 Effusion, left ankle: Secondary | ICD-10-CM | POA: Insufficient documentation

## 2023-08-26 DIAGNOSIS — Z923 Personal history of irradiation: Secondary | ICD-10-CM | POA: Insufficient documentation

## 2023-08-26 DIAGNOSIS — Z8673 Personal history of transient ischemic attack (TIA), and cerebral infarction without residual deficits: Secondary | ICD-10-CM | POA: Insufficient documentation

## 2023-08-26 DIAGNOSIS — Z79899 Other long term (current) drug therapy: Secondary | ICD-10-CM | POA: Diagnosis not present

## 2023-08-26 DIAGNOSIS — R61 Generalized hyperhidrosis: Secondary | ICD-10-CM | POA: Diagnosis not present

## 2023-08-26 DIAGNOSIS — Z86718 Personal history of other venous thrombosis and embolism: Secondary | ICD-10-CM | POA: Diagnosis not present

## 2023-08-26 DIAGNOSIS — R5383 Other fatigue: Secondary | ICD-10-CM | POA: Diagnosis not present

## 2023-08-26 DIAGNOSIS — I10 Essential (primary) hypertension: Secondary | ICD-10-CM | POA: Insufficient documentation

## 2023-08-26 LAB — CMP (CANCER CENTER ONLY)
ALT: 64 U/L — ABNORMAL HIGH (ref 0–44)
AST: 76 U/L — ABNORMAL HIGH (ref 15–41)
Albumin: 3.9 g/dL (ref 3.5–5.0)
Alkaline Phosphatase: 100 U/L (ref 38–126)
Anion gap: 11 (ref 5–15)
BUN: 22 mg/dL (ref 8–23)
CO2: 22 mmol/L (ref 22–32)
Calcium: 8.9 mg/dL (ref 8.9–10.3)
Chloride: 107 mmol/L (ref 98–111)
Creatinine: 0.7 mg/dL (ref 0.61–1.24)
GFR, Estimated: >60 mL/min (ref >60)
Glucose, Bld: 75 mg/dL (ref 70–99)
Potassium: 3.5 mmol/L (ref 3.5–5.1)
Sodium: 140 mmol/L (ref 135–145)
Total Bilirubin: 1.4 mg/dL — ABNORMAL HIGH (ref <1.2)
Total Protein: 9.6 g/dL — ABNORMAL HIGH (ref 6.5–8.1)

## 2023-08-26 LAB — CBC WITH DIFFERENTIAL (CANCER CENTER ONLY)
Abs Immature Granulocytes: 0 10*3/uL (ref 0.00–0.07)
Basophils Absolute: 0 10*3/uL (ref 0.0–0.1)
Basophils Relative: 0 %
Eosinophils Absolute: 0 10*3/uL (ref 0.0–0.5)
Eosinophils Relative: 0 %
HCT: 32.4 % — ABNORMAL LOW (ref 39.0–52.0)
Hemoglobin: 10 g/dL — ABNORMAL LOW (ref 13.0–17.0)
Lymphocytes Relative: 96 %
Lymphs Abs: 246.5 10*3/uL — ABNORMAL HIGH (ref 0.7–4.0)
MCH: 28.2 pg (ref 26.0–34.0)
MCHC: 30.9 g/dL (ref 30.0–36.0)
MCV: 91.3 fL (ref 80.0–100.0)
Monocytes Absolute: 2.6 10*3/uL — ABNORMAL HIGH (ref 0.1–1.0)
Monocytes Relative: 1 %
Neutro Abs: 7.7 10*3/uL (ref 1.7–7.7)
Neutrophils Relative %: 3 %
Platelet Count: 106 10*3/uL — ABNORMAL LOW (ref 150–400)
RBC: 3.55 MIL/uL — ABNORMAL LOW (ref 4.22–5.81)
RDW: 18.6 % — ABNORMAL HIGH (ref 11.5–15.5)
Smear Review: NORMAL
WBC Count: 256.8 10*3/uL (ref 4.0–10.5)
nRBC: 0.2 % (ref 0.0–0.2)

## 2023-08-26 LAB — LACTATE DEHYDROGENASE: LDH: 5205 U/L — ABNORMAL HIGH (ref 98–192)

## 2023-08-26 MED ORDER — ACALABRUTINIB MALEATE 100 MG PO TABS
100.0000 mg | ORAL_TABLET | Freq: Two times a day (BID) | ORAL | 2 refills | Status: DC
  Filled 2023-08-28: qty 60, 30d supply, fill #0
  Filled 2023-10-09: qty 60, 30d supply, fill #1
  Filled 2023-11-17: qty 60, 30d supply, fill #2

## 2023-08-26 MED ORDER — ACALABRUTINIB 100 MG PO CAPS: 100.0000 mg | ORAL_CAPSULE | Freq: Two times a day (BID) | ORAL | 2 refills | Status: DC

## 2023-08-26 NOTE — Telephone Encounter (Signed)
Oral Oncology Patient Advocate Encounter  Prior Authorization for Calquence has been approved.    PA# [id number] Effective dates: 08/26/23 through 10/21/23  Patients co-pay is $0.    Jinger Neighbors, CPhT-Adv Oncology Pharmacy Patient Advocate [facility] Direct Number: [phone]  Fax: [phone]

## 2023-08-26 NOTE — Telephone Encounter (Signed)
Oral Oncology Pharmacist Encounter  Received new prescription for Calquence (acalabrutinib) for the treatment of CLL, planned duration until disease progression or unacceptable drug toxicity.  CBC w/ Diff and CMP from 08/26/23 assessed, noted patient with WBC of 256.8 K/uL (secondary to CLL), ptlc of 105 K/uL. Patient with AST of 76 K/uL and ALT of 64 U/L - t.bili 1.4 mg/dL - no hepatic dose adjustments required at this time. Patient with elevated LDH of 5205 U/L (previously 165 U/L) - will see if MD would like to add allopurinol for TLS PPX. Prescription dose and frequency assessed for appropriateness.  Current medication list in Epic reviewed, DDIs with Calquence identified: Category C drug-drug interaction between Calquence and Meloxicam - Calquence may increase antiplatelet effects of meloxicam - recommend monitoring patient for s/sx of bruising/bleeding. No changes in therapy required at this time.   Evaluated chart and no patient barriers to medication adherence noted.   Patient agreement for treatment documented in MD note on 08/26/23.  Prescription has been e-scribed to the [facility] for benefits analysis and approval.  Oral Oncology Clinic will continue to follow for insurance authorization, copayment issues, initial counseling and start date.  Lenord Carbo, PharmD, BCPS, [facility] Hematology/Oncology Clinical Pharmacist [facility] Oral Chemotherapy Navigation Clinics [phone] 08/26/2023 1:44 PM

## 2023-08-26 NOTE — Progress Notes (Signed)
[facility] Telephone:([phone]   Fax:([phone]  OFFICE PROGRESS NOTE  Ellyn Hack, MD [address] [place] [address]  DIAGNOSIS:  1) History of rectal cancer in 2007 status post concurrent chemoradiation followed by surgery. 2) Chronic lymphocytic leukemia diagnosed in 2007.  PRIOR THERAPY: Ibrutinib 420 mg p.o. daily first 2007 and discontinued August 26, 2023 secondary to disease progression.  CURRENT THERAPY: Acalabrutinib 100 mg p.o. twice daily.  Expected to start the next few days.  INTERVAL HISTORY: Aaron Gonzalez 77 y.o. male returns to the clinic today for follow-up visit.Discussed the use of AI scribe software for clinical note transcription with the patient, who gave verbal consent to proceed.  History of Present Illness   The patient, a 77 year old diagnosed with chronic lymphocytic leukemia (CLL) in 2019, has been on a daily regimen of Ibrutinib 420mg  since diagnosis. He reports no side effects from the treatment and denies any recent changes in health such as swollen lymph nodes, fatigue, weakness, or unintentional weight loss. However, he does report experiencing some night sweats and swelling in the ankles. The patient's blood pressure has also been noted to be high.       MEDICAL HISTORY: Past Medical History:  Diagnosis Date   Arthritis    CLL (chronic lymphocytic leukemia) (HCC)    DVT (deep venous thrombosis) (HCC)    History of rectal cancer    Hypertension    Stroke (HCC)     ALLERGIES:  has No Known Allergies.  MEDICATIONS:  Current Outpatient Medications  Medication Sig Dispense Refill   amLODipine (NORVASC) 10 MG tablet Take 1 tablet by mouth daily.     blood glucose meter kit and supplies KIT Dispense based on patient and insurance preference. Use up to four times daily as directed. 1 each 0   carvedilol (COREG) 6.25 MG tablet TAKE 1 TABLET(6.25 MG) BY MOUTH TWICE DAILY 180 tablet 3   cyclobenzaprine  (FLEXERIL) 10 MG tablet Take 1 tablet (10 mg total) by mouth 3 (three) times daily as needed for muscle spasms. 30 tablet 2   ibrutinib (IMBRUVICA) 420 MG tablet Take 1 tablet (420 mg total) by mouth daily. Take with a glass of water. 28 tablet 3   meloxicam (MOBIC) 15 MG tablet TAKE 1 TABLET(15 MG) BY MOUTH DAILY 30 tablet 2   No current facility-administered medications for this visit.    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   Procedure: Left TOTAL HIP ARTHROPLASTY ANTERIOR APPROACH;  Surgeon: Kathryne Hitch, MD;  Location: [facility];  Service: Orthopedics;  Laterality: Left;  RNFA    REVIEW OF SYSTEMS:  Constitutional: positive for fatigue and night sweats Eyes: negative Ears, nose, mouth, throat, and face: negative Respiratory: negative Cardiovascular: negative Gastrointestinal: negative Genitourinary:negative Integument/breast: negative Hematologic/lymphatic: negative Musculoskeletal:negative Neurological: negative Behavioral/Psych: negative Endocrine: negative Allergic/Immunologic: negative   PHYSICAL EXAMINATION: General appearance: alert, cooperative, fatigued, and no distress Head: Normocephalic, without obvious abnormality, atraumatic Neck: no adenopathy, no JVD, supple, symmetrical, trachea midline, and thyroid not enlarged, symmetric, no tenderness/mass/nodules Lymph nodes: Cervical, supraclavicular, and axillary nodes normal. Resp: clear to auscultation bilaterally Back: symmetric, no curvature. ROM normal. No CVA tenderness. Cardio: regular rate and rhythm, S1, S2 normal, no murmur, click, rub or gallop GI: soft, non-tender; bowel sounds normal; no masses,  no organomegaly Extremities: extremities normal, atraumatic, no  cyanosis or edema Neurologic: Alert and oriented X 3, normal strength and tone. Normal symmetric reflexes. Normal coordination and  gait  ECOG PERFORMANCE STATUS: 1 - Symptomatic but completely ambulatory  Blood pressure (!) 165/98, pulse 76, temperature 98.3 F (36.8 C), temperature source Oral, resp. rate 15, SpO2 97%.  LABORATORY DATA: Lab Results  Component Value Date   WBC 256.8 (HH) 08/26/2023   HGB 10.0 (L) 08/26/2023   HCT 32.4 (L) 08/26/2023   MCV 91.3 08/26/2023   PLT 106 (L) 08/26/2023      Chemistry      Component Value Date/Time   NA 139 06/26/2023 0812   NA 139 06/28/2021 0954   K 4.2 06/26/2023 0812   CL 105 06/26/2023 0812   CO2 29 06/26/2023 0812   BUN 14 06/26/2023 0812   BUN 11 06/28/2021 0954   CREATININE 0.92 06/26/2023 0812      Component Value Date/Time   CALCIUM 9.0 06/26/2023 0812   ALKPHOS 45 06/26/2023 0812   AST 17 06/26/2023 0812   ALT 10 06/26/2023 0812   BILITOT 1.3 (H) 06/26/2023 0812       RADIOGRAPHIC STUDIES: No results found.  ASSESSMENT AND PLAN: This is a very pleasant 77 years old African-American male with history of rectal cancer diagnosed in 2007 status post concurrent chemoradiation followed by surgical resection.  At the same time the patient was diagnosed with chronic lymphocytic leukemia and has been in observation initially for several years but started on treatment with ibrutinib 420 mg in 2019. The patient has been feeling fine with no concerning complaints except for the recent night sweats. Assessment and Plan    Chronic Lymphocytic Leukemia (CLL) Significant increase in total blood count from 28,000 to 256,000.8 indicating that current treatment with Ibrutinib 420mg  daily is no longer effective. Discussed two alternative treatment options:Acalabrutinib (similar to Ibrutinib) or a combination of Obinutuzumab and Venetoclax (fixed duration treatment for one year). He preferred treatment with acalabrutinib. -Discontinue Ibrutinib. -Start Acalabrutinib 100mg  twice daily per patient preference. -Pharmacist to provide education on  Acalabrutinib. -Closely monitor labs for tumor lysis syndrome. -Follow-up in 2-3 weeks to check labs and assess response to new treatment.  Hypertension Patient did not take blood pressure medication today. -Continue current blood pressure medication as prescribed.  Night Sweats and Leg Swelling New symptoms reported by the patient. -Monitor symptoms closely.  General Health Maintenance -Continue to monitor for any new swollen glands or changes in health status.   He was advised to call immediately if he has any concerning symptoms in the interval. The patient voices understanding of current disease status and treatment options and is in agreement with the current care plan. All questions were answered. The patient knows to call the clinic with any problems, questions or concerns. We can certainly see the patient much sooner if necessary.  Disclaimer: This note was dictated with voice recognition software. Similar sounding words can inadvertently be transcribed and may not be corrected upon review.

## 2023-08-26 NOTE — Telephone Encounter (Signed)
 CRITICAL VALUE STICKER  CRITICAL VALUE: WBC 256.8  RECEIVER (on-site recipient of call): A , RN  DATE & TIME NOTIFIED: 08/26/23 at [phone]  MESSENGER (representative from lab):  MD NOTIFIED: C. Heilingoetter, PA for Dr. Arbutus Ped  TIME OF NOTIFICATION: 0940  RESPONSE:

## 2023-08-27 ENCOUNTER — Other Ambulatory Visit: Payer: Self-pay | Admitting: Internal Medicine

## 2023-08-27 MED ORDER — ALLOPURINOL 100 MG PO TABS: 100.0000 mg | ORAL_TABLET | Freq: Two times a day (BID) | ORAL | 2 refills | Status: DC

## 2023-08-28 ENCOUNTER — Other Ambulatory Visit: Payer: Self-pay

## 2023-08-28 ENCOUNTER — Other Ambulatory Visit (HOSPITAL_COMMUNITY): Payer: Self-pay

## 2023-08-28 ENCOUNTER — Other Ambulatory Visit: Payer: Self-pay | Admitting: Pharmacy Technician

## 2023-08-28 NOTE — Telephone Encounter (Signed)
Oral Chemotherapy Pharmacist Encounter   Attempted to reach patient to provide update and offer for initial counseling on oral medication: Calquence (acalabrutinib).   No answer. Left voicemail for patient to call back to discuss details of medication acquisition and initial counseling session.  Lenord Carbo, PharmD, BCPS, [facility] Hematology/Oncology Clinical Pharmacist [facility] Oral Chemotherapy Navigation Clinics [phone] 08/28/2023 9:43 AM

## 2023-08-28 NOTE — Progress Notes (Signed)
Specialty Pharmacy Initial Fill Coordination Note  Aaron Gonzalez is a 77 y.o. male contacted today regarding refills of specialty medication(s) Acalabrutinib Maleate .  Patient requested Delivery  on 08/29/23  to verified address [address]   [place] [address]   Medication will be filled on 08/28/23.   Patient is aware of $0 copayment.

## 2023-08-28 NOTE — Progress Notes (Signed)
Oral Chemotherapy Pharmacist Encounter  Patient was counseled under telephone encounter from 08/26/23.  Lenord Carbo, PharmD, BCPS, BCOP Hematology/Oncology Clinical Pharmacist [facility] Oral Chemotherapy Navigation Clinics [phone] 08/28/2023 11:22 AM

## 2023-08-28 NOTE — Telephone Encounter (Signed)
Oral Chemotherapy Pharmacist Encounter  I spoke with patient for overview of: Calquence (acalabrutinib) for the treatment of CLL, planned duration until disease progression or unacceptable toxicity.   Counseled patient on administration, dosing, side effects, monitoring, drug-food interactions, safe handling, storage, and disposal.  Patient will take Calquence 100mg  tablets, 1 tablet by mouth approximately 12 hours apart, with or with out food, with a glass of water.  Calquence start date: 08/29/23  Adverse effects include but are not limited to: headache, diarrhea, fatigue, rash, muscle pain, bruising, decreased blood counts, and altered cardiac conduction.   TLS PPX: Dr. Arbutus Ped has sent in Rx for allopurinol 100 mg BID for patient - I have educated patient on starting the allopurinol with the Calquence for TLS prevention. Patient knows to pick this up from his local [employer].  Headache: discussed HA from Calquence are responsive to APAP PRN or a small cup of caffeine.  Diarrhea: patient will alert office if he has 4 or more episodes of diarrhea over his baseline. Recommended patient pick up Imodium from the pharmacy to have on hand to use PRN diarrhea.  Reviewed with patient importance of keeping a medication schedule and plan for any missed doses. No barriers to medication adherence identified.  Medication reconciliation performed and medication/allergy list updated.  All questions answered.  Aaron Gonzalez voiced understanding and appreciation.   Medication education handout placed in mail for patient. Patient knows to call the office with questions or concerns. Oral Chemotherapy Clinic phone number provided to patient.   Lenord Carbo, PharmD, BCPS, BCOP Hematology/Oncology Clinical Pharmacist [facility] Oral Chemotherapy Navigation Clinics [phone] 08/28/2023 11:18 AM

## 2023-09-09 ENCOUNTER — Inpatient Hospital Stay: Payer: Medicare HMO | Admitting: Internal Medicine

## 2023-09-09 ENCOUNTER — Inpatient Hospital Stay: Payer: Medicare HMO

## 2023-09-09 ENCOUNTER — Inpatient Hospital Stay (HOSPITAL_BASED_OUTPATIENT_CLINIC_OR_DEPARTMENT_OTHER): Payer: Medicare HMO | Admitting: Physician Assistant

## 2023-09-09 ENCOUNTER — Telehealth: Payer: Self-pay | Admitting: Medical Oncology

## 2023-09-09 ENCOUNTER — Other Ambulatory Visit: Payer: Self-pay

## 2023-09-09 VITALS — BP 139/87 | HR 80 | Temp 98.3°F | Resp 18 | Wt 161.9 lb

## 2023-09-09 DIAGNOSIS — C9110 Chronic lymphocytic leukemia of B-cell type not having achieved remission: Secondary | ICD-10-CM | POA: Diagnosis not present

## 2023-09-09 LAB — CMP (CANCER CENTER ONLY)
ALT: 12 U/L (ref 0–44)
AST: 17 U/L (ref 15–41)
Albumin: 4.3 g/dL (ref 3.5–5.0)
Alkaline Phosphatase: 70 U/L (ref 38–126)
Anion gap: 3 — ABNORMAL LOW (ref 5–15)
BUN: 16 mg/dL (ref 8–23)
CO2: 30 mmol/L (ref 22–32)
Calcium: 9.4 mg/dL (ref 8.9–10.3)
Chloride: 106 mmol/L (ref 98–111)
Creatinine: 0.87 mg/dL (ref 0.61–1.24)
GFR, Estimated: >60 mL/min (ref >60)
Glucose, Bld: 87 mg/dL (ref 70–99)
Potassium: 4 mmol/L (ref 3.5–5.1)
Sodium: 139 mmol/L (ref 135–145)
Total Bilirubin: 0.9 mg/dL (ref <1.2)
Total Protein: 6.6 g/dL (ref 6.5–8.1)

## 2023-09-09 LAB — CBC WITH DIFFERENTIAL (CANCER CENTER ONLY)
Abs Immature Granulocytes: 0.3 10*3/uL — ABNORMAL HIGH (ref 0.00–0.07)
Basophils Absolute: 0.2 10*3/uL — ABNORMAL HIGH (ref 0.0–0.1)
Basophils Relative: 0 %
Eosinophils Absolute: 1.4 10*3/uL — ABNORMAL HIGH (ref 0.0–0.5)
Eosinophils Relative: 1 %
HCT: 32.8 % — ABNORMAL LOW (ref 39.0–52.0)
Hemoglobin: 10.1 g/dL — ABNORMAL LOW (ref 13.0–17.0)
Immature Granulocytes: 0 %
Lymphocytes Relative: 91 %
Lymphs Abs: 121.7 10*3/uL — ABNORMAL HIGH (ref 0.7–4.0)
MCH: 29.1 pg (ref 26.0–34.0)
MCHC: 30.8 g/dL (ref 30.0–36.0)
MCV: 94.5 fL (ref 80.0–100.0)
Monocytes Absolute: 6.4 10*3/uL — ABNORMAL HIGH (ref 0.1–1.0)
Monocytes Relative: 5 %
Neutro Abs: 3.9 10*3/uL (ref 1.7–7.7)
Neutrophils Relative %: 3 %
Platelet Count: 143 10*3/uL — ABNORMAL LOW (ref 150–400)
RBC: 3.47 MIL/uL — ABNORMAL LOW (ref 4.22–5.81)
RDW: 20.6 % — ABNORMAL HIGH (ref 11.5–15.5)
Smear Review: NORMAL
WBC Count: 133.7 10*3/uL (ref 4.0–10.5)
nRBC: 0 % (ref 0.0–0.2)

## 2023-09-09 LAB — LACTATE DEHYDROGENASE: LDH: 635 U/L — ABNORMAL HIGH (ref 98–192)

## 2023-09-09 LAB — URIC ACID: Uric Acid, Serum: 4 mg/dL (ref 3.7–8.6)

## 2023-09-09 NOTE — Progress Notes (Signed)
[facility]    Patient Care Team: Ellyn Hack, MD as PCP - General (Family Medicine) [place], Peter M, MD as PCP - Cardiology (Cardiology)    Name / MRN / DOB: Aaron Gonzalez  [id number]  [birth year]   Date of visit: 09/09/2023   Chief Complaint/Reason for visit: toxicity check   Current Therapy: Acalabrutinib      ASSESSMENT & PLAN: Patient is a 77 y.o. male with oncologic history of CML followed by Dr. Arbutus Ped.  I have viewed most recent oncology note and lab work.    #CML -Recently started Acalabrutinib on 08/29/23 -Labs today show leukocytosis has decreased from 256.8 to 133.7 since visit x  weeks ago. Anemia is stable and thrombocytopenia improving. CMP and uric acid level are unremarkable. LDH has decreased from 5,205 to 635 today. -Patient will return in 2 weeks for labs and MD visit. He knows to call sooner if any new symptoms develop.      Heme/Onc History: Oncology History  CLL (chronic lymphocytic leukemia) (HCC)  04/16/2021 Initial Diagnosis   CLL (chronic lymphocytic leukemia) (HCC)   04/16/2021 Cancer Staging   Staging form: Chronic Lymphocytic Leukemia / Small Lymphocytic Lymphoma, AJCC 8th Edition - Clinical: Modified Rai Stage 0 (Modified Rai risk: Low) - Signed by Si Gaul, MD on 04/16/2021       Interval history-: Discussed the use of AI scribe software for clinical note transcription with the patient, who gave verbal consent to proceed.   Aaron Gonzalez is a 77 y.o. male with oncologic history as above presenting to today for toxicity check. Patient presents unaccompanied to visit today.   Patient recently started Acalabrutinib after disease progression while taking Ibrutinib. He reports he is tolerating the medication well overall. He initially experienced a headache, which was mild and resolved on its own after a few days. He did not report any other side effects such as diarrhea, fatigue, or coughing.  The patient  maintains an active lifestyle, including regular gym visits, and has not noticed any changes in his energy levels or physical capabilities.The patient does not report any fever, chills, shortness of breath.   ROS  All other systems are reviewed and are negative for acute change except as noted in the HPI.    No Known Allergies   Past Medical History:  Diagnosis Date   Arthritis    CLL (chronic lymphocytic leukemia) (HCC)    DVT (deep venous thrombosis) (HCC)    History of rectal cancer    Hypertension    Stroke [facility])      Past Surgical History:  Procedure Laterality Date   COLECTOMY     with colostomy   TOTAL HIP ARTHROPLASTY Left 10/26/2021   Procedure: Left TOTAL HIP ARTHROPLASTY ANTERIOR APPROACH;  Surgeon: Kathryne Hitch, MD;  Location: [facility];  Service: Orthopedics;  Laterality: Left;  RNFA    Social History   Socioeconomic History   Marital status: Divorced    Spouse name: Not on file   Number of children: 2   Years of education: Not on file   Highest education level: Not on file  Occupational History   Not on file  Tobacco Use   Smoking status: Former    Current packs/day: 0.50    Average packs/day: 0.5 packs/day for 10.0 years (5.0 ttl pk-yrs)    Types: Cigarettes    Passive exposure: Never   Smokeless tobacco: Never  Vaping Use   Vaping status: Never  Used  Substance and Sexual Activity   Alcohol use: Never   Drug use: Never   Sexual activity: Not on file  Other Topics Concern   Not on file  Social History Narrative   Retired Merchandiser, retail for [employer]   Social Determinants of Corporate investment banker Strain: Not on file  Food Insecurity: Not on file  Transportation Needs: Unmet Transportation Needs (09/10/2021)   PRAPARE - Administrator, Civil Service (Medical): Yes    Lack of Transportation (Non-Medical): Yes  Physical Activity: Not on file  Stress: Not on file  Social Connections: Not on file  Intimate  Partner Violence: Not on file    Family History  Problem Relation Age of Onset   Hypertension Mother    Heart failure Mother    Hypertension Father    Stroke Father    Breast cancer Sister    Stroke Brother      Current Outpatient Medications:    acalabrutinib maleate (CALQUENCE) 100 MG tablet, Take 1 tablet (100 mg total) by mouth 2 (two) times daily., Disp: 60 tablet, Rfl: 2   allopurinol (ZYLOPRIM) 100 MG tablet, Take 1 tablet (100 mg total) by mouth 2 (two) times daily., Disp: 60 tablet, Rfl: 2   amLODipine (NORVASC) 10 MG tablet, Take 1 tablet by mouth daily., Disp: , Rfl:    blood glucose meter kit and supplies KIT, Dispense based on patient and insurance preference. Use up to four times daily as directed., Disp: 1 each, Rfl: 0   carvedilol (COREG) 6.25 MG tablet, TAKE 1 TABLET(6.25 MG) BY MOUTH TWICE DAILY, Disp: 180 tablet, Rfl: 3   cyclobenzaprine (FLEXERIL) 10 MG tablet, Take 1 tablet (10 mg total) by mouth 3 (three) times daily as needed for muscle spasms., Disp: 30 tablet, Rfl: 2   meloxicam (MOBIC) 15 MG tablet, TAKE 1 TABLET(15 MG) BY MOUTH DAILY, Disp: 30 tablet, Rfl: 2  PHYSICAL EXAM: ECOG FS:1 - Symptomatic but completely ambulatory    Vitals:   09/09/23 1338 09/09/23 1340  BP: (!) 148/86 139/87  Pulse: 80   Resp: 18   Temp: 98.3 F (36.8 C)   TempSrc: Temporal   SpO2: 97%   Weight: 161 lb 14.4 oz (73.4 kg)    Physical Exam Vitals and nursing note reviewed.  Constitutional:      Appearance: He is not ill-appearing or toxic-appearing.  HENT:     Head: Normocephalic.  Eyes:     Conjunctiva/sclera: Conjunctivae normal.  Cardiovascular:     Rate and Rhythm: Normal rate and regular rhythm.     Pulses: Normal pulses.     Heart sounds: Normal heart sounds.  Pulmonary:     Effort: Pulmonary effort is normal.     Breath sounds: Normal breath sounds.  Abdominal:     General: There is no distension.  Musculoskeletal:     Cervical back: Normal range of  motion.     Right lower leg: Edema present.     Left lower leg: Edema present.     Comments: Chronic BLE per patient, unchanged today  Skin:    General: Skin is warm and dry.  Neurological:     Mental Status: He is alert.        LABORATORY DATA: I have reviewed the data as listed    Latest Ref Rng & Units 09/09/2023    1:04 PM 08/26/2023    8:59 AM 06/26/2023    8:12 AM  CBC  WBC 4.0 -  10.5 K/uL 133.7  256.8  28.0   Hemoglobin 13.0 - 17.0 g/dL 16.1  09.6  04.5   Hematocrit 39.0 - 52.0 % 32.8  32.4  34.9   Platelets 150 - 400 K/uL 143  106  199         Latest Ref Rng & Units 09/09/2023    1:04 PM 08/26/2023    8:59 AM 06/26/2023    8:12 AM  CMP  Glucose 70 - 99 mg/dL 87  75  83   BUN 8 - 23 mg/dL 16  22  14    Creatinine 0.61 - 1.24 mg/dL 4.09  8.11  9.14   Sodium 135 - 145 mmol/L 139  140  139   Potassium 3.5 - 5.1 mmol/L 4.0  3.5  4.2   Chloride 98 - 111 mmol/L 106  107  105   CO2 22 - 32 mmol/L 30  22  29    Calcium 8.9 - 10.3 mg/dL 9.4  8.9  9.0   Total Protein 6.5 - 8.1 g/dL 6.6  9.6  6.3   Total Bilirubin <1.2 mg/dL 0.9  1.4  1.3   Alkaline Phos 38 - 126 U/L 70  100  45   AST 15 - 41 U/L 17  76  17   ALT 0 - 44 U/L 12  64  10        RADIOGRAPHIC STUDIES (from last 24 hours if applicable) I have personally reviewed the radiological images as listed and agreed with the findings in the report. No results found.      Visit Diagnosis: 1. CLL (chronic lymphocytic leukemia) (HCC)      No orders of the defined types were placed in this encounter.   All questions were answered. The patient knows to call the clinic with any problems, questions or concerns. No barriers to learning was detected.  A total of more than 20 minutes were spent on this encounter with face-to-face time and non-face-to-face time, including preparing to see the patient, ordering tests, counseling the patient and coordination of care as outlined above.    Thank you for allowing me to  participate in the care of this patient.    Shanon Ace, PA-C Department of Hematology/Oncology [facility] Phone: [phone]  [phone]    09/09/2023 2:31 PM

## 2023-09-09 NOTE — Telephone Encounter (Signed)
CRITICAL VALUE STICKER  CRITICAL VALUE: WBC=133.7k RECEIVER (on-site recipient of call):  DATE & TIME NOTIFIED: 09/09/23 @ 1330  MESSENGER (representative from lab):Pam  MD NOTIFIED: Public house manager, PA  TIME OF NOTIFICATION:1330  RESPONSE:  Pt is being seen today

## 2023-09-09 NOTE — Patient Instructions (Addendum)
You labs look good today overall. Your white blood cell count is decreasing.    Our scheduler will make an appointment for you to have labs and see Dr. Arbutus Ped or Cassie PA-C in 2 weeks. They will call to notify you of the appointment.

## 2023-09-09 NOTE — Telephone Encounter (Signed)
Sister reported Aaron Gonzalez resides at [facility].   It is his responsibility to sign up for his transportation to  his or his appts.  He may not have signed up for today's appts. I left two messages for pt to return my call.

## 2023-09-09 NOTE — Telephone Encounter (Signed)
No Show today -LVM on pts phone to return my call.

## 2023-09-09 NOTE — Telephone Encounter (Signed)
Pt stated

## 2023-09-11 ENCOUNTER — Other Ambulatory Visit (HOSPITAL_COMMUNITY): Payer: Self-pay

## 2023-09-16 ENCOUNTER — Ambulatory Visit: Payer: Medicare HMO | Admitting: Podiatry

## 2023-09-16 ENCOUNTER — Telehealth: Payer: Self-pay | Admitting: Medical Oncology

## 2023-09-16 ENCOUNTER — Encounter: Payer: Self-pay | Admitting: Podiatry

## 2023-09-16 ENCOUNTER — Other Ambulatory Visit: Payer: Self-pay

## 2023-09-16 DIAGNOSIS — B351 Tinea unguium: Secondary | ICD-10-CM

## 2023-09-16 DIAGNOSIS — M79675 Pain in left toe(s): Secondary | ICD-10-CM

## 2023-09-16 DIAGNOSIS — S90822A Blister (nonthermal), left foot, initial encounter: Secondary | ICD-10-CM

## 2023-09-16 DIAGNOSIS — I739 Peripheral vascular disease, unspecified: Secondary | ICD-10-CM | POA: Diagnosis not present

## 2023-09-16 DIAGNOSIS — M79674 Pain in right toe(s): Secondary | ICD-10-CM

## 2023-09-16 NOTE — Patient Instructions (Addendum)
Apply Neosporin Cream to left foot once daily. Call office for a sooner appointment if condition worsens.  Wear shoes with stretchable uppers and memory foam insoles daily. You may purchase Skechers Slip Ins from [employer].  Do not walk barefoot or with grip socks alone on in the house. You must have houseslippers with memory foam insoles when walking in your home.  Avoid leather shoe gear and tight shoe gear.

## 2023-09-16 NOTE — Progress Notes (Signed)
  Subjective:  Patient ID: Aaron Gonzalez, male    DOB: [birth year],  MRN: [id number]  77 y.o. male presents at risk foot care. Patient has h/o PAD and painful thick toenails that are difficult to trim. Pain interferes with ambulation. Aggravating factors include wearing enclosed shoe gear. Pain is relieved with periodic professional debridement.  Chief Complaint  Patient presents with   Routine Post Op    PATIENT STATES "HE HAS NOT HAD ANY PROBLEMS WITH HIS FEET , ANKLES JUST SWELLED THAT'S ALL"   Patient states he had a blister on top of his left foot that popped this morning. States he had worn a pair of shoes that was too tight because his feet were swollen.  PCP is Ellyn Hack, MD , and last visit was December 30, 2022.  No Known Allergies  Review of Systems: Negative except as noted in the HPI.   Objective:  MADAN HANNEL is a pleasant 77 y.o. male WD, WN in NAD. AAO x 3.  Vascular Examination: CFT <4 seconds b/l LE. Palpable DP pulse(s) b/l LE. Nonpalpable PT pulse(s) b/l LE. No pain with calf compression b/l. Skin temperature gradient WNL b/l. No varicosities noted. No cyanosis or clubbing noted.  Neurological Examination: Sensation grossly intact b/l with 10 gram monofilament. Vibratory sensation decreased b/l.  Dermatological Examination:    Deroofed blister noted dorsal midfoot LLE No erythema, no drainage. Superficial wound measures 0.7 x 1.0  x 0.1 cm.   Raised benign papule noted anterior aspect left ankle. Annular with no asymmetry, normal border, normal color.  Pedal skin with normal turgor, texture and tone b/l. No interdigital macerations noted. Toenails 1-5 b/l thick, discolored, elongated with subungual debris and pain on dorsal palpation. No hyperkeratotic lesions noted b/l.   Musculoskeletal Examination: Muscle strength 5/5 to b/l LE.  No pain, crepitus noted b/l. No gross pedal deformities. Utilizes cane for ambulation assistance.  Radiographs:  None  Last A1c:       No data to display          Assessment:   1. Pain due to onychomycosis of toenails of both feet   2. Blister of left foot, initial encounter   3. PAD (peripheral artery disease) (HCC)    Plan:  -Consent given for treatment as described below: -Examined patient. -Blister left foot cleansed with alcohol. TAO and band-aid applied. He was instructed to apply Neosporin to left foot once daily. -Toenails 1-5 b/l were debrided in length and girth with sterile nail nippers and dremel without iatrogenic bleeding.  -Shoe recommendations given for Skechers. -Patient to follow up 2-3 weeks for left foot check . -Patient/POA to call should there be question/concern in the interim. -Follow up with me in 3 months.  [name], DPM      [facility] LOCATION: [address], [place] [address]                   Office [phone]   [facility] LOCATION: [address] [place], [place] [address] Office [phone]

## 2023-09-16 NOTE — Telephone Encounter (Signed)
11/23-sat he  took his Calquence at breakfast .  30 minutes later he had tingling in hands and feet ,felt nauseous. Symptoms dissipated in 20 minutes. He was able to eat breakfast. No other episodes since .

## 2023-09-21 NOTE — Progress Notes (Unsigned)
[facility] OFFICE PROGRESS NOTE  Ellyn Hack, MD [address] [place] [address]  DIAGNOSIS: 1) History of rectal cancer in 2007 status post concurrent chemoradiation followed by surgery. 2) Chronic lymphocytic leukemia diagnosed in 2007.  PRIOR THERAPY: Ibrutinib 420 mg p.o. daily first 2007 and discontinued August 26, 2023 secondary to disease progression.   CURRENT THERAPY: Acalabrutinib 100 mg p.o. twice daily. First dose on ~08/28/23  INTERVAL HISTORY: Aaron Gonzalez 77 y.o. male returns to the clinic today for a follow-up visit.  The patient was last seen in clinic by Dr. Arbutus Ped on 08/26/2023.  At that point in time, the patient had been on ibrutinib since 2019.  He was recently found to have evidence of disease progression at his last appointment his treatment was switched to acalabrutinib.  He started this around 08/28/2023.  He was seen for 2-week follow-up toxicity check on 09/09/2023.  The patient has been tolerating this well without any concerning adverse side effects. He mentioned an incident at his appointment today that occurred about 4 days after starting his acalabruitib where he had tingling in his fingertips and bright lights in his vision. EMS was called by his symptoms were wearing off when they arrived. Therefore, he did not go to the ER. He has not had anymore episodes of this occurring. [facility] lives at a [facility] community.   Today, he denies any diarrhea.  Denies any unusual fatigue.  The patient maintains an active lifestyle with regular visits to the gym at least 2x per week. and he has not noticed any change in his energy.  He denies any fever, chills, night sweats. He lost weight since last being seen but denies any changes with his diet or activity.  Denies any nasal congestion, sore throat, shortness of breath, skin infections, or dysuria.  Denies any abnormal bleeding or bruising.  He denies lymphadenopathy.  He is compliant with his  allopurinol.  He is here today for evaluation of repeat blood work.   MEDICAL HISTORY: Past Medical History:  Diagnosis Date   Arthritis    CLL (chronic lymphocytic leukemia) (HCC)    DVT (deep venous thrombosis) (HCC)    History of rectal cancer    Hypertension    Stroke (HCC)     ALLERGIES:  has No Known Allergies.  MEDICATIONS:  Current Outpatient Medications  Medication Sig Dispense Refill   acalabrutinib maleate (CALQUENCE) 100 MG tablet Take 1 tablet (100 mg total) by mouth 2 (two) times daily. 60 tablet 2   allopurinol (ZYLOPRIM) 100 MG tablet Take 1 tablet (100 mg total) by mouth 2 (two) times daily. 60 tablet 2   amLODipine (NORVASC) 10 MG tablet Take 1 tablet by mouth daily.     blood glucose meter kit and supplies KIT Dispense based on patient and insurance preference. Use up to four times daily as directed. 1 each 0   carvedilol (COREG) 6.25 MG tablet TAKE 1 TABLET(6.25 MG) BY MOUTH TWICE DAILY 180 tablet 3   cyclobenzaprine (FLEXERIL) 10 MG tablet Take 1 tablet (10 mg total) by mouth 3 (three) times daily as needed for muscle spasms. 30 tablet 2   meloxicam (MOBIC) 15 MG tablet TAKE 1 TABLET(15 MG) BY MOUTH DAILY 30 tablet 2   No current facility-administered medications for this visit.    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  Procedure: Left TOTAL HIP ARTHROPLASTY ANTERIOR APPROACH;  Surgeon: Kathryne Hitch, MD;  Location: [facility];  Service: Orthopedics;  Laterality: Left;  RNFA    REVIEW OF SYSTEMS:   Review of Systems  Constitutional: Negative for appetite change, chills, fatigue, fever and unexpected weight change.  HENT: Negative for mouth sores, nosebleeds, sore throat and trouble swallowing.   Eyes: Negative for eye problems and icterus.  Respiratory: Negative for cough, hemoptysis, shortness of breath and wheezing.   Cardiovascular: Negative  for chest pain and leg swelling.  Gastrointestinal: Negative for abdominal pain, constipation, diarrhea, nausea and vomiting.  Genitourinary: Negative for bladder incontinence, difficulty urinating, dysuria, frequency and hematuria.   Musculoskeletal: Negative for back pain, gait problem, neck pain and neck stiffness.  Skin: Negative for itching and rash.  Neurological: Negative for dizziness, extremity weakness, gait problem, headaches, light-headedness and seizures.  Hematological: Negative for adenopathy. Does not bruise/bleed easily.  Psychiatric/Behavioral: Negative for confusion, depression and sleep disturbance. The patient is not nervous/anxious.     PHYSICAL EXAMINATION:  Blood pressure 132/78, pulse 77, temperature 98.2 F (36.8 C), temperature source Oral, resp. rate 18, height 5\' 5"  (1.651 m), weight 157 lb 1 oz (71.2 kg), SpO2 98%.  ECOG PERFORMANCE STATUS: 0  Physical Exam  Constitutional: Oriented to person, place, and time and well-developed, well-nourished, and in no distress.  HENT:  Head: Normocephalic and atraumatic.  Mouth/Throat: Oropharynx is clear and moist. No oropharyngeal exudate.  Eyes: Conjunctivae are normal. Right eye exhibits no discharge. Left eye exhibits no discharge. No scleral icterus.  Neck: Normal range of motion. Neck supple.  Cardiovascular: Normal rate, regular rhythm, normal heart sounds and intact distal pulses.   Pulmonary/Chest: Effort normal and breath sounds normal. No respiratory distress. No wheezes. No rales.  Abdominal: Soft. Bowel sounds are normal. Exhibits no distension and no mass. There is no tenderness.  Musculoskeletal: Normal range of motion. Exhibits no edema.  Lymphadenopathy:    No cervical adenopathy.  Neurological: Alert and oriented to person, place, and time. Exhibits normal muscle tone. Gait normal. Coordination normal.  Skin: Skin is warm and dry. No rash noted. Not diaphoretic. No erythema. No pallor.  Psychiatric:  Mood, memory and judgment normal.  Vitals reviewed.  LABORATORY DATA: Lab Results  Component Value Date   WBC 54.1 (HH) 09/24/2023   HGB 11.1 (L) 09/24/2023   HCT 35.1 (L) 09/24/2023   MCV 94.4 09/24/2023   PLT 159 09/24/2023      Chemistry      Component Value Date/Time   NA 138 09/24/2023 1049   NA 139 06/28/2021 0954   K 3.8 09/24/2023 1049   CL 104 09/24/2023 1049   CO2 29 09/24/2023 1049   BUN 15 09/24/2023 1049   BUN 11 06/28/2021 0954   CREATININE 0.89 09/24/2023 1049      Component Value Date/Time   CALCIUM 9.3 09/24/2023 1049   ALKPHOS 60 09/24/2023 1049   AST 16 09/24/2023 1049   ALT 10 09/24/2023 1049   BILITOT 1.1 09/24/2023 1049       RADIOGRAPHIC STUDIES:  No results found.   ASSESSMENT/PLAN:  This is a very pleasant 77 year old African-American male with a history of rectal cancer in 2007.  He is status post concurrent chemoradiation followed by surgical resection.  At the same time he was diagnosed with chronic lymphocytic leukemia and he has been on observation initially for several years which was started on treatment with ibrutinib 420 mg in 2019.  He was found  to have evidence of disease progression in early November 2024.  Therefore he was started on acalabrutinib around 08/28/2023.  He is tolerating this well without any concerning adverse side effects.  Labs were reviewed. His WBC is improving since starting acalabrutinib. His WBC was 256.8 (11/5)-->133.7 (11/19)-->and 54.1 (today). The patient was seen with Dr. Arbutus Ped. Dr. Arbutus Ped recommends he continue on the same treatment at the same dose.   We will see him back for follow-up visit and repeat labs in 1 month.  The patient is compliant with his allopurinol.  The patient was advised to call immediately if she has any concerning symptoms in the interval. The patient voices understanding of current disease status and treatment options and is in agreement with the current care plan. All  questions were answered. The patient knows to call the clinic with any problems, questions or concerns. We can certainly see the patient much sooner if necessary    Orders Placed This Encounter  Procedures   CBC with Differential (Cancer Center Only)    Standing Status:   Future    Standing Expiration Date:   09/23/2024   CMP (Cancer Center only)    Standing Status:   Future    Standing Expiration Date:   09/23/2024   Lactate dehydrogenase (LDH)    Standing Status:   Future    Standing Expiration Date:   09/23/2024     Johnette Abraham Yoseph Haile, PA-C 09/24/23  ADDENDUM: Hematology/Oncology Attending: I had a face-to-face encounter with the patient today.  I reviewed his record, lab, and recommended his care plan.  This is a very pleasant 77 years old African-American male with chronic lymphocytic leukemia diagnosed in 2007 as well as history of rectal cancer in 2007 treated with a course of concurrent chemoradiation.  He has been on treatment with ibrutinib for the CLL since 2007 but this was discontinued in November 2024 secondary to disease progression.  He started treatment with acalabrutinib 100 mg p.o. twice daily on August 28, 2023 and has been tolerating his treatment fairly well.  There is improvement in his total leukocyte count down to 54.100 compared to 256.800 on August 22, 2023. I recommended for the patient to continue his current treatment with acalabrutinib and we will see him back for follow-up visit in 1 months for evaluation and repeat blood work. The patient was advised to call immediately if he has any other concerning symptoms in the interval. The total time spent in the appointment was 20 minutes. Disclaimer: This note was dictated with voice recognition software. Similar sounding words can inadvertently be transcribed and may be missed upon review. Lajuana Matte, MD

## 2023-09-23 ENCOUNTER — Other Ambulatory Visit: Payer: Self-pay

## 2023-09-23 ENCOUNTER — Other Ambulatory Visit: Payer: Self-pay | Admitting: Physician Assistant

## 2023-09-23 DIAGNOSIS — C9110 Chronic lymphocytic leukemia of B-cell type not having achieved remission: Secondary | ICD-10-CM

## 2023-09-24 ENCOUNTER — Encounter: Payer: Self-pay | Admitting: Medical Oncology

## 2023-09-24 ENCOUNTER — Inpatient Hospital Stay: Payer: Medicare HMO | Admitting: Physician Assistant

## 2023-09-24 ENCOUNTER — Inpatient Hospital Stay: Payer: Medicare HMO | Attending: Internal Medicine

## 2023-09-24 VITALS — BP 132/78 | HR 77 | Temp 98.2°F | Resp 18 | Ht 65.0 in | Wt 157.1 lb

## 2023-09-24 DIAGNOSIS — Z9221 Personal history of antineoplastic chemotherapy: Secondary | ICD-10-CM | POA: Diagnosis not present

## 2023-09-24 DIAGNOSIS — C9110 Chronic lymphocytic leukemia of B-cell type not having achieved remission: Secondary | ICD-10-CM | POA: Insufficient documentation

## 2023-09-24 DIAGNOSIS — Z923 Personal history of irradiation: Secondary | ICD-10-CM | POA: Insufficient documentation

## 2023-09-24 DIAGNOSIS — Z85048 Personal history of other malignant neoplasm of rectum, rectosigmoid junction, and anus: Secondary | ICD-10-CM | POA: Insufficient documentation

## 2023-09-24 LAB — CMP (CANCER CENTER ONLY)
ALT: 10 U/L (ref 0–44)
AST: 16 U/L (ref 15–41)
Albumin: 4.1 g/dL (ref 3.5–5.0)
Alkaline Phosphatase: 60 U/L (ref 38–126)
Anion gap: 5 (ref 5–15)
BUN: 15 mg/dL (ref 8–23)
CO2: 29 mmol/L (ref 22–32)
Calcium: 9.3 mg/dL (ref 8.9–10.3)
Chloride: 104 mmol/L (ref 98–111)
Creatinine: 0.89 mg/dL (ref 0.61–1.24)
GFR, Estimated: >60 mL/min (ref >60)
Glucose, Bld: 77 mg/dL (ref 70–99)
Potassium: 3.8 mmol/L (ref 3.5–5.1)
Sodium: 138 mmol/L (ref 135–145)
Total Bilirubin: 1.1 mg/dL (ref <1.2)
Total Protein: 6.5 g/dL (ref 6.5–8.1)

## 2023-09-24 LAB — CBC WITH DIFFERENTIAL (CANCER CENTER ONLY)
Abs Immature Granulocytes: 0.12 10*3/uL — ABNORMAL HIGH (ref 0.00–0.07)
Basophils Absolute: 0.1 10*3/uL (ref 0.0–0.1)
Basophils Relative: 0 %
Eosinophils Absolute: 0.3 10*3/uL (ref 0.0–0.5)
Eosinophils Relative: 1 %
HCT: 35.1 % — ABNORMAL LOW (ref 39.0–52.0)
Hemoglobin: 11.1 g/dL — ABNORMAL LOW (ref 13.0–17.0)
Immature Granulocytes: 0 %
Lymphocytes Relative: 89 %
Lymphs Abs: 48.5 10*3/uL — ABNORMAL HIGH (ref 0.7–4.0)
MCH: 29.8 pg (ref 26.0–34.0)
MCHC: 31.6 g/dL (ref 30.0–36.0)
MCV: 94.4 fL (ref 80.0–100.0)
Monocytes Absolute: 1.6 10*3/uL — ABNORMAL HIGH (ref 0.1–1.0)
Monocytes Relative: 3 %
Neutro Abs: 3.6 10*3/uL (ref 1.7–7.7)
Neutrophils Relative %: 7 %
Platelet Count: 159 10*3/uL (ref 150–400)
RBC: 3.72 MIL/uL — ABNORMAL LOW (ref 4.22–5.81)
RDW: 21.8 % — ABNORMAL HIGH (ref 11.5–15.5)
Smear Review: NORMAL
WBC Count: 54.1 10*3/uL (ref 4.0–10.5)
nRBC: 0.1 % (ref 0.0–0.2)

## 2023-09-24 LAB — LACTATE DEHYDROGENASE: LDH: 255 U/L — ABNORMAL HIGH (ref 98–192)

## 2023-09-24 NOTE — Progress Notes (Signed)
CRITICAL VALUE STICKER  CRITICAL VALUE: WBC=54.1 k  RECEIVER (on-site recipient of call):Aaron Gonzalez  DATE & TIME NOTIFIED: 09/24/2023 @ 1133  MESSENGER (representative from lab):Jessica  MD NOTIFIED: Heilingoetter, PA-C  TIME OF NOTIFICATION:1134  RESPONSE:  Pt scheduled today with Cassie.

## 2023-09-29 ENCOUNTER — Other Ambulatory Visit (HOSPITAL_COMMUNITY): Payer: Self-pay

## 2023-10-02 ENCOUNTER — Ambulatory Visit: Payer: Medicare HMO | Admitting: Podiatry

## 2023-10-02 ENCOUNTER — Encounter: Payer: Self-pay | Admitting: Podiatry

## 2023-10-02 DIAGNOSIS — I739 Peripheral vascular disease, unspecified: Secondary | ICD-10-CM

## 2023-10-02 DIAGNOSIS — S90822D Blister (nonthermal), left foot, subsequent encounter: Secondary | ICD-10-CM | POA: Diagnosis not present

## 2023-10-02 NOTE — Patient Instructions (Addendum)
Your vascular testing [facility] performed [facility]  [address] [address] [place], [place] [address] Phone number [phone]

## 2023-10-05 NOTE — Progress Notes (Signed)
  Subjective:  Patient ID: Aaron Gonzalez, male    DOB: 08/20/1946,  MRN: [id number]  77 y.o. male presents for follow-up evaluation of left dorsal foot blister.  Patient was last seen by Dr. Eloy End on 09/16/2023.  Patient has history of PAD.  He reports that the blister has been healing well.  He denies any pain.  Chief Complaint  Patient presents with   Foot Pain    f/u blister top of left foot - healed.  Doing good    PCP is Ellyn Hack, MD , and last visit was December 30, 2022.  No Known Allergies  Review of Systems: Negative except as noted in the HPI.   Objective:  Aaron Gonzalez is a pleasant 77 y.o. male WD, WN in NAD. AAO x 3.  Vascular Examination: CFT <4 seconds b/l LE. Palpable DP pulse(s) b/l LE. Nonpalpable PT pulse(s) b/l LE. No pain with calf compression b/l. Skin temperature gradient WNL b/l. No varicosities noted. No cyanosis or clubbing noted.  Neurological Examination: Sensation grossly intact b/l with 10 gram monofilament. Vibratory sensation decreased b/l.  Dermatological Examination:  Site of deroofed blister appears resolved at this point.  No underlying wound appreciated.  No surrounding erythema present.  No other open wounds noted.  Raised benign papule noted anterior aspect left ankle. Annular with no asymmetry, normal border, normal color.  Stable from prior examination  Musculoskeletal Examination: Muscle strength 5/5 to b/l LE.  No pain, crepitus noted b/l. No gross pedal deformities. Utilizes cane for ambulation assistance.  Radiographs: None  Last A1c:       No data to display          Assessment:   1. PAD (peripheral artery disease) (HCC)   2. Blister of left foot, subsequent encounter    Plan:  -Blister to left dorsal foot appears resolved today - Order placed for ABI testing to establish baseline should any new wound complications arise. - Discussed close monitoring of the site and instructed patient to perform foot checks  of any new areas of concern. - Patient may keep next scheduled follow-up appointment with Dr. Eloy End for high risk footcare.  May otherwise follow-up as needed if any new pedal complaints arise.  Barbaraann Share, DPM      [place] LOCATION: [address] [place] [address]                   Office [phone]

## 2023-10-06 ENCOUNTER — Ambulatory Visit (HOSPITAL_COMMUNITY)
Admission: RE | Admit: 2023-10-06 | Discharge: 2023-10-06 | Disposition: A | Discharge status: Home / Self Care | Payer: Medicare HMO | Source: Ambulatory Visit | Attending: Podiatry | Admitting: Podiatry

## 2023-10-06 DIAGNOSIS — I739 Peripheral vascular disease, unspecified: Secondary | ICD-10-CM | POA: Diagnosis not present

## 2023-10-06 LAB — VAS US ABI WITH/WO TBI
Left ABI: 1.24
Right ABI: 1.18

## 2023-10-08 ENCOUNTER — Other Ambulatory Visit (HOSPITAL_COMMUNITY): Payer: Self-pay

## 2023-10-09 ENCOUNTER — Telehealth: Payer: Self-pay | Admitting: Pharmacy Technician

## 2023-10-09 ENCOUNTER — Other Ambulatory Visit (HOSPITAL_COMMUNITY): Payer: Self-pay

## 2023-10-09 ENCOUNTER — Other Ambulatory Visit: Payer: Self-pay

## 2023-10-09 NOTE — Progress Notes (Signed)
Specialty Pharmacy Refill Coordination Note  Aaron Gonzalez is a 77 y.o. male contacted today regarding refills of specialty medication(s) Acalabrutinib Maleate (CALQUENCE)   Patient requested Delivery   Delivery date: 10/10/23   Verified address: [address]   [place] [address]   Medication will be filled on 10/09/23.

## 2023-10-09 NOTE — Telephone Encounter (Signed)
Oral Oncology Patient Advocate Encounter   Was successful in securing patient a $ 4,500 grant from [employer] to provide copayment coverage for his Calquence.  This will keep the out of pocket expense at $0.     I have spoken with the patient.  The billing information is as follows and has been shared with [facility].   Member ID: [id number] Group ID: [id number] RxBin: [id number] Dates of Eligibility: 10/09/23 through 10/08/24  Fund:  CLL  Jinger Neighbors, CPhT-Adv Oncology Pharmacy Patient Advocate [facility] Direct Number: [phone]  Fax: [phone]

## 2023-10-09 NOTE — Telephone Encounter (Signed)
Oral Oncology Patient Advocate Encounter  Followed up on possible renewal needed for Calquence. Per the patient's insurance benefit with [facility] D, PA is not needed for renewal at this time as patient currently has access to the medication.  CMM Key: [id number]  Jinger Neighbors, CPhT-Adv Oncology Pharmacy Patient Advocate [facility] Direct Number: [phone]  Fax: [phone]

## 2023-10-09 NOTE — Progress Notes (Signed)
Specialty Pharmacy Ongoing Clinical Assessment Note  Aaron Gonzalez is a 77 y.o. male who is being followed by the specialty pharmacy service for RxSp Oncology   Patient's specialty medication(s) reviewed today: Acalabrutinib Maleate (CALQUENCE)   Missed doses in the last 4 weeks: 0   Patient/Caregiver did not have any additional questions or concerns.   Therapeutic benefit summary: Patient is achieving benefit   Adverse events/side effects summary: Experienced adverse events/side effects (patient had one episode of dizziness and tingling in his hands and feet, it resolved and he has had no other issues.)   Patient's therapy is appropriate to: Continue    Goals Addressed             This Visit's Progress    Slow Disease Progression   No change    Patient is initiating therapy. Patient will maintain adherence.         Follow up:  3 months  Servando Snare Specialty Pharmacist

## 2023-10-24 NOTE — Progress Notes (Signed)
 [facility] OFFICE PROGRESS NOTE  Aaron Leni Edyth DELENA, MD [address] [place] [address]  DIAGNOSIS: 1) History of rectal cancer in 2007 status post concurrent chemoradiation followed by surgery. 2) Chronic lymphocytic leukemia diagnosed in 2007.   PRIOR THERAPY: Ibrutinib  420 mg p.o. daily first 2007 and discontinued August 26, 2023 secondary to disease progression.   CURRENT THERAPY: Acalabrutinib  100 mg p.o. twice daily. First dose on ~08/28/23   INTERVAL HISTORY: Aaron [facility] 78 y.o. male returns to the clinic today for a follow-up visit.  The patient was last seen in clinic by Dr. Sherrod on 08/26/2023.  At that point in time, the patient had been on ibrutinib  since 2019.  He was recently found to have evidence of disease progression at his last appointment his treatment was switched to acalabrutinib .  He started this around 08/28/2023. He is compliant with this and takes this as directed twice a day. He tolerates this well without any appreciable adverse side effects.   Today, he denies any diarrhea.  Denies any unusual fatigue.  The patient maintains an active lifestyle with regular visits to the gym at least 2x per week. He has not noticed any change in his energy.  He denies any fever, chills, night sweats. He gained a few pounds back since he was last seen (he lost a few pounds at his prior appointment).  Denies any nasal congestion, sore throat, shortness of breath, skin infections, or dysuria.  Denies any abnormal bleeding or bruising.  He denies lymphadenopathy.  He is compliant with his allopurinol .  He is here today for evaluation of repeat blood work.   MEDICAL HISTORY: Past Medical History:  Diagnosis Date   Arthritis    CLL (chronic lymphocytic leukemia) (HCC)    DVT (deep venous thrombosis) (HCC)    History of rectal cancer    Hypertension    Stroke (HCC)     ALLERGIES:  has no known allergies.  MEDICATIONS:  Current Outpatient Medications   Medication Sig Dispense Refill   acalabrutinib  maleate (CALQUENCE ) 100 MG tablet Take 1 tablet (100 mg total) by mouth 2 (two) times daily. 60 tablet 2   allopurinol  (ZYLOPRIM ) 100 MG tablet Take 1 tablet (100 mg total) by mouth 2 (two) times daily. 60 tablet 2   amLODipine  (NORVASC ) 10 MG tablet Take 1 tablet by mouth daily.     blood glucose meter kit and supplies KIT Dispense based on patient and insurance preference. Use up to four times daily as directed. 1 each 0   carvedilol  (COREG ) 6.25 MG tablet TAKE 1 TABLET(6.25 MG) BY MOUTH TWICE DAILY 180 tablet 3   cyclobenzaprine  (FLEXERIL ) 10 MG tablet Take 1 tablet (10 mg total) by mouth 3 (three) times daily as needed for muscle spasms. 30 tablet 2   meloxicam  (MOBIC ) 15 MG tablet TAKE 1 TABLET(15 MG) BY MOUTH DAILY 30 tablet 2   No current facility-administered medications for this visit.    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   Procedure: Left TOTAL HIP ARTHROPLASTY ANTERIOR APPROACH;  Surgeon: Vernetta Lonni GRADE, MD;  Location: [facility];  Service: Orthopedics;  Laterality: Left;  RNFA    REVIEW OF SYSTEMS:   Constitutional: Negative for appetite change, chills, fatigue, fever and unexpected weight change.  HENT: Negative for mouth sores, nosebleeds, sore throat and trouble swallowing.   Eyes: Negative for  eye problems and icterus.  Respiratory: Negative for cough, hemoptysis, shortness of breath and wheezing.   Cardiovascular: Negative for chest pain and leg swelling.  Gastrointestinal: Negative for abdominal pain, constipation, diarrhea, nausea and vomiting.  Genitourinary: Negative for bladder incontinence, difficulty urinating, dysuria, frequency and hematuria.   Musculoskeletal: Negative for back pain, gait problem, neck pain and neck stiffness.  Skin: Negative for itching and rash.  Neurological: Negative for dizziness,  extremity weakness, gait problem, headaches, light-headedness and seizures.  Hematological: Negative for adenopathy. Does not bruise/bleed easily.  Psychiatric/Behavioral: Negative for confusion, depression and sleep disturbance. The patient is not nervous/anxious.     PHYSICAL EXAMINATION:  There were no vitals taken for this visit.  ECOG PERFORMANCE STATUS: 0  Physical Exam  Constitutional: Oriented to person, place, and time and well-developed, well-nourished, and in no distress.  HENT:  Head: Normocephalic and atraumatic.  Mouth/Throat: Oropharynx is clear and moist. No oropharyngeal exudate.  Eyes: Conjunctivae are normal. Right eye exhibits no discharge. Left eye exhibits no discharge. No scleral icterus.  Neck: Normal range of motion. Neck supple.  Cardiovascular: Normal rate, regular rhythm, normal heart sounds and intact distal pulses.   Pulmonary/Chest: Effort normal and breath sounds normal. No respiratory distress. No wheezes. No rales.  Abdominal: Soft. Bowel sounds are normal. Exhibits no distension and no mass. There is no tenderness.  Musculoskeletal: Normal range of motion. Exhibits no edema.  Lymphadenopathy:    No cervical adenopathy.  Neurological: Alert and oriented to person, place, and time. Exhibits normal muscle tone. Gait normal. Coordination normal.  Skin: Skin is warm and dry. No rash noted. Not diaphoretic. No erythema. No pallor.  Psychiatric: Mood, memory and judgment normal.  Vitals reviewed.  LABORATORY DATA: Lab Results  Component Value Date   WBC 52.6 (HH) 10/28/2023   HGB 11.5 (L) 10/28/2023   HCT 35.1 (L) 10/28/2023   MCV 95.1 10/28/2023   PLT 178 10/28/2023      Chemistry      Component Value Date/Time   NA 138 09/24/2023 1049   NA 139 06/28/2021 0954   K 3.8 09/24/2023 1049   CL 104 09/24/2023 1049   CO2 29 09/24/2023 1049   BUN 15 09/24/2023 1049   BUN 11 06/28/2021 0954   CREATININE 0.89 09/24/2023 1049      Component Value  Date/Time   CALCIUM  9.3 09/24/2023 1049   ALKPHOS 60 09/24/2023 1049   AST 16 09/24/2023 1049   ALT 10 09/24/2023 1049   BILITOT 1.1 09/24/2023 1049       RADIOGRAPHIC STUDIES:  VAS US  ABI WITH/WO TBI Result Date: 10/06/2023  LOWER EXTREMITY DOPPLER STUDY Patient Name:  BENTLEIGH STANKUS  Date of Exam:   10/06/2023 Medical Rec #: [id number]       Accession #:    [id number] Date of Birth: 1946/04/24       Patient Gender: M Patient Age:   78 years Exam Location:  [facility] Procedure:      VAS US  ABI WITH/WO TBI Referring Phys: ETHAN SADDLER --------------------------------------------------------------------------------  Indications: 12/12/2. [place] office note: -Blister to left dorsal foot appears              resolved today. Order placed for ABI testing to establish baseline              should any new wound complications arise. High Risk Factors: Hypertension, hyperlipidemia, past history of smoking.  Performing Technologist: King [place] RVT  Examination Guidelines: A complete evaluation  includes at minimum, Doppler waveform signals and systolic blood pressure reading at the level of bilateral brachial, anterior tibial, and posterior tibial arteries, when vessel segments are accessible. Bilateral testing is considered an integral part of a complete examination. Photoelectric Plethysmograph (PPG) waveforms and toe systolic pressure readings are included as required and additional duplex testing as needed. Limited examinations for reoccurring indications may be performed as noted.  ABI Findings: +---------+------------------+-----+---------+--------+ Right    Rt Pressure (mmHg)IndexWaveform Comment  +---------+------------------+-----+---------+--------+ Brachial 161                                      +---------+------------------+-----+---------+--------+ ATA      183               1.14 triphasic         +---------+------------------+-----+---------+--------+ PTA       190               1.18 triphasic         +---------+------------------+-----+---------+--------+ Great Toe130               0.81                   +---------+------------------+-----+---------+--------+ +---------+------------------+-----+---------+-------+ Left     Lt Pressure (mmHg)IndexWaveform Comment +---------+------------------+-----+---------+-------+ Brachial 151                                     +---------+------------------+-----+---------+-------+ ATA      178               1.11 triphasic        +---------+------------------+-----+---------+-------+ PTA      199               1.24 triphasic        +---------+------------------+-----+---------+-------+ Great Toe117               0.73                  +---------+------------------+-----+---------+-------+ +-------+-----------+-----------+------------+------------+ ABI/TBIToday's ABIToday's TBIPrevious ABIPrevious TBI +-------+-----------+-----------+------------+------------+ Right  1.18       0.81                                +-------+-----------+-----------+------------+------------+ Left   1.24       0.73                                +-------+-----------+-----------+------------+------------+  No previous ABI.  Summary: Right: Resting right ankle-brachial index is within normal range. The right toe-brachial index is normal. Left: Resting left ankle-brachial index is within normal range. The left toe-brachial index is normal. *See table(s) above for measurements and observations.  Electronically signed by Fonda Rim on 10/06/2023 at 6:59:18 PM.    Final      ASSESSMENT/PLAN:  This is a very pleasant 78 year old African-American male with a history of rectal cancer in 2007.  He is status post concurrent chemoradiation followed by surgical resection.  At the same time he was diagnosed with chronic lymphocytic leukemia and he has been on observation initially for several years which  was started on treatment with ibrutinib  420 mg in 2019.   He was found to have evidence of disease progression in  early November 2024.  Therefore he was started on acalabrutinib  around 08/28/2023.  He is tolerating this well without any concerning adverse side effects.   Labs were reviewed. His WBC is similar/minimally improved since his last appointment. Since starting acalabrutinib  his WBC was 256.8 (11/5)-->133.7 (11/19)-->and 54.1 (09/24/2023)-->52.6 (10/28/23). I reviewed his labs with Dr. Sherrod.  Dr. Sherrod recommends he continue on the same treatment at the same dose.    We will see him back for follow-up visit and repeat labs in 1 month.   The patient is compliant with his allopurinol .  The patient was advised to call immediately if she has any concerning symptoms in the interval. The patient voices understanding of current disease status and treatment options and is in agreement with the current care plan. All questions were answered. The patient knows to call the clinic with any problems, questions or concerns. We can certainly see the patient much sooner if necessary    Orders Placed This Encounter  Procedures   CBC with Differential ([facility] Only)    Standing Status:   Future    Expected Date:   10/29/2023    Expiration Date:   10/27/2024   CMP ([facility] only)    Standing Status:   Future    Expected Date:   10/29/2023    Expiration Date:   10/27/2024   Lactate dehydrogenase (LDH)    Standing Status:   Future    Expected Date:   10/29/2023    Expiration Date:   10/27/2024     The total time spent in the appointment was 20-29 minutes.  Mardee Clune L Keyauna Graefe, PA-C 10/28/23

## 2023-10-28 ENCOUNTER — Inpatient Hospital Stay: Payer: Medicare HMO

## 2023-10-28 ENCOUNTER — Inpatient Hospital Stay: Payer: Medicare HMO | Attending: Internal Medicine | Admitting: Physician Assistant

## 2023-10-28 DIAGNOSIS — Z923 Personal history of irradiation: Secondary | ICD-10-CM | POA: Insufficient documentation

## 2023-10-28 DIAGNOSIS — C9110 Chronic lymphocytic leukemia of B-cell type not having achieved remission: Secondary | ICD-10-CM

## 2023-10-28 DIAGNOSIS — Z85048 Personal history of other malignant neoplasm of rectum, rectosigmoid junction, and anus: Secondary | ICD-10-CM | POA: Diagnosis present

## 2023-10-28 DIAGNOSIS — Z79899 Other long term (current) drug therapy: Secondary | ICD-10-CM | POA: Insufficient documentation

## 2023-10-28 DIAGNOSIS — Z9221 Personal history of antineoplastic chemotherapy: Secondary | ICD-10-CM | POA: Diagnosis not present

## 2023-10-28 LAB — CMP (CANCER CENTER ONLY)
ALT: 15 U/L (ref 0–44)
AST: 19 U/L (ref 15–41)
Albumin: 4.3 g/dL (ref 3.5–5.0)
Alkaline Phosphatase: 58 U/L (ref 38–126)
Anion gap: 5 (ref 5–15)
BUN: 15 mg/dL (ref 8–23)
CO2: 29 mmol/L (ref 22–32)
Calcium: 9.6 mg/dL (ref 8.9–10.3)
Chloride: 105 mmol/L (ref 98–111)
Creatinine: 0.85 mg/dL (ref 0.61–1.24)
GFR, Estimated: >60 mL/min (ref >60)
Glucose, Bld: 91 mg/dL (ref 70–99)
Potassium: 4.4 mmol/L (ref 3.5–5.1)
Sodium: 139 mmol/L (ref 135–145)
Total Bilirubin: 1.1 mg/dL (ref 0.0–1.2)
Total Protein: 6.9 g/dL (ref 6.5–8.1)

## 2023-10-28 LAB — CBC WITH DIFFERENTIAL (CANCER CENTER ONLY)
Abs Immature Granulocytes: 0.09 10*3/uL — ABNORMAL HIGH (ref 0.00–0.07)
Basophils Absolute: 0.1 10*3/uL (ref 0.0–0.1)
Basophils Relative: 0 %
Eosinophils Absolute: 0.4 10*3/uL (ref 0.0–0.5)
Eosinophils Relative: 1 %
HCT: 35.1 % — ABNORMAL LOW (ref 39.0–52.0)
Hemoglobin: 11.5 g/dL — ABNORMAL LOW (ref 13.0–17.0)
Immature Granulocytes: 0 %
Lymphocytes Relative: 89 %
Lymphs Abs: 46.5 10*3/uL — ABNORMAL HIGH (ref 0.7–4.0)
MCH: 31.2 pg (ref 26.0–34.0)
MCHC: 32.8 g/dL (ref 30.0–36.0)
MCV: 95.1 fL (ref 80.0–100.0)
Monocytes Absolute: 1.7 10*3/uL — ABNORMAL HIGH (ref 0.1–1.0)
Monocytes Relative: 3 %
Neutro Abs: 3.9 10*3/uL (ref 1.7–7.7)
Neutrophils Relative %: 7 %
Platelet Count: 178 10*3/uL (ref 150–400)
RBC: 3.69 MIL/uL — ABNORMAL LOW (ref 4.22–5.81)
RDW: 20 % — ABNORMAL HIGH (ref 11.5–15.5)
WBC Count: 52.6 10*3/uL (ref 4.0–10.5)
nRBC: 0 % (ref 0.0–0.2)

## 2023-10-28 LAB — LACTATE DEHYDROGENASE: LDH: 195 U/L — ABNORMAL HIGH (ref 98–192)

## 2023-10-28 NOTE — Progress Notes (Signed)
 CRITICAL VALUE STICKER  CRITICAL VALUE: WBC 52.6  RECEIVER (on-site recipient of call): Rosina   DATE & TIME NOTIFIED: 10/28/2023 at 11:56  MESSENGER (representative from lab): Pam  MD NOTIFIED: Dr. Sherrod, [place], PA  TIME OF NOTIFICATION: 11:58  RESPONSE: Follow up with Dr. Sherrod about medication dosage

## 2023-11-03 ENCOUNTER — Other Ambulatory Visit: Payer: Self-pay

## 2023-11-07 ENCOUNTER — Other Ambulatory Visit: Payer: Self-pay

## 2023-11-17 ENCOUNTER — Other Ambulatory Visit (HOSPITAL_COMMUNITY): Payer: Self-pay

## 2023-11-17 ENCOUNTER — Other Ambulatory Visit: Payer: Self-pay

## 2023-11-17 NOTE — Progress Notes (Signed)
Specialty Pharmacy Refill Coordination Note  Aaron [facility] is a 78 y.o. male contacted today regarding refills of specialty medication(s) Acalabrutinib Maleate (CALQUENCE)   Patient requested Delivery   Delivery date: 11/19/23   Verified address: [address]   [place] [address]   Medication will be filled on 11/18/23.

## 2023-11-20 NOTE — Progress Notes (Signed)
[facility] OFFICE PROGRESS NOTE  Aaron Hack, MD [address] [place] [address]  DIAGNOSIS: 1) History of rectal cancer in 2007 status post concurrent chemoradiation followed by surgery. 2) Chronic lymphocytic leukemia diagnosed in 2007.   PRIOR THERAPY: Ibrutinib 420 mg p.o. daily first 2007 and discontinued August 26, 2023 secondary to disease progression.   CURRENT THERAPY: Acalabrutinib 100 mg p.o. twice daily. First dose on ~08/28/23   INTERVAL HISTORY: [facility] 78 y.o. male returns to the clinic today for a follow-up visit.  The patient was last seen in clinic by Dr. Arbutus Ped on 08/26/2023.  At that point in time, the patient had been on ibrutinib since 2019.  He was recently found to have evidence of disease progression and his treatment was switched to acalabrutinib.  He started this around 08/28/2023. He is compliant with this and takes this as directed twice a day. He tolerates this well without any appreciable adverse side effects.   Today, he denies any diarrhea.  Denies any unusual fatigue.  The patient maintains an active lifestyle with regular visits to the gym at least 2x per week. He has not noticed any change in his energy.  He denies any fever, chills, night sweats. He gained a few pounds back since he was last seen (he lost a few pounds at his prior appointment).  Denies any nasal congestion, sore throat, shortness of breath, skin infections, or dysuria.  Denies any abnormal bleeding or bruising.  He denies lymphadenopathy.  He is compliant with his allopurinol.  He is here today for evaluation of repeat blood work.    MEDICAL HISTORY: Past Medical History:  Diagnosis Date   Arthritis    CLL (chronic lymphocytic leukemia) (HCC)    DVT (deep venous thrombosis) (HCC)    History of rectal cancer    Hypertension    Stroke (HCC)     ALLERGIES:  has no known allergies.  MEDICATIONS:  Current Outpatient Medications  Medication Sig Dispense  Refill   acalabrutinib maleate (CALQUENCE) 100 MG tablet Take 1 tablet (100 mg total) by mouth 2 (two) times daily. 60 tablet 2   allopurinol (ZYLOPRIM) 100 MG tablet Take 1 tablet (100 mg total) by mouth 2 (two) times daily. 60 tablet 2   amLODipine (NORVASC) 10 MG tablet Take 1 tablet by mouth daily.     blood glucose meter kit and supplies KIT Dispense based on patient and insurance preference. Use up to four times daily as directed. 1 each 0   carvedilol (COREG) 6.25 MG tablet TAKE 1 TABLET(6.25 MG) BY MOUTH TWICE DAILY 180 tablet 3   cyclobenzaprine (FLEXERIL) 10 MG tablet Take 1 tablet (10 mg total) by mouth 3 (three) times daily as needed for muscle spasms. 30 tablet 2   meloxicam (MOBIC) 15 MG tablet TAKE 1 TABLET(15 MG) BY MOUTH DAILY 30 tablet 2   No current facility-administered medications for this visit.    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  HENT: Negative for mouth sores, nosebleeds, sore throat and trouble swallowing.   Eyes: Negative for eye problems  and icterus.  Respiratory: Negative for cough, hemoptysis, shortness of breath and wheezing.   Cardiovascular: Negative for chest pain and leg swelling.  Gastrointestinal: Negative for abdominal pain, constipation, diarrhea, nausea and vomiting.  Genitourinary: Negative for bladder incontinence, difficulty urinating, dysuria, frequency and hematuria.   Musculoskeletal: Negative for back pain, gait problem, neck pain and neck stiffness.  Skin: Negative for itching and rash.  Neurological: Negative for dizziness, extremity weakness, gait  problem, headaches, light-headedness and seizures.  Hematological: Negative for adenopathy. Does not bruise/bleed easily.  Psychiatric/Behavioral: Negative for confusion, depression and sleep disturbance. The patient is not nervous/anxious.    PHYSICAL EXAMINATION:  There were no vitals taken for this visit.  ECOG PERFORMANCE STATUS: 0-1  Physical Exam  Constitutional: Oriented to person, place, and time and well-developed, well-nourished, and in no distress.  HENT:  Head: Normocephalic and atraumatic.  Mouth/Throat: Oropharynx is clear and moist. No oropharyngeal exudate.  Eyes: Conjunctivae are normal. Right eye exhibits no discharge. Left eye exhibits no discharge. No scleral icterus.  Neck: Normal range of motion. Neck supple.  Cardiovascular: Normal rate, regular rhythm, normal heart sounds and intact distal pulses.   Pulmonary/Chest: Effort normal and breath sounds normal. No respiratory distress. No wheezes. No rales.  Abdominal: Soft. Bowel sounds are normal. Exhibits no distension and no mass. There is no tenderness.  Musculoskeletal: Normal range of motion. Exhibits no edema.  Lymphadenopathy:    No cervical adenopathy.  Neurological: Alert and oriented to person, place, and time. Exhibits normal muscle tone. Gait normal. Coordination normal.  Skin: Skin is warm and dry. No rash noted. Not diaphoretic. No erythema. No pallor.  Psychiatric: Mood, memory and judgment normal.  Vitals reviewed.  LABORATORY DATA: Lab Results  Component Value Date   WBC 52.6 (HH) 10/28/2023   HGB 11.5 (L) 10/28/2023   HCT 35.1 (L) 10/28/2023   MCV 95.1 10/28/2023   PLT 178 10/28/2023      Chemistry      Component Value Date/Time   NA 139 10/28/2023 1153   NA 139 06/28/2021 0954   K 4.4 10/28/2023 1153   CL 105 10/28/2023 1153   CO2 29 10/28/2023 1153   BUN 15 10/28/2023 1153   BUN 11 06/28/2021 0954   CREATININE 0.85 10/28/2023 1153      Component Value Date/Time   CALCIUM 9.6  10/28/2023 1153   ALKPHOS 58 10/28/2023 1153   AST 19 10/28/2023 1153   ALT 15 10/28/2023 1153   BILITOT 1.1 10/28/2023 1153       RADIOGRAPHIC STUDIES:  No results found.   ASSESSMENT/PLAN:  This is a very pleasant 78 year old African-American male with a history of rectal cancer in 2007.  He is status post concurrent chemoradiation followed by surgical resection.  At the same time he was diagnosed with chronic lymphocytic leukemia and he has been on observation initially for several years which was started on treatment with ibrutinib 420 mg in 2019.   He was found to have evidence of disease progression in early November 2024.  Therefore he was started on acalabrutinib around 08/28/2023.  He is tolerating this well without any concerning adverse side effects.   Labs were reviewed. His WBC is similar/minimally improved since his last appointment. Since starting acalabrutinib his WBC was 256.8 (11/5)-->133.7 (11/19)-->and 54.1 (09/24/2023)-->52.6 (10/28/23)-->71.5 (11/25/23). I reviewed his labs with Dr. Arbutus Ped to see if he would recommend any additional therapies at this time.  Dr. Arbutus Ped recommends he continue on the same treatment at the same  dose with close monitoring and repeat labs in 2-3 weeks. If continued leukocytosis, then he may consider additional therapies.    We will see him back for follow-up visit and repeat labs in 2-3 weeks   The patient is compliant with his allopurinol and his acalabrutinib   The patient was advised to call immediately if he has any concerning symptoms in the interval. The patient voices understanding of current disease status and treatment options and is in agreement with the current care plan. All questions were answered. The patient knows to call the clinic with any problems, questions or concerns. We can certainly see the patient much sooner if necessary    No orders of the defined types were placed in this encounter.     The total time spent in  the appointment was 20-29 minutes  Aaron Whiteford L Billijo Dilling, PA-C 11/20/23

## 2023-11-25 ENCOUNTER — Inpatient Hospital Stay: Payer: Medicare HMO | Admitting: Physician Assistant

## 2023-11-25 ENCOUNTER — Inpatient Hospital Stay: Payer: Medicare HMO | Attending: Internal Medicine

## 2023-11-25 VITALS — BP 134/83 | HR 70 | Temp 98.6°F | Resp 18 | Wt 163.1 lb

## 2023-11-25 DIAGNOSIS — Z85038 Personal history of other malignant neoplasm of large intestine: Secondary | ICD-10-CM | POA: Diagnosis not present

## 2023-11-25 DIAGNOSIS — Z9221 Personal history of antineoplastic chemotherapy: Secondary | ICD-10-CM | POA: Diagnosis not present

## 2023-11-25 DIAGNOSIS — C9110 Chronic lymphocytic leukemia of B-cell type not having achieved remission: Secondary | ICD-10-CM

## 2023-11-25 DIAGNOSIS — Z79899 Other long term (current) drug therapy: Secondary | ICD-10-CM | POA: Diagnosis not present

## 2023-11-25 DIAGNOSIS — Z923 Personal history of irradiation: Secondary | ICD-10-CM | POA: Insufficient documentation

## 2023-11-25 LAB — CMP (CANCER CENTER ONLY)
ALT: 8 U/L (ref 0–44)
AST: 16 U/L (ref 15–41)
Albumin: 4.2 g/dL (ref 3.5–5.0)
Alkaline Phosphatase: 55 U/L (ref 38–126)
Anion gap: 4 — ABNORMAL LOW (ref 5–15)
BUN: 16 mg/dL (ref 8–23)
CO2: 29 mmol/L (ref 22–32)
Calcium: 9.3 mg/dL (ref 8.9–10.3)
Chloride: 106 mmol/L (ref 98–111)
Creatinine: 0.97 mg/dL (ref 0.61–1.24)
GFR, Estimated: >60 mL/min (ref >60)
Glucose, Bld: 80 mg/dL (ref 70–99)
Potassium: 3.9 mmol/L (ref 3.5–5.1)
Sodium: 139 mmol/L (ref 135–145)
Total Bilirubin: 1.2 mg/dL (ref 0.0–1.2)
Total Protein: 6.5 g/dL (ref 6.5–8.1)

## 2023-11-25 LAB — CBC WITH DIFFERENTIAL (CANCER CENTER ONLY)
Abs Immature Granulocytes: 0.09 10*3/uL — ABNORMAL HIGH (ref 0.00–0.07)
Basophils Absolute: 0.1 10*3/uL (ref 0.0–0.1)
Basophils Relative: 0 %
Eosinophils Absolute: 0.4 10*3/uL (ref 0.0–0.5)
Eosinophils Relative: 1 %
HCT: 35.6 % — ABNORMAL LOW (ref 39.0–52.0)
Hemoglobin: 11.2 g/dL — ABNORMAL LOW (ref 13.0–17.0)
Immature Granulocytes: 0 %
Lymphocytes Relative: 89 %
Lymphs Abs: 64.5 10*3/uL — ABNORMAL HIGH (ref 0.7–4.0)
MCH: 29.9 pg (ref 26.0–34.0)
MCHC: 31.5 g/dL (ref 30.0–36.0)
MCV: 94.9 fL (ref 80.0–100.0)
Monocytes Absolute: 3.3 10*3/uL — ABNORMAL HIGH (ref 0.1–1.0)
Monocytes Relative: 5 %
Neutro Abs: 3.1 10*3/uL (ref 1.7–7.7)
Neutrophils Relative %: 5 %
Platelet Count: 143 10*3/uL — ABNORMAL LOW (ref 150–400)
RBC: 3.75 MIL/uL — ABNORMAL LOW (ref 4.22–5.81)
RDW: 16.5 % — ABNORMAL HIGH (ref 11.5–15.5)
Smear Review: NORMAL
WBC Count: 71.5 10*3/uL (ref 4.0–10.5)
nRBC: 0.1 % (ref 0.0–0.2)

## 2023-11-25 LAB — LACTATE DEHYDROGENASE: LDH: 243 U/L — ABNORMAL HIGH (ref 98–192)

## 2023-11-26 ENCOUNTER — Other Ambulatory Visit: Payer: Self-pay | Admitting: Internal Medicine

## 2023-12-10 ENCOUNTER — Other Ambulatory Visit: Payer: Self-pay

## 2023-12-15 ENCOUNTER — Other Ambulatory Visit: Payer: Self-pay

## 2023-12-16 ENCOUNTER — Inpatient Hospital Stay: Payer: Medicare HMO | Admitting: Internal Medicine

## 2023-12-16 ENCOUNTER — Inpatient Hospital Stay: Payer: Medicare HMO

## 2023-12-16 ENCOUNTER — Telehealth: Payer: Self-pay | Admitting: Medical Oncology

## 2023-12-16 VITALS — BP 151/76 | HR 73 | Temp 97.7°F | Resp 17 | Ht 65.0 in | Wt 166.5 lb

## 2023-12-16 DIAGNOSIS — C9110 Chronic lymphocytic leukemia of B-cell type not having achieved remission: Secondary | ICD-10-CM

## 2023-12-16 LAB — CBC WITH DIFFERENTIAL (CANCER CENTER ONLY)
Abs Immature Granulocytes: 0.08 10*3/uL — ABNORMAL HIGH (ref 0.00–0.07)
Basophils Absolute: 0.1 10*3/uL (ref 0.0–0.1)
Basophils Relative: 0 %
Eosinophils Absolute: 0.3 10*3/uL (ref 0.0–0.5)
Eosinophils Relative: 0 %
HCT: 35.7 % — ABNORMAL LOW (ref 39.0–52.0)
Hemoglobin: 11.2 g/dL — ABNORMAL LOW (ref 13.0–17.0)
Immature Granulocytes: 0 %
Lymphocytes Relative: 91 %
Lymphs Abs: 60.1 10*3/uL — ABNORMAL HIGH (ref 0.7–4.0)
MCH: 30.3 pg (ref 26.0–34.0)
MCHC: 31.4 g/dL (ref 30.0–36.0)
MCV: 96.5 fL (ref 80.0–100.0)
Monocytes Absolute: 1.7 10*3/uL — ABNORMAL HIGH (ref 0.1–1.0)
Monocytes Relative: 3 %
Neutro Abs: 3.7 10*3/uL (ref 1.7–7.7)
Neutrophils Relative %: 6 %
Platelet Count: 140 10*3/uL — ABNORMAL LOW (ref 150–400)
RBC: 3.7 MIL/uL — ABNORMAL LOW (ref 4.22–5.81)
RDW: 15.4 % (ref 11.5–15.5)
Smear Review: NORMAL
WBC Count: 64.9 10*3/uL (ref 4.0–10.5)
nRBC: 0 % (ref 0.0–0.2)

## 2023-12-16 LAB — CMP (CANCER CENTER ONLY)
ALT: 10 U/L (ref 0–44)
AST: 15 U/L (ref 15–41)
Albumin: 4.1 g/dL (ref 3.5–5.0)
Alkaline Phosphatase: 51 U/L (ref 38–126)
Anion gap: 5 (ref 5–15)
BUN: 16 mg/dL (ref 8–23)
CO2: 30 mmol/L (ref 22–32)
Calcium: 9.2 mg/dL (ref 8.9–10.3)
Chloride: 106 mmol/L (ref 98–111)
Creatinine: 0.9 mg/dL (ref 0.61–1.24)
GFR, Estimated: >60 mL/min (ref >60)
Glucose, Bld: 74 mg/dL (ref 70–99)
Potassium: 3.9 mmol/L (ref 3.5–5.1)
Sodium: 141 mmol/L (ref 135–145)
Total Bilirubin: 1.2 mg/dL (ref 0.0–1.2)
Total Protein: 6.5 g/dL (ref 6.5–8.1)

## 2023-12-16 LAB — LACTATE DEHYDROGENASE: LDH: 184 U/L (ref 98–192)

## 2023-12-16 NOTE — Progress Notes (Signed)
 [facility] [phone]   [phone]  OFFICE PROGRESS NOTE  Ellyn Hack, MD [address] [place] [address]  DIAGNOSIS:  1) History of rectal cancer in 2007 status post concurrent chemoradiation followed by surgery. 2) Chronic lymphocytic leukemia diagnosed in 2007.  PRIOR THERAPY: Ibrutinib 420 mg p.o. daily first 2007 and discontinued August 26, 2023 secondary to disease progression.  CURRENT THERAPY: Acalabrutinib 100 mg p.o. twice daily.  First dose started August 28, 2023  INTERVAL HISTORY: Aaron Gonzalez 78 y.o. male returns to the clinic today for follow-up visit. Discussed the use of AI scribe software for clinical note transcription with the patient, who gave verbal consent to proceed.  History of Present Illness   Aaron Gonzalez is a 78 year old male with chronic lymphocytic leukemia who presents for follow-up after a change in treatment regimen.  He was diagnosed with chronic lymphocytic leukemia in 2007 and initially treated with ibrutinib at a dose of 420 mg. This treatment continued until November 2024, when evidence of disease progression necessitated a change in his treatment regimen.  Since August 28, 2023, he has been on acalabrutinib, 100 mg twice a day. He is handling the new medication well without significant complaints. His white blood cell count remains elevated, which is expected when starting a new treatment, and he continues on acalabrutinib at the same dose.  No bleeding, bruising, swelling of glands, fatigue, or weakness. He experiences some night sweats but has not noticed any recent weight loss.       MEDICAL HISTORY: Past Medical History:  Diagnosis Date   Arthritis    CLL (chronic lymphocytic leukemia) (HCC)    DVT (deep venous thrombosis) (HCC)    History of rectal cancer    Hypertension    Stroke (HCC)     ALLERGIES:  has no known allergies.  MEDICATIONS:  Current Outpatient  Medications  Medication Sig Dispense Refill   acalabrutinib maleate (CALQUENCE) 100 MG tablet Take 1 tablet (100 mg total) by mouth 2 (two) times daily. 60 tablet 2   allopurinol (ZYLOPRIM) 100 MG tablet TAKE 1 TABLET(100 MG) BY MOUTH TWICE DAILY 60 tablet 2   amLODipine (NORVASC) 10 MG tablet Take 1 tablet by mouth daily.     blood glucose meter kit and supplies KIT Dispense based on patient and insurance preference. Use up to four times daily as directed. 1 each 0   carvedilol (COREG) 6.25 MG tablet TAKE 1 TABLET(6.25 MG) BY MOUTH TWICE DAILY 180 tablet 3   cyclobenzaprine (FLEXERIL) 10 MG tablet Take 1 tablet (10 mg total) by mouth 3 (three) times daily as needed for muscle spasms. 30 tablet 2   meloxicam (MOBIC) 15 MG tablet TAKE 1 TABLET(15 MG) BY MOUTH DAILY 30 tablet 2   No current facility-administered medications for this visit.    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   Procedure: Left TOTAL HIP ARTHROPLASTY ANTERIOR APPROACH;  Surgeon: Kathryne Hitch, MD;  Location: [facility];  Service: Orthopedics;  Laterality: Left;  RNFA    REVIEW OF SYSTEMS:  A comprehensive review of systems was negative except for: Constitutional: positive for fatigue   PHYSICAL EXAMINATION: General appearance: alert, cooperative, fatigued, and no distress Head: Normocephalic, without obvious abnormality, atraumatic Neck: no adenopathy, no JVD, supple, symmetrical, trachea midline, and thyroid not enlarged, symmetric,  no tenderness/mass/nodules Lymph nodes: Cervical, supraclavicular, and axillary nodes normal. Resp: clear to auscultation bilaterally Back: symmetric, no curvature. ROM normal. No CVA tenderness. Cardio: regular rate and rhythm, S1, S2 normal, no murmur, click, rub or gallop GI: soft, non-tender; bowel sounds normal; no masses,  no organomegaly Extremities: extremities normal,  atraumatic, no cyanosis or edema  ECOG PERFORMANCE STATUS: 1 - Symptomatic but completely ambulatory  Blood pressure (!) 151/76, pulse 73, temperature 97.7 F (36.5 C), temperature source Temporal, resp. rate 17, height 5\' 5"  (1.651 m), weight 166 lb 8 oz (75.5 kg), SpO2 98%.  LABORATORY DATA: Lab Results  Component Value Date   WBC 64.9 (HH) 12/16/2023   HGB 11.2 (L) 12/16/2023   HCT 35.7 (L) 12/16/2023   MCV 96.5 12/16/2023   PLT 140 (L) 12/16/2023      Chemistry      Component Value Date/Time   NA 141 12/16/2023 1127   NA 139 06/28/2021 0954   K 3.9 12/16/2023 1127   CL 106 12/16/2023 1127   CO2 30 12/16/2023 1127   BUN 16 12/16/2023 1127   BUN 11 06/28/2021 0954   CREATININE 0.90 12/16/2023 1127      Component Value Date/Time   CALCIUM 9.2 12/16/2023 1127   ALKPHOS 51 12/16/2023 1127   AST 15 12/16/2023 1127   ALT 10 12/16/2023 1127   BILITOT 1.2 12/16/2023 1127       RADIOGRAPHIC STUDIES: No results found.  ASSESSMENT AND PLAN: This is a very pleasant 78 years old African-American male with history of rectal cancer diagnosed in 2007 status post concurrent chemoradiation followed by surgical resection.  At the same time the patient was diagnosed with chronic lymphocytic leukemia and has been in observation initially for several years but started on treatment with ibrutinib 420 mg in 2019.  He tolerated his treatment well until November 2024 when he had evidence for disease progression.  We switched his treatment to acalabrutinib 100 mg p.o. twice daily started August 28, 2023 and he has been tolerating it fairly well.  Chronic Lymphocytic Leukemia (CLL) CLL diagnosed in 2007. Initially treated with ibrutinib 420 mg until November 2024, then switched to acalabrutinib 100 mg twice daily due to disease progression. Currently, he reports no bleeding, bruising, weight loss, or lymphadenopathy. Experiences night sweats but no fatigue or weakness. Elevated WBC is expected  with new treatment. Plan includes monitoring for disease progression with imaging and blood work. Discussed need for CT scan of chest, abdomen, and pelvis to check for new lymph nodes or organ enlargement. Advised to contact scheduling if not reached within a week for the scan appointment. - Continue acalabrutinib 100 mg twice daily - Order CT scan of chest, abdomen, and pelvis one week before next visit - Schedule follow-up appointment in one month with repeat blood work.   The patient was advised to call immediately if he has any other concerning symptoms in the interval. The patient voices understanding of current disease status and treatment options and is in agreement with the current care plan. All questions were answered. The patient knows to call the clinic with any problems, questions or concerns. We can certainly see the patient much sooner if necessary.  Disclaimer: This note was dictated with voice recognition software. Similar sounding words can inadvertently be transcribed and may not be corrected upon review.

## 2023-12-16 NOTE — Telephone Encounter (Signed)
  CRITICAL VALUE STICKER  CRITICAL VALUE: WBC=64.9k  RECEIVER (on-site recipient of call): Vincent Peyer, RN  DATE & TIME NOTIFIED: 12/16/2023 @ 1139  MESSENGER (representative from lab):Elizabeth  MD NOTIFIED: Si Gaul, MD  TIME OF NOTIFICATION: 1151  RESPONSE:  [name] is here to see Dr. Arbutus Ped  today in the office  WBC WBC =64.9k

## 2023-12-24 ENCOUNTER — Other Ambulatory Visit: Payer: Self-pay | Admitting: Pharmacy Technician

## 2023-12-24 ENCOUNTER — Other Ambulatory Visit: Payer: Self-pay

## 2023-12-24 ENCOUNTER — Other Ambulatory Visit: Payer: Self-pay | Admitting: Internal Medicine

## 2023-12-24 ENCOUNTER — Other Ambulatory Visit (HOSPITAL_COMMUNITY): Payer: Self-pay

## 2023-12-24 DIAGNOSIS — C9110 Chronic lymphocytic leukemia of B-cell type not having achieved remission: Secondary | ICD-10-CM

## 2023-12-24 MED ORDER — CALQUENCE 100 MG PO TABS
100.0000 mg | ORAL_TABLET | Freq: Two times a day (BID) | ORAL | 2 refills | Status: DC
  Filled 2023-12-25: qty 60, 30d supply, fill #0
  Filled 2024-01-29: qty 60, 30d supply, fill #1
  Filled 2024-02-25: qty 60, 30d supply, fill #2

## 2023-12-24 NOTE — Progress Notes (Signed)
 Specialty Pharmacy Refill Coordination Note  Aaron Gonzalez is a 78 y.o. male contacted today regarding refills of specialty medication(s) Acalabrutinib Maleate (CALQUENCE)   Patient requested Delivery   Delivery date: 12/26/23   Verified address: Patient address [address]  [place]   Medication will be filled on 12/25/23.  RR sent to MD

## 2023-12-25 ENCOUNTER — Other Ambulatory Visit (HOSPITAL_COMMUNITY): Payer: Self-pay

## 2023-12-25 ENCOUNTER — Other Ambulatory Visit: Payer: Self-pay

## 2024-01-06 ENCOUNTER — Ambulatory Visit (HOSPITAL_COMMUNITY)
Admission: RE | Admit: 2024-01-06 | Discharge: 2024-01-06 | Disposition: A | Discharge status: Home / Self Care | Payer: Medicare HMO | Source: Ambulatory Visit | Attending: Internal Medicine | Admitting: Internal Medicine

## 2024-01-06 DIAGNOSIS — C9110 Chronic lymphocytic leukemia of B-cell type not having achieved remission: Secondary | ICD-10-CM | POA: Insufficient documentation

## 2024-01-06 MED ORDER — SODIUM CHLORIDE (PF) 0.9 % IJ SOLN
INTRAMUSCULAR | Status: AC
Start: 2024-01-06 — End: ?
  Filled 2024-01-06: qty 50

## 2024-01-06 MED ORDER — IOHEXOL 300 MG/ML  SOLN: 30.0000 mL | Freq: Once | INTRAMUSCULAR | Status: DC | PRN

## 2024-01-06 MED ORDER — IOHEXOL 300 MG/ML  SOLN
80.0000 mL | Freq: Once | INTRAMUSCULAR | Status: AC | PRN
  Administered 2024-01-06: 80 mL via INTRAVENOUS

## 2024-01-07 NOTE — Progress Notes (Signed)
 [facility] OFFICE PROGRESS NOTE  Ellyn Hack, MD [address] [place] [address]  DIAGNOSIS: 1) History of rectal cancer in 2007 status post concurrent chemoradiation followed by surgery. 2) Chronic lymphocytic leukemia diagnosed in 2007.   PRIOR THERAPY:  Ibrutinib 420 mg p.o. daily first 2007 and discontinued August 26, 2023 secondary to disease progression.   CURRENT THERAPY: Acalabrutinib 100 mg p.o. twice daily. First dose on ~08/28/23   INTERVAL HISTORY: Aaron Gonzalez 78 y.o. male returns to the clinic today for a follow-up visit.  The patient was last seen in clinic by Dr. Arbutus Ped on 12/16/23. the patient had been on ibrutinib since 2019.  He was recently found to have evidence of disease progression and his treatment was switched to acalabrutinib.  He started this around 08/28/2023. He is compliant with this and takes this as directed twice a day. He tolerates this well without any appreciable adverse side effects. He had initial improvement in his WBC but his WBC continues have leveled off but is still elevated. Therefore, Dr. Arbutus Ped recommended CT of the CAP to assess for disease progression  Today, he denies any changes in his health. He denies diarrhea. Denies any unusual fatigue. The patient maintains an active lifestyle with regular visits to the gym at least 2x per week. He has not noticed any change in his energy. He denies any fever, chills, night sweats. He lost a few pounds back since he was last seen but states his appetite is "the usual". He eats 3 meals per day [facility] the [facility] center [facility]. Denies any nasal congestion, sore throat, shortness of breath, skin infections, or dysuria. Denies any abnormal bleeding or bruising. He denies lymphadenopathy. He is compliant with his allopurinol. His baseline swelling is stable. He is here today for evaluation of repeat blood work and to review his scan results.    MEDICAL HISTORY: Past Medical  History:  Diagnosis Date   Arthritis    CLL (chronic lymphocytic leukemia) (HCC)    DVT (deep venous thrombosis) (HCC)    History of rectal cancer    Hypertension    Stroke (HCC)     ALLERGIES:  has no known allergies.  MEDICATIONS:  Current Outpatient Medications  Medication Sig Dispense Refill   acalabrutinib maleate (CALQUENCE) 100 MG tablet Take 1 tablet (100 mg total) by mouth 2 (two) times daily. 60 tablet 2   allopurinol (ZYLOPRIM) 100 MG tablet TAKE 1 TABLET(100 MG) BY MOUTH TWICE DAILY 60 tablet 2   amLODipine (NORVASC) 10 MG tablet Take 1 tablet by mouth daily.     blood glucose meter kit and supplies KIT Dispense based on patient and insurance preference. Use up to four times daily as directed. 1 each 0   carvedilol (COREG) 6.25 MG tablet TAKE 1 TABLET(6.25 MG) BY MOUTH TWICE DAILY 180 tablet 3   cyclobenzaprine (FLEXERIL) 10 MG tablet Take 1 tablet (10 mg total) by mouth 3 (three) times daily as needed for muscle spasms. 30 tablet 2   meloxicam (MOBIC) 15 MG tablet TAKE 1 TABLET(15 MG) BY MOUTH DAILY 30 tablet 2   No current facility-administered medications for this visit.    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   Procedure: Left TOTAL HIP ARTHROPLASTY ANTERIOR APPROACH;  Surgeon: Kathryne Hitch, MD;  Location: [facility];  Service: Orthopedics;  Laterality: Left;  RNFA    REVIEW OF SYSTEMS:   Review of Systems  Constitutional: Negative for appetite change, chills, fatigue, fever and unexpected weight change.  HENT:   Negative for mouth sores, nosebleeds, sore throat and trouble swallowing.   Eyes: Negative for eye problems and icterus.  Respiratory: Negative for cough, hemoptysis, shortness of breath and wheezing.   Cardiovascular: Negative for chest pain. Stable bilateral leg swelling. Gastrointestinal: Negative for abdominal pain, constipation, diarrhea,  nausea and vomiting.  Genitourinary: Negative for bladder incontinence, difficulty urinating, dysuria, frequency and hematuria.   Musculoskeletal: Negative for back pain, gait problem, neck pain and neck stiffness.  Skin: Negative for itching and rash.  Neurological: Negative for dizziness, extremity weakness, gait problem, headaches, light-headedness and seizures.  Hematological: Negative for adenopathy. Does not bruise/bleed easily.  Psychiatric/Behavioral: Negative for confusion, depression and sleep disturbance. The patient is not nervous/anxious.     PHYSICAL EXAMINATION:  There were no vitals taken for this visit.  ECOG PERFORMANCE STATUS: 1  Physical Exam  Constitutional: Oriented to person, place, and time and well-developed, well-nourished, and in no distress.  HENT:  Head: Normocephalic and atraumatic.  Mouth/Throat: Oropharynx is clear and moist. No oropharyngeal exudate.  Eyes: Conjunctivae are normal. Right eye exhibits no discharge. Left eye exhibits no discharge. No scleral icterus.  Neck: Normal range of motion. Neck supple.  Cardiovascular: Normal rate, regular rhythm, normal heart sounds and intact distal pulses.   Pulmonary/Chest: Effort normal and breath sounds normal. No respiratory distress. No wheezes. No rales.  Abdominal: Soft. Bowel sounds are normal. Exhibits no distension and no mass. There is no tenderness.  Musculoskeletal: Normal range of motion. Stable bilateral lower extremity swelling.  Lymphadenopathy:    No cervical adenopathy.  Neurological: Alert and oriented to person, place, and time. Exhibits normal muscle tone. Gait normal. Coordination normal.  Skin: Skin is warm and dry. No rash noted. Not diaphoretic. No erythema. No pallor.  Psychiatric: Mood, memory and judgment normal.  Vitals reviewed.  LABORATORY DATA: Lab Results  Component Value Date   WBC 64.9 (HH) 12/16/2023   HGB 11.2 (L) 12/16/2023   HCT 35.7 (L) 12/16/2023   MCV 96.5  12/16/2023   PLT 140 (L) 12/16/2023      Chemistry      Component Value Date/Time   NA 141 12/16/2023 1127   NA 139 06/28/2021 0954   K 3.9 12/16/2023 1127   CL 106 12/16/2023 1127   CO2 30 12/16/2023 1127   BUN 16 12/16/2023 1127   BUN 11 06/28/2021 0954   CREATININE 0.90 12/16/2023 1127      Component Value Date/Time   CALCIUM 9.2 12/16/2023 1127   ALKPHOS 51 12/16/2023 1127   AST 15 12/16/2023 1127   ALT 10 12/16/2023 1127   BILITOT 1.2 12/16/2023 1127       RADIOGRAPHIC STUDIES:  No results found.   ASSESSMENT/PLAN:  This is a very pleasant 78 year old African-American male with a history of rectal cancer in 2007.  He is status post concurrent chemoradiation followed by surgical resection.  At the same time he was diagnosed with chronic lymphocytic leukemia and he has been on observation initially for several years which was started on treatment with ibrutinib 420 mg in 2019.   He was found to have evidence of disease progression in early November 2024.  Therefore he was started on acalabrutinib around 08/28/2023.  He is tolerating this well without any concerning adverse side effects.  The patient was seen with Dr. Arbutus Ped today.  Dr. Arbutus Ped personally and independently reviewed the scan and discussed results with the patient today.  The scan showed no evidence of lymphadenopathy.  Dr. Arbutus Ped recommends he continues on the same treatment at the same dose    Labs were reviewed. His WBC is still reevaluated but is improved compared to his last appointment. Since starting acalabrutinib his WBC was 256.8 (11/5)-->133.7 (11/19)-->and 54.1 (09/24/2023)-->52.6 (10/28/23)-->71.5 (11/25/23)-->64.9 (2/25/)-->42.9 today.  Dr. Arbutus Ped recommends he continue on the same treatment at the same dose with close monitoring and repeat labs in 1 month.   We will see him back for follow-up visit and repeat labs in 1 month weeks.   The patient is compliant with his allopurinol and his  acalabrutinib   The patient was advised to call immediately if he has any concerning symptoms in the interval. The patient voices understanding of current disease status and treatment options and is in agreement with the current care plan. All questions were answered. The patient knows to call the clinic with any problems, questions or concerns. We can certainly see the patient much sooner if necessary  No orders of the defined types were placed in this encounter.     Azula Zappia L Ghali Morissette, PA-C 01/07/24  ADDENDUM: Hematology/Oncology Attending: I had a face-to-face encounter with the patient today.  I reviewed his record, lab, scan and recommended his care plan.  This is a very pleasant 78 years old African-American male with history of chronic lymphocytic leukemia diagnosed in 2007 in addition to history of rectal cancer status post concurrent chemoradiation followed by surgery at that time.  The patient has been on treatment with ibrutinib between 2007 until November 2024 when he had evidence for disease progression.  We switched his treatment to acalabrutinib 100 mg p.o. twice daily started August 28, 2023 and he has been tolerating this treatment fairly well.  He had repeat blood work as well as CT scan of the chest, abdomen and pelvis performed recently.  I personally and independently reviewed his lab and scan and discussed the result with the patient today.  His lab work showed improvement of his total white blood count and CT scan of the chest, abdomen and pelvis showed no concerning findings for disease progression or progressive lymphadenopathy. I recommended for the patient to continue his current treatment with acalabrutinib with the same dose. I will see him back for follow-up visit in 6 weeks for reevaluation and repeat blood work. He was advised to call immediately if he has any other concerning symptoms in the interval. The total time spent in the appointment was 30  minutes. Disclaimer: This note was dictated with voice recognition software. Similar sounding words can inadvertently be transcribed and may be missed upon review. Lajuana Matte, MD

## 2024-01-13 ENCOUNTER — Inpatient Hospital Stay (HOSPITAL_BASED_OUTPATIENT_CLINIC_OR_DEPARTMENT_OTHER): Payer: Medicare HMO | Admitting: Physician Assistant

## 2024-01-13 ENCOUNTER — Inpatient Hospital Stay: Payer: Medicare HMO | Attending: Internal Medicine

## 2024-01-13 VITALS — BP 134/87 | HR 70 | Temp 98.0°F | Resp 14 | Wt 163.9 lb

## 2024-01-13 DIAGNOSIS — Z79899 Other long term (current) drug therapy: Secondary | ICD-10-CM | POA: Insufficient documentation

## 2024-01-13 DIAGNOSIS — Z9221 Personal history of antineoplastic chemotherapy: Secondary | ICD-10-CM | POA: Insufficient documentation

## 2024-01-13 DIAGNOSIS — Z923 Personal history of irradiation: Secondary | ICD-10-CM | POA: Diagnosis not present

## 2024-01-13 DIAGNOSIS — C9110 Chronic lymphocytic leukemia of B-cell type not having achieved remission: Secondary | ICD-10-CM

## 2024-01-13 DIAGNOSIS — Z85048 Personal history of other malignant neoplasm of rectum, rectosigmoid junction, and anus: Secondary | ICD-10-CM | POA: Diagnosis not present

## 2024-01-13 LAB — CMP (CANCER CENTER ONLY)
ALT: 10 U/L (ref 0–44)
AST: 17 U/L (ref 15–41)
Albumin: 4.3 g/dL (ref 3.5–5.0)
Alkaline Phosphatase: 51 U/L (ref 38–126)
Anion gap: 4 — ABNORMAL LOW (ref 5–15)
BUN: 14 mg/dL (ref 8–23)
CO2: 29 mmol/L (ref 22–32)
Calcium: 9.2 mg/dL (ref 8.9–10.3)
Chloride: 108 mmol/L (ref 98–111)
Creatinine: 0.96 mg/dL (ref 0.61–1.24)
GFR, Estimated: >60 mL/min (ref >60)
Glucose, Bld: 84 mg/dL (ref 70–99)
Potassium: 4.1 mmol/L (ref 3.5–5.1)
Sodium: 141 mmol/L (ref 135–145)
Total Bilirubin: 1.1 mg/dL (ref 0.0–1.2)
Total Protein: 6.9 g/dL (ref 6.5–8.1)

## 2024-01-13 LAB — CBC WITH DIFFERENTIAL (CANCER CENTER ONLY)
Abs Immature Granulocytes: 0.07 10*3/uL (ref 0.00–0.07)
Basophils Absolute: 0.2 10*3/uL — ABNORMAL HIGH (ref 0.0–0.1)
Basophils Relative: 0 %
Eosinophils Absolute: 0.3 10*3/uL (ref 0.0–0.5)
Eosinophils Relative: 1 %
HCT: 38.2 % — ABNORMAL LOW (ref 39.0–52.0)
Hemoglobin: 12.1 g/dL — ABNORMAL LOW (ref 13.0–17.0)
Immature Granulocytes: 0 %
Lymphocytes Relative: 88 %
Lymphs Abs: 37.5 10*3/uL — ABNORMAL HIGH (ref 0.7–4.0)
MCH: 30.9 pg (ref 26.0–34.0)
MCHC: 31.7 g/dL (ref 30.0–36.0)
MCV: 97.7 fL (ref 80.0–100.0)
Monocytes Absolute: 1.4 10*3/uL — ABNORMAL HIGH (ref 0.1–1.0)
Monocytes Relative: 3 %
Neutro Abs: 3.5 10*3/uL (ref 1.7–7.7)
Neutrophils Relative %: 8 %
Platelet Count: 157 10*3/uL (ref 150–400)
RBC: 3.91 MIL/uL — ABNORMAL LOW (ref 4.22–5.81)
RDW: 15.6 % — ABNORMAL HIGH (ref 11.5–15.5)
WBC Count: 42.9 10*3/uL — ABNORMAL HIGH (ref 4.0–10.5)
nRBC: 0 % (ref 0.0–0.2)

## 2024-01-13 LAB — LACTATE DEHYDROGENASE: LDH: 240 U/L — ABNORMAL HIGH (ref 98–192)

## 2024-01-13 LAB — URIC ACID: Uric Acid, Serum: 4.2 mg/dL (ref 3.7–8.6)

## 2024-01-15 ENCOUNTER — Other Ambulatory Visit (HOSPITAL_COMMUNITY): Payer: Self-pay

## 2024-01-20 ENCOUNTER — Ambulatory Visit: Payer: Medicare HMO | Admitting: Podiatry

## 2024-01-20 ENCOUNTER — Encounter: Payer: Self-pay | Admitting: Podiatry

## 2024-01-20 VITALS — Ht 65.0 in | Wt 163.9 lb

## 2024-01-20 DIAGNOSIS — M79675 Pain in left toe(s): Secondary | ICD-10-CM | POA: Diagnosis not present

## 2024-01-20 DIAGNOSIS — M79674 Pain in right toe(s): Secondary | ICD-10-CM | POA: Diagnosis not present

## 2024-01-20 DIAGNOSIS — B351 Tinea unguium: Secondary | ICD-10-CM | POA: Diagnosis not present

## 2024-01-20 DIAGNOSIS — I739 Peripheral vascular disease, unspecified: Secondary | ICD-10-CM | POA: Diagnosis not present

## 2024-01-22 NOTE — Progress Notes (Signed)
  Subjective:  Patient ID: [name], male    DOB: 30-May-1946,  MRN: [id number]  Aaron Gonzalez presents to clinic today for at risk foot care. Patient has h/o PAD and painful, elongated thickened toenails x 10 which are symptomatic when wearing enclosed shoe gear. This interferes with his/her daily activities.  Chief Complaint  Patient presents with   RFC    He is here for a  nail trim. PCP is dr Sherryll Burger and seen 2 months ago.    New problem(s): None.   PCP is Ellyn Hack, MD.  No Known Allergies  Review of Systems: Negative except as noted in the HPI.  Objective: No changes noted in today's physical examination. There were no vitals filed for this visit. Aaron Gonzalez is a pleasant 78 y.o. male WD, WN in NAD. AAO x 3.  Vascular Examination: Capillary refill time immediate b/l. Vascular status intact b/l with palpable DP pulses; nonpalpable PT pulses. Pedal hair absent b/l. No edema. No pain with calf compression b/l. Skin temperature gradient WNL b/l. No cyanosis or clubbing noted b/l. No ischemia or gangrene b/l.   Neurological Examination: Sensation grossly intact b/l with 10 gram monofilament. Vibratory sensation intact b/l.   Dermatological Examination: Pedal skin with normal turgor, texture and tone b/l.  No open wounds. No interdigital macerations.   Toenails 1-5 b/l thick, discolored, elongated with subungual debris and pain on dorsal palpation.   Raised benign papule noted anterior aspect left ankle. Annular with no asymmetry, normal border, normal color.  Pedal skin with normal turgor, texture and tone b/l. No interdigital macerations noted. Toenails 1-5 b/l thick, discolored, elongated with subungual debris and pain on dorsal palpation.   No corns, calluses nor porokeratotic lesions noted.  Musculoskeletal Examination: Muscle strength 5/5 to all lower extremity muscle groups bilaterally. No pain, crepitus or joint limitation noted with ROM bilateral LE. Pes  planus deformity noted bilateral LE.  Radiographs: None  Assessment/Plan: 1. Pain due to onychomycosis of toenails of both feet   2. PAD (peripheral artery disease) (HCC)   Patient was evaluated and treated. All patient's and/or POA's questions/concerns addressed on today's visit. Mycotic toenails 1-5 debrided in length and girth without incident. Continue soft, supportive shoe gear daily. Report any pedal injuries to medical professional. Call office if there are any quesitons/concerns. -Patient/POA to call should there be question/concern in the interim.   Return in about 3 months (around 04/20/2024).  [name], DPM      [place] LOCATION: [address], [place] [address]                   Office [phone]   [facility] LOCATION: [address] [place], [place] [address] Office [phone]

## 2024-01-23 ENCOUNTER — Other Ambulatory Visit: Payer: Self-pay

## 2024-01-29 ENCOUNTER — Other Ambulatory Visit (HOSPITAL_COMMUNITY): Payer: Self-pay

## 2024-01-29 ENCOUNTER — Other Ambulatory Visit: Payer: Self-pay

## 2024-01-29 ENCOUNTER — Other Ambulatory Visit: Payer: Self-pay | Admitting: Pharmacy Technician

## 2024-01-29 NOTE — Progress Notes (Signed)
 Specialty Pharmacy Ongoing Clinical Assessment Note  ELEAZAR KIMMEY is a 78 y.o. male who is being followed by the specialty pharmacy service for RxSp Oncology   Patient's specialty medication(s) reviewed today: Acalabrutinib Maleate (Calquence)   Missed doses in the last 4 weeks: 0   Patient/Caregiver did not have any additional questions or concerns.   Therapeutic benefit summary: Patient is achieving benefit   Adverse events/side effects summary: No adverse events/side effects   Patient's therapy is appropriate to: Continue    Goals Addressed             This Visit's Progress    Slow Disease Progression   On track    Patient is initiating therapy. Patient will maintain adherence.  Per Office visit note from 01/13/24, the recent scan showed no evidence of lymphadenopathy and his WBC is not yet at goal, but is improving.         Follow up:  3 months  Servando Snare Specialty Pharmacist

## 2024-01-29 NOTE — Progress Notes (Addendum)
 Specialty Pharmacy Refill Coordination Note  Aaron Gonzalez is a 78 y.o. male contacted today regarding refills of specialty medication(s) Acalabrutinib Maleate (Calquence)   Patient requested Delivery   Delivery date: 01/30/2024   Verified address: Patient address [address]  [place] [address]   Medication will be filled on 01/29/24.

## 2024-02-18 ENCOUNTER — Other Ambulatory Visit (HOSPITAL_COMMUNITY): Payer: Self-pay

## 2024-02-23 ENCOUNTER — Other Ambulatory Visit (HOSPITAL_COMMUNITY): Payer: Self-pay

## 2024-02-24 ENCOUNTER — Inpatient Hospital Stay (HOSPITAL_BASED_OUTPATIENT_CLINIC_OR_DEPARTMENT_OTHER): Admitting: Internal Medicine

## 2024-02-24 ENCOUNTER — Inpatient Hospital Stay: Attending: Internal Medicine

## 2024-02-24 VITALS — BP 144/82 | HR 61 | Temp 97.9°F | Resp 17 | Ht 65.0 in | Wt 160.0 lb

## 2024-02-24 DIAGNOSIS — Z9049 Acquired absence of other specified parts of digestive tract: Secondary | ICD-10-CM | POA: Insufficient documentation

## 2024-02-24 DIAGNOSIS — C9110 Chronic lymphocytic leukemia of B-cell type not having achieved remission: Secondary | ICD-10-CM | POA: Diagnosis not present

## 2024-02-24 DIAGNOSIS — Z923 Personal history of irradiation: Secondary | ICD-10-CM | POA: Diagnosis not present

## 2024-02-24 DIAGNOSIS — Z85048 Personal history of other malignant neoplasm of rectum, rectosigmoid junction, and anus: Secondary | ICD-10-CM | POA: Insufficient documentation

## 2024-02-24 LAB — CMP (CANCER CENTER ONLY)
ALT: 14 U/L (ref 0–44)
AST: 18 U/L (ref 15–41)
Albumin: 4.7 g/dL (ref 3.5–5.0)
Alkaline Phosphatase: 53 U/L (ref 38–126)
Anion gap: 7 (ref 5–15)
BUN: 14 mg/dL (ref 8–23)
CO2: 29 mmol/L (ref 22–32)
Calcium: 9.3 mg/dL (ref 8.9–10.3)
Chloride: 104 mmol/L (ref 98–111)
Creatinine: 0.92 mg/dL (ref 0.61–1.24)
GFR, Estimated: >60 mL/min (ref >60)
Glucose, Bld: 59 mg/dL — ABNORMAL LOW (ref 70–99)
Potassium: 3.6 mmol/L (ref 3.5–5.1)
Sodium: 140 mmol/L (ref 135–145)
Total Bilirubin: 2 mg/dL — ABNORMAL HIGH (ref 0.0–1.2)
Total Protein: 7.4 g/dL (ref 6.5–8.1)

## 2024-02-24 LAB — CBC WITH DIFFERENTIAL (CANCER CENTER ONLY)
Abs Immature Granulocytes: 0.06 10*3/uL (ref 0.00–0.07)
Basophils Absolute: 0.2 10*3/uL — ABNORMAL HIGH (ref 0.0–0.1)
Basophils Relative: 0 %
Eosinophils Absolute: 0.2 10*3/uL (ref 0.0–0.5)
Eosinophils Relative: 1 %
HCT: 42.1 % (ref 39.0–52.0)
Hemoglobin: 13.5 g/dL (ref 13.0–17.0)
Immature Granulocytes: 0 %
Lymphocytes Relative: 83 %
Lymphs Abs: 27.7 10*3/uL — ABNORMAL HIGH (ref 0.7–4.0)
MCH: 30.7 pg (ref 26.0–34.0)
MCHC: 32.1 g/dL (ref 30.0–36.0)
MCV: 95.7 fL (ref 80.0–100.0)
Monocytes Absolute: 2.4 10*3/uL — ABNORMAL HIGH (ref 0.1–1.0)
Monocytes Relative: 7 %
Neutro Abs: 3.1 10*3/uL (ref 1.7–7.7)
Neutrophils Relative %: 9 %
Platelet Count: 154 10*3/uL (ref 150–400)
RBC: 4.4 MIL/uL (ref 4.22–5.81)
RDW: 15.6 % — ABNORMAL HIGH (ref 11.5–15.5)
Smear Review: NORMAL
WBC Count: 33.5 10*3/uL — ABNORMAL HIGH (ref 4.0–10.5)
nRBC: 0 % (ref 0.0–0.2)

## 2024-02-24 LAB — URIC ACID: Uric Acid, Serum: 4.3 mg/dL (ref 3.7–8.6)

## 2024-02-24 LAB — LACTATE DEHYDROGENASE: LDH: 273 U/L — ABNORMAL HIGH (ref 98–192)

## 2024-02-24 NOTE — Progress Notes (Signed)
 [facility] [phone]   [phone]  OFFICE PROGRESS NOTE  Ulysees Gander, MD [address] [place] [address]  DIAGNOSIS:  1) History of rectal cancer in 2007 status post concurrent chemoradiation followed by surgery. 2) Chronic lymphocytic leukemia diagnosed in 2007.  PRIOR THERAPY: Ibrutinib  420 mg p.o. daily first 2007 and discontinued August 26, 2023 secondary to disease progression.  CURRENT THERAPY: Acalabrutinib  100 mg p.o. twice daily.  First dose started August 28, 2023  INTERVAL HISTORY: NYZAIAH BAYLIS 78 y.o. male returns to the clinic today for follow-up visit. Discussed the use of AI scribe software for clinical note transcription with the patient, who gave verbal consent to proceed.  History of Present Illness   CEPEDA FRANQUI is a 78 year old male with chronic lymphocytic leukemia who presents for evaluation and repeat blood work.  He has a history of chronic lymphocytic leukemia, diagnosed in 2007. He was previously treated with ibrutinib  until November 2024, when it was discontinued due to disease progression. Since November 2024, he has been on acalabrutinib , 100 mg twice daily.  He reports no complaints since his last visit and is handling acalabrutinib  well with no issues. No nausea, vomiting, diarrhea, recent weight loss, night sweats, chest pain, shortness of breath, cough, or bleeding.  Recent blood work shows a white blood count of 33.5 thousand, hemoglobin of 13.5, hematocrit of 42.1, and platelets of 154. Uric acid level is 4.3. His blood sugar was noted to be low at 59, although he did not come fasting.       MEDICAL HISTORY: Past Medical History:  Diagnosis Date   Arthritis    CLL (chronic lymphocytic leukemia) (HCC)    DVT (deep venous thrombosis) (HCC)    History of rectal cancer    Hypertension    Stroke (HCC)     ALLERGIES:  has no known allergies.  MEDICATIONS:  Current Outpatient  Medications  Medication Sig Dispense Refill   acalabrutinib  maleate (CALQUENCE ) 100 MG tablet Take 1 tablet (100 mg total) by mouth 2 (two) times daily. 60 tablet 2   allopurinol  (ZYLOPRIM ) 100 MG tablet TAKE 1 TABLET(100 MG) BY MOUTH TWICE DAILY 60 tablet 2   amLODipine  (NORVASC ) 10 MG tablet Take 1 tablet by mouth daily.     blood glucose meter kit and supplies KIT Dispense based on patient and insurance preference. Use up to four times daily as directed. 1 each 0   carvedilol  (COREG ) 6.25 MG tablet TAKE 1 TABLET(6.25 MG) BY MOUTH TWICE DAILY 180 tablet 3   cyclobenzaprine  (FLEXERIL ) 10 MG tablet Take 1 tablet (10 mg total) by mouth 3 (three) times daily as needed for muscle spasms. 30 tablet 2   meloxicam  (MOBIC ) 15 MG tablet TAKE 1 TABLET(15 MG) BY MOUTH DAILY 30 tablet 2   No current facility-administered medications for this visit.    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   Procedure: Left TOTAL HIP ARTHROPLASTY ANTERIOR APPROACH;  Surgeon: Arnie Lao, MD;  Location: [facility];  Service: Orthopedics;  Laterality: Left;  RNFA    REVIEW OF SYSTEMS:  A comprehensive review of systems was negative.   PHYSICAL EXAMINATION: General appearance: alert, cooperative, and no distress Head: Normocephalic, without obvious abnormality, atraumatic Neck: no adenopathy, no JVD, supple, symmetrical, trachea midline, and thyroid not enlarged, symmetric, no tenderness/mass/nodules  Lymph nodes: Cervical, supraclavicular, and axillary nodes normal. Resp: clear to auscultation bilaterally Back: symmetric, no curvature. ROM normal. No CVA tenderness. Cardio: regular rate and rhythm, S1, S2 normal, no murmur, click, rub or gallop GI: soft, non-tender; bowel sounds normal; no masses,  no organomegaly Extremities: extremities normal, atraumatic, no cyanosis or edema  ECOG PERFORMANCE STATUS: 1  - Symptomatic but completely ambulatory  Blood pressure (!) 144/82, pulse 61, temperature 97.9 F (36.6 C), temperature source Temporal, resp. rate 17, height 5\' 5"  (1.651 m), weight 160 lb (72.6 kg), SpO2 97%.  LABORATORY DATA: Lab Results  Component Value Date   WBC 33.5 (H) 02/24/2024   HGB 13.5 02/24/2024   HCT 42.1 02/24/2024   MCV 95.7 02/24/2024   PLT 154 02/24/2024      Chemistry      Component Value Date/Time   NA 140 02/24/2024 0919   NA 139 06/28/2021 0954   K 3.6 02/24/2024 0919   CL 104 02/24/2024 0919   CO2 29 02/24/2024 0919   BUN 14 02/24/2024 0919   BUN 11 06/28/2021 0954   CREATININE 0.92 02/24/2024 0919      Component Value Date/Time   CALCIUM  9.3 02/24/2024 0919   ALKPHOS 53 02/24/2024 0919   AST 18 02/24/2024 0919   ALT 14 02/24/2024 0919   BILITOT 2.0 (H) 02/24/2024 0919       RADIOGRAPHIC STUDIES: No results found.  ASSESSMENT AND PLAN: This is a very pleasant 78 years old African-American male with history of rectal cancer diagnosed in 2007 status post concurrent chemoradiation followed by surgical resection.  At the same time the patient was diagnosed with chronic lymphocytic leukemia and has been in observation initially for several years but started on treatment with ibrutinib  420 mg in 2019.  He tolerated his treatment well until November 2024 when he had evidence for disease progression.  We switched his treatment to acalabrutinib  100 mg p.o. twice daily started August 28, 2023 and he has been tolerating it fairly well.    Chronic lymphocytic leukemia Diagnosed in 2007. Previously treated with ibrutinib  until November 2024, discontinued due to disease progression. Currently on acalabrutinib  100 mg PO BID since November 2024 with no complaints or adverse effects. Laboratory results show improvement: WBC 33.5, hemoglobin 13.5, hematocrit 42.1, platelets 154. Uric acid level is 4.3. Blood glucose is low at 59, but he was not fasting. - Continue  acalabrutinib  100 mg PO BID - Advise to consume a piece of candy or similar to address low blood glucose - Schedule follow-up appointment in six weeks   The patient was advised to call immediately if he has any concerning symptoms in the interval. The patient voices understanding of current disease status and treatment options and is in agreement with the current care plan. All questions were answered. The patient knows to call the clinic with any problems, questions or concerns. We can certainly see the patient much sooner if necessary.  Disclaimer: This note was dictated with voice recognition software. Similar sounding words can inadvertently be transcribed and may not be corrected upon review.

## 2024-02-25 ENCOUNTER — Other Ambulatory Visit: Payer: Self-pay

## 2024-02-25 ENCOUNTER — Other Ambulatory Visit: Payer: Self-pay | Admitting: Pharmacy Technician

## 2024-02-25 NOTE — Progress Notes (Signed)
 Specialty Pharmacy Refill Coordination Note  Aaron Gonzalez is a 78 y.o. male contacted today regarding refills of specialty medication(s) Acalabrutinib  Maleate (Calquence )   Patient requested Delivery   Delivery date: 02/26/24   Verified address: [address]  [place] [address]   Medication will be filled on 02/25/24.

## 2024-03-18 ENCOUNTER — Other Ambulatory Visit: Payer: Self-pay

## 2024-03-22 ENCOUNTER — Other Ambulatory Visit: Payer: Self-pay

## 2024-04-06 ENCOUNTER — Inpatient Hospital Stay: Admitting: Internal Medicine

## 2024-04-06 ENCOUNTER — Inpatient Hospital Stay: Attending: Internal Medicine

## 2024-04-06 VITALS — BP 117/73 | HR 69 | Temp 98.7°F | Resp 17 | Ht 65.0 in | Wt 158.2 lb

## 2024-04-06 DIAGNOSIS — Z85048 Personal history of other malignant neoplasm of rectum, rectosigmoid junction, and anus: Secondary | ICD-10-CM | POA: Insufficient documentation

## 2024-04-06 DIAGNOSIS — C9110 Chronic lymphocytic leukemia of B-cell type not having achieved remission: Secondary | ICD-10-CM | POA: Diagnosis not present

## 2024-04-06 DIAGNOSIS — Z923 Personal history of irradiation: Secondary | ICD-10-CM | POA: Insufficient documentation

## 2024-04-06 LAB — CMP (CANCER CENTER ONLY)
ALT: 14 U/L (ref 0–44)
AST: 18 U/L (ref 15–41)
Albumin: 4.4 g/dL (ref 3.5–5.0)
Alkaline Phosphatase: 45 U/L (ref 38–126)
Anion gap: 7 (ref 5–15)
BUN: 16 mg/dL (ref 8–23)
CO2: 27 mmol/L (ref 22–32)
Calcium: 9.2 mg/dL (ref 8.9–10.3)
Chloride: 105 mmol/L (ref 98–111)
Creatinine: 0.92 mg/dL (ref 0.61–1.24)
GFR, Estimated: >60 mL/min (ref >60)
Glucose, Bld: 80 mg/dL (ref 70–99)
Potassium: 3.8 mmol/L (ref 3.5–5.1)
Sodium: 139 mmol/L (ref 135–145)
Total Bilirubin: 1.3 mg/dL — ABNORMAL HIGH (ref 0.0–1.2)
Total Protein: 6.9 g/dL (ref 6.5–8.1)

## 2024-04-06 LAB — CBC WITH DIFFERENTIAL (CANCER CENTER ONLY)
Abs Immature Granulocytes: 0.05 10*3/uL (ref 0.00–0.07)
Basophils Absolute: 0.1 10*3/uL (ref 0.0–0.1)
Basophils Relative: 0 %
Eosinophils Absolute: 0.3 10*3/uL (ref 0.0–0.5)
Eosinophils Relative: 1 %
HCT: 41.3 % (ref 39.0–52.0)
Hemoglobin: 13.4 g/dL (ref 13.0–17.0)
Immature Granulocytes: 0 %
Lymphocytes Relative: 85 %
Lymphs Abs: 33 10*3/uL — ABNORMAL HIGH (ref 0.7–4.0)
MCH: 31.5 pg (ref 26.0–34.0)
MCHC: 32.4 g/dL (ref 30.0–36.0)
MCV: 97.2 fL (ref 80.0–100.0)
Monocytes Absolute: 2.5 10*3/uL — ABNORMAL HIGH (ref 0.1–1.0)
Monocytes Relative: 7 %
Neutro Abs: 2.5 10*3/uL (ref 1.7–7.7)
Neutrophils Relative %: 7 %
Platelet Count: 112 10*3/uL — ABNORMAL LOW (ref 150–400)
RBC: 4.25 MIL/uL (ref 4.22–5.81)
RDW: 15.2 % (ref 11.5–15.5)
Smear Review: NORMAL
WBC Count: 38.4 10*3/uL — ABNORMAL HIGH (ref 4.0–10.5)
nRBC: 0 % (ref 0.0–0.2)

## 2024-04-06 LAB — URIC ACID: Uric Acid, Serum: 4.7 mg/dL (ref 3.7–8.6)

## 2024-04-06 LAB — LACTATE DEHYDROGENASE: LDH: 296 U/L — ABNORMAL HIGH (ref 98–192)

## 2024-04-06 NOTE — Progress Notes (Signed)
 [facility] [phone]   [phone]  OFFICE PROGRESS NOTE  Aaron Gander, MD [address] [place] [address]  DIAGNOSIS:  1) History of rectal cancer in 2007 status post concurrent chemoradiation followed by surgery. 2) Chronic lymphocytic leukemia diagnosed in 2007.  PRIOR THERAPY: Ibrutinib  420 mg p.o. daily first 2007 and discontinued August 26, 2023 secondary to disease progression.  CURRENT THERAPY: Acalabrutinib  100 mg p.o. twice daily.  First dose started August 28, 2023  INTERVAL HISTORY: JESSIAH STEINHART 78 y.o. male returns to the clinic today for follow-up visit. Discussed the use of AI scribe software for clinical note transcription with the patient, who gave verbal consent to proceed.  History of Present Illness   Aaron Gonzalez is a 78 year old male with rectal cancer and chronic lymphocytic leukemia who presents for evaluation and repeat blood work.  He has a history of rectal cancer diagnosed in 2007, treated with concurrent chemoradiation followed by surgery. There are no current issues or active treatment related to rectal cancer.  He was diagnosed with chronic lymphocytic leukemia in 2007. Initially treated with ibrutinib  until November 2024, it was discontinued due to disease progression. He is currently on acalabrutinib  100 mg twice a day since November 2024 and tolerates it well. Blood counts have increased slightly but remain within acceptable limits. No new complaints or symptoms are reported. No nausea, vomiting, diarrhea, headaches, or bleeding. He mentions a recent weight loss of two pounds.  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 He humorously notes that the opposing teams might be 'cheating' by including younger players.       MEDICAL HISTORY: Past Medical History:  Diagnosis Date   Arthritis    CLL (chronic lymphocytic  leukemia) (HCC)    DVT (deep venous thrombosis) (HCC)    History of rectal cancer    Hypertension    Stroke (HCC)     ALLERGIES:  has no known allergies.  MEDICATIONS:  Current Outpatient Medications  Medication Sig Dispense Refill   acalabrutinib  maleate (CALQUENCE ) 100 MG tablet Take 1 tablet (100 mg total) by mouth 2 (two) times daily. 60 tablet 2   allopurinol  (ZYLOPRIM ) 100 MG tablet TAKE 1 TABLET(100 MG) BY MOUTH TWICE DAILY 60 tablet 2   amLODipine  (NORVASC ) 10 MG tablet Take 1 tablet by mouth daily.     blood glucose meter kit and supplies KIT Dispense based on patient and insurance preference. Use up to four times daily as directed. 1 each 0   carvedilol  (COREG ) 6.25 MG tablet TAKE 1 TABLET(6.25 MG) BY MOUTH TWICE DAILY 180 tablet 3   cyclobenzaprine  (FLEXERIL ) 10 MG tablet Take 1 tablet (10 mg total) by mouth 3 (three) times daily as needed for muscle spasms. 30 tablet 2   meloxicam  (MOBIC ) 15 MG tablet TAKE 1 TABLET(15 MG) BY MOUTH DAILY 30 tablet 2   No current facility-administered medications for this visit.    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   Procedure: Left TOTAL HIP ARTHROPLASTY ANTERIOR APPROACH;  Surgeon: Arnie Lao, MD;  Location: [facility];  Service: Orthopedics;  Laterality: Left;  RNFA    REVIEW OF SYSTEMS:  A comprehensive review of systems was negative.   PHYSICAL EXAMINATION: General appearance: alert, cooperative, and no distress Head:  Normocephalic, without obvious abnormality, atraumatic Neck: no adenopathy, no JVD, supple, symmetrical, trachea midline, and thyroid not enlarged, symmetric, no tenderness/mass/nodules Lymph nodes: Cervical, supraclavicular, and axillary nodes normal. Resp: clear to auscultation bilaterally Back: symmetric, no curvature. ROM normal. No CVA tenderness. Cardio: regular rate and rhythm, S1, S2 normal, no  murmur, click, rub or gallop GI: soft, non-tender; bowel sounds normal; no masses,  no organomegaly Extremities: extremities normal, atraumatic, no cyanosis or edema  ECOG PERFORMANCE STATUS: 1 - Symptomatic but completely ambulatory  Blood pressure 117/73, pulse 69, temperature 98.7 F (37.1 C), temperature source Oral, resp. rate 17, height 5' 5 (1.651 m), weight 158 lb 3.2 oz (71.8 kg), SpO2 97%.  LABORATORY DATA: Lab Results  Component Value Date   WBC 38.4 (H) 04/06/2024   HGB 13.4 04/06/2024   HCT 41.3 04/06/2024   MCV 97.2 04/06/2024   PLT 112 (L) 04/06/2024      Chemistry      Component Value Date/Time   NA 140 02/24/2024 0919   NA 139 06/28/2021 0954   K 3.6 02/24/2024 0919   CL 104 02/24/2024 0919   CO2 29 02/24/2024 0919   BUN 14 02/24/2024 0919   BUN 11 06/28/2021 0954   CREATININE 0.92 02/24/2024 0919      Component Value Date/Time   CALCIUM  9.3 02/24/2024 0919   ALKPHOS 53 02/24/2024 0919   AST 18 02/24/2024 0919   ALT 14 02/24/2024 0919   BILITOT 2.0 (H) 02/24/2024 0919       RADIOGRAPHIC STUDIES: No results found.  ASSESSMENT AND PLAN: This is a very pleasant 78 years old African-American male with history of rectal cancer diagnosed in 2007 status post concurrent chemoradiation followed by surgical resection.  At the same time the patient was diagnosed with chronic lymphocytic leukemia and has been in observation initially for several years but started on treatment with ibrutinib  420 mg in 2019.  He tolerated his treatment well until November 2024 when he had evidence for disease progression.  We switched his treatment to acalabrutinib  100 mg p.o. twice daily started August 28, 2023 and he has been tolerating it fairly well. Repeat blood work today showed persistent leukocytosis. Assessment and Plan    Chronic lymphocytic leukemia Diagnosed in 2007. Previously treated with ibrutinib  until November 2024, discontinued due to disease progression.  Currently on acalabrutinib  100 mg twice daily since November 2024, with good tolerance and no reported side effects such as nausea, vomiting, diarrhea, headaches, or bleeding issues. Blood count has increased slightly but remains acceptable. - Continue acalabrutinib  100 mg twice daily - Monitor blood count closely - Schedule follow-up in one month  Rectal cancer Diagnosed in 2007, status post concurrent chemoradiation followed by surgery. No new complaints or issues reported.   The patient was advised to call immediately if he has any concerning symptoms in the interval.  The patient voices understanding of current disease status and treatment options and is in agreement with the current care plan. All questions were answered. The patient knows to call the clinic with any problems, questions or concerns. We can certainly see the patient much sooner if necessary.  Disclaimer: This note was dictated with voice recognition software. Similar sounding words can inadvertently be transcribed and may not be corrected upon review.

## 2024-04-14 ENCOUNTER — Other Ambulatory Visit: Payer: Self-pay

## 2024-04-19 ENCOUNTER — Other Ambulatory Visit (HOSPITAL_COMMUNITY): Payer: Self-pay

## 2024-04-19 ENCOUNTER — Other Ambulatory Visit: Payer: Self-pay | Admitting: Pharmacy Technician

## 2024-04-19 ENCOUNTER — Telehealth: Payer: Self-pay | Admitting: Pharmacy Technician

## 2024-04-19 ENCOUNTER — Other Ambulatory Visit: Payer: Self-pay | Admitting: Internal Medicine

## 2024-04-19 DIAGNOSIS — C9110 Chronic lymphocytic leukemia of B-cell type not having achieved remission: Secondary | ICD-10-CM

## 2024-04-19 MED ORDER — CALQUENCE 100 MG PO TABS
100.0000 mg | ORAL_TABLET | Freq: Two times a day (BID) | ORAL | 2 refills | Status: DC
Start: 2024-04-19 — End: 2024-06-10
  Filled 2024-04-20: qty 60, 30d supply, fill #0
  Filled 2024-05-14: qty 60, 30d supply, fill #1

## 2024-04-19 NOTE — Progress Notes (Signed)
 Specialty Pharmacy Refill Coordination Note  TAEDEN GELLER is a 78 y.o. male contacted today regarding refills of specialty medication(s) Acalabrutinib  Maleate (Calquence )   Patient requested Delivery   Delivery date: 04/21/24   Verified address: [address]   [place] [address]   Medication will be filled on 04/20/24.  This fill date is pending response to refill request from provider. Patient is aware and if they have not received fill by intended date they must follow up with pharmacy.

## 2024-04-19 NOTE — Telephone Encounter (Signed)
 Can you send in a refill for his Calquence  to the [facility]? Thanks. (I sent an electronic refill request, that you may be able to reply to)

## 2024-04-20 ENCOUNTER — Other Ambulatory Visit: Payer: Self-pay

## 2024-05-04 ENCOUNTER — Encounter: Payer: Self-pay | Admitting: Podiatry

## 2024-05-04 ENCOUNTER — Ambulatory Visit: Admitting: Podiatry

## 2024-05-04 DIAGNOSIS — M79674 Pain in right toe(s): Secondary | ICD-10-CM | POA: Diagnosis not present

## 2024-05-04 DIAGNOSIS — B351 Tinea unguium: Secondary | ICD-10-CM | POA: Diagnosis not present

## 2024-05-04 DIAGNOSIS — M79675 Pain in left toe(s): Secondary | ICD-10-CM

## 2024-05-04 DIAGNOSIS — I739 Peripheral vascular disease, unspecified: Secondary | ICD-10-CM

## 2024-05-09 NOTE — Progress Notes (Signed)
  Subjective:  Patient ID: Aaron Gonzalez, male    DOB: 1946/08/23,  MRN: [id number]  Aaron Gonzalez presents to clinic today for at risk foot care. Patient has h/o PAD and painful thick toenails that are difficult to trim. Pain interferes with ambulation. Aggravating factors include wearing enclosed shoe gear. Pain is relieved with periodic professional debridement.   New problem(s): None.   PCP is [name], MD.  No Known Allergies  Review of Systems: Negative except as noted in the HPI.  Objective: No changes noted in today's physical examination. There were no vitals filed for this visit. Aaron Gonzalez is a pleasant 78 y.o. male WD, WN in NAD. AAO x 3.  Vascular Examination: CFT less than 3 seconds. DP pulses palpable b/l. PT pulses nonpalpable b/l. Digital hair absent. Skin temperature gradient warm to warn b/l. No ischemia or gangrene. No cyanosis or clubbing noted b/l. Unilateral lymphedema left LE.   Neurological Examination: Sensation grossly intact b/l with 10 gram monofilament. Vibratory sensation intact b/l.   Dermatological Examination: Pedal skin thin, shiny and atrophic b/l. No open wounds. No interdigital macerations.   Raised benign papule noted anterior aspect left ankle. Annular with no asymmetry, normal border, normal color.  Toenails 1-5 b/l thick, discolored, elongated with subungual debris and pain on dorsal palpation.   No hyperkeratotic nor porokeratotic lesions present on today's visit.  Musculoskeletal Examination: Muscle strength 5/5 to all lower extremity muscle groups bilaterally. No pain, crepitus or joint limitation noted with ROM bilateral LE. Pes planus deformity noted bilateral LE.  Radiographs: None   Assessment/Plan: 1. Pain due to onychomycosis of toenails of both feet   2. PAD (peripheral artery disease) (HCC)    Consent given for treatment. Patient examined. All patient's and/or POA's questions/concerns addressed on today's  visit. Toenails 1-5 debrided in length and girth without incident. Continue foot and shoe inspections daily. Monitor blood glucose per PCP/Endocrinologist's recommendations. Continue soft, supportive shoe gear daily. Report any pedal injuries to medical professional. Call office if there are any questions/concerns. -Patient/POA to call should there be question/concern in the interim.   Return in about 3 months (around 08/04/2024).  Aaron Gonzalez, DPM      [facility] LOCATION: [address] [place] [address]                   Office [phone]   [facility] LOCATION: [address] [place], [place] [address] Office [phone]

## 2024-05-11 ENCOUNTER — Other Ambulatory Visit (HOSPITAL_COMMUNITY): Payer: Self-pay

## 2024-05-13 ENCOUNTER — Telehealth: Payer: Self-pay

## 2024-05-13 ENCOUNTER — Inpatient Hospital Stay: Admitting: Internal Medicine

## 2024-05-13 ENCOUNTER — Inpatient Hospital Stay: Attending: Internal Medicine

## 2024-05-13 VITALS — BP 128/74 | HR 71 | Temp 97.9°F | Resp 17 | Ht 65.0 in | Wt 156.0 lb

## 2024-05-13 DIAGNOSIS — Z79899 Other long term (current) drug therapy: Secondary | ICD-10-CM | POA: Insufficient documentation

## 2024-05-13 DIAGNOSIS — Z923 Personal history of irradiation: Secondary | ICD-10-CM | POA: Insufficient documentation

## 2024-05-13 DIAGNOSIS — C9110 Chronic lymphocytic leukemia of B-cell type not having achieved remission: Secondary | ICD-10-CM | POA: Insufficient documentation

## 2024-05-13 DIAGNOSIS — Z85048 Personal history of other malignant neoplasm of rectum, rectosigmoid junction, and anus: Secondary | ICD-10-CM | POA: Insufficient documentation

## 2024-05-13 DIAGNOSIS — Z86718 Personal history of other venous thrombosis and embolism: Secondary | ICD-10-CM | POA: Diagnosis not present

## 2024-05-13 LAB — CBC WITH DIFFERENTIAL (CANCER CENTER ONLY)
Abs Immature Granulocytes: 0.05 K/uL (ref 0.00–0.07)
Basophils Absolute: 0.3 K/uL — ABNORMAL HIGH (ref 0.0–0.1)
Basophils Relative: 1 %
Eosinophils Absolute: 0.3 K/uL (ref 0.0–0.5)
Eosinophils Relative: 1 %
HCT: 39.5 % (ref 39.0–52.0)
Hemoglobin: 13.1 g/dL (ref 13.0–17.0)
Immature Granulocytes: 0 %
Lymphocytes Relative: 84 %
Lymphs Abs: 46 K/uL — ABNORMAL HIGH (ref 0.7–4.0)
MCH: 32.4 pg (ref 26.0–34.0)
MCHC: 33.2 g/dL (ref 30.0–36.0)
MCV: 97.8 fL (ref 80.0–100.0)
Monocytes Absolute: 4.6 K/uL — ABNORMAL HIGH (ref 0.1–1.0)
Monocytes Relative: 9 %
Neutro Abs: 2.6 K/uL (ref 1.7–7.7)
Neutrophils Relative %: 5 %
Platelet Count: 133 K/uL — ABNORMAL LOW (ref 150–400)
RBC: 4.04 MIL/uL — ABNORMAL LOW (ref 4.22–5.81)
RDW: 15.1 % (ref 11.5–15.5)
Smear Review: NORMAL
WBC Count: 53.8 K/uL (ref 4.0–10.5)
nRBC: 0 % (ref 0.0–0.2)

## 2024-05-13 LAB — CMP (CANCER CENTER ONLY)
ALT: 10 U/L (ref 0–44)
AST: 19 U/L (ref 15–41)
Albumin: 4.2 g/dL (ref 3.5–5.0)
Alkaline Phosphatase: 46 U/L (ref 38–126)
Anion gap: 4 — ABNORMAL LOW (ref 5–15)
BUN: 17 mg/dL (ref 8–23)
CO2: 31 mmol/L (ref 22–32)
Calcium: 9.3 mg/dL (ref 8.9–10.3)
Chloride: 106 mmol/L (ref 98–111)
Creatinine: 0.97 mg/dL (ref 0.61–1.24)
GFR, Estimated: >60 mL/min (ref >60)
Glucose, Bld: 60 mg/dL — ABNORMAL LOW (ref 70–99)
Potassium: 4.1 mmol/L (ref 3.5–5.1)
Sodium: 141 mmol/L (ref 135–145)
Total Bilirubin: 1.6 mg/dL — ABNORMAL HIGH (ref 0.0–1.2)
Total Protein: 6.6 g/dL (ref 6.5–8.1)

## 2024-05-13 LAB — LACTATE DEHYDROGENASE: LDH: 375 U/L — ABNORMAL HIGH (ref 98–192)

## 2024-05-13 NOTE — Progress Notes (Signed)
 [facility] Telephone:([phone]   Fax:(336) [phone]  OFFICE PROGRESS NOTE  Aaron Leni Edyth DELENA, MD [address] [place] [address]  DIAGNOSIS:  1) History of rectal cancer in 2007 status post concurrent chemoradiation followed by surgery. 2) Chronic lymphocytic leukemia diagnosed in 2007.  PRIOR THERAPY: Ibrutinib  420 mg p.o. daily first 2007 and discontinued August 26, 2023 secondary to disease progression.  CURRENT THERAPY: Acalabrutinib  100 mg p.o. twice daily.  First dose started August 28, 2023  INTERVAL HISTORY: Aaron Gonzalez 78 y.o. male returns to the clinic today for follow-up visit. Discussed the use of AI scribe software for clinical note transcription with the patient, who gave verbal consent to proceed.  History of Present Illness Aaron Gonzalez is a 78 year old male with chronic lymphocytic leukemia who presents for evaluation and repeat blood work.  He has a history of chronic lymphocytic leukemia diagnosed in 2007. Previously, he was on ibrutinib  until November 2024, when it was discontinued due to disease progression. He is currently on acalabrutinib  100 mg PO BID, which he started on August 28, 2023. His white blood cell count remains elevated at 53.8.  He experiences morning headaches that resolve spontaneously without associated symptoms such as nausea, vomiting, or visual disturbances.  No side effects from acalabrutinib , including nausea, vomiting, diarrhea, chest pain, or increased shortness of breath. He notes a slight weight loss of approximately one pound, from 158 to 156.9 pounds.     MEDICAL HISTORY: Past Medical History:  Diagnosis Date   Arthritis    CLL (chronic lymphocytic leukemia) (HCC)    DVT (deep venous thrombosis) (HCC)    History of rectal cancer    Hypertension    Stroke (HCC)     ALLERGIES:  has no known allergies.  MEDICATIONS:  Current Outpatient Medications  Medication Sig Dispense Refill    acalabrutinib  maleate (CALQUENCE ) 100 MG tablet Take 1 tablet (100 mg total) by mouth 2 (two) times daily. 60 tablet 2   allopurinol  (ZYLOPRIM ) 100 MG tablet TAKE 1 TABLET(100 MG) BY MOUTH TWICE DAILY 60 tablet 2   amLODipine  (NORVASC ) 10 MG tablet Take 1 tablet by mouth daily.     blood glucose meter kit and supplies KIT Dispense based on patient and insurance preference. Use up to four times daily as directed. 1 each 0   carvedilol  (COREG ) 6.25 MG tablet TAKE 1 TABLET(6.25 MG) BY MOUTH TWICE DAILY 180 tablet 3   cyclobenzaprine  (FLEXERIL ) 10 MG tablet Take 1 tablet (10 mg total) by mouth 3 (three) times daily as needed for muscle spasms. 30 tablet 2   meloxicam  (MOBIC ) 15 MG tablet TAKE 1 TABLET(15 MG) BY MOUTH DAILY 30 tablet 2   No current facility-administered medications for this visit.    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   Procedure: Left TOTAL HIP ARTHROPLASTY ANTERIOR APPROACH;  Surgeon: Vernetta Lonni GRADE, MD;  Location: [facility];  Service: Orthopedics;  Laterality: Left;  RNFA    REVIEW OF SYSTEMS:  A comprehensive review of systems was negative except for: Neurological: positive for headaches   PHYSICAL EXAMINATION: General appearance: alert, cooperative, and no distress Head: Normocephalic, without obvious abnormality, atraumatic Neck: no adenopathy, no JVD, supple, symmetrical, trachea midline, and thyroid not enlarged, symmetric, no tenderness/mass/nodules Lymph nodes: Cervical, supraclavicular, and axillary nodes normal. Resp: clear to auscultation bilaterally  Back: symmetric, no curvature. ROM normal. No CVA tenderness. Cardio: regular rate and rhythm, S1, S2 normal, no murmur, click, rub or gallop GI: soft, non-tender; bowel sounds normal; no masses,  no organomegaly Extremities: extremities normal, atraumatic, no cyanosis or edema  ECOG PERFORMANCE STATUS: 1  - Symptomatic but completely ambulatory  Blood pressure 128/74, pulse 71, temperature 97.9 F (36.6 C), temperature source Temporal, resp. rate 17, height 5' 5 (1.651 m), weight 156 lb (70.8 kg), SpO2 97%.  LABORATORY DATA: Lab Results  Component Value Date   WBC 53.8 (HH) 05/13/2024   HGB 13.1 05/13/2024   HCT 39.5 05/13/2024   MCV 97.8 05/13/2024   PLT 133 (L) 05/13/2024      Chemistry      Component Value Date/Time   NA 141 05/13/2024 1012   NA 139 06/28/2021 0954   K 4.1 05/13/2024 1012   CL 106 05/13/2024 1012   CO2 31 05/13/2024 1012   BUN 17 05/13/2024 1012   BUN 11 06/28/2021 0954   CREATININE 0.97 05/13/2024 1012      Component Value Date/Time   CALCIUM  9.3 05/13/2024 1012   ALKPHOS 46 05/13/2024 1012   AST 19 05/13/2024 1012   ALT 10 05/13/2024 1012   BILITOT 1.6 (H) 05/13/2024 1012       RADIOGRAPHIC STUDIES: No results found.  ASSESSMENT AND PLAN: This is a very pleasant 78 years old African-American male with history of rectal cancer diagnosed in 2007 status post concurrent chemoradiation followed by surgical resection.  At the same time the patient was diagnosed with chronic lymphocytic leukemia and has been in observation initially for several years but started on treatment with ibrutinib  420 mg in 2019.  He tolerated his treatment well until November 2024 when he had evidence for disease progression.  We switched his treatment to acalabrutinib  100 mg p.o. twice daily started August 28, 2023 and he has been tolerating it fairly well. Repeat CBC today showed further increase in his total white blood count. Assessment and Plan Assessment & Plan Chronic lymphocytic leukemia Chronic lymphocytic leukemia diagnosed in 2007. Previously on ibrutinib  until November 2024, discontinued due to disease progression. Currently on acalabrutinib  100 mg PO BID since August 28, 2023. Reports morning headaches that resolve spontaneously. No side effects from acalabrutinib   such as nausea, vomiting, diarrhea, chest pain, or increased shortness of breath. Slight weight loss noted, approximately 1 pound. White blood cell count remains elevated at 53.8, indicating ongoing disease activity. - Continue acalabrutinib  100 mg PO BID. - Monitor white blood cell count closely. - Schedule follow-up appointment in one month with repeat blood work. - Consider alternative treatment options if white blood cell count continues to increase. The patient was advised to call immediately if he has any concerning symptoms in the interval. The patient voices understanding of current disease status and treatment options and is in agreement with the current care plan. All questions were answered. The patient knows to call the clinic with any problems, questions or concerns. We can certainly see the patient much sooner if necessary.  Disclaimer: This note was dictated with voice recognition software. Similar sounding words can inadvertently be transcribed and may not be corrected upon review.

## 2024-05-13 NOTE — Telephone Encounter (Signed)
 CRITICAL VALUE STICKER  CRITICAL VALUE:  WBC 53.8  RECEIVER (on-site recipient of call):Talon Regala, LPN  DATE & TIME NOTIFIED: 05/13/24  10:23  MESSENGER (representative from lab):Heather  MD NOTIFIED:Mohamed Sherrod, MD  TIME OF NOTIFICATION:10:23  RESPONSE:

## 2024-05-14 ENCOUNTER — Other Ambulatory Visit: Payer: Self-pay

## 2024-05-14 NOTE — Progress Notes (Signed)
 Specialty Pharmacy Refill Coordination Note  Aaron Gonzalez is a 78 y.o. male contacted today regarding refills of specialty medication(s) Acalabrutinib  Maleate (Calquence )   Patient requested Delivery   Delivery date: 05/18/24   Verified address: [address]   [place] [address]   Medication will be filled on 05/17/24.

## 2024-05-17 ENCOUNTER — Other Ambulatory Visit: Payer: Self-pay

## 2024-05-20 ENCOUNTER — Other Ambulatory Visit: Payer: Self-pay

## 2024-06-01 ENCOUNTER — Other Ambulatory Visit (HOSPITAL_COMMUNITY): Payer: Self-pay

## 2024-06-04 ENCOUNTER — Other Ambulatory Visit (HOSPITAL_COMMUNITY): Payer: Self-pay

## 2024-06-08 ENCOUNTER — Other Ambulatory Visit (HOSPITAL_COMMUNITY): Payer: Self-pay

## 2024-06-10 ENCOUNTER — Encounter: Payer: Self-pay | Admitting: Internal Medicine

## 2024-06-10 ENCOUNTER — Inpatient Hospital Stay

## 2024-06-10 ENCOUNTER — Telehealth: Payer: Self-pay

## 2024-06-10 ENCOUNTER — Other Ambulatory Visit: Payer: Self-pay

## 2024-06-10 ENCOUNTER — Telehealth: Payer: Self-pay | Admitting: Pharmacist

## 2024-06-10 ENCOUNTER — Encounter: Payer: Self-pay | Admitting: *Deleted

## 2024-06-10 ENCOUNTER — Other Ambulatory Visit (HOSPITAL_COMMUNITY): Payer: Self-pay

## 2024-06-10 ENCOUNTER — Inpatient Hospital Stay (HOSPITAL_BASED_OUTPATIENT_CLINIC_OR_DEPARTMENT_OTHER): Admitting: Internal Medicine

## 2024-06-10 ENCOUNTER — Inpatient Hospital Stay: Attending: Internal Medicine

## 2024-06-10 VITALS — BP 148/83 | HR 75 | Temp 98.0°F | Resp 16 | Ht 65.0 in | Wt 158.0 lb

## 2024-06-10 DIAGNOSIS — R634 Abnormal weight loss: Secondary | ICD-10-CM | POA: Diagnosis not present

## 2024-06-10 DIAGNOSIS — Z791 Long term (current) use of non-steroidal anti-inflammatories (NSAID): Secondary | ICD-10-CM | POA: Diagnosis not present

## 2024-06-10 DIAGNOSIS — C9110 Chronic lymphocytic leukemia of B-cell type not having achieved remission: Secondary | ICD-10-CM | POA: Insufficient documentation

## 2024-06-10 DIAGNOSIS — D696 Thrombocytopenia, unspecified: Secondary | ICD-10-CM | POA: Diagnosis not present

## 2024-06-10 DIAGNOSIS — Z79899 Other long term (current) drug therapy: Secondary | ICD-10-CM | POA: Insufficient documentation

## 2024-06-10 DIAGNOSIS — Z85048 Personal history of other malignant neoplasm of rectum, rectosigmoid junction, and anus: Secondary | ICD-10-CM | POA: Diagnosis not present

## 2024-06-10 DIAGNOSIS — D649 Anemia, unspecified: Secondary | ICD-10-CM | POA: Diagnosis not present

## 2024-06-10 DIAGNOSIS — Z8673 Personal history of transient ischemic attack (TIA), and cerebral infarction without residual deficits: Secondary | ICD-10-CM | POA: Diagnosis not present

## 2024-06-10 DIAGNOSIS — I10 Essential (primary) hypertension: Secondary | ICD-10-CM | POA: Insufficient documentation

## 2024-06-10 DIAGNOSIS — Z86718 Personal history of other venous thrombosis and embolism: Secondary | ICD-10-CM | POA: Insufficient documentation

## 2024-06-10 DIAGNOSIS — Z923 Personal history of irradiation: Secondary | ICD-10-CM | POA: Diagnosis not present

## 2024-06-10 LAB — CMP (CANCER CENTER ONLY)
ALT: 12 U/L (ref 0–44)
AST: 28 U/L (ref 15–41)
Albumin: 4.3 g/dL (ref 3.5–5.0)
Alkaline Phosphatase: 58 U/L (ref 38–126)
Anion gap: 3 — ABNORMAL LOW (ref 5–15)
BUN: 12 mg/dL (ref 8–23)
CO2: 32 mmol/L (ref 22–32)
Calcium: 9 mg/dL (ref 8.9–10.3)
Chloride: 106 mmol/L (ref 98–111)
Creatinine: 0.84 mg/dL (ref 0.61–1.24)
GFR, Estimated: >60 mL/min (ref >60)
Glucose, Bld: 74 mg/dL (ref 70–99)
Potassium: 4 mmol/L (ref 3.5–5.1)
Sodium: 141 mmol/L (ref 135–145)
Total Bilirubin: 1.2 mg/dL (ref 0.0–1.2)
Total Protein: 6.6 g/dL (ref 6.5–8.1)

## 2024-06-10 LAB — LACTATE DEHYDROGENASE: LDH: 1036 U/L — ABNORMAL HIGH (ref 98–192)

## 2024-06-10 LAB — CBC WITH DIFFERENTIAL (CANCER CENTER ONLY)
Abs Immature Granulocytes: 0.14 K/uL — ABNORMAL HIGH (ref 0.00–0.07)
Basophils Absolute: 0.2 K/uL — ABNORMAL HIGH (ref 0.0–0.1)
Basophils Relative: 0 %
Eosinophils Absolute: 0.5 K/uL (ref 0.0–0.5)
Eosinophils Relative: 1 %
HCT: 39.2 % (ref 39.0–52.0)
Hemoglobin: 12.8 g/dL — ABNORMAL LOW (ref 13.0–17.0)
Immature Granulocytes: 0 %
Lymphocytes Relative: 74 %
Lymphs Abs: 62.5 K/uL — ABNORMAL HIGH (ref 0.7–4.0)
MCH: 32.2 pg (ref 26.0–34.0)
MCHC: 32.7 g/dL (ref 30.0–36.0)
MCV: 98.5 fL (ref 80.0–100.0)
Monocytes Absolute: 17.3 K/uL — ABNORMAL HIGH (ref 0.1–1.0)
Monocytes Relative: 21 %
Neutro Abs: 3.7 K/uL (ref 1.7–7.7)
Neutrophils Relative %: 4 %
Platelet Count: 137 K/uL — ABNORMAL LOW (ref 150–400)
RBC: 3.98 MIL/uL — ABNORMAL LOW (ref 4.22–5.81)
RDW: 15.1 % (ref 11.5–15.5)
Smear Review: NORMAL
WBC Count: 84.4 K/uL (ref 4.0–10.5)
nRBC: 0 % (ref 0.0–0.2)

## 2024-06-10 LAB — HEPATITIS PANEL, ACUTE
HCV Ab: NONREACTIVE
Hep A IgM: NONREACTIVE
Hep B C IgM: NONREACTIVE
Hepatitis B Surface Ag: NONREACTIVE

## 2024-06-10 LAB — URIC ACID: Uric Acid, Serum: 5.8 mg/dL (ref 3.7–8.6)

## 2024-06-10 MED ORDER — LIDOCAINE-PRILOCAINE 2.5-2.5 % EX CREA: TOPICAL_CREAM | CUTANEOUS | 3 refills | Status: DC

## 2024-06-10 MED ORDER — SULFAMETHOXAZOLE-TRIMETHOPRIM 800-160 MG PO TABS: 1.0000 | ORAL_TABLET | ORAL | 5 refills | Status: DC

## 2024-06-10 MED ORDER — PROCHLORPERAZINE MALEATE 10 MG PO TABS
10.0000 mg | ORAL_TABLET | Freq: Four times a day (QID) | ORAL | 1 refills | Status: DC | PRN
Start: 2024-06-10 — End: 2024-06-16

## 2024-06-10 MED ORDER — VENETOCLAX 100 MG PO TABS: ORAL_TABLET | ORAL | 0 refills | Status: DC

## 2024-06-10 MED ORDER — VENETOCLAX 50 MG PO TABS: ORAL_TABLET | ORAL | 0 refills | Status: DC

## 2024-06-10 MED ORDER — VENETOCLAX 10 MG PO TABS
ORAL_TABLET | ORAL | 0 refills | Status: DC
Start: 2024-06-10 — End: 2024-06-15

## 2024-06-10 MED ORDER — ALLOPURINOL 300 MG PO TABS: 300.0000 mg | ORAL_TABLET | Freq: Every day | ORAL | 0 refills | Status: DC

## 2024-06-10 MED ORDER — ACYCLOVIR 400 MG PO TABS: 400.0000 mg | ORAL_TABLET | Freq: Every day | ORAL | 5 refills | Status: DC

## 2024-06-10 MED ORDER — ONDANSETRON HCL 8 MG PO TABS: 8.0000 mg | ORAL_TABLET | Freq: Three times a day (TID) | ORAL | 1 refills | Status: DC | PRN

## 2024-06-10 NOTE — Telephone Encounter (Signed)
 Oral Oncology Patient Advocate Encounter  Prior Authorization for Venclexta  has been approved.    PA# [id number] Effective dates: 10/22/23 through 10/20/24.  Patients co-pay is $0.00.    Lucie Lamer, CPhT [place]  [facility] Pharmacy Technician Patient Advocate Specialist II [facility] [place] Phone: [phone]  Fax: [phone] [email or web address]

## 2024-06-10 NOTE — Progress Notes (Signed)
 [facility] Telephone:([phone]   Fax:(336) [phone]  OFFICE PROGRESS NOTE  [name], MD [address] [place] [address]  DIAGNOSIS:  1) History of rectal cancer in 2007 status post concurrent chemoradiation followed by surgery. 2) Chronic lymphocytic leukemia diagnosed in 2007.  PRIOR THERAPY:  1) Ibrutinib  420 mg p.o. daily first 2007 and discontinued August 26, 2023 secondary to disease progression. 2) Acalabrutinib  100 mg p.o. twice daily.  First dose started August 28, 2023.  This discontinued and August 2025 secondary to disease progression and rising total white blood count with anemia and thrombocytopenia.  CURRENT THERAPY: Infusional obinutuzumab in addition to venetoclax .  Expected to start next week.  INTERVAL HISTORY: Aaron Gonzalez 78 y.o. male returns to the clinic today for follow-up visit. Discussed the use of AI scribe software for clinical note transcription with the patient, who gave verbal consent to proceed.  History of Present Illness Aaron Gonzalez is a 78 year old male with chronic lymphocytic leukemia who presents for evaluation and repeat blood work.  He was diagnosed with chronic lymphocytic leukemia in 2007 and was initially treated with ibrutinib  for many years. Due to disease progression, his treatment was switched to acalabrutinib  100 mg PO daily in November 2024.  He has no recent complaints of weight loss, night sweats, chest pain, shortness of breath, cough, or swollen glands. He mentions a weight loss of two pounds, which he attributes to the food quality at his residence, indicating it was intentional.  He has not experienced any issues with the acalabrutinib  treatment. However, his white blood cell count has been increasing, noted to be 84,400 today, up from 38,400 in June and 53,800 in July. His laboratory results show a platelet count of 137,000.  His current medication includes acalabrutinib  100 mg PO  daily.    MEDICAL HISTORY: Past Medical History:  Diagnosis Date   Arthritis    CLL (chronic lymphocytic leukemia) (HCC)    DVT (deep venous thrombosis) (HCC)    History of rectal cancer    Hypertension    Stroke (HCC)     ALLERGIES:  has no known allergies.  MEDICATIONS:  Current Outpatient Medications  Medication Sig Dispense Refill   acalabrutinib  maleate (CALQUENCE ) 100 MG tablet Take 1 tablet (100 mg total) by mouth 2 (two) times daily. 60 tablet 2   allopurinol  (ZYLOPRIM ) 100 MG tablet TAKE 1 TABLET(100 MG) BY MOUTH TWICE DAILY 60 tablet 2   amLODipine  (NORVASC ) 10 MG tablet Take 1 tablet by mouth daily.     blood glucose meter kit and supplies KIT Dispense based on patient and insurance preference. Use up to four times daily as directed. 1 each 0   carvedilol  (COREG ) 6.25 MG tablet TAKE 1 TABLET(6.25 MG) BY MOUTH TWICE DAILY 180 tablet 3   cyclobenzaprine  (FLEXERIL ) 10 MG tablet Take 1 tablet (10 mg total) by mouth 3 (three) times daily as needed for muscle spasms. 30 tablet 2   meloxicam  (MOBIC ) 15 MG tablet TAKE 1 TABLET(15 MG) BY MOUTH DAILY 30 tablet 2   No current facility-administered medications for this visit.    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   Procedure: Left TOTAL HIP ARTHROPLASTY ANTERIOR APPROACH;  Surgeon: Vernetta Lonni GRADE, MD;  Location: [facility];  Service: Orthopedics;  Laterality: Left;  RNFA    REVIEW  OF SYSTEMS:  Constitutional: positive for fatigue Eyes: negative Ears, nose, mouth, throat, and face: negative Respiratory: negative Cardiovascular: negative Gastrointestinal: negative Genitourinary:negative Integument/breast: negative Hematologic/lymphatic: negative Musculoskeletal:negative Neurological: negative Behavioral/Psych: negative Endocrine: negative Allergic/Immunologic: negative   PHYSICAL EXAMINATION: General  appearance: alert, cooperative, fatigued, and no distress Head: Normocephalic, without obvious abnormality, atraumatic Neck: no adenopathy, no JVD, supple, symmetrical, trachea midline, and thyroid not enlarged, symmetric, no tenderness/mass/nodules Lymph nodes: Cervical, supraclavicular, and axillary nodes normal. Resp: clear to auscultation bilaterally Back: symmetric, no curvature. ROM normal. No CVA tenderness. Cardio: regular rate and rhythm, S1, S2 normal, no murmur, click, rub or gallop GI: soft, non-tender; bowel sounds normal; no masses,  no organomegaly Extremities: extremities normal, atraumatic, no cyanosis or edema Neurologic: Alert and oriented X 3, normal strength and tone. Normal symmetric reflexes. Normal coordination and gait  ECOG PERFORMANCE STATUS: 1 - Symptomatic but completely ambulatory  Blood pressure (!) 148/83, pulse 75, temperature 98 F (36.7 C), temperature source Temporal, resp. rate 16, height 5' 5 (1.651 m), weight 158 lb (71.7 kg), SpO2 95%.  LABORATORY DATA: Lab Results  Component Value Date   WBC 84.4 (HH) 06/10/2024   HGB 12.8 (L) 06/10/2024   HCT 39.2 06/10/2024   MCV 98.5 06/10/2024   PLT 137 (L) 06/10/2024      Chemistry      Component Value Date/Time   NA 141 06/10/2024 1019   NA 139 06/28/2021 0954   K 4.0 06/10/2024 1019   CL 106 06/10/2024 1019   CO2 32 06/10/2024 1019   BUN 12 06/10/2024 1019   BUN 11 06/28/2021 0954   CREATININE 0.84 06/10/2024 1019      Component Value Date/Time   CALCIUM  9.0 06/10/2024 1019   ALKPHOS 58 06/10/2024 1019   AST 28 06/10/2024 1019   ALT 12 06/10/2024 1019   BILITOT 1.2 06/10/2024 1019       RADIOGRAPHIC STUDIES: No results found.  ASSESSMENT AND PLAN: This is a very pleasant 78 years old African-American male with history of rectal cancer diagnosed in 2007 status post concurrent chemoradiation followed by surgical resection.  At the same time the patient was diagnosed with chronic  lymphocytic leukemia and has been in observation initially for several years but started on treatment with ibrutinib  420 mg in 2019.  He tolerated his treatment well until November 2024 when he had evidence for disease progression.  We switched his treatment to acalabrutinib  100 mg p.o. twice daily started August 28, 2023 and he has been tolerating it fairly well. CBC today showed continuous increase in the total white blood count suspicious for disease progression. 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 Current evaluation shows increasing white blood cell count from 38.4 in June to 84.4, indicating acalabrutinib  is not effectively controlling the disease. Mild anemia and thrombocytopenia are also present. - Discontinue acalabrutinib  and return remaining medication to the pharmacist. - Initiate venetoclax  treatment with initial infusion therapy for the first six months. - Arrange for pharmacist to educate on venetoclax  and its side effects. - Monitor closely for side effects, including tumor lysis syndrome. - Prescribe allopurinol  to prevent gout and kidney issues. - Plan for one year of venetoclax  treatment followed by observation. He would come back for follow-up visit in 2 weeks.  The patient voices understanding of current disease status and treatment options  and is in agreement with the current care plan. All questions were answered. The patient knows to call the clinic with any problems, questions or concerns. We can certainly see the patient much sooner if necessary. The total time spent in the appointment was 40 minutes including review of chart and various tests results, discussions about plan of care and coordination of care plan .  Disclaimer: This  note was dictated with voice recognition software. Similar sounding words can inadvertently be transcribed and may not be corrected upon review.

## 2024-06-10 NOTE — Telephone Encounter (Addendum)
 Oral Oncology Pharmacist Encounter  Received new prescription for Venclexta  (venetoclax ) for the treatment of R/R CLL in conjunction with obinutuzumab (patient previously on treatment with ibrutinib  followed by Calquence ), planned duration of venetoclax  1 year.  CBC w/ Diff and CMP from 06/10/24 assessed, noted patient WBC elevated at 84.4 K/uL (up from 53.8 K/uL) secondary to disease progression. Patient uric acid 5.8 mg/dL. LDH up to 1036 U/L (previous 375 U/L). Prescription dose and frequency assessed for appropriateness.  Current medication list in Epic reviewed, DDIs with Venclexta  identified: Category D drug-drug interaction between Venclexta  and Carvedilol  - carvedilol  can increase serum concentrations of venetoclax , it's recommended to decrease venetoclax  dosing 50% when patient is on carvedilol . I will confirm if patient is still on carvedilol  - if so, his venetoclax  taper will need to be adjusted to the following: Week 1: Venetoclax  10 mg daily Week 2: Venetoclax  20 mg daily Week 3: Venetoclax  50 mg daily Week 4: Venetoclax  100 mg daily Max venetoclax  dose for patient while on carvedilol  will need to be 200 mg/d.  ADDENDIUM 06/10/2024 2:26 PM Phone call made to patient and confirmed he does take carvedilol . Will proceed with venetoclax  at 50% dose reduction.   Evaluated chart and no patient barriers to medication adherence noted.   Patient agreement for treatment documented in MD note on 06/10/24.  Prescription has been e-scribed to the [facility] for benefits analysis and approval.  I met with patient in clinic on 06/10/24 and provided him with handout on venetoclax  and we will plan to meet for pharmacy education closer to patient starting venetoclax  therapy.  Oral Oncology Clinic will continue to follow for insurance authorization, copayment issues, initial counseling and start date.  Asberry Macintosh, PharmD, BCPS, BCOP Hematology/Oncology Clinical  Pharmacist [phone] 06/10/2024 11:28 AM

## 2024-06-10 NOTE — Progress Notes (Signed)
 CRITICAL VALUE STICKER  CRITICAL VALUE: WBC 84.4  RECEIVER (on-site recipient of call): DOROTHA Cook RN  DATE & TIME NOTIFIED: 06/10/24 @ 1029  MESSENGER (representative from lab): Earnie  MD NOTIFIED: Routed to Dr Sherrod  TIME OF NOTIFICATION:1034  RESPONSE:

## 2024-06-10 NOTE — Progress Notes (Signed)
START OFF PATHWAY REGIMEN - Lymphoma and CLL   [id number]:Venetoclax PO ramp-up (C1-2) followed by Venetoclax PO daily D1-28 (C3-12) + Obinutuzumab IV (C1-6) q28 Days:   Cycle 1: A cycle is 28 days:     Venetoclax      Obinutuzumab      Obinutuzumab      Obinutuzumab    Cycle 2: A cycle is 28 days:     Venetoclax      Venetoclax      Venetoclax      Venetoclax      Obinutuzumab    Cycles 3 through 6: A cycle is 28 days:     Venetoclax      Obinutuzumab    Cycles 7 through 12: A cycle is 28 days:     Venetoclax   **Always confirm dose/schedule in your pharmacy ordering system**  Patient Characteristics: Chronic Lymphocytic Leukemia (CLL), Treatment Indicated, Second Line Disease Type: Chronic Lymphocytic Leukemia (CLL) Disease Type: Not Applicable Disease Type: Not Applicable Treatment Indicated<= Treatment Indicated Line of Therapy: Second Line Intent of Therapy: Non-Curative / Palliative Intent, Discussed with Patient

## 2024-06-10 NOTE — Telephone Encounter (Signed)
 Oral Oncology Patient Advocate Encounter   Received notification that prior authorization for Venclexa is required.   PA submitted on 06/10/24 Key [id number] Status is pending     Lucie Lamer, CPhT [place]  [facility] Pharmacy Technician Patient Advocate Specialist II [facility] Phone: [phone]  Fax: [phone] [email or web address]

## 2024-06-11 ENCOUNTER — Other Ambulatory Visit: Payer: Self-pay

## 2024-06-11 ENCOUNTER — Encounter: Payer: Self-pay | Admitting: Internal Medicine

## 2024-06-11 ENCOUNTER — Other Ambulatory Visit (HOSPITAL_COMMUNITY): Payer: Self-pay

## 2024-06-11 ENCOUNTER — Other Ambulatory Visit: Payer: Self-pay | Admitting: Physician Assistant

## 2024-06-14 ENCOUNTER — Encounter: Payer: Self-pay | Admitting: Internal Medicine

## 2024-06-15 ENCOUNTER — Inpatient Hospital Stay (HOSPITAL_BASED_OUTPATIENT_CLINIC_OR_DEPARTMENT_OTHER): Admitting: Pharmacist

## 2024-06-15 ENCOUNTER — Encounter: Payer: Self-pay | Admitting: Internal Medicine

## 2024-06-15 ENCOUNTER — Other Ambulatory Visit: Payer: Self-pay | Admitting: Physician Assistant

## 2024-06-15 ENCOUNTER — Other Ambulatory Visit: Payer: Self-pay

## 2024-06-15 ENCOUNTER — Inpatient Hospital Stay

## 2024-06-15 DIAGNOSIS — C9110 Chronic lymphocytic leukemia of B-cell type not having achieved remission: Secondary | ICD-10-CM

## 2024-06-15 MED ORDER — VENETOCLAX 100 MG PO TABS
ORAL_TABLET | ORAL | 0 refills | Status: DC
  Filled 2024-08-02: qty 120, fill #0
  Filled 2024-08-02: qty 7, 7d supply, fill #0

## 2024-06-15 MED ORDER — VENETOCLAX 10 MG PO TABS
ORAL_TABLET | ORAL | 0 refills | Status: DC
  Filled 2024-07-12: qty 28, fill #0

## 2024-06-15 MED ORDER — VENETOCLAX 50 MG PO TABS
ORAL_TABLET | ORAL | 0 refills | Status: DC
Start: 2024-06-15 — End: 2024-09-14
  Filled 2024-07-26: qty 7, fill #0
  Filled 2024-07-27: qty 7, 7d supply, fill #0

## 2024-06-15 NOTE — Progress Notes (Signed)
 [facility]cology [facility]  [phone] [phone]  Patient Care Team: Aaron Leni Edyth DELENA, MD as PCP - General (Family Medicine) [place], Peter M, MD as PCP - Cardiology (Cardiology)   Name of the patient: Aaron Gonzalez  [id number]  Jan 26, 1946   Date of visit: 06/15/24  HPI: Patient is a 78 y.o. male with CLL diagnosed in 2007 previously treated with first line therapy of ibrutinib , followed by second line therapy with Calquence . Patient noted to have disease progression in August 2025 requiring change of treatment. Patient will be starting 3rd line therapy with Obinutuzumab (6 months) + venetoclax  (1 year).  Reason for Consult: Pharmacy education for Venclexta  (venetoclax )  PAST MEDICAL HISTORY: Past Medical History:  Diagnosis Date   Arthritis    CLL (chronic lymphocytic leukemia) (HCC)    DVT (deep venous thrombosis) (HCC)    History of rectal cancer    Hypertension    Stroke [facility])     HEMATOLOGY/ONCOLOGY HISTORY:  Oncology History  CLL (chronic lymphocytic leukemia) (HCC)  04/16/2021 Initial Diagnosis   CLL (chronic lymphocytic leukemia) (HCC)   04/16/2021 Cancer Staging   Staging form: Chronic Lymphocytic Leukemia / Small Lymphocytic Lymphoma, AJCC 8th Edition - Clinical: Modified Rai Stage 0 (Modified Rai risk: Low) - Signed by Sherrod Sherrod, MD on 04/16/2021   06/24/2024 -  Chemotherapy   Patient is on Treatment Plan : LYMPHOMA CLL/SLL Venetoclax  + Obinutuzumab q28d       ALLERGIES:  has no known allergies.  MEDICATIONS:  Current Outpatient Medications  Medication Sig Dispense Refill   acyclovir  (ZOVIRAX ) 400 MG tablet Take 1 tablet (400 mg total) by mouth daily. 30 tablet 5   allopurinol  (ZYLOPRIM ) 300 MG tablet Take 1 tablet (300 mg total) by mouth daily. 30 tablet 0   amLODipine  (NORVASC ) 10 MG tablet Take 1 tablet by mouth daily.     blood glucose meter kit and supplies KIT Dispense based on patient and insurance  preference. Use up to four times daily as directed. 1 each 0   carvedilol  (COREG ) 6.25 MG tablet TAKE 1 TABLET(6.25 MG) BY MOUTH TWICE DAILY 180 tablet 3   cyclobenzaprine  (FLEXERIL ) 10 MG tablet Take 1 tablet (10 mg total) by mouth 3 (three) times daily as needed for muscle spasms. 30 tablet 2   lidocaine -prilocaine  (EMLA ) cream Apply to affected area once 30 g 3   meloxicam  (MOBIC ) 15 MG tablet TAKE 1 TABLET(15 MG) BY MOUTH DAILY 30 tablet 2   ondansetron  (ZOFRAN ) 8 MG tablet Take 1 tablet (8 mg total) by mouth every 8 (eight) hours as needed for nausea or vomiting. 30 tablet 1   prochlorperazine  (COMPAZINE ) 10 MG tablet Take 1 tablet (10 mg total) by mouth every 6 (six) hours as needed for nausea or vomiting. 30 tablet 1   sulfamethoxazole -trimethoprim  (BACTRIM  DS) 800-160 MG tablet Take 1 tablet by mouth 3 (three) times a week. 12 tablet 5   venetoclax  (VENCLEXTA ) 10 MG tablet Take 1 tablet (10 mg total) by mouth daily for first week of treatment, then increase to 2 tablets (20 mg total) by mouth daily for second week of treatment.Take with food and water . Start: 07/15/24 21 tablet 0   venetoclax  (VENCLEXTA ) 100 MG tablet Take 1 tablet (100 mg total) by mouth daily for fourth week of treatment. Take with food and water . Start 08/05/24 7 tablet 0   venetoclax  (VENCLEXTA ) 50 MG tablet Take 1 tablet (50 mg total) by mouth daily for third week  of treatment. Take with food and water . Start 07/29/24 7 tablet 0   No current facility-administered medications for this visit.    VITAL SIGNS: There were no vitals taken for this visit. There were no vitals filed for this visit.  Estimated body mass index is 26.29 kg/Gonzalez as calculated from the following:   Height as of 06/10/24: 5' 5 (1.651 Gonzalez).   Weight as of 06/10/24: 71.7 kg (158 lb).  LABS: CBC:    Component Value Date/Time   WBC 84.4 (HH) 06/10/2024 1019   WBC 13.0 (H) 10/27/2021 0308   HGB 12.8 (L) 06/10/2024 1019   HCT 39.2 06/10/2024 1019    PLT 137 (L) 06/10/2024 1019   MCV 98.5 06/10/2024 1019   NEUTROABS 3.7 06/10/2024 1019   LYMPHSABS 62.5 (H) 06/10/2024 1019   MONOABS 17.3 (H) 06/10/2024 1019   EOSABS 0.5 06/10/2024 1019   BASOSABS 0.2 (H) 06/10/2024 1019   Comprehensive Metabolic Panel:    Component Value Date/Time   NA 141 06/10/2024 1019   NA 139 06/28/2021 0954   K 4.0 06/10/2024 1019   CL 106 06/10/2024 1019   CO2 32 06/10/2024 1019   BUN 12 06/10/2024 1019   BUN 11 06/28/2021 0954   CREATININE 0.84 06/10/2024 1019   GLUCOSE 74 06/10/2024 1019   CALCIUM  9.0 06/10/2024 1019   AST 28 06/10/2024 1019   ALT 12 06/10/2024 1019   ALKPHOS 58 06/10/2024 1019   BILITOT 1.2 06/10/2024 1019   PROT 6.6 06/10/2024 1019   PROT 6.9 06/28/2021 0954   ALBUMIN 4.3 06/10/2024 1019   ALBUMIN 4.3 06/28/2021 0954   Present during today's visit: Patient  Patient Education I met with [name] for overview of new chemotherapy: Venclexta  (venetoclax ) for the treatment of CLL, in conjunction with obinutuzumab, planned duration of venetoclax  is 1 year.   Counseled patient on administration, dosing, side effects, monitoring, drug-food interactions, safe handling, storage, and disposal.  Patient's labs from 06/10/24 were reviewed. No baseline dose adjustments for venetoclax  required at this time.   Venclexta  will be administered outpatient based on tumor lysis syndrome (TLS) risk assessment to be low risk. Patient aware of importance to increase hydration while on Venclexta . Labs during the initial 4-week ramp up phase will need to be scheduled closer to patient's start date of Venetoclax . Specifically patient will need labs: 07/15/24 AM, 07/15/24 PM, 07/16/24 AM, 07/22/24 AM, 07/22/24 PM, 07/23/24 AM, 07/29/24 AM, 08/05/24 AM  Week 1: Patient will take Venclexta  10mg  tablets, 1 tablet (10 mg) by mouth once daily with food and water  for 7 days. Week 2: Patient will take Venclexta  10mg  tablets, 2 tablets (20 mg) by mouth once daily with  food and water  for 7 days. Week 3: Patient will take Venclexta  50mg  tablets, 1 tablet (50 mg) by mouth once daily with food and water  for 7 days. Week 4: Patient will take Venclexta  100mg  tablets, 1 tablet (100 mg) by mouth once daily with food and water  for 7 days.  If patient tolerates 100 mg daily, he may be dose-escalated up to goal dose of 200 mg daily per MD discretion.   Patient knows to avoid grapefruit, grapefruit juice, seville oranges and star fruit while on Venclexta .  Anticipated start date of Venetoclax : 07/15/24 (C1D22)  Treatment goal: Palliative  Side Effects: Side effects include but not limited to: TLS, decreased blood counts, electrolyte abnormalities, diarrhea, nausea, fatigue, arthralgias/myalgias  Supportive Care Medications: TLS PPX: Patient has been prescribed allopurinol  300 mg daily for TLS  prophylaxis. Patient is also aware they will have increased lab monitoring during their ramp-up phase of venetoclax .  VZV PPX: Patient will take acyclovir  400 mg daily Nausea/Vomiting: Patient has anti-emetic on hand and knows to take it if nausea develops.  Diarrhea: Patient will obtain anti diarrheal and alert the office of 4 or more loose stools above baseline.  Drug-drug Interactions (DDI):  Venetoclax  dose has been reduced 50% due to patient being on carvedilol .  Medication reconciliation performed and medication/allergy list updated.  Adherence: Reviewed with patient importance of keeping a medication schedule and plan for any missed doses. No barriers to medication adherence identified.   Medication Access Issues:  Will plan for [facility] specialty pharmacy to ship patient's first 2 weeks of venetoclax  to patient's home the week of 07/05/24 as patient is not starting therapy right away. Copay for venetoclax  is $0. Patient's supportive care medications have been sent to [facility] mail order pharmacy - I will follow up with their pharmacy as patient states he has not received any  of these medications yet (prescriptions were sent to [facility] on 06/10/24)  Distress thermometer: Distress thermometer not completed during in person visit as patient has been on previous lines of therapy.   Communication and Learning Assessment Primary learner: Patient Barriers to learning: No barriers Preferred language: English Learning preferences: Listening Reading  Patient expressed understanding and was in agreement with this plan. He also understands that He can call clinic at any time with any questions, concerns, or complaints.   Thank you for allowing me to participate in the care of this very pleasant patient.   Asberry Macintosh, PharmD, BCPS, BCOP Hematology/Oncology Clinical Pharmacist [phone] 06/15/2024 2:03 PM

## 2024-06-16 ENCOUNTER — Other Ambulatory Visit: Payer: Self-pay | Admitting: Medical Oncology

## 2024-06-16 ENCOUNTER — Other Ambulatory Visit (HOSPITAL_COMMUNITY): Payer: Self-pay

## 2024-06-16 DIAGNOSIS — C9110 Chronic lymphocytic leukemia of B-cell type not having achieved remission: Secondary | ICD-10-CM

## 2024-06-16 MED ORDER — ONDANSETRON HCL 8 MG PO TABS
8.0000 mg | ORAL_TABLET | Freq: Three times a day (TID) | ORAL | 1 refills | Status: AC | PRN
  Filled 2024-06-16 – 2024-06-17 (×2): qty 30, 10d supply, fill #0

## 2024-06-16 MED ORDER — PROCHLORPERAZINE MALEATE 10 MG PO TABS
10.0000 mg | ORAL_TABLET | Freq: Four times a day (QID) | ORAL | 1 refills | Status: AC | PRN
  Filled 2024-06-16 – 2024-06-17 (×2): qty 30, 8d supply, fill #0

## 2024-06-16 MED ORDER — ALLOPURINOL 300 MG PO TABS
300.0000 mg | ORAL_TABLET | Freq: Every day | ORAL | 0 refills | Status: AC
  Filled 2024-06-16 – 2024-06-17 (×2): qty 30, 30d supply, fill #0

## 2024-06-16 MED ORDER — ACYCLOVIR 400 MG PO TABS
400.0000 mg | ORAL_TABLET | Freq: Every day | ORAL | 5 refills | Status: AC
  Filled 2024-06-16 – 2024-06-17 (×2): qty 30, 30d supply, fill #0
  Filled 2024-09-22 (×2): qty 30, 30d supply, fill #1

## 2024-06-16 MED ORDER — LIDOCAINE-PRILOCAINE 2.5-2.5 % EX CREA
TOPICAL_CREAM | CUTANEOUS | 3 refills | Status: DC
  Filled 2024-06-16: qty 30, 30d supply, fill #0

## 2024-06-16 MED ORDER — SULFAMETHOXAZOLE-TRIMETHOPRIM 800-160 MG PO TABS
1.0000 | ORAL_TABLET | ORAL | 5 refills | Status: DC
  Filled 2024-06-16 – 2024-06-17 (×2): qty 12, 28d supply, fill #0

## 2024-06-16 NOTE — Progress Notes (Signed)
 RXs re-routed to [facility].

## 2024-06-17 ENCOUNTER — Other Ambulatory Visit: Payer: Self-pay

## 2024-06-17 ENCOUNTER — Other Ambulatory Visit (HOSPITAL_COMMUNITY): Payer: Self-pay

## 2024-06-17 NOTE — Progress Notes (Signed)
 Pharmacist Chemotherapy Monitoring - Initial Assessment    Anticipated start date: 06/24/24   The following has been reviewed per standard work regarding the patient's treatment regimen: The patient's diagnosis, treatment plan and drug doses, and organ/hematologic function Lab orders and baseline tests specific to treatment regimen  The treatment plan start date, drug sequencing, and pre-medications Prior authorization status  Patient's documented medication list, including drug-drug interaction screen and prescriptions for anti-emetics and supportive care specific to the treatment regimen The drug concentrations, fluid compatibility, administration routes, and timing of the medications to be used The patient's access for treatment and lifetime cumulative dose history, if applicable  The patient's medication allergies and previous infusion related reactions, if applicable   Changes made to treatment plan:  N/A  Follow up needed:  N/A   Aaron Gonzalez, Pharm.D., CPP 06/17/2024@8 :30 AM

## 2024-06-23 MED FILL — Dexamethasone Sodium Phosphate Inj 100 MG/10ML: INTRAMUSCULAR | Qty: 2 | Status: AC

## 2024-06-24 ENCOUNTER — Inpatient Hospital Stay: Attending: Internal Medicine

## 2024-06-24 ENCOUNTER — Other Ambulatory Visit: Payer: Self-pay | Admitting: Physician Assistant

## 2024-06-24 ENCOUNTER — Inpatient Hospital Stay

## 2024-06-24 ENCOUNTER — Inpatient Hospital Stay (HOSPITAL_BASED_OUTPATIENT_CLINIC_OR_DEPARTMENT_OTHER): Admitting: Physician Assistant

## 2024-06-24 ENCOUNTER — Encounter: Payer: Self-pay | Admitting: Internal Medicine

## 2024-06-24 VITALS — BP 109/72 | HR 82 | Resp 20

## 2024-06-24 VITALS — BP 132/80 | HR 97 | Temp 98.4°F | Resp 24 | Ht 65.0 in | Wt 158.1 lb

## 2024-06-24 DIAGNOSIS — Z5111 Encounter for antineoplastic chemotherapy: Secondary | ICD-10-CM | POA: Diagnosis present

## 2024-06-24 DIAGNOSIS — C9110 Chronic lymphocytic leukemia of B-cell type not having achieved remission: Secondary | ICD-10-CM

## 2024-06-24 DIAGNOSIS — Z9221 Personal history of antineoplastic chemotherapy: Secondary | ICD-10-CM | POA: Diagnosis not present

## 2024-06-24 DIAGNOSIS — R519 Headache, unspecified: Secondary | ICD-10-CM

## 2024-06-24 DIAGNOSIS — Z933 Colostomy status: Secondary | ICD-10-CM | POA: Insufficient documentation

## 2024-06-24 DIAGNOSIS — R61 Generalized hyperhidrosis: Secondary | ICD-10-CM

## 2024-06-24 DIAGNOSIS — D696 Thrombocytopenia, unspecified: Secondary | ICD-10-CM | POA: Diagnosis not present

## 2024-06-24 DIAGNOSIS — T50905A Adverse effect of unspecified drugs, medicaments and biological substances, initial encounter: Secondary | ICD-10-CM

## 2024-06-24 DIAGNOSIS — Z79899 Other long term (current) drug therapy: Secondary | ICD-10-CM | POA: Insufficient documentation

## 2024-06-24 DIAGNOSIS — R112 Nausea with vomiting, unspecified: Secondary | ICD-10-CM

## 2024-06-24 DIAGNOSIS — Z85048 Personal history of other malignant neoplasm of rectum, rectosigmoid junction, and anus: Secondary | ICD-10-CM | POA: Insufficient documentation

## 2024-06-24 DIAGNOSIS — T451X5A Adverse effect of antineoplastic and immunosuppressive drugs, initial encounter: Secondary | ICD-10-CM | POA: Diagnosis not present

## 2024-06-24 DIAGNOSIS — Z923 Personal history of irradiation: Secondary | ICD-10-CM | POA: Diagnosis not present

## 2024-06-24 LAB — CBC WITH DIFFERENTIAL (CANCER CENTER ONLY)
Basophils Absolute: 0 K/uL (ref 0.0–0.1)
Basophils Relative: 0 %
Eosinophils Absolute: 3 K/uL — ABNORMAL HIGH (ref 0.0–0.5)
Eosinophils Relative: 1 %
HCT: 34.6 % — ABNORMAL LOW (ref 39.0–52.0)
Hemoglobin: 11.3 g/dL — ABNORMAL LOW (ref 13.0–17.0)
Lymphocytes Relative: 95 %
Lymphs Abs: 287.1 K/uL — ABNORMAL HIGH (ref 0.7–4.0)
MCH: 31.1 pg (ref 26.0–34.0)
MCHC: 32.7 g/dL (ref 30.0–36.0)
MCV: 95.3 fL (ref 80.0–100.0)
Monocytes Absolute: 3 K/uL — ABNORMAL HIGH (ref 0.1–1.0)
Monocytes Relative: 1 %
Neutro Abs: 9.1 K/uL — ABNORMAL HIGH (ref 1.7–7.7)
Neutrophils Relative %: 3 %
Platelet Count: 83 K/uL — ABNORMAL LOW (ref 150–400)
RBC: 3.63 MIL/uL — ABNORMAL LOW (ref 4.22–5.81)
RDW: 16.1 % — ABNORMAL HIGH (ref 11.5–15.5)
Smear Review: NORMAL
WBC Count: 302.2 K/uL (ref 4.0–10.5)
nRBC: 0.2 % (ref 0.0–0.2)

## 2024-06-24 LAB — CMP (CANCER CENTER ONLY)
ALT: 37 U/L (ref 0–44)
AST: 64 U/L — ABNORMAL HIGH (ref 15–41)
Albumin: 4.2 g/dL (ref 3.5–5.0)
Alkaline Phosphatase: 96 U/L (ref 38–126)
BUN: 22 mg/dL (ref 8–23)
CO2: 44 mmol/L — ABNORMAL HIGH (ref 22–32)
Calcium: 9.6 mg/dL (ref 8.9–10.3)
Chloride: 107 mmol/L (ref 98–111)
Creatinine: 1.25 mg/dL — ABNORMAL HIGH (ref 0.61–1.24)
GFR, Estimated: 59 mL/min — ABNORMAL LOW (ref >60)
Glucose, Bld: 84 mg/dL (ref 70–99)
Potassium: 3.3 mmol/L — ABNORMAL LOW (ref 3.5–5.1)
Sodium: 144 mmol/L (ref 135–145)
Total Bilirubin: 2.6 mg/dL — ABNORMAL HIGH (ref 0.0–1.2)
Total Protein: 6.7 g/dL (ref 6.5–8.1)

## 2024-06-24 LAB — LACTATE DEHYDROGENASE: LDH: >2500 U/L — ABNORMAL HIGH (ref 98–192)

## 2024-06-24 LAB — URIC ACID: Uric Acid, Serum: 14.4 mg/dL — ABNORMAL HIGH (ref 3.7–8.6)

## 2024-06-24 MED ORDER — SODIUM CHLORIDE 0.9 % IV SOLN: Freq: Once | INTRAVENOUS | Status: DC | PRN

## 2024-06-24 MED ORDER — METHYLPREDNISOLONE SODIUM SUCC 125 MG IJ SOLR
125.0000 mg | Freq: Once | INTRAMUSCULAR | Status: AC | PRN
  Administered 2024-06-24: 125 mg via INTRAVENOUS

## 2024-06-24 MED ORDER — SODIUM CHLORIDE 0.9 % IV SOLN: INTRAVENOUS | Status: DC

## 2024-06-24 MED ORDER — ACETAMINOPHEN 325 MG PO TABS
650.0000 mg | ORAL_TABLET | Freq: Once | ORAL | Status: AC
  Administered 2024-06-24: 650 mg via ORAL
  Filled 2024-06-24: qty 2

## 2024-06-24 MED ORDER — SODIUM CHLORIDE 0.9 % IV SOLN
100.0000 mg | Freq: Once | INTRAVENOUS | Status: AC
  Administered 2024-06-24: 100 mg via INTRAVENOUS
  Filled 2024-06-24: qty 4

## 2024-06-24 MED ORDER — DIPHENHYDRAMINE HCL 50 MG/ML IJ SOLN
50.0000 mg | Freq: Once | INTRAMUSCULAR | Status: DC | PRN
Start: 2024-06-24 — End: 2024-06-24

## 2024-06-24 MED ORDER — SODIUM CHLORIDE 0.9 % IV SOLN
4.5000 mg | Freq: Once | INTRAVENOUS | Status: AC
  Administered 2024-06-24: 4.5 mg via INTRAVENOUS
  Filled 2024-06-24: qty 3

## 2024-06-24 MED ORDER — DIPHENHYDRAMINE HCL 50 MG/ML IJ SOLN
50.0000 mg | Freq: Once | INTRAMUSCULAR | Status: AC
  Administered 2024-06-24: 50 mg via INTRAVENOUS
  Filled 2024-06-24: qty 1

## 2024-06-24 MED ORDER — SODIUM CHLORIDE 0.9 % IV SOLN
20.0000 mg | Freq: Once | INTRAVENOUS | Status: AC
  Administered 2024-06-24: 20 mg via INTRAVENOUS
  Filled 2024-06-24: qty 20

## 2024-06-24 MED ORDER — CETIRIZINE HCL 10 MG/ML IV SOLN
10.0000 mg | Freq: Once | INTRAVENOUS | Status: AC
  Administered 2024-06-24: 10 mg via INTRAVENOUS
  Filled 2024-06-24: qty 1

## 2024-06-24 MED ORDER — ACETAMINOPHEN 325 MG PO TABS
ORAL_TABLET | ORAL | Status: AC
  Administered 2024-06-24: 650 mg
  Filled 2024-06-24: qty 2

## 2024-06-24 MED ORDER — FAMOTIDINE IN NACL 20-0.9 MG/50ML-% IV SOLN
20.0000 mg | Freq: Once | INTRAVENOUS | Status: AC | PRN
  Administered 2024-06-24: 20 mg via INTRAVENOUS

## 2024-06-24 MED FILL — Dexamethasone Sodium Phosphate Inj 100 MG/10ML: INTRAMUSCULAR | Qty: 2 | Status: AC

## 2024-06-24 NOTE — Progress Notes (Signed)
 CRITICAL VALUE STICKER  CRITICAL VALUE: WBC 302.2  RECEIVER (on-site recipient of call): Rosina   DATE & TIME NOTIFIED: 06/24/24 @ 9:08  MESSENGER (representative from lab): Harlene   MD NOTIFIED:  Dr. Sherrod   TIME OF NOTIFICATION: 9:21  RESPONSE: Per Dr. Sherrod, proceed with tx today

## 2024-06-24 NOTE — Progress Notes (Signed)
 Hypersensitivity Reaction note  Date of event: 06/24/24 Time of event: 1352 Generic name of drug involved: Obinituzumab Name of provider notified of the hypersensitivity reaction: Mallie Combes, PA Was agent that likely caused hypersensitivity reaction added to Allergies List within EMR? YES Chain of events including reaction signs/symptoms, treatment administered, and outcome (e.g., drug resumed; drug discontinued; sent to Emergency Department; etc.)   Approximately 1hr and 45 minutes into infusion pt stated feeling nauseous, diaphoretic and facial flushing. Stopped infusion, emergency protocol initiated. Started Normal saline bolus, and 20mg  Pepcid , IV given, Mallie PA at chairside for assessment/observation.  Solumedrol 125mg  given IV, pt then began to complain of headache 5/10 medicated with 650mg  tylenol  po. Headache did not subside and began to complain of lower back pain rated it 5/10. Medicated with cetirizine  10mg  IV. After observation , nausea resolved,  but pt headache did not resolve and declined further re-challenge of medication today. Per Mallie, [place] additional premedications to be added to treatment plan and pt to come back 06/25/24 for re-challenge. Pt Vital signs returned to baseline and per [place], [place] ok to discharge home to [facility]. Discharged via wheelchair with no respiratory distress noted.  Eean Buss E Abdullah Rizzi, RN 06/24/2024 4:35 PM

## 2024-06-24 NOTE — Progress Notes (Signed)
    DATE:  06/24/24                                        X CHEMO/IMMUNOTHERAPY REACTION             MD: Sherrod   AGENT/BLOOD PRODUCT RECEIVING TODAY:              Obinutuzumab    AGENT/BLOOD PRODUCT RECEIVING IMMEDIATELY PRIOR TO REACTION:          Obinutuzumab     Vitals:   06/24/24 1354 06/24/24 1358 06/24/24 1401  BP: 97/63 111/74 109/72  Pulse: 84 98 82  Resp: 20 20 20   SpO2: 95% 94% 95%      REACTION(S):           nausea, diaphoresis, headache   PREMEDS:     Decadron  20 mg IV, Benadryl  50 mg IV, tylenol  650 mg PO  INTERVENTION: Pepcid  20 mg IV, solu-medrol  125 mg IV, tylenol  650 mg PO, cetirizine  10 mg IV   Review of Systems  Review of Systems  Constitutional:  Positive for diaphoresis.  Gastrointestinal:  Positive for nausea and vomiting.  Neurological:  Positive for headaches.  All other systems reviewed and are negative.    Physical Exam  Physical Exam Vitals and nursing note reviewed.  Constitutional:      Appearance: He is ill-appearing. He is not toxic-appearing.  HENT:     Head: Normocephalic.  Eyes:     Conjunctiva/sclera: Conjunctivae normal.  Cardiovascular:     Rate and Rhythm: Normal rate and regular rhythm.     Pulses: Normal pulses.     Heart sounds: Normal heart sounds.  Pulmonary:     Effort: Pulmonary effort is normal.     Breath sounds: Normal breath sounds.  Abdominal:     General: There is no distension.  Musculoskeletal:     Cervical back: Normal range of motion.  Skin:    General: Skin is warm and dry.  Neurological:     Mental Status: He is alert.     OUTCOME:                  Patient became symptomatic approximately 2 hours into  first time Gazyvainfusion.   Emergency medications were administered as documented above. Patient feeling much improved although has low back pain. VSS. He prefers to discontinue treatment today and will return tomorrow for his scheduled appointment. Oncologist notified and agrees with plan.  Pharmacy will add pre medications for tomorrow's treatment.  I have spent a total of 10 minutes minutes of face-to-face and non-face-to-face time preparing to see the patient, performing a medically appropriate examination, counseling and educating the patient, ordering tests/procedures/medications, documenting clinical information in the electronic health record, and care coordination.

## 2024-06-24 NOTE — Progress Notes (Signed)
 Order entered for rasburicase  IV 4.5mg  for uric acid=14.4 per Dr. Sherrod. Will plan to recheck uric acid tomorrow.  Jeovanny Cuadros, PharmD, MBA

## 2024-06-24 NOTE — Patient Instructions (Addendum)
 [facility] - A DEPT OF [facility]  Discharge Instructions: Thank you for choosing [facility] to provide your oncology and hematology care.   If you have a lab appointment with the Cancer Center, please go directly to the Cancer Center and check in at the registration area.   Wear comfortable clothing and clothing appropriate for easy access to any Portacath or PICC line.   We strive to give you quality time with your provider. You may need to reschedule your appointment if you arrive late (15 or more minutes).  Arriving late affects you and other patients whose appointments are after yours.  Also, if you miss three or more appointments without notifying the office, you may be dismissed from the clinic at the provider's discretion.      For prescription refill requests, have your pharmacy contact our office and allow 72 hours for refills to be completed.    Today you received the following chemotherapy and/or immunotherapy agents: Obinutuzumab  (gazyva )    To help prevent nausea and vomiting after your treatment, we encourage you to take your nausea medication as directed.  BELOW ARE SYMPTOMS THAT SHOULD BE REPORTED IMMEDIATELY: *FEVER GREATER THAN 100.4 F (38 C) OR HIGHER *CHILLS OR SWEATING *NAUSEA AND VOMITING THAT IS NOT CONTROLLED WITH YOUR NAUSEA MEDICATION *UNUSUAL SHORTNESS OF BREATH *UNUSUAL BRUISING OR BLEEDING *URINARY PROBLEMS (pain or burning when urinating, or frequent urination) *BOWEL PROBLEMS (unusual diarrhea, constipation, pain near the anus) TENDERNESS IN MOUTH AND THROAT WITH OR WITHOUT PRESENCE OF ULCERS (sore throat, sores in mouth, or a toothache) UNUSUAL RASH, SWELLING OR PAIN  UNUSUAL VAGINAL DISCHARGE OR ITCHING   Items with * indicate a potential emergency and should be followed up as soon as possible or go to the Emergency Department if any problems should occur.  Please show the CHEMOTHERAPY ALERT CARD or  IMMUNOTHERAPY ALERT CARD at check-in to the Emergency Department and triage nurse.  Should you have questions after your visit or need to cancel or reschedule your appointment, please contact [facility]  Dept: [phone]  and follow the prompts.  Office hours are 8:00 a.m. to 4:30 p.m. Monday - Friday. Please note that voicemails left after 4:00 p.m. may not be returned until the following business day.  We are closed weekends and major holidays. You have access to a nurse at all times for urgent questions. Please call the main number to the clinic Dept: [phone] and follow the prompts.   For any non-urgent questions, you may also contact your provider using MyChart. We now offer e-Visits for anyone 58 and older to request care online for non-urgent symptoms. For details visit [email or web address].   Also download the MyChart app! Go to the app store, search MyChart, open the app, select [place], and log in with your MyChart username and password.  Obinutuzumab  Injection What is this medication? OBINUTUZUMAB  (OH bi nue TOOZ ue mab) treats leukemia and lymphoma. It works by blocking a protein that causes cancer cells to grow and multiply. This helps to slow or stop the spread of cancer cells. It is a monoclonal antibody. This medicine may be used for other purposes; ask your health care provider or pharmacist if you have questions. COMMON BRAND NAME(S): GAZYVA  What should I tell my care team before I take this medication? They need to know if you have any of these conditions:  Heart disease Infection, especially a viral infection, such as hepatitis B Lung or breathing disease Take medications that treat or prevent blood clots An unusual or allergic reaction to obinutuzumab , other medications, foods, dyes, or preservatives Pregnant or trying to get pregnant Breastfeeding How should I use this medication? This medication is  for infusion into a vein. It is given by a care team in a hospital or clinic setting. Talk to your care team about the use of this medication in children. Special care may be needed. Overdosage: If you think you have taken too much of this medicine contact a poison control center or emergency room at once. NOTE: This medicine is only for you. Do not share this medicine with others. What if I miss a dose? Keep appointments for follow-up doses as directed. It is important not to miss your dose. Call your care team if you are unable to keep an appointment. What may interact with this medication? Live virus vaccines This list may not describe all possible interactions. Give your health care provider a list of all the medicines, herbs, non-prescription drugs, or dietary supplements you use. Also tell them if you smoke, drink alcohol, or use illegal drugs. Some items may interact with your medicine. What should I watch for while using this medication? Report any side effects that you notice during your treatment right away, such as changes in your breathing, fever, chills, dizziness or lightheadedness. These effects are more common with the first dose. Visit your care team for checks on your progress. You will need to have regular blood work. Report any other side effects. The side effects of this medication can continue after you finish your treatment. Continue your course of treatment even though you feel ill unless your care team tells you to stop. Call your care team for advice if you get a fever, chills or sore throat, or other symptoms of a cold or flu. Do not treat yourself. This medication decreases your body's ability to fight infections. Try to avoid being around people who are sick. This medication may increase your risk to bruise or bleed. Call your care team if you notice any unusual bleeding. Do not become pregnant while taking this medication or for 6 months after stopping it. Inform your care  team if you wish to become pregnant or think you might be pregnant. There is a potential for serious side effects to an unborn child. Talk to your care team or pharmacist for more information. Do not breast-feed an infant while taking this medication or for 6 months after stopping it. What side effects may I notice from receiving this medication? Side effects that you should report to your care team as soon as possible: Allergic reactions--skin rash, itching, hives, swelling of the face, lips, tongue, or throat Bleeding--bloody or black, tar-like stools, vomiting blood or brown material that looks like coffee grounds, red or dark brown urine, small red or purple spots on skin, unusual bruising or bleeding Blood clot--pain, swelling, or warmth in the leg, shortness of breath, chest pain Dizziness, loss of balance or coordination, confusion or trouble speaking Infection--fever, chills, cough, sore throat, wounds that don't heal, pain or trouble when passing urine, general feeling of discomfort or being unwell Infusion reactions--chest pain, shortness of breath or trouble breathing, feeling faint or lightheaded Liver injury--right upper belly pain, loss of appetite, nausea, light-colored stool, dark yellow or brown urine, yellowing skin or eyes, unusual weakness or fatigue Tumor lysis syndrome (TLS)--nausea, vomiting, diarrhea, decrease in  the amount of urine, dark urine, unusual weakness or fatigue, confusion, muscle pain or cramps, fast or irregular heartbeat, joint pain Side effects that usually do not require medical attention (report to your care team if they continue or are bothersome): Bone, joint, or muscle pain Constipation Diarrhea Fatigue Runny or stuffy nose Sore throat This list may not describe all possible side effects. Call your doctor for medical advice about side effects. You may report side effects to FDA at 1-800-FDA-1088. Where should I keep my medication? This medication is  only given in a hospital or clinic and will not be stored at home. NOTE: This sheet is a summary. It may not cover all possible information. If you have questions about this medicine, talk to your doctor, pharmacist, or health care provider.  2024 Elsevier/Gold Standard (2022-02-27 00:00:00) Rasburicase  Injection What is this medication? RASBURICASE  (ras BURE i kase) prevents and treats high uric acid levels after chemotherapy. It works by decreasing uric acid levels in your body. This medicine may be used for other purposes; ask your health care provider or pharmacist if you have questions. COMMON BRAND NAME(S): Elitek  What should I tell my care team before I take this medication? They need to know if you have any of these conditions: G6PD deficiency An unusual or allergic reaction to rasburicase , other medications, foods, dyes, or preservatives Pregnant or trying to get pregnant Breastfeeding How should I use this medication? This medication is infused into a vein. It is given by your care team in a hospital or clinic setting. Talk to your care team about the use of this medication in children. While it may be given to children as young as 1 month for selected conditions, precautions do apply. Overdosage: If you think you have taken too much of this medicine contact a poison control center or emergency room at once. NOTE: This medicine is only for you. Do not share this medicine with others. What if I miss a dose? This does not apply. What may interact with this medication? Interactions are not expected. This list may not describe all possible interactions. Give your health care provider a list of all the medicines, herbs, non-prescription drugs, or dietary supplements you use. Also tell them if you smoke, drink alcohol, or use illegal drugs. Some items may interact with your medicine. What should I watch for while using this medication? Your condition will be monitored carefully while  you are receiving this medication. Do not breastfeed while taking this medication and for at least 2 weeks after the last dose. What side effects may I notice from receiving this medication? Side effects that you should report to your care team as soon as possible: Allergic reactions--skin rash, itching, hives, swelling of the face, lips, tongue, or throat Headache, unusual weakness or fatigue, shortness of breath, nausea, vomiting, rapid heartbeat, blue skin or lips, which may be signs of methemoglobinemia Side effects that usually do not require medical attention (report these to your care team if they continue or are bothersome): Anxiety, nervousness Nausea Stomach pain Swelling of the ankles, hands, or feet Vomiting This list may not describe all possible side effects. Call your doctor for medical advice about side effects. You may report side effects to FDA at 1-800-FDA-1088. Where should I keep my medication? This medication is given in a hospital or clinic. It will not be stored at home. NOTE: This sheet is a summary. It may not cover all possible information. If you have questions about this medicine, talk  to your doctor, pharmacist, or health care provider.  2024 Elsevier/Gold Standard (2023-09-19 00:00:00)

## 2024-06-25 ENCOUNTER — Encounter: Payer: Self-pay | Admitting: Internal Medicine

## 2024-06-25 ENCOUNTER — Telehealth: Payer: Self-pay

## 2024-06-25 ENCOUNTER — Other Ambulatory Visit: Payer: Self-pay

## 2024-06-25 ENCOUNTER — Inpatient Hospital Stay

## 2024-06-25 ENCOUNTER — Telehealth: Payer: Self-pay | Admitting: Physician Assistant

## 2024-06-25 ENCOUNTER — Other Ambulatory Visit: Payer: Self-pay | Admitting: Physician Assistant

## 2024-06-25 VITALS — BP 164/94 | HR 98 | Temp 99.0°F | Resp 19

## 2024-06-25 DIAGNOSIS — Z5111 Encounter for antineoplastic chemotherapy: Secondary | ICD-10-CM | POA: Diagnosis not present

## 2024-06-25 DIAGNOSIS — C9110 Chronic lymphocytic leukemia of B-cell type not having achieved remission: Secondary | ICD-10-CM

## 2024-06-25 LAB — CBC WITH DIFFERENTIAL (CANCER CENTER ONLY)
Basophils Absolute: 0 K/uL (ref 0.0–0.1)
Basophils Relative: 0 %
Eosinophils Absolute: 0 K/uL (ref 0.0–0.5)
Eosinophils Relative: 0 %
HCT: 33.4 % — ABNORMAL LOW (ref 39.0–52.0)
Hemoglobin: 11.3 g/dL — ABNORMAL LOW (ref 13.0–17.0)
Lymphocytes Relative: 93 %
Lymphs Abs: 251.9 K/uL — ABNORMAL HIGH (ref 0.7–4.0)
MCH: 31.7 pg (ref 26.0–34.0)
MCHC: 33.8 g/dL (ref 30.0–36.0)
MCV: 93.8 fL (ref 80.0–100.0)
Monocytes Absolute: 5.4 K/uL — ABNORMAL HIGH (ref 0.1–1.0)
Monocytes Relative: 2 %
Neutro Abs: 13.5 K/uL — ABNORMAL HIGH (ref 1.7–7.7)
Neutrophils Relative %: 5 %
Platelet Count: 43 K/uL — ABNORMAL LOW (ref 150–400)
RBC: 3.56 MIL/uL — ABNORMAL LOW (ref 4.22–5.81)
RDW: 16 % — ABNORMAL HIGH (ref 11.5–15.5)
Smear Review: NORMAL
WBC Count: 270.9 K/uL (ref 4.0–10.5)
nRBC: 0.1 % (ref 0.0–0.2)

## 2024-06-25 LAB — CMP (CANCER CENTER ONLY)
ALT: 36 U/L (ref 0–44)
AST: 87 U/L — ABNORMAL HIGH (ref 15–41)
Albumin: 4.4 g/dL (ref 3.5–5.0)
Alkaline Phosphatase: 78 U/L (ref 38–126)
Anion gap: <0 — ABNORMAL LOW (ref 5–15)
BUN: 43 mg/dL — ABNORMAL HIGH (ref 8–23)
CO2: 45 mmol/L — ABNORMAL HIGH (ref 22–32)
Calcium: 9.2 mg/dL (ref 8.9–10.3)
Chloride: 112 mmol/L — ABNORMAL HIGH (ref 98–111)
Creatinine: 1.21 mg/dL (ref 0.61–1.24)
GFR, Estimated: >60 mL/min
Glucose, Bld: 130 mg/dL — ABNORMAL HIGH (ref 70–99)
Potassium: 4 mmol/L (ref 3.5–5.1)
Sodium: 146 mmol/L — ABNORMAL HIGH (ref 135–145)
Total Bilirubin: 5.2 mg/dL (ref 0.0–1.2)
Total Protein: 6.6 g/dL (ref 6.5–8.1)

## 2024-06-25 LAB — URIC ACID: Uric Acid, Serum: 5.5 mg/dL (ref 3.7–8.6)

## 2024-06-25 LAB — LACTATE DEHYDROGENASE: LDH: >2500 U/L — ABNORMAL HIGH (ref 98–192)

## 2024-06-25 MED ORDER — SODIUM CHLORIDE 0.9 % IV SOLN
100.0000 mg | Freq: Once | INTRAVENOUS | Status: AC
  Administered 2024-06-25: 100 mg via INTRAVENOUS
  Filled 2024-06-25: qty 4

## 2024-06-25 MED ORDER — DIPHENHYDRAMINE HCL 50 MG/ML IJ SOLN
50.0000 mg | Freq: Once | INTRAMUSCULAR | Status: AC
  Administered 2024-06-25: 50 mg via INTRAVENOUS
  Filled 2024-06-25: qty 1

## 2024-06-25 MED ORDER — FAMOTIDINE IN NACL 20-0.9 MG/50ML-% IV SOLN
20.0000 mg | Freq: Once | INTRAVENOUS | Status: AC
  Administered 2024-06-25: 20 mg via INTRAVENOUS
  Filled 2024-06-25: qty 50

## 2024-06-25 MED ORDER — ACETAMINOPHEN 325 MG PO TABS
650.0000 mg | ORAL_TABLET | Freq: Once | ORAL | Status: AC
  Administered 2024-06-25: 650 mg via ORAL
  Filled 2024-06-25: qty 2

## 2024-06-25 MED ORDER — SODIUM CHLORIDE 0.9 % IV SOLN: INTRAVENOUS | Status: DC

## 2024-06-25 MED ORDER — SODIUM CHLORIDE 0.9 % IV SOLN
20.0000 mg | Freq: Once | INTRAVENOUS | Status: AC
  Administered 2024-06-25: 20 mg via INTRAVENOUS
  Filled 2024-06-25: qty 2

## 2024-06-25 NOTE — Progress Notes (Signed)
 Okay per Cassie, PA to proceed with treatment utilizing yesterday's (9/4) CMP and CBC.  Harlene Nasuti, PharmD Oncology Infusion Pharmacist 06/25/2024 10:49 AM

## 2024-06-25 NOTE — Patient Instructions (Signed)
 [facility] - A DEPT OF [place]  Discharge Instructions: Thank you for choosing [facility] to provide your oncology and hematology care.   If you have a lab appointment with the Cancer Center, please go directly to the Cancer Center and check in at the registration area.   Wear comfortable clothing and clothing appropriate for easy access to any Portacath or PICC line.   We strive to give you quality time with your provider. You may need to reschedule your appointment if you arrive late (15 or more minutes).  Arriving late affects you and other patients whose appointments are after yours.  Also, if you miss three or more appointments without notifying the office, you may be dismissed from the clinic at the provider's discretion.      For prescription refill requests, have your pharmacy contact our office and allow 72 hours for refills to be completed.    Today you received the following chemotherapy and/or immunotherapy agents: obinutuzumab  (GAZYVA )    To help prevent nausea and vomiting after your treatment, we encourage you to take your nausea medication as directed.  BELOW ARE SYMPTOMS THAT SHOULD BE REPORTED IMMEDIATELY: *FEVER GREATER THAN 100.4 F (38 C) OR HIGHER *CHILLS OR SWEATING *NAUSEA AND VOMITING THAT IS NOT CONTROLLED WITH YOUR NAUSEA MEDICATION *UNUSUAL SHORTNESS OF BREATH *UNUSUAL BRUISING OR BLEEDING *URINARY PROBLEMS (pain or burning when urinating, or frequent urination) *BOWEL PROBLEMS (unusual diarrhea, constipation, pain near the anus) TENDERNESS IN MOUTH AND THROAT WITH OR WITHOUT PRESENCE OF ULCERS (sore throat, sores in mouth, or a toothache) UNUSUAL RASH, SWELLING OR PAIN  UNUSUAL VAGINAL DISCHARGE OR ITCHING   Items with * indicate a potential emergency and should be followed up as soon as possible or go to the Emergency Department if any problems should occur.  Please show the CHEMOTHERAPY ALERT CARD or  IMMUNOTHERAPY ALERT CARD at check-in to the Emergency Department and triage nurse.  Should you have questions after your visit or need to cancel or reschedule your appointment, please contact [facility]  Dept: [phone]  and follow the prompts.  Office hours are 8:00 a.m. to 4:30 p.m. Monday - Friday. Please note that voicemails left after 4:00 p.m. may not be returned until the following business day.  We are closed weekends and major holidays. You have access to a nurse at all times for urgent questions. Please call the main number to the clinic Dept: [phone] and follow the prompts.   For any non-urgent questions, you may also contact your provider using MyChart. We now offer e-Visits for anyone 71 and older to request care online for non-urgent symptoms. For details visit [email or web address].   Also download the MyChart app! Go to the app store, search MyChart, open the app, select [place], and log in with your MyChart username and password.

## 2024-06-25 NOTE — Progress Notes (Signed)
 CRITICAL VALUE STICKER  CRITICAL VALUE: Bilirubin 5.2, WBC 270.9  RECEIVER (on-site recipient of call): [facility]   DATE & TIME NOTIFIED: 06/25/24 @ 11  MESSENGER (representative from lab): Hadassah armin Guillaume   MD NOTIFIED: Dr. Sherrod   TIME OF NOTIFICATION: 11:15  RESPONSE: Per Dr. Sherrod, patient to proceed with treatment today because labs are due to progressive disease.

## 2024-06-25 NOTE — Telephone Encounter (Signed)
 Was informed that shortly after completing treatment in the clinic, the patient experienced a brief episode of confusion. The etiology is unclear but may be related to premedications or age-related factors. 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   Laboratory results obtained today were notably abnormal; this has been communicated to the supervising physician and is consistent with the patient's progressive disease status, which prompted initiation of treatment during this visit.  I attempted to contact/call the patient to follow up with him after his infusion today but was unable to reach him. As we approach the weekend, I advised that the patient should have a low threshold for seeking emergency evaluation if any new or concerning symptoms develop. Otherwise, we are scheduled to see him next week on 07/01/24 for his next appointment.

## 2024-06-25 NOTE — Telephone Encounter (Signed)
 Called Lauretta, Horald's sister, about his ride home and she said to call her daughter Lauree. Called Ayana and she is sending someone to pick him up. Andrea CHRISTELLA Plunk, RN

## 2024-06-25 NOTE — Progress Notes (Signed)
 Prior to tx ending within 10-15 mL of therapy left, pt became increasingly confused. Pt was standing and refusing to sit back down and attempting to remove his IV while tx was still running. Patient stated that he had called his ride already. This RN notified Andrea, RN charge and [place], [place] for assistance to assess patients confusion. Patients therapy was stopped and IV was flushed and IV was removed by Selinda, RN.

## 2024-06-25 NOTE — Progress Notes (Signed)
 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  Helyne Genther, PharmD, MBA

## 2024-06-28 ENCOUNTER — Encounter: Payer: Self-pay | Admitting: Internal Medicine

## 2024-06-29 ENCOUNTER — Other Ambulatory Visit: Payer: Self-pay | Admitting: Physician Assistant

## 2024-06-29 NOTE — Progress Notes (Unsigned)
 [facility] OFFICE PROGRESS NOTE  [name], MD [address] [place] [address]  DIAGNOSIS:  1) History of rectal cancer in 2007 status post concurrent chemoradiation followed by surgery. 2) Chronic lymphocytic leukemia diagnosed in 2007.   PRIOR THERAPY: 1) Ibrutinib  420 mg p.o. daily first 2007 and discontinued August 26, 2023 secondary to disease progression. 2) Acalabrutinib  100 mg p.o. twice daily.  First dose started August 28, 2023.  This discontinued and August 2025 secondary to disease progression and rising total white blood count with anemia and thrombocytopenia.  CURRENT THERAPY:  Infusional obinutuzumab  in addition to venetoclax .  First dose on 06/25/24. Venetoclax  is expected to start on 07/15/24  INTERVAL HISTORY: Aaron Gonzalez 78 y.o. male returns to the clinic today for a follow-up visit.  The patient was last seen in clinic by Dr. Sherrod on 06/10/2024.  The patient unfortunately was found to have disease progression with his chronic lymphocytic leukemia.  Therefore, he was started on treatment with obinutuzumab  last week on 06/25/2024.  He is expected to start venetoclax  in a few weeks.  The patient was having significant lab abnormalities at his last appointment. He denies any abnormal bleeding or bruising.  He denies any signs and symptoms of infection including fever, nasal congestion, sore throat, skin infections, burning with urination, dysuria, diarrhea, or abdominal pain.  He denies any shortness of breath, night sweats, chest pain, unexplained weight loss, or lymphadenopathy.  After his infusion last week he had a brief transient episode of confusion which initially was thought to be secondary to his premedications which was adjusted moving forward to be cetirizine . He did have some increased sensation of anxiety with his last appointment which may be due to steroids. He has a history of leg swelling, which is typical for him. The swelling improves  with leg elevation but recurs upon standing. He states it is baseline today. He is here today for evaluation and repeat blood work before undergoing day 8 cycle #1 tomorrow as scheduled.     MEDICAL HISTORY: Past Medical History:  Diagnosis Date   Arthritis    CLL (chronic lymphocytic leukemia) (HCC)    DVT (deep venous thrombosis) (HCC)    History of rectal cancer    Hypertension    Stroke (HCC)     ALLERGIES:  is allergic to gazyva  [obinutuzumab ].  MEDICATIONS:  Current Outpatient Medications  Medication Sig Dispense Refill   acyclovir  (ZOVIRAX ) 400 MG tablet Take 1 tablet (400 mg total) by mouth daily. 30 tablet 5   allopurinol  (ZYLOPRIM ) 300 MG tablet Take 1 tablet (300 mg total) by mouth daily. 30 tablet 0   amLODipine  (NORVASC ) 10 MG tablet Take 1 tablet by mouth daily.     blood glucose meter kit and supplies KIT Dispense based on patient and insurance preference. Use up to four times daily as directed. 1 each 0   carvedilol  (COREG ) 6.25 MG tablet TAKE 1 TABLET(6.25 MG) BY MOUTH TWICE DAILY 180 tablet 3   cyclobenzaprine  (FLEXERIL ) 10 MG tablet Take 1 tablet (10 mg total) by mouth 3 (three) times daily as needed for muscle spasms. 30 tablet 2   meloxicam  (MOBIC ) 15 MG tablet TAKE 1 TABLET(15 MG) BY MOUTH DAILY 30 tablet 2   ondansetron  (ZOFRAN ) 8 MG tablet Take 1 tablet (8 mg total) by mouth every 8 (eight) hours as needed for nausea or vomiting. 30 tablet 1   prochlorperazine  (COMPAZINE ) 10 MG tablet Take 1 tablet (10 mg total)  by mouth every 6 (six) hours as needed for nausea or vomiting. 30 tablet 1   sulfamethoxazole -trimethoprim  (BACTRIM  DS) 800-160 MG tablet Take 1 tablet by mouth 3 (three) times a week. 12 tablet 5   venetoclax  (VENCLEXTA ) 10 MG tablet Take 1 tablet (10 mg total) by mouth daily for first week of treatment, then increase to 2 tablets (20 mg total) by mouth daily for second week of treatment.Take with food and water . Start: 07/15/24 21 tablet 0   venetoclax   (VENCLEXTA ) 100 MG tablet Take 1 tablet (100 mg total) by mouth daily for fourth week of treatment. Take with food and water . Start 08/05/24 7 tablet 0   venetoclax  (VENCLEXTA ) 50 MG tablet Take 1 tablet (50 mg total) by mouth daily for third week of treatment. Take with food and water . Start 07/29/24 7 tablet 0   No current facility-administered medications for this visit.    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   Procedure: Left TOTAL HIP ARTHROPLASTY ANTERIOR APPROACH;  Surgeon: Vernetta Lonni GRADE, MD;  Location: [facility];  Service: Orthopedics;  Laterality: Left;  RNFA    REVIEW OF SYSTEMS:   Review of Systems  Constitutional: Negative for appetite change, chills, fatigue, fever and unexpected weight change.  HENT: Negative for mouth sores, nosebleeds, sore throat and trouble swallowing.   Eyes: Negative for eye problems and icterus.  Respiratory: Negative for cough, hemoptysis, shortness of breath and wheezing.   Cardiovascular: Negative for chest pain. Positive for leg swelling bilaterally.  Gastrointestinal: Negative for abdominal pain, constipation, diarrhea, nausea and vomiting.  Genitourinary: Negative for bladder incontinence, difficulty urinating, dysuria, frequency and hematuria.   Musculoskeletal: Negative for back pain, gait problem, neck pain and neck stiffness.  Skin: Negative for itching and rash.  Neurological: Negative for dizziness, extremity weakness, gait problem, headaches, light-headedness and seizures.  Hematological: Negative for adenopathy. Does not bruise/bleed easily.  Psychiatric/Behavioral: Negative for confusion, depression and sleep disturbance. The patient is not nervous/anxious.     PHYSICAL EXAMINATION:  Blood pressure (!) 145/90, pulse 83, temperature 98.2 F (36.8 C), temperature source Tympanic, resp. rate 18, weight 160 lb (72.6 kg), SpO2  95%.  ECOG PERFORMANCE STATUS: 1  Physical Exam  Constitutional: Oriented to person, place, and time and well-developed, well-nourished, and in no distress.  HENT:  Head: Normocephalic and atraumatic.  Mouth/Throat: Oropharynx is clear and moist. No oropharyngeal exudate.  Eyes: Conjunctivae are normal. Right eye exhibits no discharge. Left eye exhibits no discharge. No scleral icterus.  Neck: Normal range of motion. Neck supple.  Cardiovascular: Normal rate, regular rhythm, normal heart sounds and intact distal pulses.   Pulmonary/Chest: Effort normal and breath sounds normal. No respiratory distress. No wheezes. No rales.  Abdominal: Soft. Bowel sounds are normal. Exhibits no distension and no mass. There is no tenderness.  Musculoskeletal: Normal range of motion. Leg swelling bilaterally.  Lymphadenopathy:    No cervical adenopathy.  Neurological: Alert and oriented to person, place, and time. Exhibits normal muscle tone. Gait normal. Coordination normal.  Skin: Skin is warm and dry. No rash noted. Not diaphoretic. No erythema. No pallor.  Psychiatric: Mood, memory and judgment normal.  Vitals reviewed.  LABORATORY DATA: Lab Results  Component Value Date   WBC 132.3 (HH) 07/01/2024   HGB 10.9 (L) 07/01/2024   HCT 33.1 (L) 07/01/2024   MCV 96.5 07/01/2024   PLT 92 (L)  07/01/2024      Chemistry      Component Value Date/Time   NA 141 07/01/2024 1454   NA 139 06/28/2021 0954   K 3.6 07/01/2024 1454   CL 108 07/01/2024 1454   CO2 40 (H) 07/01/2024 1454   BUN 15 07/01/2024 1454   BUN 11 06/28/2021 0954   CREATININE 1.06 07/01/2024 1454      Component Value Date/Time   CALCIUM  8.6 (L) 07/01/2024 1454   ALKPHOS 73 07/01/2024 1454   AST 47 (H) 07/01/2024 1454   ALT 36 07/01/2024 1454   BILITOT 1.2 07/01/2024 1454       RADIOGRAPHIC STUDIES:  No results found.   ASSESSMENT/PLAN:  This is a very pleasant 78 year old African-American male with a history of rectal  cancer in 2007. He is status post concurrent chemoradiation followed by surgical resection. At the same time he was diagnosed with chronic lymphocytic leukemia and he has been on observation initially for several years which was started on treatment with ibrutinib  420 mg in 2019.   He was found to have evidence of disease progression in early November 2024. Therefore he was started on acalabrutinib  around 08/28/2023.  This was discontinued in August 2025 due to disease progression.  Therefore Dr. Sherrod started the patient on treatment with venetoclax  and Obinutuzumab .  His first ifusion was on 06/25/24. He is expected to start venetoclax  around 07/15/24. However, we will decide closer to this date about the official start date. Per chart review, his venetoclax   is supposed to be shipped out next week.   Labs were reviewed. The patient was seen with Dr. Sherrod. His labs have improved since starting treatment last week.   His WBC is 132.3 and his HBG is 10.9 and his platelet count is 92k. He is alright to treat tomorrow with his labs and vitals today.  Recommend that he proceed with day 8 cycle 1 tomorrow scheduled. The patient was unable to get day 2 cycle 1 due to the clinic being closed and with his day 1 cycle 1 being delayed secondary to a reaction. Pepcid  was added to the premedications. Additionally, his premeds with benadryl  were changed to cetrizine.   The patient will be seen next week on the 19th and Dr. Sherrod will determine if he is okay to start his venetoclax  on the 25th of this month or if he wants to delay this further.  Peripheral edema - Advise wearing compression stockings during the day. - Advised to elevate   The patient was advised to call immediately if he has any concerning symptoms in the interval. The patient voices understanding of current disease status and treatment options and is in agreement with the current care plan. All questions were answered. The patient knows to  call the clinic with any problems, questions or concerns. We can certainly see the patient much sooner if necessary     No orders of the defined types were placed in this encounter.     Savana Spina L Bracha Frankowski, PA-C 07/01/24  ADDENDUM: Hematology/Oncology Attending: I had a face-to-face encounter with the patient today.  I reviewed his record, lab and recommended his care plan.  This is a very pleasant 78 years old African-American male with chronic lymphocytic leukemia diagnosed in 2017 in addition to history of early-stage rectal cancer status post concurrent chemoradiation at that time in 2007.  The patient was treated with ibrutinib  from 2007 until November 2024 and this was discontinued secondary to disease progression.  He was  then started on treatment with acalabrutinib  between November 2024 until August 2025 discontinued secondary to disease progression. He has significant disease progression and started treatment with obinutuzumab  and venetoclax  last week.  Repeat blood work today showed significant improvement in his blood count compared to last week. He will proceed with day 8 of cycle #1 with obinutuzumab  and venetoclax  tomorrow. The patient will continue with his prophylactic treatment with the allopurinol  as recommended. He will come back for follow-up visit next week for evaluation and repeat blood work. He was advised to call immediately if he has any other concerning symptoms in the interval. The total time spent in the appointment was 30 minutes including review of chart and various tests results, discussions about plan of care and coordination of care plan . Disclaimer: This note was dictated with voice recognition software. Similar sounding words can inadvertently be transcribed and may be missed upon review. Sherrod MARLA Sherrod, MD

## 2024-07-01 ENCOUNTER — Other Ambulatory Visit

## 2024-07-01 ENCOUNTER — Inpatient Hospital Stay: Admitting: Physician Assistant

## 2024-07-01 ENCOUNTER — Telehealth: Payer: Self-pay | Admitting: Medical Oncology

## 2024-07-01 VITALS — BP 145/90 | HR 83 | Temp 98.2°F | Resp 18 | Wt 160.0 lb

## 2024-07-01 DIAGNOSIS — Z5111 Encounter for antineoplastic chemotherapy: Secondary | ICD-10-CM | POA: Diagnosis not present

## 2024-07-01 DIAGNOSIS — C9110 Chronic lymphocytic leukemia of B-cell type not having achieved remission: Secondary | ICD-10-CM | POA: Diagnosis not present

## 2024-07-01 LAB — CBC WITH DIFFERENTIAL (CANCER CENTER ONLY)
Abs Immature Granulocytes: 1.3 K/uL — ABNORMAL HIGH (ref 0.00–0.07)
Basophils Absolute: 0 K/uL (ref 0.0–0.1)
Basophils Relative: 0 %
Eosinophils Absolute: 2.6 K/uL — ABNORMAL HIGH (ref 0.0–0.5)
Eosinophils Relative: 2 %
HCT: 33.1 % — ABNORMAL LOW (ref 39.0–52.0)
Hemoglobin: 10.9 g/dL — ABNORMAL LOW (ref 13.0–17.0)
Lymphocytes Relative: 87 %
Lymphs Abs: 115.1 K/uL — ABNORMAL HIGH (ref 0.7–4.0)
MCH: 31.8 pg (ref 26.0–34.0)
MCHC: 32.9 g/dL (ref 30.0–36.0)
MCV: 96.5 fL (ref 80.0–100.0)
Metamyelocytes Relative: 1 %
Monocytes Absolute: 2.6 K/uL — ABNORMAL HIGH (ref 0.1–1.0)
Monocytes Relative: 2 %
Neutro Abs: 10.6 K/uL — ABNORMAL HIGH (ref 1.7–7.7)
Neutrophils Relative %: 8 %
Platelet Count: 92 K/uL — ABNORMAL LOW (ref 150–400)
RBC: 3.43 MIL/uL — ABNORMAL LOW (ref 4.22–5.81)
RDW: 16.5 % — ABNORMAL HIGH (ref 11.5–15.5)
Smear Review: NORMAL
WBC Count: 132.3 K/uL (ref 4.0–10.5)
nRBC: 0.4 % — ABNORMAL HIGH (ref 0.0–0.2)

## 2024-07-01 LAB — CMP (CANCER CENTER ONLY)
ALT: 36 U/L (ref 0–44)
AST: 47 U/L — ABNORMAL HIGH (ref 15–41)
Albumin: 4.1 g/dL (ref 3.5–5.0)
Alkaline Phosphatase: 73 U/L (ref 38–126)
Anion gap: <0 — ABNORMAL LOW (ref 5–15)
BUN: 15 mg/dL (ref 8–23)
CO2: 40 mmol/L — ABNORMAL HIGH (ref 22–32)
Calcium: 8.6 mg/dL — ABNORMAL LOW (ref 8.9–10.3)
Chloride: 108 mmol/L (ref 98–111)
Creatinine: 1.06 mg/dL (ref 0.61–1.24)
GFR, Estimated: >60 mL/min (ref >60)
Glucose, Bld: 89 mg/dL (ref 70–99)
Potassium: 3.6 mmol/L (ref 3.5–5.1)
Sodium: 141 mmol/L (ref 135–145)
Total Bilirubin: 1.2 mg/dL (ref 0.0–1.2)
Total Protein: 6.1 g/dL — ABNORMAL LOW (ref 6.5–8.1)

## 2024-07-01 LAB — URIC ACID: Uric Acid, Serum: 8.4 mg/dL (ref 3.7–8.6)

## 2024-07-01 LAB — LACTATE DEHYDROGENASE: LDH: 4362 U/L — ABNORMAL HIGH (ref 98–192)

## 2024-07-01 MED FILL — Dexamethasone Sodium Phosphate Inj 100 MG/10ML: INTRAMUSCULAR | Qty: 2 | Status: AC

## 2024-07-01 NOTE — Telephone Encounter (Signed)
 CRITICAL VALUE STICKER  CRITICAL VALUE:WBC= 132.3 k  RECEIVER (on-site recipient of call):Deepti Gunawan, RN  DATE & TIME NOTIFIED: [phone] 07/01/2024  MESSENGER (representative from lab):Aldona  MD NOTIFIED: Sherrod  TIME OF NOTIFICATION:1715  RESPONSE:  Cassie Heilngoetter saw pt and his labs.

## 2024-07-02 ENCOUNTER — Inpatient Hospital Stay

## 2024-07-02 ENCOUNTER — Encounter: Payer: Self-pay | Admitting: Internal Medicine

## 2024-07-02 ENCOUNTER — Ambulatory Visit: Admitting: Physician Assistant

## 2024-07-02 VITALS — BP 129/90 | HR 94 | Resp 20

## 2024-07-02 VITALS — BP 131/91 | HR 99 | Temp 99.5°F | Resp 18

## 2024-07-02 DIAGNOSIS — T50905A Adverse effect of unspecified drugs, medicaments and biological substances, initial encounter: Secondary | ICD-10-CM

## 2024-07-02 DIAGNOSIS — Z5111 Encounter for antineoplastic chemotherapy: Secondary | ICD-10-CM | POA: Diagnosis not present

## 2024-07-02 DIAGNOSIS — C9110 Chronic lymphocytic leukemia of B-cell type not having achieved remission: Secondary | ICD-10-CM

## 2024-07-02 MED ORDER — SODIUM CHLORIDE 0.9 % IV SOLN: Freq: Once | INTRAVENOUS | Status: DC | PRN

## 2024-07-02 MED ORDER — FAMOTIDINE IN NACL 20-0.9 MG/50ML-% IV SOLN
20.0000 mg | Freq: Once | INTRAVENOUS | Status: AC
  Administered 2024-07-02: 20 mg via INTRAVENOUS
  Filled 2024-07-02: qty 50

## 2024-07-02 MED ORDER — SODIUM CHLORIDE 0.9 % IV SOLN
20.0000 mg | Freq: Once | INTRAVENOUS | Status: AC
  Administered 2024-07-02: 20 mg via INTRAVENOUS
  Filled 2024-07-02: qty 20

## 2024-07-02 MED ORDER — CETIRIZINE HCL 10 MG/ML IV SOLN
10.0000 mg | Freq: Once | INTRAVENOUS | Status: AC
  Administered 2024-07-02: 10 mg via INTRAVENOUS
  Filled 2024-07-02: qty 1

## 2024-07-02 MED ORDER — METHYLPREDNISOLONE SODIUM SUCC 125 MG IJ SOLR
125.0000 mg | Freq: Once | INTRAMUSCULAR | Status: AC | PRN
  Administered 2024-07-02: 40 mg via INTRAVENOUS

## 2024-07-02 MED ORDER — SODIUM CHLORIDE 0.9 % IV SOLN: INTRAVENOUS | Status: DC

## 2024-07-02 MED ORDER — FAMOTIDINE IN NACL 20-0.9 MG/50ML-% IV SOLN
20.0000 mg | Freq: Once | INTRAVENOUS | Status: AC | PRN
  Administered 2024-07-02: 20 mg via INTRAVENOUS

## 2024-07-02 MED ORDER — ACETAMINOPHEN 325 MG PO TABS
650.0000 mg | ORAL_TABLET | Freq: Once | ORAL | Status: AC
  Administered 2024-07-02: 650 mg via ORAL
  Filled 2024-07-02: qty 2

## 2024-07-02 MED ORDER — SODIUM CHLORIDE 0.9 % IV SOLN
1000.0000 mg | Freq: Once | INTRAVENOUS | Status: AC
  Administered 2024-07-02: 1000 mg via INTRAVENOUS
  Filled 2024-07-02: qty 40

## 2024-07-02 NOTE — Progress Notes (Signed)
    DATE:  07/02/24                                        X CHEMO/IMMUNOTHERAPY REACTION          MD: Sherrod   AGENT/BLOOD PRODUCT RECEIVING TODAY:              Gazyva    AGENT/BLOOD PRODUCT RECEIVING IMMEDIATELY PRIOR TO REACTION:          Gazyva     Vitals:   07/02/24 1206 07/02/24 1209  BP: (!) 149/93 (!) 129/90  Pulse: 100 94  Resp: (!) 24 20  SpO2: 96% 96%      REACTION(S):           shortness of breath   PREMEDS:     Pepcid  20 mg IV, Decadron  20 mg IV, Cetrizine 10 mg IV   INTERVENTION: Pepcid  20 mg IV, solu-medrol  40 mg IV   Review of Systems  Review of Systems  Respiratory:  Positive for shortness of breath.   All other systems reviewed and are negative.    Physical Exam  Physical Exam Vitals and nursing note reviewed.  Constitutional:      Appearance: He is not ill-appearing or toxic-appearing.  HENT:     Head: Normocephalic.  Eyes:     Conjunctiva/sclera: Conjunctivae normal.  Cardiovascular:     Rate and Rhythm: Normal rate and regular rhythm.     Pulses: Normal pulses.     Heart sounds: Normal heart sounds.  Pulmonary:     Breath sounds: Normal breath sounds. No stridor. No wheezing, rhonchi or rales.     Comments: tachypneic Chest:     Chest wall: No tenderness.  Abdominal:     General: There is no distension.  Musculoskeletal:     Cervical back: Normal range of motion.  Skin:    General: Skin is warm and dry.  Neurological:     Mental Status: He is alert.     OUTCOME:                  Patient became symptomatic approximately 1 hour into infusion. He reported shortness of breath had been slowly worsening since he started treatment. He denied chest pain, back pain, or headache. Clear lung exam. Emergency medications were administered as documented above. Patient returned to baseline. Patient restarted and tolerated remainder of treatment. Will notify oncologist.    I have spent a total of 10 minutes minutes of face-to-face and  non-face-to-face time preparing to see the patient, performing a medically appropriate examination, counseling and educating the patient, ordering tests/procedures/medications, documenting clinical information in the electronic health record, and care coordination.

## 2024-07-02 NOTE — Patient Instructions (Signed)
 [facility] - A DEPT OF [facility]  Discharge Instructions: Thank you for choosing [facility] to provide your oncology and hematology care.   If you have a lab appointment with the Cancer Center, please go directly to the Cancer Center and check in at the registration area.   Wear comfortable clothing and clothing appropriate for easy access to any Portacath or PICC line.   We strive to give you quality time with your provider. You may need to reschedule your appointment if you arrive late (15 or more minutes).  Arriving late affects you and other patients whose appointments are after yours.  Also, if you miss three or more appointments without notifying the office, you may be dismissed from the clinic at the provider's discretion.      For prescription refill requests, have your pharmacy contact our office and allow 72 hours for refills to be completed.    Today you received the following chemotherapy and/or immunotherapy agents Gazyva   To help prevent nausea and vomiting after your treatment, we encourage you to take your nausea medication as directed.  BELOW ARE SYMPTOMS THAT SHOULD BE REPORTED IMMEDIATELY: *FEVER GREATER THAN 100.4 F (38 C) OR HIGHER *CHILLS OR SWEATING *NAUSEA AND VOMITING THAT IS NOT CONTROLLED WITH YOUR NAUSEA MEDICATION *UNUSUAL SHORTNESS OF BREATH *UNUSUAL BRUISING OR BLEEDING *URINARY PROBLEMS (pain or burning when urinating, or frequent urination) *BOWEL PROBLEMS (unusual diarrhea, constipation, pain near the anus) TENDERNESS IN MOUTH AND THROAT WITH OR WITHOUT PRESENCE OF ULCERS (sore throat, sores in mouth, or a toothache) UNUSUAL RASH, SWELLING OR PAIN  UNUSUAL VAGINAL DISCHARGE OR ITCHING   Items with * indicate a potential emergency and should be followed up as soon as possible or go to the Emergency Department if any problems should occur.  Please show the CHEMOTHERAPY ALERT CARD or IMMUNOTHERAPY ALERT  CARD at check-in to the Emergency Department and triage nurse.  Should you have questions after your visit or need to cancel or reschedule your appointment, please contact [facility] - A DEPT OF [facility]  Dept: [phone]  and follow the prompts.  Office hours are 8:00 a.m. to 4:30 p.m. Monday - Friday. Please note that voicemails left after 4:00 p.m. may not be returned until the following business day.  We are closed weekends and major holidays. You have access to a nurse at all times for urgent questions. Please call the main number to the clinic Dept: [phone] and follow the prompts.   For any non-urgent questions, you may also contact your provider using MyChart. We now offer e-Visits for anyone 76 and older to request care online for non-urgent symptoms. For details visit [email or web address].   Also download the MyChart app! Go to the app store, search "MyChart", open the app, select [place], and log in with your MyChart username and password.

## 2024-07-02 NOTE — Progress Notes (Signed)
 Hypersensitivity Reaction Note  Date of event: 07/02/24  Time of event: 1206   Type of event: Grade 2 (Moderate reaction; Requires therapy or infusion interruption, but responds promptly to interventions; prophylactic medications indicated for <24 hours)    Generic name of drug involved: Obinutuzumab  (Gazyva )  Initial Presentation and Response:  The patient reported and showed signs of shortness of breath  during the infusion.   Provider notified of the hypersensitivity reaction: Mallie Combes, PA-C  Time of provider notification: 1206  Initial Interventions Implemented:   Infusion stopped: Yes  Medications administered - see MAR for sequence and times of administration: Normal Saline 1 L IV, Famotidine  (Pepcid ) 20 mg IV, and Methylprednisolone  (Solu-Medrol ) 40 mg IV  Patient response to treatment: Symptoms resolved following interventions.  Remaining Course of Treatment:    The patient was able to complete the infusion at the original rate.  Was agent that likely caused hypersensitivity reaction added to Allergies List within EMR? Yes   Additional Information Regarding the Chain of Events (including reaction signs/symptoms, treatment administered, and outcome):  Pt described shortness of breath at 1206, infusion was stopped, Mallie PA was called and 1L of NS started. Pt was given 20mg  pepcid  per Mallie. At 1215 40mg  of solu-medrol  was given. At 1239 pt reported feeling back to his baseline. Pt's colostomy bag needed to be changed and then gazyva  was restarted at 1340.

## 2024-07-05 ENCOUNTER — Other Ambulatory Visit: Payer: Self-pay

## 2024-07-05 ENCOUNTER — Emergency Department (HOSPITAL_COMMUNITY)

## 2024-07-05 ENCOUNTER — Inpatient Hospital Stay (HOSPITAL_COMMUNITY)
Admission: EM | Admit: 2024-07-05 | Discharge: 2024-07-09 | DRG: 813 | Disposition: A | Discharge status: Home Health | Attending: Internal Medicine | Admitting: Internal Medicine

## 2024-07-05 DIAGNOSIS — D61818 Other pancytopenia: Secondary | ICD-10-CM | POA: Diagnosis present

## 2024-07-05 DIAGNOSIS — E8809 Other disorders of plasma-protein metabolism, not elsewhere classified: Secondary | ICD-10-CM | POA: Diagnosis present

## 2024-07-05 DIAGNOSIS — D696 Thrombocytopenia, unspecified: Secondary | ICD-10-CM | POA: Diagnosis not present

## 2024-07-05 DIAGNOSIS — T451X5A Adverse effect of antineoplastic and immunosuppressive drugs, initial encounter: Secondary | ICD-10-CM | POA: Diagnosis not present

## 2024-07-05 DIAGNOSIS — C9112 Chronic lymphocytic leukemia of B-cell type in relapse: Secondary | ICD-10-CM | POA: Diagnosis present

## 2024-07-05 DIAGNOSIS — Z8249 Family history of ischemic heart disease and other diseases of the circulatory system: Secondary | ICD-10-CM

## 2024-07-05 DIAGNOSIS — D6959 Other secondary thrombocytopenia: Secondary | ICD-10-CM | POA: Diagnosis not present

## 2024-07-05 DIAGNOSIS — C9110 Chronic lymphocytic leukemia of B-cell type not having achieved remission: Secondary | ICD-10-CM | POA: Diagnosis present

## 2024-07-05 DIAGNOSIS — Z791 Long term (current) use of non-steroidal anti-inflammatories (NSAID): Secondary | ICD-10-CM

## 2024-07-05 DIAGNOSIS — Z933 Colostomy status: Secondary | ICD-10-CM

## 2024-07-05 DIAGNOSIS — D649 Anemia, unspecified: Principal | ICD-10-CM

## 2024-07-05 DIAGNOSIS — R162 Hepatomegaly with splenomegaly, not elsewhere classified: Secondary | ICD-10-CM | POA: Diagnosis present

## 2024-07-05 DIAGNOSIS — D630 Anemia in neoplastic disease: Secondary | ICD-10-CM | POA: Diagnosis present

## 2024-07-05 DIAGNOSIS — Z803 Family history of malignant neoplasm of breast: Secondary | ICD-10-CM

## 2024-07-05 DIAGNOSIS — Z79899 Other long term (current) drug therapy: Secondary | ICD-10-CM

## 2024-07-05 DIAGNOSIS — D849 Immunodeficiency, unspecified: Secondary | ICD-10-CM | POA: Diagnosis present

## 2024-07-05 DIAGNOSIS — Z823 Family history of stroke: Secondary | ICD-10-CM

## 2024-07-05 DIAGNOSIS — R0682 Tachypnea, not elsewhere classified: Secondary | ICD-10-CM | POA: Diagnosis present

## 2024-07-05 DIAGNOSIS — I10 Essential (primary) hypertension: Secondary | ICD-10-CM | POA: Diagnosis present

## 2024-07-05 DIAGNOSIS — E872 Acidosis, unspecified: Secondary | ICD-10-CM | POA: Diagnosis present

## 2024-07-05 DIAGNOSIS — Z96642 Presence of left artificial hip joint: Secondary | ICD-10-CM | POA: Diagnosis present

## 2024-07-05 DIAGNOSIS — R6510 Systemic inflammatory response syndrome (SIRS) of non-infectious origin without acute organ dysfunction: Secondary | ICD-10-CM | POA: Diagnosis present

## 2024-07-05 DIAGNOSIS — R17 Unspecified jaundice: Secondary | ICD-10-CM | POA: Diagnosis present

## 2024-07-05 DIAGNOSIS — Z86718 Personal history of other venous thrombosis and embolism: Secondary | ICD-10-CM

## 2024-07-05 DIAGNOSIS — F17210 Nicotine dependence, cigarettes, uncomplicated: Secondary | ICD-10-CM | POA: Diagnosis present

## 2024-07-05 DIAGNOSIS — D6481 Anemia due to antineoplastic chemotherapy: Secondary | ICD-10-CM | POA: Diagnosis present

## 2024-07-05 DIAGNOSIS — Z1152 Encounter for screening for COVID-19: Secondary | ICD-10-CM

## 2024-07-05 DIAGNOSIS — Z8673 Personal history of transient ischemic attack (TIA), and cerebral infarction without residual deficits: Secondary | ICD-10-CM

## 2024-07-05 DIAGNOSIS — Z85048 Personal history of other malignant neoplasm of rectum, rectosigmoid junction, and anus: Secondary | ICD-10-CM

## 2024-07-05 DIAGNOSIS — N200 Calculus of kidney: Secondary | ICD-10-CM | POA: Diagnosis present

## 2024-07-05 DIAGNOSIS — M7989 Other specified soft tissue disorders: Secondary | ICD-10-CM | POA: Diagnosis present

## 2024-07-05 LAB — URINALYSIS, W/ REFLEX TO CULTURE (INFECTION SUSPECTED)
Bacteria, UA: NONE SEEN
Bilirubin Urine: NEGATIVE
Glucose, UA: NEGATIVE mg/dL
Ketones, ur: NEGATIVE mg/dL
Leukocytes,Ua: NEGATIVE
Nitrite: NEGATIVE
Protein, ur: 30 mg/dL — AB
Specific Gravity, Urine: 1.018 (ref 1.005–1.030)
pH: 5 (ref 5.0–8.0)

## 2024-07-05 LAB — COMPREHENSIVE METABOLIC PANEL WITH GFR
ALT: 24 U/L (ref 0–44)
AST: 67 U/L — ABNORMAL HIGH (ref 15–41)
Albumin: 3.4 g/dL — ABNORMAL LOW (ref 3.5–5.0)
Alkaline Phosphatase: 62 U/L (ref 38–126)
Anion gap: 12 (ref 5–15)
BUN: 18 mg/dL (ref 8–23)
CO2: 19 mmol/L — ABNORMAL LOW (ref 22–32)
Calcium: 8.5 mg/dL — ABNORMAL LOW (ref 8.9–10.3)
Chloride: 107 mmol/L (ref 98–111)
Creatinine, Ser: 0.87 mg/dL (ref 0.61–1.24)
GFR, Estimated: >60 mL/min (ref >60)
Glucose, Bld: 124 mg/dL — ABNORMAL HIGH (ref 70–99)
Potassium: 3.9 mmol/L (ref 3.5–5.1)
Sodium: 138 mmol/L (ref 135–145)
Total Bilirubin: 2.3 mg/dL — ABNORMAL HIGH (ref 0.0–1.2)
Total Protein: 5.2 g/dL — ABNORMAL LOW (ref 6.5–8.1)

## 2024-07-05 LAB — CBC WITH DIFFERENTIAL/PLATELET
Abs Immature Granulocytes: 0.03 K/uL (ref 0.00–0.07)
Basophils Absolute: 0 K/uL (ref 0.0–0.1)
Basophils Relative: 1 %
Eosinophils Absolute: 0.3 K/uL (ref 0.0–0.5)
Eosinophils Relative: 6 %
HCT: 21.8 % — ABNORMAL LOW (ref 39.0–52.0)
Hemoglobin: 6.9 g/dL — CL (ref 13.0–17.0)
Immature Granulocytes: 1 %
Lymphocytes Relative: 21 %
Lymphs Abs: 1 K/uL (ref 0.7–4.0)
MCH: 31.2 pg (ref 26.0–34.0)
MCHC: 31.7 g/dL (ref 30.0–36.0)
MCV: 98.6 fL (ref 80.0–100.0)
Monocytes Absolute: 0.4 K/uL (ref 0.1–1.0)
Monocytes Relative: 8 %
Neutro Abs: 3 K/uL (ref 1.7–7.7)
Neutrophils Relative %: 63 %
Platelets: 16 K/uL — CL (ref 150–400)
RBC: 2.21 MIL/uL — ABNORMAL LOW (ref 4.22–5.81)
RDW: 16.2 % — ABNORMAL HIGH (ref 11.5–15.5)
WBC: 4.7 K/uL (ref 4.0–10.5)
nRBC: 1.3 % — ABNORMAL HIGH (ref 0.0–0.2)

## 2024-07-05 LAB — RESP PANEL BY RT-PCR (RSV, FLU A&B, COVID)  RVPGX2
Influenza A by PCR: NEGATIVE
Influenza B by PCR: NEGATIVE
Resp Syncytial Virus by PCR: NEGATIVE
SARS Coronavirus 2 by RT PCR: NEGATIVE

## 2024-07-05 LAB — I-STAT CG4 LACTIC ACID, ED
Lactic Acid, Venous: 1.4 mmol/L (ref 0.5–1.9)
Lactic Acid, Venous: 1.2 mmol/L (ref 0.5–1.9)

## 2024-07-05 LAB — PROTIME-INR
INR: 1.3 — ABNORMAL HIGH (ref 0.8–1.2)
Prothrombin Time: 16.5 s — ABNORMAL HIGH (ref 11.4–15.2)

## 2024-07-05 LAB — PREPARE RBC (CROSSMATCH)

## 2024-07-05 MED ORDER — ACETAMINOPHEN 325 MG PO TABS
650.0000 mg | ORAL_TABLET | Freq: Once | ORAL | Status: AC
  Administered 2024-07-05: 650 mg via ORAL
  Filled 2024-07-05: qty 2

## 2024-07-05 MED ORDER — IOHEXOL 300 MG/ML  SOLN
100.0000 mL | Freq: Once | INTRAMUSCULAR | Status: AC | PRN
Start: 2024-07-05 — End: 2024-07-05
  Administered 2024-07-05: 100 mL via INTRAVENOUS

## 2024-07-05 MED ORDER — METRONIDAZOLE 500 MG/100ML IV SOLN
500.0000 mg | Freq: Once | INTRAVENOUS | Status: AC
  Administered 2024-07-05: 500 mg via INTRAVENOUS
  Filled 2024-07-05: qty 100

## 2024-07-05 MED ORDER — VANCOMYCIN HCL IN DEXTROSE 1-5 GM/200ML-% IV SOLN: 1000.0000 mg | Freq: Once | INTRAVENOUS | Status: DC

## 2024-07-05 MED ORDER — SODIUM CHLORIDE 0.9 % IV SOLN
2.0000 g | Freq: Once | INTRAVENOUS | Status: AC
  Administered 2024-07-05: 2 g via INTRAVENOUS
  Filled 2024-07-05: qty 12.5

## 2024-07-05 MED ORDER — LACTATED RINGERS IV BOLUS (SEPSIS)
1000.0000 mL | Freq: Once | INTRAVENOUS | Status: AC
  Administered 2024-07-05: 1000 mL via INTRAVENOUS

## 2024-07-05 MED ORDER — SODIUM CHLORIDE 0.9% IV SOLUTION
Freq: Once | INTRAVENOUS | Status: AC
Start: 2024-07-05 — End: 2024-07-06

## 2024-07-05 MED ORDER — SODIUM CHLORIDE 0.9% IV SOLUTION: Freq: Once | INTRAVENOUS | Status: AC

## 2024-07-05 MED ORDER — VANCOMYCIN HCL 1500 MG/300ML IV SOLN
1500.0000 mg | Freq: Once | INTRAVENOUS | Status: AC
Start: 2024-07-05 — End: 2024-07-05
  Administered 2024-07-05: 1500 mg via INTRAVENOUS
  Filled 2024-07-05: qty 300

## 2024-07-05 NOTE — Sepsis Progress Note (Signed)
 Elink monitoring for the code sepsis protocol.

## 2024-07-05 NOTE — ED Triage Notes (Addendum)
 BIB EMS from [place] [facility].  Pt pressed his alert button.  EMS found him febrile, smell of ammonia, last chemo 2-3 days ago.  Alert and oriented x 3 which is new per son via phone call.  VSS RR mid 30s, Capno 27, HR 90s, BP 150s systolic.  Attempted IV by EMS without success several times.

## 2024-07-05 NOTE — ED Provider Notes (Signed)
 [place] EMERGENCY DEPARTMENT AT [facility] Provider Note   CSN: [id number] Arrival date & time: 07/05/24  1851     Patient presents with: Code Sepsis   Aaron Gonzalez is a 78 y.o. male.   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  Pt lives in independent living.  He rang the alert button, but does not remember why.  EMS was called and brought him here.  Febrile per EMS.  Pt's son told EMS he is a little more confused than normal.  He does not recall his recent chemo. Pt denies any pain.  CLL is followed by Dr. Sherrod.       Prior to Admission medications   Medication Sig Start Date End Date Taking? Authorizing Provider  acyclovir  (ZOVIRAX ) 400 MG tablet Take 1 tablet (400 mg total) by mouth daily. 06/16/24   Sherrod Sherrod, MD  allopurinol  (ZYLOPRIM ) 300 MG tablet Take 1 tablet (300 mg total) by mouth daily. 06/16/24   Sherrod Sherrod, MD  amLODipine  (NORVASC ) 10 MG tablet Take 1 tablet by mouth daily.    [provider]  blood glucose meter kit and supplies KIT Dispense based on patient and insurance preference. Use up to four times daily as directed. 04/06/21   Jillian Buttery, MD  carvedilol  (COREG ) 6.25 MG tablet TAKE 1 TABLET(6.25 MG) BY MOUTH TWICE DAILY 07/04/23   [place], Peter M, MD  cyclobenzaprine  (FLEXERIL ) 10 MG tablet Take 1 tablet (10 mg total) by mouth 3 (three) times daily as needed for muscle spasms. 02/06/23   Standiford, Marsa FALCON, DPM  meloxicam  (MOBIC ) 15 MG tablet TAKE 1 TABLET(15 MG) BY MOUTH DAILY 05/05/23   McCaughan, Dia D, DPM  ondansetron  (ZOFRAN ) 8 MG tablet Take 1 tablet (8 mg total) by mouth every 8 (eight) hours as needed for nausea or vomiting. 06/16/24   Sherrod Sherrod, MD  prochlorperazine  (COMPAZINE ) 10 MG tablet Take 1 tablet (10 mg total) by mouth every 6 (six) hours as needed for nausea or vomiting. 06/16/24   Sherrod Sherrod,  MD  sulfamethoxazole -trimethoprim  (BACTRIM  DS) 800-160 MG tablet Take 1 tablet by mouth 3 (three) times a week. 06/16/24   Sherrod Sherrod, MD  venetoclax  (VENCLEXTA ) 10 MG tablet Take 1 tablet (10 mg total) by mouth daily for first week of treatment, then increase to 2 tablets (20 mg total) by mouth daily for second week of treatment.Take with food and water . Start: 07/15/24 06/15/24   Sherrod Sherrod, MD  venetoclax  (VENCLEXTA ) 100 MG tablet Take 1 tablet (100 mg total) by mouth daily for fourth week of treatment. Take with food and water . Start 08/05/24 06/15/24   Sherrod Sherrod, MD  venetoclax  (VENCLEXTA ) 50 MG tablet Take 1 tablet (50 mg total) by mouth daily for third week of treatment. Take with food and water . Start 07/29/24 06/15/24   Sherrod Sherrod, MD    Allergies: Gazyva  [obinutuzumab ]    Review of Systems  Constitutional:  Positive for fatigue and fever.  All other systems reviewed and are negative.   Updated Vital Signs BP 124/86   Pulse 91   Temp 99.1 F (37.3 C) (Oral)   Resp 18   SpO2 94%   Physical Exam Vitals and nursing note reviewed.  Constitutional:      Appearance: Normal appearance.  HENT:     Head: Normocephalic and atraumatic.     Right Ear: External  ear normal.     Left Ear: External ear normal.     Nose: Nose normal.     Mouth/Throat:     Mouth: Mucous membranes are dry.  Eyes:     Extraocular Movements: Extraocular movements intact.     Conjunctiva/sclera: Conjunctivae normal.     Pupils: Pupils are equal, round, and reactive to light.  Cardiovascular:     Rate and Rhythm: Regular rhythm. Tachycardia present.     Pulses: Normal pulses.     Heart sounds: Normal heart sounds.  Pulmonary:     Effort: Pulmonary effort is normal.     Breath sounds: Normal breath sounds.  Abdominal:     General: Abdomen is flat. Bowel sounds are normal.     Palpations: Abdomen is soft.     Comments: Colostomy noted  Musculoskeletal:        General: Normal range  of motion.     Cervical back: Normal range of motion and neck supple.  Skin:    General: Skin is warm.     Capillary Refill: Capillary refill takes less than 2 seconds.  Neurological:     General: No focal deficit present.     Mental Status: He is alert and oriented to person, place, and time.  Psychiatric:        Mood and Affect: Mood normal.        Behavior: Behavior normal.     (all labs ordered are listed, but only abnormal results are displayed) Labs Reviewed  COMPREHENSIVE METABOLIC PANEL WITH GFR - Abnormal; Notable for the following components:      Result Value   CO2 19 (*)    Glucose, Bld 124 (*)    Calcium  8.5 (*)    Total Protein 5.2 (*)    Albumin 3.4 (*)    AST 67 (*)    Total Bilirubin 2.3 (*)    All other components within normal limits  CBC WITH DIFFERENTIAL/PLATELET - Abnormal; Notable for the following components:   RBC 2.21 (*)    Hemoglobin 6.9 (*)    HCT 21.8 (*)    RDW 16.2 (*)    Platelets 16 (*)    nRBC 1.3 (*)    All other components within normal limits  PROTIME-INR - Abnormal; Notable for the following components:   Prothrombin Time 16.5 (*)    INR 1.3 (*)    All other components within normal limits  URINALYSIS, W/ REFLEX TO CULTURE (INFECTION SUSPECTED) - Abnormal; Notable for the following components:   Hgb urine dipstick MODERATE (*)    Protein, ur 30 (*)    All other components within normal limits  RESP PANEL BY RT-PCR (RSV, FLU A&B, COVID)  RVPGX2  CULTURE, BLOOD (ROUTINE X 2)  CULTURE, BLOOD (ROUTINE X 2)  I-STAT CG4 LACTIC ACID, ED  I-STAT CG4 LACTIC ACID, ED  TYPE AND SCREEN  PREPARE RBC (CROSSMATCH)  PREPARE PLATELET PHERESIS    EKG: None  Radiology: DG Chest Port 1 View Result Date: 07/05/2024 CLINICAL DATA:  Questionable sepsis - evaluate for abnormality EXAM: PORTABLE CHEST 1 VIEW COMPARISON:  Chest x-ray 04/01/2021 FINDINGS: The heart and mediastinal contours are unchanged. Atherosclerotic plaque. No focal  consolidation. No pulmonary edema. No pleural effusion. No pneumothorax. No acute osseous abnormality. IMPRESSION: No active disease. Electronically Signed   By: Morgane  Naveau M.D.   On: 07/05/2024 19:38     Procedures   Medications Ordered in the ED  0.9 %  sodium chloride  infusion (Manually  program via Guardrails IV Fluids) (has no administration in time range)  0.9 %  sodium chloride  infusion (Manually program via Guardrails IV Fluids) (has no administration in time range)  iohexol  (OMNIPAQUE ) 300 MG/ML solution 100 mL (has no administration in time range)  lactated ringers  bolus 1,000 mL (0 mLs Intravenous Stopped 07/05/24 2209)  ceFEPIme  (MAXIPIME ) 2 g in sodium chloride  0.9 % 100 mL IVPB (0 g Intravenous Stopped 07/05/24 2007)  metroNIDAZOLE  (FLAGYL ) IVPB 500 mg (0 mg Intravenous Stopped 07/05/24 2053)  vancomycin  (VANCOREADY) IVPB 1500 mg/300 mL (0 mg Intravenous Stopped 07/05/24 2239)  acetaminophen  (TYLENOL ) tablet 650 mg (650 mg Oral Given 07/05/24 2003)                                    Medical Decision Making Amount and/or Complexity of Data Reviewed Labs: ordered. Radiology: ordered.  Risk OTC drugs. Prescription drug management. Decision regarding hospitalization.   This patient presents to the ED for concern of sepsis, this involves an extensive number of treatment options, and is a complaint that carries with it a high risk of complications and morbidity.  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  The pertinent results include:  cmp nl other than mild  elevation of AST, inr 1.3; lactic nl; ua nl; cbc with wbc 4.7 (132.3 on 9/11); hgb 6.9 (10.9 on 9/11) and plt low at 16 (92 on 9/11)   Imaging Studies ordered:  I ordered imaging studies including cxr  I independently visualized and interpreted imaging which showed No active disease.  I agree with the radiologist interpretation   Cardiac Monitoring:  The patient was maintained on a cardiac monitor.  I personally viewed and interpreted the cardiac monitored which showed an underlying rhythm of: st   Medicines ordered and prescription drug management:  I ordered medication including ivfs/abx  for sepsis  Reevaluation of the patient after these medicines showed that the patient improved I have reviewed the patients home medicines and have made adjustments as needed   Test Considered:  ct   Critical Interventions:  abx   Consultations Obtained:  I requested consultation with the hematologist (Dr. Autumn),  and discussed lab and imaging findings as well as pertinent plan - he recommends 1 unit prbc and 1 unit plt (keep plt over 20,000 due to possible sepsis) Pt d/w Dr. Tobie ([facility]) for admission.   Problem List / ED Course:  Symptomatic anemia and thrombocytopenia:  transfusions ordered Possible sepsis:  pt did meet sepsis criteria, so a code sepsis was called and pt given iv abx.  No obvious source, so CT has been ordered.  CT pending upon admission.   Reevaluation:  After the interventions noted above, I reevaluated the patient and found that they have :improved   Social Determinants of Health:  Lives in independent living   Dispostion:  After consideration of the diagnostic results and the patients response to treatment, I feel that the patent would benefit from admission.    CRITICAL CARE Performed by: Mliss Boyers   Total critical care  time: 30 minutes  Critical care time was exclusive of separately billable procedures and treating other  patients.  Critical care was necessary to treat or prevent imminent or life-threatening deterioration.  Critical care was time spent personally by me on the following activities: development of treatment plan with patient and/or surrogate as well as nursing, discussions with consultants, evaluation of patient's response to treatment, examination of patient, obtaining history from patient or surrogate, ordering and performing treatments and interventions, ordering and review of laboratory studies, ordering and review of radiographic studies, pulse oximetry and re-evaluation of patient's condition.      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

## 2024-07-05 NOTE — H&P (Signed)
 History and Physical    Aaron Gonzalez FMW:[id number] DOB: December 11, 1945 DOA: 07/05/2024  PCP: [name], MD  Patient coming from: ILF  I have personally briefly reviewed patient's old medical records in [facility]  Chief Complaint: ***  HPI: Aaron Gonzalez is a 78 y.o. 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  Patient was recently started on treatment for progression of CLL with Gazyva  and venetoclax .  Last infusion of Gazyva  was on 9/12.  He is expected to start venetoclax  9/25.  ***  ED Course  Labs/Imaging on admission: I have personally reviewed following labs and imaging studies.  Initial vitals showed BP 146/96, pulse 114, RR 17, temp 99.1 F, SpO2 94% on room air.  Labs showed WBC 4.7, hemoglobin 6.9, platelets 16, sodium 138, potassium 3.9, bicarb 19, BUN 18, creatinine 0.87, serum glucose 124, AST 67, ALT 24, alk phos 62, total bilirubin 2.3, lactic acid 1.4 > 1.2, INR 1.3.  SARS-CoV-2, influenza, RSV PCR negative.  Blood cultures in process.  UA negative for UTI.  CT chest/abdomen/pelvis with contrast is negative for acute intrathoracic abnormality.  Limited evaluation of the pelvis due to streak artifact originating from bilateral femoral surgical hardware.  Hepatosplenomegaly.  Nonobstructive punctate right nephrolithiasis.  Incidentally noted aberrant right subclavian artery.  Patient was given 1 L LR, IV vancomycin , cefepime , and Flagyl .  EDP discussed with on-call hematology (Dr. Autumn) who recommended transfusing 1 unit PRBC and 1 unit platelets.  The hospitalist service was consulted for admission.  Review of Systems: All systems reviewed and are negative except as documented  in history of present illness above.   Past Medical History:  Diagnosis Date   Arthritis    CLL (chronic lymphocytic leukemia) (HCC)    DVT (deep venous thrombosis) (HCC)    History of rectal cancer    Hypertension    Stroke [facility])     Past Surgical History:  Procedure Laterality Date   COLECTOMY     with colostomy   TOTAL HIP ARTHROPLASTY Left 10/26/2021   Procedure: Left TOTAL HIP ARTHROPLASTY ANTERIOR APPROACH;  Surgeon: Vernetta Lonni GRADE, MD;  Location: [facility] ORS;  Service: Orthopedics;  Laterality: Left;  RNFA    Social History: Social History   Tobacco Use   Smoking status: Former    Current packs/day: 0.50    Average packs/day: 0.5 packs/day for 10.0 years (5.0 ttl pk-yrs)    Types: Cigarettes    Passive exposure: Never   Smokeless tobacco: Never  Vaping Use   Vaping status: Never Used  Substance Use Topics   Alcohol use: Never   Drug use: Never   Allergies  Allergen Reactions   Gazyva  [Obinutuzumab ] Shortness Of Breath, Nausea And Vomiting and Other (See Comments)    Pt complained of nausea, had emesis, diaphoretic, facial flushing, headache and back pain. See progress notes 06/24/24 for information. Patient with new reaction on 07/02/2024 with shortness of breath. See progress note from 07/02/2024.    Family History  Problem Relation Age of Onset   Hypertension Mother    Heart failure Mother    Hypertension Father    Stroke Father    Breast cancer Sister    Stroke Brother      Prior to Admission medications  Medication Sig Start Date End Date Taking? Authorizing Provider  acyclovir  (ZOVIRAX ) 400 MG tablet Take 1 tablet (400 mg total) by mouth daily. 06/16/24   Sherrod Sherrod, MD  allopurinol  (ZYLOPRIM ) 300 MG tablet Take 1 tablet (300 mg total) by mouth daily. 06/16/24   Sherrod Sherrod, MD  amLODipine  (NORVASC ) 10 MG tablet Take 1 tablet by mouth daily.    [provider]  blood glucose meter kit and supplies KIT Dispense based on patient  and insurance preference. Use up to four times daily as directed. 04/06/21   Jillian Buttery, MD  carvedilol  (COREG ) 6.25 MG tablet TAKE 1 TABLET(6.25 MG) BY MOUTH TWICE DAILY 07/04/23   [place], Peter M, MD  cyclobenzaprine  (FLEXERIL ) 10 MG tablet Take 1 tablet (10 mg total) by mouth 3 (three) times daily as needed for muscle spasms. 02/06/23   Standiford, Marsa FALCON, DPM  meloxicam  (MOBIC ) 15 MG tablet TAKE 1 TABLET(15 MG) BY MOUTH DAILY 05/05/23   McCaughan, Dia D, DPM  ondansetron  (ZOFRAN ) 8 MG tablet Take 1 tablet (8 mg total) by mouth every 8 (eight) hours as needed for nausea or vomiting. 06/16/24   Sherrod Sherrod, MD  prochlorperazine  (COMPAZINE ) 10 MG tablet Take 1 tablet (10 mg total) by mouth every 6 (six) hours as needed for nausea or vomiting. 06/16/24   Sherrod Sherrod, MD  sulfamethoxazole -trimethoprim  (BACTRIM  DS) 800-160 MG tablet Take 1 tablet by mouth 3 (three) times a week. 06/16/24   Sherrod Sherrod, MD  venetoclax  (VENCLEXTA ) 10 MG tablet Take 1 tablet (10 mg total) by mouth daily for first week of treatment, then increase to 2 tablets (20 mg total) by mouth daily for second week of treatment.Take with food and water . Start: 07/15/24 06/15/24   Sherrod Sherrod, MD  venetoclax  (VENCLEXTA ) 100 MG tablet Take 1 tablet (100 mg total) by mouth daily for fourth week of treatment. Take with food and water . Start 08/05/24 06/15/24   Sherrod Sherrod, MD  venetoclax  (VENCLEXTA ) 50 MG tablet Take 1 tablet (50 mg total) by mouth daily for third week of treatment. Take with food and water . Start 07/29/24 06/15/24   Sherrod Sherrod, MD    Physical Exam: Vitals:   07/05/24 2033 07/05/24 2130 07/05/24 2236 07/05/24 2255  BP:  (!) 143/88 (!) 125/90 124/86  Pulse:  89 92 91  Resp:  18 18 18   Temp: 99.1 F (37.3 C)  99.2 F (37.3 C) 99.1 F (37.3 C)  TempSrc: Oral  Oral Oral  SpO2:  95% 94% 94%   *** Constitutional: NAD, calm, comfortable Eyes: PERRL, lids and conjunctivae normal ENMT:  Mucous membranes are moist. Posterior pharynx clear of any exudate or lesions.Normal dentition.  Neck: normal, supple, no masses. Respiratory: clear to auscultation bilaterally, no wheezing, no crackles. Normal respiratory effort. No accessory muscle use.  Cardiovascular: Regular rate and rhythm, no murmurs / rubs / gallops. No extremity edema. 2+ pedal pulses. Abdomen: no tenderness, no masses palpated. Musculoskeletal: no clubbing / cyanosis. No joint deformity upper and lower extremities. Good ROM, no contractures. Normal muscle tone.  Skin: no rashes, lesions, ulcers. No induration Neurologic: Sensation intact. Strength 5/5 in all 4.  Psychiatric: Normal judgment and insight. Alert and oriented x 3. Normal mood.   EKG: Personally reviewed. Sinus tachycardia, rate 112, PAC.  Rate is faster when compared to previous.  Assessment/Plan Active Problems:   * No active hospital problems. *   *** No notes on file *** Assessment and Plan: Chemotherapy associated anemia/thrombocytopenia: ***  Transient confusion: ***  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  Patient recently found to have recurrence and started on treatment with Gazyva , last infusion 9/12 with plan to start venetoclax  on 9/25.  WBC is improved to 4.7 from 132.3 on 9/11.***  History of CVA: Patient with prior history of hemorrhagic stroke with***  Hypertension: ***  History of rectal cancer s/p resection with chronic colostomy: ***  Cognitive impairment: ***   DVT prophylaxis: ***  Code Status: ***  Family Communication: ***  Disposition Plan: From ILF, dispo pending clinical progress Consults called: EDP discussed with hematology Severity of Illness: The appropriate patient status for this patient is OBSERVATION. Observation status is judged to be reasonable and necessary in order to provide the required intensity of service to ensure the patient's safety. The patient's presenting  symptoms, physical exam findings, and initial radiographic and laboratory data in the context of their medical condition is felt to place them at decreased risk for further clinical deterioration. Furthermore, it is anticipated that the patient will be medically stable for discharge from the hospital within 2 midnights of admission.   Jorie Blanch MD [facility]  If 7PM-7AM, please contact night-coverage [email or web address]  07/05/2024, 11:43 PM

## 2024-07-05 NOTE — ED Notes (Signed)
 Patient transported to CT

## 2024-07-05 NOTE — Progress Notes (Signed)
 Due to previous infusion rxns to Gazyva  on C1D1 and C1D8, Pepcid  IV premed has been increased to 40mg  total (two Pepcid  IV 20mg  orders have been added to tx plan), and dex IV 20mg  has been substituted to Solu-Medrol  125mg  per Dr. Sherrod.  Deedra Pro, PharmD, MBA

## 2024-07-06 ENCOUNTER — Encounter (HOSPITAL_COMMUNITY): Payer: Self-pay | Admitting: Internal Medicine

## 2024-07-06 ENCOUNTER — Observation Stay (HOSPITAL_COMMUNITY)

## 2024-07-06 ENCOUNTER — Other Ambulatory Visit: Payer: Self-pay

## 2024-07-06 ENCOUNTER — Other Ambulatory Visit: Payer: Self-pay | Admitting: Internal Medicine

## 2024-07-06 DIAGNOSIS — D649 Anemia, unspecified: Secondary | ICD-10-CM

## 2024-07-06 DIAGNOSIS — I10 Essential (primary) hypertension: Secondary | ICD-10-CM

## 2024-07-06 DIAGNOSIS — R4182 Altered mental status, unspecified: Secondary | ICD-10-CM

## 2024-07-06 DIAGNOSIS — D6481 Anemia due to antineoplastic chemotherapy: Secondary | ICD-10-CM | POA: Diagnosis present

## 2024-07-06 DIAGNOSIS — C9110 Chronic lymphocytic leukemia of B-cell type not having achieved remission: Secondary | ICD-10-CM

## 2024-07-06 DIAGNOSIS — T451X5A Adverse effect of antineoplastic and immunosuppressive drugs, initial encounter: Secondary | ICD-10-CM | POA: Diagnosis not present

## 2024-07-06 DIAGNOSIS — R6510 Systemic inflammatory response syndrome (SIRS) of non-infectious origin without acute organ dysfunction: Secondary | ICD-10-CM

## 2024-07-06 DIAGNOSIS — R509 Fever, unspecified: Secondary | ICD-10-CM

## 2024-07-06 DIAGNOSIS — A419 Sepsis, unspecified organism: Secondary | ICD-10-CM | POA: Diagnosis not present

## 2024-07-06 DIAGNOSIS — D6959 Other secondary thrombocytopenia: Secondary | ICD-10-CM | POA: Diagnosis not present

## 2024-07-06 LAB — CBC
HCT: 29.6 % — ABNORMAL LOW (ref 39.0–52.0)
Hemoglobin: 9.9 g/dL — ABNORMAL LOW (ref 13.0–17.0)
MCH: 31.9 pg (ref 26.0–34.0)
MCHC: 33.4 g/dL (ref 30.0–36.0)
MCV: 95.5 fL (ref 80.0–100.0)
Platelets: 20 K/uL — CL (ref 150–400)
RBC: 3.1 MIL/uL — ABNORMAL LOW (ref 4.22–5.81)
RDW: 16.3 % — ABNORMAL HIGH (ref 11.5–15.5)
WBC: 4.4 K/uL (ref 4.0–10.5)
nRBC: 1.8 % — ABNORMAL HIGH (ref 0.0–0.2)

## 2024-07-06 LAB — BPAM RBC
Blood Product Expiration Date: 202510122359
ISSUE DATE / TIME: 202509152230
Unit Type and Rh: 6200

## 2024-07-06 LAB — BASIC METABOLIC PANEL WITH GFR
Anion gap: 12 (ref 5–15)
BUN: 14 mg/dL (ref 8–23)
CO2: 19 mmol/L — ABNORMAL LOW (ref 22–32)
Calcium: 7.8 mg/dL — ABNORMAL LOW (ref 8.9–10.3)
Chloride: 110 mmol/L (ref 98–111)
Creatinine, Ser: 0.71 mg/dL (ref 0.61–1.24)
GFR, Estimated: >60 mL/min (ref >60)
Glucose, Bld: 98 mg/dL (ref 70–99)
Potassium: 3.8 mmol/L (ref 3.5–5.1)
Sodium: 141 mmol/L (ref 135–145)

## 2024-07-06 LAB — TYPE AND SCREEN
ABO/RH(D): AB POS
Antibody Screen: NEGATIVE
Unit division: 0

## 2024-07-06 MED ORDER — SENNOSIDES-DOCUSATE SODIUM 8.6-50 MG PO TABS: 1.0000 | ORAL_TABLET | Freq: Every evening | ORAL | Status: DC | PRN

## 2024-07-06 MED ORDER — ONDANSETRON HCL 4 MG PO TABS: 4.0000 mg | ORAL_TABLET | Freq: Four times a day (QID) | ORAL | Status: DC | PRN

## 2024-07-06 MED ORDER — SODIUM CHLORIDE 0.9 % IV SOLN
2.0000 g | Freq: Three times a day (TID) | INTRAVENOUS | Status: DC
  Filled 2024-07-06: qty 12.5

## 2024-07-06 MED ORDER — SODIUM CHLORIDE 0.9% FLUSH
3.0000 mL | Freq: Two times a day (BID) | INTRAVENOUS | Status: DC
  Administered 2024-07-06 – 2024-07-09 (×5): 3 mL via INTRAVENOUS

## 2024-07-06 MED ORDER — SODIUM CHLORIDE 0.9 % IV SOLN: INTRAVENOUS | Status: DC

## 2024-07-06 MED ORDER — VANCOMYCIN HCL 1250 MG/250ML IV SOLN
1250.0000 mg | INTRAVENOUS | Status: DC
  Administered 2024-07-06: 1250 mg via INTRAVENOUS
  Filled 2024-07-06: qty 250

## 2024-07-06 MED ORDER — ACETAMINOPHEN 325 MG PO TABS
650.0000 mg | ORAL_TABLET | Freq: Four times a day (QID) | ORAL | Status: DC | PRN
  Administered 2024-07-06: 650 mg via ORAL
  Filled 2024-07-06: qty 2

## 2024-07-06 MED ORDER — ACYCLOVIR 400 MG PO TABS
400.0000 mg | ORAL_TABLET | Freq: Every day | ORAL | Status: DC
  Administered 2024-07-06 – 2024-07-09 (×4): 400 mg via ORAL
  Filled 2024-07-06 (×4): qty 1

## 2024-07-06 MED ORDER — ALLOPURINOL 300 MG PO TABS
300.0000 mg | ORAL_TABLET | Freq: Every day | ORAL | Status: DC
  Administered 2024-07-06 – 2024-07-09 (×4): 300 mg via ORAL
  Filled 2024-07-06: qty 1
  Filled 2024-07-06: qty 3
  Filled 2024-07-06 (×2): qty 1

## 2024-07-06 MED ORDER — ACETAMINOPHEN 650 MG RE SUPP: 650.0000 mg | Freq: Four times a day (QID) | RECTAL | Status: DC | PRN

## 2024-07-06 MED ORDER — ONDANSETRON HCL 4 MG/2ML IJ SOLN
4.0000 mg | Freq: Four times a day (QID) | INTRAMUSCULAR | Status: DC | PRN
  Administered 2024-07-06: 4 mg via INTRAVENOUS
  Filled 2024-07-06: qty 2

## 2024-07-06 MED ORDER — SODIUM CHLORIDE 0.9 % IV BOLUS
1000.0000 mL | Freq: Once | INTRAVENOUS | Status: AC
  Administered 2024-07-06: 1000 mL via INTRAVENOUS

## 2024-07-06 MED ORDER — SODIUM CHLORIDE 0.9 % IV SOLN
2.0000 g | Freq: Three times a day (TID) | INTRAVENOUS | Status: DC
  Administered 2024-07-06 – 2024-07-09 (×9): 2 g via INTRAVENOUS
  Filled 2024-07-06 (×8): qty 12.5

## 2024-07-06 MED ORDER — FUROSEMIDE 10 MG/ML IJ SOLN
20.0000 mg | Freq: Once | INTRAMUSCULAR | Status: AC
  Administered 2024-07-06: 20 mg via INTRAVENOUS
  Filled 2024-07-06: qty 4

## 2024-07-06 NOTE — Progress Notes (Signed)
 Pharmacy Antibiotic Note  Aaron Gonzalez is a 78 y.o. male admitted on 07/05/2024 with sepsis. PMH significant for CLL s/p chemotherapy on 07/02/24. Pharmacy has been consulted for cefepime , vancomycin  dosing.  Today, 07/06/24 WBC WNL, ANC 3. Expected to drop s/p chemotherapy SCr rounded to 1 for dose calculations  Plan: Cefepime  2 g IV q8h Vancomycin  1250 mg IV q24h for estimated AUC of 494 Goal AUC 400-550. Check levels as needed Follow renal function, culture data    Temp (24hrs), Avg:99.6 F (37.6 C), Min:98.3 F (36.8 C), Max:102.2 F (39 C)  Recent Labs  Lab 07/01/24 1454 07/05/24 1940 07/05/24 1947 07/05/24 2133 07/06/24 0620  WBC 132.3* 4.7  --   --  4.4  CREATININE 1.06 0.87  --   --  0.71  LATICACIDVEN  --   --  1.4 1.2  --     Estimated Creatinine Clearance: 66.2 mL/min (by C-G formula based on SCr of 0.71 mg/dL).    Allergies  Allergen Reactions   Gazyva  [Obinutuzumab ] Shortness Of Breath, Nausea And Vomiting and Other (See Comments)    Pt complained of nausea, had emesis, diaphoretic, facial flushing, headache and back pain. See progress notes 06/24/24 for information. Patient with new reaction on 07/02/2024 with shortness of breath. See progress note from 07/02/2024.    Antimicrobials this admission: cefepime  9/15 >>  vancomycin  9/15 >>   Dose adjustments this admission:  Microbiology results: 9/15 BCx: ngtd  Aaron Gonzalez, PharmD 07/06/2024 3:21 PM

## 2024-07-06 NOTE — Hospital Course (Addendum)
 Aaron Gonzalez is a 78 y.o. 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  Hospitalization has been complicated by fever of unclear etiology. Has been placed on IV Vanc/Cefepime . Fevers have improved but continues to be thrombocytopenic.  Case was discussed with Dr. Tina prior to discharge and he recommended transfusing the patient another unit of platelets and transition to p.o. antibiotics.  He was transition to oral doxycycline  and Augmentin  and he is medically stable for discharge at this time given his improvement in his fevers.  He is not bleeding anywhere and will continue further care in the outpatient setting  Assessment and Plan:  SIRS: Patient has no leukocytosis but has been febrile, tachycardic and tachypneic.  WBC is now 4.4.  COVID, RSV, influenza A and B are negative.  CT of the chest abdomen pelvis showed no acute intrathoracic abnormality but there was Limited evaluation of the pelvis due to streak artifact originating from bilateral femoral surgical hardware.  UA was relatively unremarkable and blood cultures x 2 showing NGTD @ 3 Days.  Urine culture Showing No Growth.  Chest x-ray done and showed no active disease.  Given his immunocompromised state will continue empiric IV antibiotics with IV cefepime  and IV vancomycin  and this was changed to p.o. Doxy and Augmentin .  Continue monitor for further signs and symptoms of infection given his recent chemotherapy.  Continues to spike temperatures intermittently but is improving and T Max was normal in the last 48 hours.  Chemotherapy associated Pancytopenia: WBC, Hgb/Hct, and Plt Count Trend: Recent Labs  Lab 06/25/24 1004 07/01/24 1454 07/05/24 1940 07/06/24 0620 07/07/24 0559 07/08/24 0953 07/09/24 0537   WBC 270.9* 132.3* 4.7 4.4 3.8* 3.0* 2.9*   Recent Labs  Lab 06/25/24 1004 07/01/24 1454 07/05/24 1940 07/06/24 0620 07/07/24 0559 07/08/24 0953 07/09/24 0537  HGB 11.3* 10.9* 6.9* 9.9* 10.3* 9.8* 9.5*  HCT 33.4* 33.1* 21.8* 29.6* 31.6* 29.8* 29.6*  Plt Count Trend:  Recent Labs  Lab 06/25/24 1004 07/01/24 1454 07/05/24 1940 07/06/24 0620 07/07/24 0559 07/08/24 0953 07/09/24 0537  PLT 43* 92* 16* 20* 14* 16* 22*  -Hemoglobin 6.9 and platelets 16 compared to previous Hgb 10.9 and PLT 92 on 07/01/2024.  Patient denies any obvious bleeding. -Transfuse 1 unit PRBC on 9/16 -Transfuse 1 unit of platelets on 9/16, 9/17, 9/18 and I spoke with Dr. Tina who recommends transfusing the patient another 1 unit of platelets prior to discharge -Avoiding all anticoagulation - CTM for S/Sx of Bleeding; No overt bleeding noted.  Per Oncology keep hemoglobin above 8 and platelet counts above 20,000.   Transient Confusion: Resolved.  In the setting of Above. Patient reports transient episode of confusion prior to arrival.  He feels close to his baseline now.  Given new thrombocytopenia will obtain noncontrast head CT which showed No acute intracranial abnormality but did show  Age-related atrophy and mild periventricular white matter disease and Mineralization in the right thalamus related to remote infarct.  Hypomagnesemia: Mag Level is now 1.8 and repleted prior to discharge. CTM and Replete as Necessary. Repeat CMP within 1 week  Hypophosphatemia: Mild. Phos Level is now 2.8. Replete w/ po  K Phos  500 mg po BID x2. CTM and Replete as Necessary. Repeat Phos Level in the AM   Metabolic Acidosis: Mild. Has a CO2 of 19, AG  of 12, Chloride Level of 112. CTM and Trend and repeat CMP within 1 week   Chronic Lymphocytic Leukemia: Primary oncologist is Dr. Sherrod.  Patient recently found to have recurrence and started on treatment with Gazyva , last infusion 9/12 with plan to start venetoclax  on 9/25.   WBC is now 2.9 from 132.3 on 9/11. Abx w/ IV Cefepime  an IV Vancomycin  as above now being changed to orals   Essential Hypertension: Blood pressure stable.  Was Holding amlodipine  and Coreg  pending head CT but now have resumed. CTM BP per Protocol. Last BP reading was 128/78   History of Hemorrhagic CVA: Appears to have some residual word finding difficulty. 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  Hyperbilirubinemia: T Bili Trend Improving:  Recent Labs  Lab 06/24/24 0844 06/25/24 1004 07/01/24 1454 07/05/24 1940 07/07/24 0559 07/08/24 0953 07/09/24 0537  BILITOT 2.6* 5.2* 1.2 2.3* 3.3* 2.4* 1.7*  -CTM and Trend and repeat CMP within 1 week  Hypoalbuminemia: Patient's Albumin Trend: Recent Labs  Lab 06/24/24 0844 06/25/24 1004 07/01/24 1454 07/05/24 1940 07/07/24 0559 07/08/24 0953 07/09/24 0537  ALBUMIN 4.2 4.4 4.1 3.4* 3.0* 3.0* 2.9*  -Continue to Monitor and Trend and repeat CMP within 1 week

## 2024-07-06 NOTE — Progress Notes (Addendum)
 Aaron Gonzalez   DOB:[birth year]   FM#:[id number]      ASSESSMENT & PLAN:  Aaron Gonzalez is a 78 year old male patient with oncologic history significant for CLL and history of rectal CA.  Patient brought to ED on 9/15 with complaints of sepsis.  Medical Oncology/Dr. Sherrod following.  Sepsis Altered mentation -- Patient brought from SN[facility] with complaints of confusion, and fever.   -- Low grade temp on admission 99, currently afebrile -- Given IV cefepime  and vanco -- Continue to monitor fever curve.   CLL, relapsed/refractory -- Diagnosed in 2007 -- Status post previous therapy with Ibrutinib , then Acalabrutinib . Discontinued 05/2024.  -- Started on new therapy with Obinutuzumab  + Venetoclax  on 9/5.  Plan is to give venetoclax  with infusion for first 6 months.   Patient states he has not taken the oral chemo because they don't have it at the facility.  -- Medical Oncology/Dr. Sherrod following  Anemia Thrombocytopenia -- Secondary to CLL -- Low hemoglobin 6.9 on arrival, status post PRBC transfusions. -- HGB with good response 9.9 today -- Transfuse PRBC for hemoglobin <7.0. -- Low platelets 20K today -- Transfuse platelets for counts <20K -- Continue to monitor CBC with differential    Code Status Full   Subjective:  Patient seen awake and alert laying in bed in the ED.  States that he was at Facility and rang the bell and they brought him here.  He does not recall exactly why he is here.  Denies chest pain or other acute pain, denies bleeding, nausea, vomiting or other GI symptoms.  Not confused at this time, he is aware of person, place, and time, current president.  Bil LE edema noted, states it's been like that for a long time.    Objective:   Intake/Output Summary (Last 24 hours) at 07/06/2024 1103 Last data filed at 07/06/2024 0315 Gross per 24 hour  Intake 2178.1 ml  Output --  Net 2178.1 ml     PHYSICAL EXAMINATION: ECOG PERFORMANCE STATUS: 3 -  Symptomatic, >50% confined to bed  Vitals:   07/06/24 0728 07/06/24 1030  BP: (!) 146/88 (!) 159/96  Pulse: 81 (!) 101  Resp: (!) 26 19  Temp: 98.3 F (36.8 C)   SpO2: 96% 95%   There were no vitals filed for this visit.  GENERAL: alert, no distress and comfortable SKIN: skin color, texture, turgor are normal, no rashes or significant lesions EYES: normal, conjunctiva are pink and non-injected, sclera clear OROPHARYNX: no exudate, no erythema and lips, buccal mucosa, and tongue normal  NECK: supple, thyroid normal size, non-tender, without nodularity LYMPH: no palpable lymphadenopathy in the cervical, axillary or inguinal LUNGS: clear to auscultation and percussion with normal breathing effort HEART: regular rate & rhythm and no murmurs and +bilateral lower extremity edema 2+ ABDOMEN: abdomen soft, non-tender and normal bowel sounds MUSCULOSKELETAL: no cyanosis of digits and no clubbing  PSYCH: alert & oriented x 3 with fluent speech NEURO: no focal motor/sensory deficits   All questions were answered. The patient knows to call the clinic with any problems, questions or concerns.   The total time spent in the appointment was 40 minutes encounter with patient including review of chart and various tests results, discussions about plan of care and coordination of care plan  Olam JINNY Brunner, NP 07/06/2024 11:03 AM    Labs Reviewed:  Lab Results  Component Value Date   WBC 4.4 07/06/2024   HGB 9.9 (L) 07/06/2024   HCT 29.6 (  L) 07/06/2024   MCV 95.5 07/06/2024   PLT 20 (LL) 07/06/2024   Recent Labs    06/25/24 1004 07/01/24 1454 07/05/24 1940 07/06/24 0620  NA 146* 141 138 141  K 4.0 3.6 3.9 3.8  CL 112* 108 107 110  CO2 45* 40* 19* 19*  GLUCOSE 130* 89 124* 98  BUN 43* 15 18 14   CREATININE 1.21 1.06 0.87 0.71  CALCIUM  9.2 8.6* 8.5* 7.8*  GFRNONAA >60 >60 >60 >60  PROT 6.6 6.1* 5.2*  --   ALBUMIN 4.4 4.1 3.4*  --   AST 87* 47* 67*  --   ALT 36 36 24  --   ALKPHOS  78 73 62  --   BILITOT 5.2* 1.2 2.3*  --     Studies Reviewed:  CT HEAD WO CONTRAST Result Date: 07/06/2024 EXAM: CT HEAD WITHOUT CONTRAST 07/06/2024 06:15:00 AM TECHNIQUE: CT of the head was performed without the administration of intravenous contrast. Automated exposure control, iterative reconstruction, and/or weight based adjustment of the mA/kV was utilized to reduce the radiation dose to as low as reasonably achievable. COMPARISON: CT of the head dated 06/14/2021. CLINICAL HISTORY: Mental status change, unknown cause. Per pt's son he is confused. Code sepsis. Pt currently receiving chemo. FINDINGS: BRAIN AND VENTRICLES: No acute hemorrhage. No evidence of acute infarct. No hydrocephalus. No extra-axial collection. No mass effect or midline shift. Age-related atrophy and mild periventricular white matter disease. Mineralization present posterolaterally within the right thalamus related to remote infarct. ORBITS: No acute abnormality. SINUSES: No acute abnormality. SOFT TISSUES AND SKULL: No acute soft tissue abnormality. No skull fracture. Calcifications within the carotid siphons. IMPRESSION: 1. No acute intracranial abnormality. 2. Age-related atrophy and mild periventricular white matter disease. 3. 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 Electronically signed by: Evalene Coho MD 07/06/2024 06:29 AM EDT RP Workstation: [id number]   CT CHEST ABDOMEN PELVIS W CONTRAST Result Date: 07/05/2024 CLINICAL DATA:  Sepsis Febrile. Pt recently underwent surgery for rectal cancer. Currently receiving Chemo. Pt denies any pain EXAM: CT CHEST, ABDOMEN, AND PELVIS WITH CONTRAST TECHNIQUE: Multidetector CT imaging of the chest, abdomen and pelvis was performed following the standard protocol during bolus administration of intravenous contrast. RADIATION DOSE REDUCTION: This exam was performed according to the departmental dose-optimization program which includes automated exposure  control, adjustment of the mA and/or kV according to patient size and/or use of iterative reconstruction technique. CONTRAST:  OMNIPAQUE  IOHEXOL  300 MG/ML  SOLN COMPARISON:  CT chest abdomen pelvis 01/06/2024 FINDINGS: CT CHEST FINDINGS Cardiovascular: Normal heart size. No significant pericardial effusion. The thoracic aorta is normal in caliber. Aberrant right subclavian artery. Mild atherosclerotic plaque of the thoracic aorta. Left anterior descending coronary artery calcifications. Mediastinum/Nodes: No enlarged mediastinal, hilar, or axillary lymph nodes. Thyroid gland, trachea, and esophagus demonstrate no significant findings. Tiny hiatal hernia. Lungs/Pleura: No focal consolidation. No pulmonary nodule. No pulmonary mass. No pleural effusion. No pneumothorax. Musculoskeletal: No chest wall abnormality. No suspicious lytic or blastic osseous lesions. No acute displaced fracture. Multilevel degenerative changes of the spine. CT ABDOMEN PELVIS FINDINGS Hepatobiliary: The liver is enlarged measuring up to 23 cm. No focal liver abnormality. No gallstones, gallbladder wall thickening, or pericholecystic fluid. No biliary dilatation. Pancreas: No focal lesion. Normal pancreatic contour. No surrounding inflammatory changes. No main pancreatic ductal dilatation. Spleen: The spleen is enlarged measuring up to 14 cm. No focal splenic lesion. Adrenals/Urinary Tract: No adrenal nodule bilaterally. Bilateral kidneys enhance symmetrically. Fluid density lesions of the  kidneys likely represent simple renal cysts. Simple renal cysts, in the absence of clinically indicated signs/symptoms, require no independent follow-up. No hydronephrosis. No hydroureter. Punctate right nephrolithiasis. No left nephrolithiasis. No ureterolithiasis bilaterally. The urinary bladder is not well visualized due to streak artifact originating from bilateral femoral surgical hardware. Stomach/Bowel: Left lower quadrant end colostomy. Stomach  is within normal limits. No evidence of bowel wall thickening or dilatation. Vascular/Lymphatic: No abdominal aorta or iliac aneurysm. Mild atherosclerotic plaque of the aorta and its branches. No abdominal, pelvic, or inguinal lymphadenopathy. Reproductive: Not visualized due to streak artifact originating from bilateral femoral surgical hardware. Other: No intraperitoneal free fluid. No intraperitoneal free gas. No organized fluid collection. Musculoskeletal: No abdominal wall hernia or abnormality. No suspicious lytic or blastic osseous lesions. No acute displaced fracture. Multilevel degenerative changes of the spine. Total bilateral hip arthroplasty. IMPRESSION: 1. No acute intrathoracic abnormality. 2. Limited evaluation of the pelvis due to streak artifact originating from bilateral femoral surgical hardware. 3. Hepatosplenomegaly. 4. Nonobstructive punctate right nephrolithiasis. 5.  Aortic Atherosclerosis (ICD10-I70.0). 6. Incidentally noted aberrant right subclavian artery. Electronically Signed   By: Morgane  Naveau M.D.   On: 07/05/2024 23:39   DG Chest Port 1 View Result Date: 07/05/2024 CLINICAL DATA:  Questionable sepsis - evaluate for abnormality EXAM: PORTABLE CHEST 1 VIEW COMPARISON:  Chest x-ray 04/01/2021 FINDINGS: The heart and mediastinal contours are unchanged. Atherosclerotic plaque. No focal consolidation. No pulmonary edema. No pleural effusion. No pneumothorax. No acute osseous abnormality. IMPRESSION: No active disease. Electronically Signed   By: Morgane  Naveau M.D.   On: 07/05/2024 19:38   ADDENDUM: Hematology/Oncology Attending: The patient is seen and examined today.  I agree with the above note.  Is a very pleasant 78 years old African-American male with relapsed chronic lymphocytic leukemia that was initially diagnosed in 2007 and treated since that time until November 2024 with ibrutinib .  This was discontinued secondary to disease progression and the patient restarted  treatment with acalabrutinib  in November 2024 but this was discontinued in August 2025 secondary to disease progression with significant increase in his total white blood count in addition to anemia and thrombocytopenia. The patient started treatment with infusion of obinutuzumab  weekly during the first cycle and he received 2 doses so far.  He is also scheduled to start treatment with venetoclax .  The patient has mental status change and low-grade fever and he presented to the emergency department for evaluation with significant fatigue and weakness.  CBC showed hemoglobin was down to 6.9 and platelets count of 20,000.  The patient received PRBCs transfusion and he is feeling a little bit better.  He continues to have thrombocytopenia. I recommended for the patient to continue his current supportive care with PRBCs transfusion to keep his hemoglobin above 8 and platelets count above 20,000. He had significant improvement in his total white blood count which has been normal recently down from over 300,000 few weeks ago. He will need to continue on allopurinol  and IV hydration for the possibility of tumor lysis syndrome. Thank you for taking good care of Mr. Obed, will continue to follow-up the patient with you and assist in his management on as-needed basis. He will have a follow-up visit with us  after discharge. Disclaimer: This note was dictated with voice recognition software. Similar sounding words can inadvertently be transcribed and may be missed upon review. Sherrod MARLA Sherrod, MD

## 2024-07-06 NOTE — Progress Notes (Signed)
 PROGRESS NOTE    Aaron Gonzalez  FMW:[id number] DOB: Mar 09, 1946 DOA: 07/05/2024 PCP: [name], MD   Brief Narrative:  Aaron Gonzalez is a 78 y.o. 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  Hospitalization has been complicated by fever  Assessment and Plan:  SIRS: Patient has no leukocytosis but has been febrile, tachycardic and tachypneic.  WBC is now 4.4.  COVID, RSV, influenza A and B are negative.  CT of the chest abdomen pelvis showed no acute intrathoracic abnormality but there was Limited evaluation of the pelvis due to streak artifact originating from bilateral femoral surgical hardware.  UA was relatively unremarkable and blood cultures x 2 pending.  Urine culture pending as well.  Chest x-ray done and showed no active disease.  Given his immunocompromise state will continue empiric IV antibiotics with IV cefepime  and IV vancomycin .  Continue monitor for further signs and symptoms of infection given his recent chemotherapy.  Continues to spike temperatures intermittently and has a Tmax of 102.3  Chemotherapy associated anemia/thrombocytopenia: Hgb/Hct and Plt Count Trend:  Recent Labs  Lab 06/10/24 1019 06/24/24 0844 06/25/24 1004 07/01/24 1454 07/05/24 1940 07/06/24 0620  HGB 12.8* 11.3* 11.3* 10.9* 6.9* 9.9*  HCT 39.2 34.6* 33.4* 33.1* 21.8* 29.6*  Plt Count Trend:  Recent Labs  Lab 06/10/24 1019 06/24/24 0844 06/25/24 1004 07/01/24 1454 07/05/24 1940 07/06/24 0620  PLT 137* 83* 43* 92* 16* 20*  -Hemoglobin 6.9 and platelets 16 compared to previous Hgb 10.9 and PLT 92 on 07/01/2024.  Patient denies any obvious bleeding. - Transfuse 1 unit PRBC, transfusion in process at time of admission - Transfuse 1 unit of platelets -  Avoiding all anticoagulation - CTM for S/Sx of Bleeding; No overt bleeding noted.  Per oncology keep hemoglobin above 8 and platelet counts above 20,000.   Transient Confusion: In the setting of Above. Patient reports transient episode of confusion prior to arrival.  He feels close to his baseline now.  Given new thrombocytopenia will obtain noncontrast head CT which showed  No acute intracranial abnormality but did show  Age-related atrophy and mild periventricular white matter disease and Mineralization in the right thalamus related to remote infarct.   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  Patient recently found to have recurrence and started on treatment with Gazyva , last infusion 9/12 with plan to start venetoclax  on 9/25.  WBC is improved to 4.4 from 132.3 on 9/11. Abx w/ IV Cefepime  an IV Vancomycin  as above   Essential Hypertension: Blood pressure stable.  Holding amlodipine  and Coreg  pending head CT.   History of hemorrhagic CVA: Appears to have some residual word finding difficulty. 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    DVT prophylaxis: SCDs Start: 07/06/24 0024    Code Status: Full Code Family Communication: No family present @ bedside   Disposition Plan:  Level of care: Telemetry Status is: Observation The patient will require care spanning > 2 midnights and should be moved to inpatient because: Continues to Spike Temperatures   Consultants:  Medical Oncology   Procedures:  As delineated as above  Antimicrobials:  Anti-infectives (From admission, onward)    Start     Dose/Rate Route Frequency Ordered Stop   07/06/24 2000  vancomycin  (VANCOREADY) IVPB 1250 mg/250 mL        1,250 mg 166.7 mL/hr over 90 Minutes Intravenous Every 24 hours 07/06/24 1520     07/06/24 1600  ceFEPIme  (MAXIPIME ) 2 g in sodium chloride  0.9 % 100 mL IVPB        2 g 200 mL/hr over 30 Minutes  Intravenous Every 8 hours 07/06/24 1513     07/06/24 1545  ceFEPIme  (MAXIPIME ) 2 g in sodium chloride  0.9 % 100 mL IVPB  Status:  Discontinued        2 g 200 mL/hr over 30 Minutes Intravenous Every 8 hours 07/06/24 1451 07/06/24 1513   07/06/24 1000  acyclovir  (ZOVIRAX ) tablet 400 mg        400 mg Oral Daily 07/06/24 0035     07/05/24 1915  ceFEPIme  (MAXIPIME ) 2 g in sodium chloride  0.9 % 100 mL IVPB        2 g 200 mL/hr over 30 Minutes Intravenous  Once 07/05/24 1902 07/05/24 2007   07/05/24 1915  metroNIDAZOLE  (FLAGYL ) IVPB 500 mg        500 mg 100 mL/hr over 60 Minutes Intravenous  Once 07/05/24 1902 07/05/24 2053   07/05/24 1915  vancomycin  (VANCOCIN ) IVPB 1000 mg/200 mL premix  Status:  Discontinued        1,000 mg 200 mL/hr over 60 Minutes Intravenous  Once 07/05/24 1902 07/05/24 1903   07/05/24 1915  vancomycin  (VANCOREADY) IVPB 1500 mg/300 mL        1,500 mg 150 mL/hr over 120 Minutes Intravenous  Once 07/05/24 1903 07/05/24 2239       Subjective: In and examined at bedside and was doing a little bit better however subsequently spiked a temperature related to his afternoon.  Complains of chronic leg swelling.  No other concerns or complaints at this time.  Feels fatigued and tired  Objective: Vitals:   07/06/24 1452 07/06/24 1621 07/06/24 2011 07/06/24 2145  BP: (!) 142/81 137/89 (!) 141/94   Pulse: 95 (!) 110 96   Resp: 20 18 18    Temp: (!) 102.2 F (39 C) 100.2 F (37.9 C) (!) 102.3 F (39.1 C) 100.1 F (37.8 C)  TempSrc:  Oral Oral Oral  SpO2: (!) 76% 96% 95%   Weight:  70.5 kg    Height:  5' 5 (1.651 m)      Intake/Output Summary (Last 24 hours) at 07/06/2024 2145 Last data filed at 07/06/2024 1717 Gross per 24 hour  Intake 3406.68 ml  Output 300 ml  Net 3106.68 ml   Filed Weights   07/06/24 1621  Weight: 70.5 kg   Examination: Physical Exam:  Constitutional: WN/WD overweight AAM in NAD Respiratory: Diminished to auscultation bilaterally, no  wheezing, rales, rhonchi or crackles. Normal respiratory effort and patient is not tachypenic. No accessory muscle use. Unlabored Breathing  Cardiovascular: RRR, no murmurs / rubs / gallops. S1 and S2 auscultated. 2+ LE Pitting edma Abdomen: Soft, non-tender, non-distended. No masses palpated. No appreciable hepatosplenomegaly. Bowel sounds positive.  GU: Deferred. Musculoskeletal: No clubbing / cyanosis of digits/nails. No joint deformity upper and lower extremities.  Skin: No rashes, lesions, ulcers on a limited skin evaluation. No induration; Warm and dry.  Neurologic: CN 2-12 grossly intact with no focal deficits. Romberg sign and cerebellar reflexes not assessed.  Psychiatric: Normal judgment and insight. Alert and oriented x 3. Normal mood and appropriate affect.   Data Reviewed: I have personally reviewed following labs and imaging studies  CBC: Recent  Labs  Lab 07/01/24 1454 07/05/24 1940 07/06/24 0620  WBC 132.3* 4.7 4.4  NEUTROABS 10.6* 3.0  --   HGB 10.9* 6.9* 9.9*  HCT 33.1* 21.8* 29.6*  MCV 96.5 98.6 95.5  PLT 92* 16* 20*   Basic Metabolic Panel: Recent Labs  Lab 07/01/24 1454 07/05/24 1940 07/06/24 0620  NA 141 138 141  K 3.6 3.9 3.8  CL 108 107 110  CO2 40* 19* 19*  GLUCOSE 89 124* 98  BUN 15 18 14   CREATININE 1.06 0.87 0.71  CALCIUM  8.6* 8.5* 7.8*   GFR: Estimated Creatinine Clearance: 66.2 mL/min (by C-G formula based on SCr of 0.71 mg/dL). Liver Function Tests: Recent Labs  Lab 07/01/24 1454 07/05/24 1940  AST 47* 67*  ALT 36 24  ALKPHOS 73 62  BILITOT 1.2 2.3*  PROT 6.1* 5.2*  ALBUMIN 4.1 3.4*   No results for input(s): LIPASE, AMYLASE in the last 168 hours. No results for input(s): AMMONIA in the last 168 hours. Coagulation Profile: Recent Labs  Lab 07/05/24 1940  INR 1.3*   Cardiac Enzymes: No results for input(s): CKTOTAL, CKMB, CKMBINDEX, TROPONINI in the last 168 hours. BNP (last 3 results) No results for input(s):  PROBNP in the last 8760 hours. HbA1C: No results for input(s): HGBA1C in the last 72 hours. CBG: No results for input(s): GLUCAP in the last 168 hours. Lipid Profile: No results for input(s): CHOL, HDL, LDLCALC, TRIG, CHOLHDL, LDLDIRECT in the last 72 hours. Thyroid Function Tests: No results for input(s): TSH, T4TOTAL, FREET4, T3FREE, THYROIDAB in the last 72 hours. Anemia Panel: No results for input(s): VITAMINB12, FOLATE, FERRITIN, TIBC, IRON, RETICCTPCT in the last 72 hours. Sepsis Labs: Recent Labs  Lab 07/05/24 1947 07/05/24 2133  LATICACIDVEN 1.4 1.2   Recent Results (from the past 240 hours)  Blood Culture (routine x 2)     Status: None (Preliminary result)   Collection Time: 07/05/24  7:02 PM   Specimen: BLOOD  Result Value Ref Range Status   Specimen Description   Final    BLOOD RIGHT ANTECUBITAL Performed at [facility], [address], [place], [place] [address]    Special Requests   Final    Blood Culture results may not be optimal due to an inadequate volume of blood received in culture bottles BOTTLES DRAWN AEROBIC AND ANAEROBIC Performed at [facility], [address], [place], [place] [address]    Culture   Final    NO GROWTH < 12 HOURS Performed at [facility], [address] [place], [place] [address]    Report Status PENDING  Incomplete  Resp panel by RT-PCR (RSV, Flu A&B, Covid) Anterior Nasal Swab     Status: None   Collection Time: 07/05/24  7:40 PM   Specimen: Anterior Nasal Swab  Result Value Ref Range Status   SARS Coronavirus 2 by RT PCR NEGATIVE NEGATIVE Final    Comment: (NOTE) SARS-CoV-2 target nucleic acids are NOT DETECTED.  The SARS-CoV-2 RNA is generally detectable in upper respiratory specimens during the acute phase of infection. The lowest concentration of SARS-CoV-2 viral copies this assay can detect is 138 copies/mL. A negative result does not  preclude SARS-Cov-2 infection and should not be used as the sole basis for treatment or other patient management decisions. A negative result may occur with  improper specimen collection/handling, submission of specimen other than nasopharyngeal swab, presence of viral mutation(s) within the areas targeted by this assay, and inadequate number of  viral copies(<138 copies/mL). A negative result must be combined with clinical observations, patient history, and epidemiological information. The expected result is Negative.  Fact Sheet for Patients:  BloggerCourse.com  Fact Sheet for Healthcare Providers:  SeriousBroker.it  This test is no t yet approved or cleared by the [place]  FDA and  has been authorized for detection and/or diagnosis of SARS-CoV-2 by FDA under an Emergency Use Authorization (EUA). This EUA will remain  in effect (meaning this test can be used) for the duration of the COVID-19 declaration under Section 564(b)(1) of the Act, 21 U.S.C.section 360bbb-3(b)(1), unless the authorization is terminated  or revoked sooner.       Influenza A by PCR NEGATIVE NEGATIVE Final   Influenza B by PCR NEGATIVE NEGATIVE Final    Comment: (NOTE) The Xpert Xpress SARS-CoV-2/FLU/RSV plus assay is intended as an aid in the diagnosis of influenza from Nasopharyngeal swab specimens and should not be used as a sole basis for treatment. Nasal washings and aspirates are unacceptable for Xpert Xpress SARS-CoV-2/FLU/RSV testing.  Fact Sheet for Patients: BloggerCourse.com  Fact Sheet for Healthcare Providers: SeriousBroker.it  This test is not yet approved or cleared by the [place]  FDA and has been authorized for detection and/or diagnosis of SARS-CoV-2 by FDA under an Emergency Use Authorization (EUA). This EUA will remain in effect (meaning this test can be used) for the  duration of the COVID-19 declaration under Section 564(b)(1) of the Act, 21 U.S.C. section 360bbb-3(b)(1), unless the authorization is terminated or revoked.     Resp Syncytial Virus by PCR NEGATIVE NEGATIVE Final    Comment: (NOTE) Fact Sheet for Patients: BloggerCourse.com  Fact Sheet for Healthcare Providers: SeriousBroker.it  This test is not yet approved or cleared by the [place]  FDA and has been authorized for detection and/or diagnosis of SARS-CoV-2 by FDA under an Emergency Use Authorization (EUA). This EUA will remain in effect (meaning this test can be used) for the duration of the COVID-19 declaration under Section 564(b)(1) of the Act, 21 U.S.C. section 360bbb-3(b)(1), unless the authorization is terminated or revoked.  Performed at [facility], [address], [place], [place] [address]   Blood Culture (routine x 2)     Status: None (Preliminary result)   Collection Time: 07/05/24  7:40 PM   Specimen: BLOOD LEFT ARM  Result Value Ref Range Status   Specimen Description BLOOD LEFT ARM  Final   Special Requests   Final    BOTTLES DRAWN AEROBIC AND ANAEROBIC Blood Culture results may not be optimal due to an inadequate volume of blood received in culture bottles   Culture   Final    NO GROWTH < 12 HOURS Performed at [facility], [address], [place], [place] [address]    Report Status PENDING  Incomplete    Radiology Studies: CT HEAD WO CONTRAST Result Date: 07/06/2024 EXAM: CT HEAD WITHOUT CONTRAST 07/06/2024 06:15:00 AM TECHNIQUE: CT of the head was performed without the administration of intravenous contrast. Automated exposure control, iterative reconstruction, and/or weight based adjustment of the mA/kV was utilized to reduce the radiation dose to as low as reasonably achievable. COMPARISON: CT of the head dated 06/14/2021. CLINICAL HISTORY: Mental status change, unknown cause.  Per pt's son he is confused. Code sepsis. Pt currently receiving chemo. FINDINGS: BRAIN AND VENTRICLES: No acute hemorrhage. No evidence of acute infarct. No hydrocephalus. No extra-axial collection. No mass effect or midline shift. Age-related atrophy and mild periventricular white matter disease. Mineralization  present posterolaterally within the right thalamus related to remote infarct. ORBITS: No acute abnormality. SINUSES: No acute abnormality. SOFT TISSUES AND SKULL: No acute soft tissue abnormality. No skull fracture. Calcifications within the carotid siphons. IMPRESSION: 1. No acute intracranial abnormality. 2. Age-related atrophy and mild periventricular white matter disease. 3. 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 Electronically signed by: Evalene Coho MD 07/06/2024 06:29 AM EDT RP Workstation: [id number]   CT CHEST ABDOMEN PELVIS W CONTRAST Result Date: 07/05/2024 CLINICAL DATA:  Sepsis Febrile. Pt recently underwent surgery for rectal cancer. Currently receiving Chemo. Pt denies any pain EXAM: CT CHEST, ABDOMEN, AND PELVIS WITH CONTRAST TECHNIQUE: Multidetector CT imaging of the chest, abdomen and pelvis was performed following the standard protocol during bolus administration of intravenous contrast. RADIATION DOSE REDUCTION: This exam was performed according to the departmental dose-optimization program which includes automated exposure control, adjustment of the mA and/or kV according to patient size and/or use of iterative reconstruction technique. CONTRAST:  OMNIPAQUE  IOHEXOL  300 MG/ML  SOLN COMPARISON:  CT chest abdomen pelvis 01/06/2024 FINDINGS: CT CHEST FINDINGS Cardiovascular: Normal heart size. No significant pericardial effusion. The thoracic aorta is normal in caliber. Aberrant right subclavian artery. Mild atherosclerotic plaque of the thoracic aorta. Left anterior descending coronary artery calcifications. Mediastinum/Nodes: No enlarged mediastinal,  hilar, or axillary lymph nodes. Thyroid gland, trachea, and esophagus demonstrate no significant findings. Tiny hiatal hernia. Lungs/Pleura: No focal consolidation. No pulmonary nodule. No pulmonary mass. No pleural effusion. No pneumothorax. Musculoskeletal: No chest wall abnormality. No suspicious lytic or blastic osseous lesions. No acute displaced fracture. Multilevel degenerative changes of the spine. CT ABDOMEN PELVIS FINDINGS Hepatobiliary: The liver is enlarged measuring up to 23 cm. No focal liver abnormality. No gallstones, gallbladder wall thickening, or pericholecystic fluid. No biliary dilatation. Pancreas: No focal lesion. Normal pancreatic contour. No surrounding inflammatory changes. No main pancreatic ductal dilatation. Spleen: The spleen is enlarged measuring up to 14 cm. No focal splenic lesion. Adrenals/Urinary Tract: No adrenal nodule bilaterally. Bilateral kidneys enhance symmetrically. Fluid density lesions of the kidneys likely represent simple renal cysts. Simple renal cysts, in the absence of clinically indicated signs/symptoms, require no independent follow-up. No hydronephrosis. No hydroureter. Punctate right nephrolithiasis. No left nephrolithiasis. No ureterolithiasis bilaterally. The urinary bladder is not well visualized due to streak artifact originating from bilateral femoral surgical hardware. Stomach/Bowel: Left lower quadrant end colostomy. Stomach is within normal limits. No evidence of bowel wall thickening or dilatation. Vascular/Lymphatic: No abdominal aorta or iliac aneurysm. Mild atherosclerotic plaque of the aorta and its branches. No abdominal, pelvic, or inguinal lymphadenopathy. Reproductive: Not visualized due to streak artifact originating from bilateral femoral surgical hardware. Other: No intraperitoneal free fluid. No intraperitoneal free gas. No organized fluid collection. Musculoskeletal: No abdominal wall hernia or abnormality. No suspicious lytic or blastic  osseous lesions. No acute displaced fracture. Multilevel degenerative changes of the spine. Total bilateral hip arthroplasty. IMPRESSION: 1. No acute intrathoracic abnormality. 2. Limited evaluation of the pelvis due to streak artifact originating from bilateral femoral surgical hardware. 3. Hepatosplenomegaly. 4. Nonobstructive punctate right nephrolithiasis. 5.  Aortic Atherosclerosis (ICD10-I70.0). 6. Incidentally noted aberrant right subclavian artery. Electronically Signed   By: Morgane  Naveau M.D.   On: 07/05/2024 23:39   DG Chest Port 1 View Result Date: 07/05/2024 CLINICAL DATA:  Questionable sepsis - evaluate for abnormality EXAM: PORTABLE CHEST 1 VIEW COMPARISON:  Chest x-ray 04/01/2021 FINDINGS: The heart and mediastinal contours are unchanged. Atherosclerotic plaque. No focal consolidation. No pulmonary edema. No pleural  effusion. No pneumothorax. No acute osseous abnormality. IMPRESSION: No active disease. Electronically Signed   By: Morgane  Naveau M.D.   On: 07/05/2024 19:38   Scheduled Meds:  acyclovir   400 mg Oral Daily   allopurinol   300 mg Oral Daily   sodium chloride  flush  3 mL Intravenous Q12H   Continuous Infusions:  sodium chloride  75 mL/hr at 07/06/24 1700   ceFEPime  (MAXIPIME ) IV Stopped (07/06/24 1550)   vancomycin  1,250 mg (07/06/24 1945)    LOS: 0 days   Alejandro Marker, DO [facility] Available via Epic secure chat 7am-7pm After these hours, please refer to coverage provider listed on [email or web address] 07/06/2024, 9:45 PM

## 2024-07-07 DIAGNOSIS — C9112 Chronic lymphocytic leukemia of B-cell type in relapse: Secondary | ICD-10-CM | POA: Diagnosis present

## 2024-07-07 DIAGNOSIS — T451X5A Adverse effect of antineoplastic and immunosuppressive drugs, initial encounter: Secondary | ICD-10-CM | POA: Diagnosis present

## 2024-07-07 DIAGNOSIS — Z96642 Presence of left artificial hip joint: Secondary | ICD-10-CM | POA: Diagnosis present

## 2024-07-07 DIAGNOSIS — E872 Acidosis, unspecified: Secondary | ICD-10-CM | POA: Diagnosis present

## 2024-07-07 DIAGNOSIS — Z823 Family history of stroke: Secondary | ICD-10-CM | POA: Diagnosis not present

## 2024-07-07 DIAGNOSIS — Z8249 Family history of ischemic heart disease and other diseases of the circulatory system: Secondary | ICD-10-CM | POA: Diagnosis not present

## 2024-07-07 DIAGNOSIS — D696 Thrombocytopenia, unspecified: Secondary | ICD-10-CM | POA: Diagnosis present

## 2024-07-07 DIAGNOSIS — Z86718 Personal history of other venous thrombosis and embolism: Secondary | ICD-10-CM | POA: Diagnosis not present

## 2024-07-07 DIAGNOSIS — D61818 Other pancytopenia: Secondary | ICD-10-CM | POA: Diagnosis present

## 2024-07-07 DIAGNOSIS — D849 Immunodeficiency, unspecified: Secondary | ICD-10-CM | POA: Diagnosis present

## 2024-07-07 DIAGNOSIS — R6510 Systemic inflammatory response syndrome (SIRS) of non-infectious origin without acute organ dysfunction: Secondary | ICD-10-CM | POA: Diagnosis present

## 2024-07-07 DIAGNOSIS — D630 Anemia in neoplastic disease: Secondary | ICD-10-CM | POA: Diagnosis present

## 2024-07-07 DIAGNOSIS — Z8673 Personal history of transient ischemic attack (TIA), and cerebral infarction without residual deficits: Secondary | ICD-10-CM | POA: Diagnosis not present

## 2024-07-07 DIAGNOSIS — E8809 Other disorders of plasma-protein metabolism, not elsewhere classified: Secondary | ICD-10-CM | POA: Diagnosis present

## 2024-07-07 DIAGNOSIS — Z1152 Encounter for screening for COVID-19: Secondary | ICD-10-CM | POA: Diagnosis not present

## 2024-07-07 DIAGNOSIS — Z803 Family history of malignant neoplasm of breast: Secondary | ICD-10-CM | POA: Diagnosis not present

## 2024-07-07 DIAGNOSIS — R162 Hepatomegaly with splenomegaly, not elsewhere classified: Secondary | ICD-10-CM | POA: Diagnosis present

## 2024-07-07 DIAGNOSIS — D6959 Other secondary thrombocytopenia: Secondary | ICD-10-CM | POA: Diagnosis present

## 2024-07-07 DIAGNOSIS — Z791 Long term (current) use of non-steroidal anti-inflammatories (NSAID): Secondary | ICD-10-CM | POA: Diagnosis not present

## 2024-07-07 DIAGNOSIS — Z933 Colostomy status: Secondary | ICD-10-CM | POA: Diagnosis not present

## 2024-07-07 DIAGNOSIS — C9110 Chronic lymphocytic leukemia of B-cell type not having achieved remission: Secondary | ICD-10-CM | POA: Diagnosis not present

## 2024-07-07 DIAGNOSIS — R17 Unspecified jaundice: Secondary | ICD-10-CM | POA: Diagnosis present

## 2024-07-07 DIAGNOSIS — F17210 Nicotine dependence, cigarettes, uncomplicated: Secondary | ICD-10-CM | POA: Diagnosis present

## 2024-07-07 DIAGNOSIS — I10 Essential (primary) hypertension: Secondary | ICD-10-CM | POA: Diagnosis present

## 2024-07-07 DIAGNOSIS — D6481 Anemia due to antineoplastic chemotherapy: Secondary | ICD-10-CM | POA: Diagnosis not present

## 2024-07-07 LAB — COMPREHENSIVE METABOLIC PANEL WITH GFR
ALT: 23 U/L (ref 0–44)
AST: 66 U/L — ABNORMAL HIGH (ref 15–41)
Albumin: 3 g/dL — ABNORMAL LOW (ref 3.5–5.0)
Alkaline Phosphatase: 55 U/L (ref 38–126)
Anion gap: 13 (ref 5–15)
BUN: 18 mg/dL (ref 8–23)
CO2: 19 mmol/L — ABNORMAL LOW (ref 22–32)
Calcium: 7.8 mg/dL — ABNORMAL LOW (ref 8.9–10.3)
Chloride: 109 mmol/L (ref 98–111)
Creatinine, Ser: 0.76 mg/dL (ref 0.61–1.24)
GFR, Estimated: >60 mL/min
Glucose, Bld: 88 mg/dL (ref 70–99)
Potassium: 3.8 mmol/L (ref 3.5–5.1)
Sodium: 141 mmol/L (ref 135–145)
Total Bilirubin: 3.3 mg/dL — ABNORMAL HIGH (ref 0.0–1.2)
Total Protein: 4.8 g/dL — ABNORMAL LOW (ref 6.5–8.1)

## 2024-07-07 LAB — CBC WITH DIFFERENTIAL/PLATELET
Abs Immature Granulocytes: 0.03 K/uL (ref 0.00–0.07)
Basophils Absolute: 0 K/uL (ref 0.0–0.1)
Basophils Relative: 1 %
Eosinophils Absolute: 0.2 K/uL (ref 0.0–0.5)
Eosinophils Relative: 6 %
HCT: 31.6 % — ABNORMAL LOW (ref 39.0–52.0)
Hemoglobin: 10.3 g/dL — ABNORMAL LOW (ref 13.0–17.0)
Immature Granulocytes: 1 %
Lymphocytes Relative: 24 %
Lymphs Abs: 0.9 K/uL (ref 0.7–4.0)
MCH: 30.8 pg (ref 26.0–34.0)
MCHC: 32.6 g/dL (ref 30.0–36.0)
MCV: 94.6 fL (ref 80.0–100.0)
Monocytes Absolute: 0.3 K/uL (ref 0.1–1.0)
Monocytes Relative: 8 %
Neutro Abs: 2.3 K/uL (ref 1.7–7.7)
Neutrophils Relative %: 60 %
Platelets: 14 K/uL — CL (ref 150–400)
RBC: 3.34 MIL/uL — ABNORMAL LOW (ref 4.22–5.81)
RDW: 16.7 % — ABNORMAL HIGH (ref 11.5–15.5)
Smear Review: NORMAL
WBC: 3.8 K/uL — ABNORMAL LOW (ref 4.0–10.5)
nRBC: 0.8 % — ABNORMAL HIGH (ref 0.0–0.2)

## 2024-07-07 LAB — PREPARE PLATELET PHERESIS: Unit division: 0

## 2024-07-07 LAB — URINE CULTURE: Culture: NO GROWTH

## 2024-07-07 LAB — BPAM PLATELET PHERESIS
Blood Product Expiration Date: 202509182359
ISSUE DATE / TIME: 202509160130
Unit Type and Rh: 202509182359
Unit Type and Rh: 5100

## 2024-07-07 LAB — PHOSPHORUS: Phosphorus: 2.6 mg/dL (ref 2.5–4.6)

## 2024-07-07 LAB — MAGNESIUM: Magnesium: 1.6 mg/dL — ABNORMAL LOW (ref 1.7–2.4)

## 2024-07-07 MED ORDER — SODIUM CHLORIDE 0.9% IV SOLUTION: Freq: Once | INTRAVENOUS | Status: AC

## 2024-07-07 MED ORDER — VANCOMYCIN HCL 1500 MG/300ML IV SOLN
1500.0000 mg | INTRAVENOUS | Status: DC
  Administered 2024-07-07 – 2024-07-08 (×2): 1500 mg via INTRAVENOUS
  Filled 2024-07-07 (×3): qty 300

## 2024-07-07 MED ORDER — MAGNESIUM SULFATE 2 GM/50ML IV SOLN
2.0000 g | Freq: Once | INTRAVENOUS | Status: AC
  Administered 2024-07-07: 2 g via INTRAVENOUS
  Filled 2024-07-07: qty 50

## 2024-07-07 MED ORDER — CARVEDILOL 6.25 MG PO TABS
6.2500 mg | ORAL_TABLET | Freq: Two times a day (BID) | ORAL | Status: DC
  Administered 2024-07-07 – 2024-07-09 (×4): 6.25 mg via ORAL
  Filled 2024-07-07 (×4): qty 1

## 2024-07-07 MED ORDER — AMLODIPINE BESYLATE 10 MG PO TABS
10.0000 mg | ORAL_TABLET | Freq: Every day | ORAL | Status: DC
  Administered 2024-07-07 – 2024-07-09 (×3): 10 mg via ORAL
  Filled 2024-07-07 (×3): qty 1

## 2024-07-07 NOTE — Care Management Obs Status (Signed)
 MEDICARE OBSERVATION STATUS NOTIFICATION   Patient Details  Name: Aaron Gonzalez MRN: [id number] Date of Birth: 1946/06/07   Medicare Observation Status Notification Given:  Yes    Toy LITTIE Agar, RN 07/07/2024, 11:07 AM

## 2024-07-07 NOTE — Progress Notes (Signed)
 PHARMACY NOTE:  ANTIMICROBIAL RENAL DOSAGE ADJUSTMENT  Current antimicrobial regimen includes a mismatch between antimicrobial dosage and estimated renal function.  As per policy approved by the Pharmacy & Therapeutics and Medical Executive Committees, the antimicrobial dosage will be adjusted accordingly.  Current antimicrobial dosage: vancomycin  1250mg  IV q24h  Indication: sepsis  Renal Function:  Estimated Creatinine Clearance: 66.2 mL/min (by C-G formula based on SCr of 0.76 mg/dL). []      On intermittent HD, scheduled: []      On CRRT    Antimicrobial dosage has been changed to: vancomycin  1500mg  IV q24h (eAUC 498 using Scr 0.8 and Vd 0.72)  Additional comments: N/A   Thank you for allowing pharmacy to be a part of this patient's care.  Lacinda Moats, PharmD Clinical Pharmacist  9/17/20257:22 AM

## 2024-07-07 NOTE — Progress Notes (Signed)
 PROGRESS NOTE    Aaron Gonzalez  FMW:[id number] DOB: [birth year] DOA: 07/05/2024 PCP: [name], MD   Brief Narrative:  Aaron Gonzalez is a 78 y.o. 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  Hospitalization has been complicated by fever of unclear etiology. Has been placed on IV Vanc/Cefepime .   Assessment and Plan:  SIRS: Patient has no leukocytosis but has been febrile, tachycardic and tachypneic.  WBC is now 4.4.  COVID, RSV, influenza A and B are negative.  CT of the chest abdomen pelvis showed no acute intrathoracic abnormality but there was Limited evaluation of the pelvis due to streak artifact originating from bilateral femoral surgical hardware.  UA was relatively unremarkable and blood cultures x 2 showing NGTD @ 2 Days.  Urine culture pending as well.  Chest x-ray done and showed no active disease.  Given his immunocompromise state will continue empiric IV antibiotics with IV cefepime  and IV vancomycin .  Continue monitor for further signs and symptoms of infection given his recent chemotherapy.  Continues to spike temperatures intermittently and had a Tmax of 102.3 in the last 24 hours.   Chemotherapy associated Pancytopenia: WBC, Hgb/Hct, and Plt Count Trend: Recent Labs  Lab 06/10/24 1019 06/24/24 0844 06/25/24 1004 07/01/24 1454 07/05/24 1940 07/06/24 0620 07/07/24 0559  WBC 84.4* 302.2* 270.9* 132.3* 4.7 4.4 3.8*   Recent Labs  Lab 06/10/24 1019 06/24/24 0844 06/25/24 1004 07/01/24 1454 07/05/24 1940 07/06/24 0620 07/07/24 0559  HGB 12.8* 11.3* 11.3* 10.9* 6.9* 9.9* 10.3*  HCT 39.2 34.6* 33.4* 33.1* 21.8* 29.6* 31.6*  Plt Count Trend:  Recent Labs  Lab 06/10/24 1019 06/24/24 0844 06/25/24 1004 07/01/24 1454  07/05/24 1940 07/06/24 0620 07/07/24 0559  PLT 137* 83* 43* 92* 16* 20* 14*  -Hemoglobin 6.9 and platelets 16 compared to previous Hgb 10.9 and PLT 92 on 07/01/2024.  Patient denies any obvious bleeding. - Transfuse 1 unit PRBC on 9/16 - Transfuse 1 unit of platelets on 9/16 and again 9/17 - Avoiding all anticoagulation - CTM for S/Sx of Bleeding; No overt bleeding noted.  Per oncology keep hemoglobin above 8 and platelet counts above 20,000.   Transient Confusion: In the setting of Above. Patient reports transient episode of confusion prior to arrival.  He feels close to his baseline now.  Given new thrombocytopenia will obtain noncontrast head CT which showed No acute intracranial abnormality but did show  Age-related atrophy and mild periventricular white matter disease and Mineralization in the right thalamus related to remote infarct.  Hypomagnesemia: Mag Level is now 1.6. Replete w/ IV Mag Sulfate 2 grams. CTM and Replete as Necessary. Repeat CMP in the AM   Metabolic Acidosis: Mild. Has a CO2 of 19, AG of 13, Chloride Level of 109. CTM and Trend and repeat CMP in the AM   Chronic Lymphocytic Leukemia: Primary oncologist is Dr. Sherrod.  Patient recently found to have recurrence and started on treatment with Gazyva , last infusion 9/12 with plan to start venetoclax  on 9/25.  WBC is now 3.8  from 132.3 on 9/11. Abx w/ IV Cefepime  an IV Vancomycin  as above   Essential Hypertension: Blood pressure stable.  Was Holding amlodipine  and Coreg  pending head CT but have now improved.    History of hemorrhagic CVA: Appears to have some residual word finding difficulty.  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  Hyperbilirubinemia: T Bili Trend:  Recent Labs  Lab 06/10/24 1019 06/24/24 0844 06/25/24 1004 07/01/24 1454 07/05/24 1940 07/07/24 0559  BILITOT 1.2 2.6* 5.2* 1.2 2.3* 3.3*  -CTM and Trend and repeat CMP in the  AM  Hypoalbuminemia: Patient's Albumin Trend: Recent Labs  Lab 06/10/24 1019 06/24/24 0844 06/25/24 1004 07/01/24 1454 07/05/24 1940 07/07/24 0559  ALBUMIN 4.3 4.2 4.4 4.1 3.4* 3.0*  -Continue to Monitor and Trend and repeat CMP in the AM   DVT prophylaxis: SCDs Start: 07/06/24 0024    Code Status: Full Code Family Communication: No family present @ bedside  Disposition Plan:  Level of care: Telemetry Status is: Inpatient Remains inpatient appropriate because: Needs further clinical improvement    Consultants:  Medical Oncology  Procedures:  As delineated as above  Antimicrobials:  Anti-infectives (From admission, onward)    Start     Dose/Rate Route Frequency Ordered Stop   07/07/24 2000  vancomycin  (VANCOREADY) IVPB 1500 mg/300 mL        1,500 mg 150 mL/hr over 120 Minutes Intravenous Every 24 hours 07/07/24 0723     07/06/24 2000  vancomycin  (VANCOREADY) IVPB 1250 mg/250 mL  Status:  Discontinued        1,250 mg 166.7 mL/hr over 90 Minutes Intravenous Every 24 hours 07/06/24 1520 07/07/24 0723   07/06/24 1600  ceFEPIme  (MAXIPIME ) 2 g in sodium chloride  0.9 % 100 mL IVPB        2 g 200 mL/hr over 30 Minutes Intravenous Every 8 hours 07/06/24 1513     07/06/24 1545  ceFEPIme  (MAXIPIME ) 2 g in sodium chloride  0.9 % 100 mL IVPB  Status:  Discontinued        2 g 200 mL/hr over 30 Minutes Intravenous Every 8 hours 07/06/24 1451 07/06/24 1513   07/06/24 1000  acyclovir  (ZOVIRAX ) tablet 400 mg        400 mg Oral Daily 07/06/24 0035     07/05/24 1915  ceFEPIme  (MAXIPIME ) 2 g in sodium chloride  0.9 % 100 mL IVPB        2 g 200 mL/hr over 30 Minutes Intravenous  Once 07/05/24 1902 07/05/24 2007   07/05/24 1915  metroNIDAZOLE  (FLAGYL ) IVPB 500 mg        500 mg 100 mL/hr over 60 Minutes Intravenous  Once 07/05/24 1902 07/05/24 2053   07/05/24 1915  vancomycin  (VANCOCIN ) IVPB 1000 mg/200 mL premix  Status:  Discontinued        1,000 mg 200 mL/hr over 60 Minutes  Intravenous  Once 07/05/24 1902 07/05/24 1903   07/05/24 1915  vancomycin  (VANCOREADY) IVPB 1500 mg/300 mL        1,500 mg 150 mL/hr over 120 Minutes Intravenous  Once 07/05/24 1903 07/05/24 2239       Subjective: Seen and examined at bedside and he states he is doing okay.  Continues to spike intermittent fevers.  No lightheadedness or dizziness.  No burning or discomfort in his urine.  No diarrhea.  No other concerns or complaints at this time.  Objective: Vitals:   07/07/24 1136 07/07/24 1205 07/07/24 1206 07/07/24 1339  BP: (!) 158/86 (!) 156/88 (!) 156/88 (!) 158/82  Pulse: 93 97 97 95  Resp: 20 19 19 19   Temp: 98.9 F (37.2 C) 99.5 F (37.5 C) 99.5 F (37.5 C) 99.6 F (37.6 C)  TempSrc: Oral Oral Oral Oral  SpO2: 94%  92% 92%  Weight:      Height:        Intake/Output Summary (Last 24 hours) at 07/07/2024 1908 Last data filed at 07/07/2024 8177 Gross per 24 hour  Intake 2830.17 ml  Output 900 ml  Net 1930.17 ml   Filed Weights   07/06/24 1621  Weight: 70.5 kg   Examination: Physical Exam:  Constitutional: WN/WD overweight African-American male in no acute distress Respiratory: Diminished to auscultation bilaterally, no wheezing, rales, rhonchi or crackles. Normal respiratory effort and patient is not tachypenic. No accessory muscle use.  Unlabored breathing Cardiovascular: RRR, no murmurs / rubs / gallops. S1 and S2 auscultated.  Has 2+ lower extremity pitting edema Abdomen: Soft, non-tender, distended secondary body habitus.  Has an ostomy in place. Bowel sounds positive.  GU: Deferred. Musculoskeletal: No clubbing / cyanosis of digits/nails. No joint deformity upper and lower extremities. Skin: No rashes, lesions, ulcers on limited skin evaluation. No induration; Warm and dry.  Neurologic: CN 2-12 grossly intact with no focal deficits. Romberg sign and cerebellar reflexes not assessed.  Psychiatric: Normal judgment and insight. Alert and oriented x 3. Normal  mood and appropriate affect.   Data Reviewed: I have personally reviewed following labs and imaging studies  CBC: Recent Labs  Lab 07/01/24 1454 07/05/24 1940 07/06/24 0620 07/07/24 0559  WBC 132.3* 4.7 4.4 3.8*  NEUTROABS 10.6* 3.0  --  2.3  HGB 10.9* 6.9* 9.9* 10.3*  HCT 33.1* 21.8* 29.6* 31.6*  MCV 96.5 98.6 95.5 94.6  PLT 92* 16* 20* 14*   Basic Metabolic Panel: Recent Labs  Lab 07/01/24 1454 07/05/24 1940 07/06/24 0620 07/07/24 0559  NA 141 138 141 141  K 3.6 3.9 3.8 3.8  CL 108 107 110 109  CO2 40* 19* 19* 19*  GLUCOSE 89 124* 98 88  BUN 15 18 14 18   CREATININE 1.06 0.87 0.71 0.76  CALCIUM  8.6* 8.5* 7.8* 7.8*  MG  --   --   --  1.6*  PHOS  --   --   --  2.6   GFR: Estimated Creatinine Clearance: 66.2 mL/min (by C-G formula based on SCr of 0.76 mg/dL). Liver Function Tests: Recent Labs  Lab 07/01/24 1454 07/05/24 1940 07/07/24 0559  AST 47* 67* 66*  ALT 36 24 23  ALKPHOS 73 62 55  BILITOT 1.2 2.3* 3.3*  PROT 6.1* 5.2* 4.8*  ALBUMIN 4.1 3.4* 3.0*   No results for input(s): LIPASE, AMYLASE in the last 168 hours. No results for input(s): AMMONIA in the last 168 hours. Coagulation Profile: Recent Labs  Lab 07/05/24 1940  INR 1.3*   Cardiac Enzymes: No results for input(s): CKTOTAL, CKMB, CKMBINDEX, TROPONINI in the last 168 hours. BNP (last 3 results) No results for input(s): PROBNP in the last 8760 hours. HbA1C: No results for input(s): HGBA1C in the last 72 hours. CBG: No results for input(s): GLUCAP in the last 168 hours. Lipid Profile: No results for input(s): CHOL, HDL, LDLCALC, TRIG, CHOLHDL, LDLDIRECT in the last 72 hours. Thyroid Function Tests: No results for input(s): TSH, T4TOTAL, FREET4, T3FREE, THYROIDAB in the last 72 hours. Anemia Panel: No results for input(s): VITAMINB12, FOLATE, FERRITIN, TIBC, IRON, RETICCTPCT in the last 72 hours. Sepsis Labs: Recent Labs  Lab  07/05/24 1947 07/05/24 2133  LATICACIDVEN 1.4 1.2   Recent Results (from the past 240 hours)  Blood Culture (routine x 2)     Status: None (Preliminary result)   Collection Time: 07/05/24  7:02  PM   Specimen: BLOOD  Result Value Ref Range Status   Specimen Description   Final    BLOOD RIGHT ANTECUBITAL Performed at [facility], [address] [place], [place] [address]    Special Requests   Final    Blood Culture results may not be optimal due to an inadequate volume of blood received in culture bottles BOTTLES DRAWN AEROBIC AND ANAEROBIC Performed at [facility], [address], [place], [place] [address]    Culture   Final    NO GROWTH 2 DAYS Performed at [facility], [address] [place], [place] [address]    Report Status PENDING  Incomplete  Resp panel by RT-PCR (RSV, Flu A&B, Covid) Anterior Nasal Swab     Status: None   Collection Time: 07/05/24  7:40 PM   Specimen: Anterior Nasal Swab  Result Value Ref Range Status   SARS Coronavirus 2 by RT PCR NEGATIVE NEGATIVE Final    Comment: (NOTE) SARS-CoV-2 target nucleic acids are NOT DETECTED.  The SARS-CoV-2 RNA is generally detectable in upper respiratory specimens during the acute phase of infection. The lowest concentration of SARS-CoV-2 viral copies this assay can detect is 138 copies/mL. A negative result does not preclude SARS-Cov-2 infection and should not be used as the sole basis for treatment or other patient management decisions. A negative result may occur with  improper specimen collection/handling, submission of specimen other than nasopharyngeal swab, presence of viral mutation(s) within the areas targeted by this assay, and inadequate number of viral copies(<138 copies/mL). A negative result must be combined with clinical observations, patient history, and epidemiological information. The expected result is Negative.  Fact Sheet for Patients:   BloggerCourse.com  Fact Sheet for Healthcare Providers:  SeriousBroker.it  This test is no t yet approved or cleared by the [place]  FDA and  has been authorized for detection and/or diagnosis of SARS-CoV-2 by FDA under an Emergency Use Authorization (EUA). This EUA will remain  in effect (meaning this test can be used) for the duration of the COVID-19 declaration under Section 564(b)(1) of the Act, 21 U.S.C.section 360bbb-3(b)(1), unless the authorization is terminated  or revoked sooner.       Influenza A by PCR NEGATIVE NEGATIVE Final   Influenza B by PCR NEGATIVE NEGATIVE Final    Comment: (NOTE) The Xpert Xpress SARS-CoV-2/FLU/RSV plus assay is intended as an aid in the diagnosis of influenza from Nasopharyngeal swab specimens and should not be used as a sole basis for treatment. Nasal washings and aspirates are unacceptable for Xpert Xpress SARS-CoV-2/FLU/RSV testing.  Fact Sheet for Patients: BloggerCourse.com  Fact Sheet for Healthcare Providers: SeriousBroker.it  This test is not yet approved or cleared by the [place]  FDA and has been authorized for detection and/or diagnosis of SARS-CoV-2 by FDA under an Emergency Use Authorization (EUA). This EUA will remain in effect (meaning this test can be used) for the duration of the COVID-19 declaration under Section 564(b)(1) of the Act, 21 U.S.C. section 360bbb-3(b)(1), unless the authorization is terminated or revoked.     Resp Syncytial Virus by PCR NEGATIVE NEGATIVE Final    Comment: (NOTE) Fact Sheet for Patients: BloggerCourse.com  Fact Sheet for Healthcare Providers: SeriousBroker.it  This test is not yet approved or cleared by the [place]  FDA and has been authorized for detection and/or diagnosis of SARS-CoV-2 by FDA under an Emergency Use  Authorization (EUA). This EUA will remain in effect (meaning this test can be  used) for the duration of the COVID-19 declaration under Section 564(b)(1) of the Act, 21 U.S.C. section 360bbb-3(b)(1), unless the authorization is terminated or revoked.  Performed at [facility], [address] [place], [place] [address]   Blood Culture (routine x 2)     Status: None (Preliminary result)   Collection Time: 07/05/24  7:40 PM   Specimen: BLOOD LEFT ARM  Result Value Ref Range Status   Specimen Description BLOOD LEFT ARM  Final   Special Requests   Final    BOTTLES DRAWN AEROBIC AND ANAEROBIC Blood Culture results may not be optimal due to an inadequate volume of blood received in culture bottles   Culture   Final    NO GROWTH 2 DAYS Performed at [facility], [address], [place], [place] [address]    Report Status PENDING  Incomplete    Radiology Studies: CT HEAD WO CONTRAST Result Date: 07/06/2024 EXAM: CT HEAD WITHOUT CONTRAST 07/06/2024 06:15:00 AM TECHNIQUE: CT of the head was performed without the administration of intravenous contrast. Automated exposure control, iterative reconstruction, and/or weight based adjustment of the mA/kV was utilized to reduce the radiation dose to as low as reasonably achievable. COMPARISON: CT of the head dated 06/14/2021. CLINICAL HISTORY: Mental status change, unknown cause. Per pt's son he is confused. Code sepsis. Pt currently receiving chemo. FINDINGS: BRAIN AND VENTRICLES: No acute hemorrhage. No evidence of acute infarct. No hydrocephalus. No extra-axial collection. No mass effect or midline shift. Age-related atrophy and mild periventricular white matter disease. Mineralization present posterolaterally within the right thalamus related to remote infarct. ORBITS: No acute abnormality. SINUSES: No acute abnormality. SOFT TISSUES AND SKULL: No acute soft tissue abnormality. No skull fracture. Calcifications within the carotid  siphons. IMPRESSION: 1. No acute intracranial abnormality. 2. Age-related atrophy and mild periventricular white matter disease. 3. 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 Electronically signed by: Evalene Coho MD 07/06/2024 06:29 AM EDT RP Workstation: [id number]   CT CHEST ABDOMEN PELVIS W CONTRAST Result Date: 07/05/2024 CLINICAL DATA:  Sepsis Febrile. Pt recently underwent surgery for rectal cancer. Currently receiving Chemo. Pt denies any pain EXAM: CT CHEST, ABDOMEN, AND PELVIS WITH CONTRAST TECHNIQUE: Multidetector CT imaging of the chest, abdomen and pelvis was performed following the standard protocol during bolus administration of intravenous contrast. RADIATION DOSE REDUCTION: This exam was performed according to the departmental dose-optimization program which includes automated exposure control, adjustment of the mA and/or kV according to patient size and/or use of iterative reconstruction technique. CONTRAST:  OMNIPAQUE  IOHEXOL  300 MG/ML  SOLN COMPARISON:  CT chest abdomen pelvis 01/06/2024 FINDINGS: CT CHEST FINDINGS Cardiovascular: Normal heart size. No significant pericardial effusion. The thoracic aorta is normal in caliber. Aberrant right subclavian artery. Mild atherosclerotic plaque of the thoracic aorta. Left anterior descending coronary artery calcifications. Mediastinum/Nodes: No enlarged mediastinal, hilar, or axillary lymph nodes. Thyroid gland, trachea, and esophagus demonstrate no significant findings. Tiny hiatal hernia. Lungs/Pleura: No focal consolidation. No pulmonary nodule. No pulmonary mass. No pleural effusion. No pneumothorax. Musculoskeletal: No chest wall abnormality. No suspicious lytic or blastic osseous lesions. No acute displaced fracture. Multilevel degenerative changes of the spine. CT ABDOMEN PELVIS FINDINGS Hepatobiliary: The liver is enlarged measuring up to 23 cm. No focal liver abnormality. No gallstones, gallbladder wall  thickening, or pericholecystic fluid. No biliary dilatation. Pancreas: No focal lesion. Normal pancreatic contour. No surrounding inflammatory changes. No main pancreatic ductal dilatation. Spleen: The spleen is enlarged measuring up to 14 cm.  No focal splenic lesion. Adrenals/Urinary Tract: No adrenal nodule bilaterally. Bilateral kidneys enhance symmetrically. Fluid density lesions of the kidneys likely represent simple renal cysts. Simple renal cysts, in the absence of clinically indicated signs/symptoms, require no independent follow-up. No hydronephrosis. No hydroureter. Punctate right nephrolithiasis. No left nephrolithiasis. No ureterolithiasis bilaterally. The urinary bladder is not well visualized due to streak artifact originating from bilateral femoral surgical hardware. Stomach/Bowel: Left lower quadrant end colostomy. Stomach is within normal limits. No evidence of bowel wall thickening or dilatation. Vascular/Lymphatic: No abdominal aorta or iliac aneurysm. Mild atherosclerotic plaque of the aorta and its branches. No abdominal, pelvic, or inguinal lymphadenopathy. Reproductive: Not visualized due to streak artifact originating from bilateral femoral surgical hardware. Other: No intraperitoneal free fluid. No intraperitoneal free gas. No organized fluid collection. Musculoskeletal: No abdominal wall hernia or abnormality. No suspicious lytic or blastic osseous lesions. No acute displaced fracture. Multilevel degenerative changes of the spine. Total bilateral hip arthroplasty. IMPRESSION: 1. No acute intrathoracic abnormality. 2. Limited evaluation of the pelvis due to streak artifact originating from bilateral femoral surgical hardware. 3. Hepatosplenomegaly. 4. Nonobstructive punctate right nephrolithiasis. 5.  Aortic Atherosclerosis (ICD10-I70.0). 6. Incidentally noted aberrant right subclavian artery. Electronically Signed   By: Morgane  Naveau M.D.   On: 07/05/2024 23:39   DG Chest Port 1  View Result Date: 07/05/2024 CLINICAL DATA:  Questionable sepsis - evaluate for abnormality EXAM: PORTABLE CHEST 1 VIEW COMPARISON:  Chest x-ray 04/01/2021 FINDINGS: The heart and mediastinal contours are unchanged. Atherosclerotic plaque. No focal consolidation. No pulmonary edema. No pleural effusion. No pneumothorax. No acute osseous abnormality. IMPRESSION: No active disease. Electronically Signed   By: Morgane  Naveau M.D.   On: 07/05/2024 19:38   Scheduled Meds:  acyclovir   400 mg Oral Daily   allopurinol   300 mg Oral Daily   amLODipine   10 mg Oral Daily   carvedilol   6.25 mg Oral BID WC   sodium chloride  flush  3 mL Intravenous Q12H   Continuous Infusions:  sodium chloride  75 mL/hr at 07/07/24 1528   ceFEPime  (MAXIPIME ) IV 2 g (07/07/24 1633)   vancomycin       LOS: 0 days   Alejandro Marker, DO [facility] Available via Epic secure chat 7am-7pm After these hours, please refer to coverage provider listed on [email or web address] 07/07/2024, 7:08 PM

## 2024-07-07 NOTE — Consult Note (Signed)
 WOC Nurse ostomy consult note Stoma type/location: history of stoma, last seen by WOC nurses 04/02/2021 Patient needs supplies for care while inpatient, he and his family are independent in the care of the stoma.  Treatment options for stomal/peristomal skin: 2 barrier ring  Output see nursing flowsheets Ostomy pouching: 2pc. 2 1/4 with barrier ring Education provided: NA   Re consult if needed, will not follow at this time. Thanks  Hillel Card [employer], RN,CWOCN, CNS, [employer] [phone]

## 2024-07-07 NOTE — Progress Notes (Signed)
   07/07/24 1528  TOC Brief Assessment  Insurance and Status Reviewed  Patient has primary care physician Yes  Home environment has been reviewed [facility]  Prior level of function: Independent  Prior/Current Home Services No current home services  Social Drivers of Health Review SDOH reviewed no interventions necessary  Readmission risk has been reviewed Yes  Transition of care needs no transition of care needs at this time

## 2024-07-08 ENCOUNTER — Inpatient Hospital Stay: Admitting: Internal Medicine

## 2024-07-08 ENCOUNTER — Inpatient Hospital Stay

## 2024-07-08 DIAGNOSIS — D6481 Anemia due to antineoplastic chemotherapy: Secondary | ICD-10-CM | POA: Diagnosis not present

## 2024-07-08 DIAGNOSIS — C9110 Chronic lymphocytic leukemia of B-cell type not having achieved remission: Secondary | ICD-10-CM | POA: Diagnosis not present

## 2024-07-08 DIAGNOSIS — I10 Essential (primary) hypertension: Secondary | ICD-10-CM | POA: Diagnosis not present

## 2024-07-08 DIAGNOSIS — D6959 Other secondary thrombocytopenia: Secondary | ICD-10-CM | POA: Diagnosis not present

## 2024-07-08 LAB — COMPREHENSIVE METABOLIC PANEL WITH GFR
ALT: 20 U/L (ref 0–44)
AST: 52 U/L — ABNORMAL HIGH (ref 15–41)
Albumin: 3 g/dL — ABNORMAL LOW (ref 3.5–5.0)
Alkaline Phosphatase: 49 U/L (ref 38–126)
Anion gap: 11 (ref 5–15)
BUN: 16 mg/dL (ref 8–23)
CO2: 21 mmol/L — ABNORMAL LOW (ref 22–32)
Calcium: 7.9 mg/dL — ABNORMAL LOW (ref 8.9–10.3)
Chloride: 110 mmol/L (ref 98–111)
Creatinine, Ser: 0.68 mg/dL (ref 0.61–1.24)
GFR, Estimated: >60 mL/min (ref >60)
Glucose, Bld: 107 mg/dL — ABNORMAL HIGH (ref 70–99)
Potassium: 4.3 mmol/L (ref 3.5–5.1)
Sodium: 142 mmol/L (ref 135–145)
Total Bilirubin: 2.4 mg/dL — ABNORMAL HIGH (ref 0.0–1.2)
Total Protein: 4.9 g/dL — ABNORMAL LOW (ref 6.5–8.1)

## 2024-07-08 LAB — PREPARE PLATELET PHERESIS: Unit division: 0

## 2024-07-08 LAB — PHOSPHORUS: Phosphorus: 2 mg/dL — ABNORMAL LOW (ref 2.5–4.6)

## 2024-07-08 LAB — CBC WITH DIFFERENTIAL/PLATELET
Abs Immature Granulocytes: 0.02 K/uL (ref 0.00–0.07)
Basophils Absolute: 0 K/uL (ref 0.0–0.1)
Basophils Relative: 1 %
Eosinophils Absolute: 0.3 K/uL (ref 0.0–0.5)
Eosinophils Relative: 9 %
HCT: 29.8 % — ABNORMAL LOW (ref 39.0–52.0)
Hemoglobin: 9.8 g/dL — ABNORMAL LOW (ref 13.0–17.0)
Immature Granulocytes: 1 %
Lymphocytes Relative: 30 %
Lymphs Abs: 0.9 K/uL (ref 0.7–4.0)
MCH: 31.4 pg (ref 26.0–34.0)
MCHC: 32.9 g/dL (ref 30.0–36.0)
MCV: 95.5 fL (ref 80.0–100.0)
Monocytes Absolute: 0.2 K/uL (ref 0.1–1.0)
Monocytes Relative: 8 %
Neutro Abs: 1.5 K/uL — ABNORMAL LOW (ref 1.7–7.7)
Neutrophils Relative %: 51 %
Platelets: 16 K/uL — CL (ref 150–400)
RBC: 3.12 MIL/uL — ABNORMAL LOW (ref 4.22–5.81)
RDW: 17.2 % — ABNORMAL HIGH (ref 11.5–15.5)
WBC: 3 K/uL — ABNORMAL LOW (ref 4.0–10.5)
nRBC: 0 % (ref 0.0–0.2)

## 2024-07-08 LAB — BPAM PLATELET PHERESIS
Blood Product Expiration Date: 202509192359
ISSUE DATE / TIME: 202509171144
Unit Type and Rh: 7300

## 2024-07-08 LAB — MAGNESIUM: Magnesium: 2 mg/dL (ref 1.7–2.4)

## 2024-07-08 MED ORDER — SODIUM CHLORIDE 0.9% IV SOLUTION
Freq: Once | INTRAVENOUS | Status: AC
Start: 2024-07-08 — End: 2024-07-08

## 2024-07-08 MED ORDER — K PHOS MONO-SOD PHOS DI & MONO 155-852-130 MG PO TABS
500.0000 mg | ORAL_TABLET | Freq: Two times a day (BID) | ORAL | Status: AC
  Administered 2024-07-08 (×2): 500 mg via ORAL
  Filled 2024-07-08 (×2): qty 2

## 2024-07-08 NOTE — Evaluation (Signed)
 Occupational Therapy Evaluation Patient Details Name: Aaron Gonzalez MRN: [id number] DOB: 1946-01-15 Today's Date: 07/08/2024   History of Present Illness   Pt is a 78 y/o male presenting from ILF with transient confusion. Admitted for  thrombocytopenia, anemia and SIRS. 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 Pt reports active at baseline and plays chair volleyball. Pt presents now with minor deficits in dynamic standing balance and endurance. Pt requiring use of RW this AM for initial mobility but anticipate good progress with continued OOB activity. Pt able to complete ADLs with Modified Independence to Min A (difficulty with socks d/t BLE edema). Based on current presentation, recommend HHOT at ILF though pt may progress beyond needing OT follow up services. Will continue to follow while admitted.      If plan is discharge home, recommend the following:   A little help with bathing/dressing/bathroom;A little help with walking and/or transfers     Functional Status Assessment   Patient has had a recent decline in their functional status and demonstrates the ability to make significant improvements in function in a reasonable and predictable amount of time.     Equipment Recommendations   Other (comment) (RW; TBD)     Recommendations for Other Services         Precautions/Restrictions   Precautions Precautions: Fall Restrictions Weight Bearing Restrictions Per Provider Order: No     Mobility Bed Mobility Overal bed mobility: Modified Independent             General bed mobility comments: increased time/effort, HOB elevated to 35*    Transfers Overall transfer level: Needs  assistance Equipment used: 1 person hand held assist Transfers: Sit to/from Stand Sit to Stand: Contact guard assist, Min assist           General transfer comment: Light Min A to CGA to stand from bedside with handheld assist. Pt opted to use RW for further mobility      Balance Overall balance assessment: Needs assistance Sitting-balance support: No upper extremity supported, Feet supported Sitting balance-Leahy Scale: Fair     Standing balance support: Bilateral upper extremity supported, During functional activity, No upper extremity supported Standing balance-Leahy Scale: Fair                             ADL either performed or assessed with clinical judgement   ADL Overall ADL's : Needs assistance/impaired Eating/Feeding: Independent   Grooming: Supervision/safety;Standing;Wash/dry face   Upper Body Bathing: Modified independent   Lower Body Bathing: Supervison/ safety   Upper Body Dressing : Modified independent   Lower Body Dressing: Minimal assistance;Contact guard assist;Sitting/lateral leans;Sit to/from stand Lower Body Dressing Details (indicate cue type and reason): Assist to don socks d/t edema in BLE. reports wearing mostly slip on shoes Toilet Transfer: Supervision/safety;Ambulation;Rolling walker (2 wheels)   Toileting- Clothing Manipulation and Hygiene: Supervision/safety;Sitting/lateral lean;Sit to/from stand       Functional mobility during ADLs: Supervision/safety;Rolling walker (2 wheels) General ADL Comments: With first time OOB, pt with some imbalance and requiring use of RW. Anticipate with continued activity, will progress away from RW. Some limitations d/t BLE edema     Vision Ability to See in Adequate Light: 0  Adequate Patient Visual Report: No change from baseline Vision Assessment?: No apparent visual deficits     Perception         Praxis         Pertinent Vitals/Pain Pain Assessment Pain Assessment: No/denies  pain     Extremity/Trunk Assessment Upper Extremity Assessment Upper Extremity Assessment: Overall WFL for tasks assessed;Right hand dominant   Lower Extremity Assessment Lower Extremity Assessment: Defer to PT evaluation   Cervical / Trunk Assessment Cervical / Trunk Assessment: Normal   Communication Communication Communication: No apparent difficulties   Cognition Arousal: Alert Behavior During Therapy: WFL for tasks assessed/performed Cognition: No apparent impairments                               Following commands: Intact       Cueing  General Comments   Cueing Techniques: Verbal cues      Exercises     Shoulder Instructions      Home Living Family/patient expects to be discharged to:: Other (Comment) (ILF)                                 Additional Comments: [facility]. first floor condo, walk in shower with shower chair. Has a cane      Prior Functioning/Environment Prior Level of Function : Independent/Modified Independent             Mobility Comments: intermittent cane use ADLs Comments: Indep with ADLs, goes to dining hall for all meals. Plays chair volleyball    OT Problem List: Decreased activity tolerance;Impaired balance (sitting and/or standing);Increased edema   OT Treatment/Interventions: Self-care/ADL training;Therapeutic exercise;Energy conservation;DME and/or AE instruction;Therapeutic activities;Patient/family education;Balance training      OT Goals(Current goals can be found in the care plan section)   Acute Rehab OT Goals Patient Stated Goal: not need to use RW for mobility OT Goal Formulation: With patient Time For Goal Achievement: 07/22/24 Potential to Achieve Goals: Good ADL Goals Pt Will Transfer to Toilet: with modified independence;ambulating Pt/caregiver will Perform Home Exercise Program: Increased strength;Both right and left upper extremity;With theraband;Independently;With  written HEP provided Additional ADL Goal #1: Pt to complete ADLs/functional mobility > 10 min without seated rest break   OT Frequency:  Min 2X/week    Co-evaluation              AM-PAC OT 6 Clicks Daily Activity     Outcome Measure Help from another person eating meals?: None Help from another person taking care of personal grooming?: A Little Help from another person toileting, which includes using toliet, bedpan, or urinal?: A Little Help from another person bathing (including washing, rinsing, drying)?: A Little Help from another person to put on and taking off regular upper body clothing?: A Little Help from another person to put on and taking off regular lower body clothing?: A Little 6 Click Score: 19   End of Session Equipment Utilized During Treatment: Gait belt;Rolling walker (2 wheels) Nurse Communication: Other (comment) (NT observed pt walking)  Activity Tolerance: Patient tolerated treatment well Patient left: in chair;with call bell/phone within reach;with chair alarm set  OT Visit Diagnosis: Muscle weakness (generalized) (M62.81);Other abnormalities of gait and mobility (R26.89)                Time: 9272-9242 OT Time Calculation (min): 30 min Charges:  OT General Charges $OT  Visit: 1 Visit OT Evaluation $OT Eval Low Complexity: 1 Low OT Treatments $Therapeutic Activity: 8-22 mins  Mliss NOVAK, OTR/L Acute Rehab Services Office: [phone]   Mliss Fish 07/08/2024, 8:11 AM

## 2024-07-08 NOTE — Plan of Care (Signed)
  Problem: Education: Goal: Knowledge of General Education information will improve Description: Including pain rating scale, medication(s)/side effects and non-pharmacologic comfort measures Outcome: Progressing   Problem: Clinical Measurements: Goal: Respiratory complications will improve Outcome: Progressing Goal: Cardiovascular complication will be avoided Outcome: Progressing   Problem: Activity: Goal: Risk for activity intolerance will decrease Outcome: Progressing   Problem: Nutrition: Goal: Adequate nutrition will be maintained Outcome: Progressing   Problem: Coping: Goal: Level of anxiety will decrease Outcome: Progressing   Problem: Elimination: Goal: Will not experience complications related to bowel motility Outcome: Progressing Goal: Will not experience complications related to urinary retention Outcome: Progressing   Problem: Pain Managment: Goal: General experience of comfort will improve and/or be controlled Outcome: Progressing   Problem: Safety: Goal: Ability to remain free from injury will improve Outcome: Progressing   Problem: Skin Integrity: Goal: Risk for impaired skin integrity will decrease Outcome: Progressing

## 2024-07-08 NOTE — Evaluation (Signed)
 Physical Therapy Evaluation Patient Details Name: ENCARNACION SCIONEAUX MRN: [id number] DOB: [birth year] Today's Date: 07/08/2024  History of Present Illness  Pt is a 78 y/o male presenting from ILF with transient confusion. Admitted for  thrombocytopenia, anemia and SIRS. 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 Pt reports ind with intermittent SPC use, reports active at ILF and participates in chair volleyball and walks for exercise. Pt somewhat tangential, unable to state current year or age, but able to follow commands and pleasant. On eval pt completes transfers and amb with RW, CGA for safety, good steadiness but initially hesitant to ambulate, slow cadence with short steps, increased time and steps with turns, declines gait without AD. Pt without complaints throughout eval. Recommend HHPT at ILF at d/c. Pt currently with functional limitations due to the deficits listed below (see PT Problem List). Pt will benefit from acute skilled PT to increase their independence and safety with mobility to allow discharge.           If plan is discharge home, recommend the following: Assistance with cooking/housework;Assist for transportation   Can travel by private vehicle        Equipment Recommendations Rolling walker (2 wheels)  Recommendations for Other Services       Functional Status Assessment Patient has had a recent decline in their functional status and demonstrates the ability to make significant improvements in function in a reasonable and predictable amount of time.     Precautions / Restrictions Precautions Precautions: Fall Restrictions Weight Bearing Restrictions Per Provider Order: No      Mobility  Bed Mobility               General bed mobility comments: in  recliner upon arrival    Transfers Overall transfer level: Needs assistance Equipment used: Rolling walker (2 wheels) Transfers: Sit to/from Stand Sit to Stand: Contact guard assist           General transfer comment: slow to power up, BUE assisting to rise, cues for hand placement with rising and for controlled lowering    Ambulation/Gait Ambulation/Gait assistance: Contact guard assist Gait Distance (Feet): 100 Feet Assistive device: Rolling walker (2 wheels) Gait Pattern/deviations: Step-through pattern, Decreased stride length, Trunk flexed Gait velocity: decreased     General Gait Details: step through gait pattern, trunk slightly forward flexed, decreased cadence, increased time and steps with turns, declines to attempt gait without AD  Stairs            Wheelchair Mobility     Tilt Bed    Modified Rankin (Stroke Patients Only)       Balance Overall balance assessment: Needs assistance Sitting-balance support: No upper extremity supported, Feet supported Sitting balance-Leahy Scale: Fair     Standing balance support: Bilateral upper extremity supported, During functional activity, Reliant on assistive device for balance Standing balance-Leahy Scale: Poor                               Pertinent Vitals/Pain Pain Assessment Pain Assessment: No/denies pain    Home Living Family/patient expects to be discharged to:: Other (Comment) (ILF)                   Additional Comments: [facility] ILF. first floor condo, walk  in shower with shower chair. Has a cane    Prior Function Prior Level of Function : Independent/Modified Independent             Mobility Comments: intermittent cane use, denies falls ADLs Comments: Indep with ADLs, goes to dining hall for all meals. Plays chair volleyball     Extremity/Trunk Assessment   Upper Extremity Assessment Upper Extremity Assessment: Defer to OT evaluation    Lower Extremity  Assessment Lower Extremity Assessment: Overall WFL for tasks assessed (AROM WFL, strength grossly 3+/4, denies numbness/tingling throughout)    Cervical / Trunk Assessment Cervical / Trunk Assessment: Normal  Communication   Communication Communication: No apparent difficulties    Cognition Arousal: Alert Behavior During Therapy: WFL for tasks assessed/performed   PT - Cognitive impairments: No family/caregiver present to determine baseline                       PT - Cognition Comments: pt able to state name, DOB, current month, location correctly; pt unable to name ILF, current year, or current age; pt easily distracted and appears to lose train of thought going from one topic to the next Following commands: Intact       Cueing       General Comments      Exercises     Assessment/Plan    PT Assessment Patient needs continued PT services  PT Problem List Decreased activity tolerance;Decreased balance;Decreased strength;Decreased knowledge of use of DME       PT Treatment Interventions DME instruction;Gait training;Functional mobility training;Therapeutic activities;Therapeutic exercise;Balance training;Patient/family education    PT Goals (Current goals can be found in the Care Plan section)  Acute Rehab PT Goals Patient Stated Goal: return to chair volleyball PT Goal Formulation: With patient Time For Goal Achievement: 07/22/24 Potential to Achieve Goals: Good    Frequency Min 3X/week     Co-evaluation               AM-PAC PT 6 Clicks Mobility  Outcome Measure Help needed turning from your back to your side while in a flat bed without using bedrails?: None Help needed moving from lying on your back to sitting on the side of a flat bed without using bedrails?: None Help needed moving to and from a bed to a chair (including a wheelchair)?: A Little Help needed standing up from a chair using your arms (e.g., wheelchair or bedside chair)?: A  Little Help needed to walk in hospital room?: A Little Help needed climbing 3-5 steps with a railing? : A Little 6 Click Score: 20    End of Session Equipment Utilized During Treatment: Gait belt Activity Tolerance: Patient tolerated treatment well Patient left: in chair;with call bell/phone within reach;with chair alarm set Nurse Communication: Mobility status PT Visit Diagnosis: Muscle weakness (generalized) (M62.81);Other abnormalities of gait and mobility (R26.89)    Time: 1320-1340 PT Time Calculation (min) (ACUTE ONLY): 20 min   Charges:   PT Evaluation $PT Eval Low Complexity: 1 Low   PT General Charges $$ ACUTE PT VISIT: 1 Visit         Tori Micheala Morissette PT, DPT 07/08/24, 1:57 PM

## 2024-07-08 NOTE — Progress Notes (Signed)
 PROGRESS NOTE    Aaron Gonzalez  FMW:[id number] DOB: Nov 16, 1945 DOA: 07/05/2024 PCP: [name], MD   Brief Narrative:  Aaron Gonzalez is a 78 y.o. 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  Hospitalization has been complicated by fever of unclear etiology. Has been placed on IV Vanc/Cefepime . Fevers have improved but continues to be thrombocytopenic.    Assessment and Plan:  SIRS: Patient has no leukocytosis but has been febrile, tachycardic and tachypneic.  WBC is now 4.4.  COVID, RSV, influenza A and B are negative.  CT of the chest abdomen pelvis showed no acute intrathoracic abnormality but there was Limited evaluation of the pelvis due to streak artifact originating from bilateral femoral surgical hardware.  UA was relatively unremarkable and blood cultures x 2 showing NGTD @ 3 Days.  Urine culture Showing No Growth.  Chest x-ray done and showed no active disease.  Given his immunocompromised state will continue empiric IV antibiotics with IV cefepime  and IV vancomycin .  Continue monitor for further signs and symptoms of infection given his recent chemotherapy.  Continues to spike temperatures intermittently but is improving and T Max is 99.6 in the last 24 hours  Chemotherapy associated Pancytopenia: WBC, Hgb/Hct, and Plt Count Trend: Recent Labs  Lab 06/24/24 0844 06/25/24 1004 07/01/24 1454 07/05/24 1940 07/06/24 0620 07/07/24 0559 07/08/24 0953  WBC 302.2* 270.9* 132.3* 4.7 4.4 3.8* 3.0*   Recent Labs  Lab 06/24/24 0844 06/25/24 1004 07/01/24 1454 07/05/24 1940 07/06/24 0620 07/07/24 0559 07/08/24 0953  HGB 11.3* 11.3* 10.9* 6.9* 9.9* 10.3* 9.8*  HCT 34.6* 33.4* 33.1* 21.8* 29.6* 31.6* 29.8*  Plt Count Trend:  Recent Labs  Lab  06/24/24 0844 06/25/24 1004 07/01/24 1454 07/05/24 1940 07/06/24 0620 07/07/24 0559 07/08/24 0953  PLT 83* 43* 92* 16* 20* 14* 16*  -Hemoglobin 6.9 and platelets 16 compared to previous Hgb 10.9 and PLT 92 on 07/01/2024.  Patient denies any obvious bleeding. -Transfuse 1 unit PRBC on 9/16 -Transfuse 1 unit of platelets on 9/16, 9/17, 9/18 -Avoiding all anticoagulation - CTM for S/Sx of Bleeding; No overt bleeding noted.  Per Oncology keep hemoglobin above 8 and platelet counts above 20,000.   Transient Confusion: In the setting of Above. Patient reports transient episode of confusion prior to arrival.  He feels close to his baseline now.  Given new thrombocytopenia will obtain noncontrast head CT which showed No acute intracranial abnormality but did show  Age-related atrophy and mild periventricular white matter disease and Mineralization in the right thalamus related to remote infarct.  Hypomagnesemia: Mag Level is now 2.0. CTM and Replete as Necessary. Repeat CMP in the AM   Hypophosphatemia: Mild. Phos Level is now 2.0. Replete w/ po  K Phos  500 mg po BID x2. CTM and Replete as Necessary. Repeat Phos Level in the AM   Metabolic Acidosis: Mild. Has a CO2 of 21, AG of 11, Chloride Level of 110. CTM and Trend and repeat CMP in the AM   Chronic Lymphocytic Leukemia: Primary oncologist is Dr. Sherrod.  Patient recently found to have recurrence and started on treatment with Gazyva , last infusion 9/12 with plan to start venetoclax  on 9/25.  WBC is now 3.0  from 132.3 on 9/11. Abx w/ IV Cefepime  an IV Vancomycin  as above   Essential Hypertension: Blood  pressure stable.  Was Holding amlodipine  and Coreg  pending head CT but now have resumed. CTM BP per Protocol. Last BP reading was 128/78   History of Hemorrhagic CVA: Appears to have some residual word finding difficulty. 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  Hyperbilirubinemia: T Bili Trend Improving:  Recent Labs  Lab 06/10/24 1019 06/24/24 0844 06/25/24 1004 07/01/24 1454 07/05/24 1940 07/07/24 0559 07/08/24 0953  BILITOT 1.2 2.6* 5.2* 1.2 2.3* 3.3* 2.4*  -CTM and Trend and repeat CMP in the AM  Hypoalbuminemia: Patient's Albumin Trend: Recent Labs  Lab 06/10/24 1019 06/24/24 0844 06/25/24 1004 07/01/24 1454 07/05/24 1940 07/07/24 0559 07/08/24 0953  ALBUMIN 4.3 4.2 4.4 4.1 3.4* 3.0* 3.0*  -Continue to Monitor and Trend and repeat CMP in the AM  DVT prophylaxis: SCDs Start: 07/06/24 0024    Code Status: Full Code Family Communication: Discussed with family currently at bedside  Disposition Plan:  Level of care: Telemetry Status is: Inpatient Remains inpatient appropriate because: Improving and Anticipating D/C in the next 24-48 hours if afebrile   Consultants:  Medical Oncology  Procedures:  As delineated as above  Antimicrobials:  Anti-infectives (From admission, onward)    Start     Dose/Rate Route Frequency Ordered Stop   07/07/24 2000  vancomycin  (VANCOREADY) IVPB 1500 mg/300 mL        1,500 mg 150 mL/hr over 120 Minutes Intravenous Every 24 hours 07/07/24 0723     07/06/24 2000  vancomycin  (VANCOREADY) IVPB 1250 mg/250 mL  Status:  Discontinued        1,250 mg 166.7 mL/hr over 90 Minutes Intravenous Every 24 hours 07/06/24 1520 07/07/24 0723   07/06/24 1600  ceFEPIme  (MAXIPIME ) 2 g in sodium chloride  0.9 % 100 mL IVPB        2 g 200 mL/hr over 30 Minutes Intravenous Every 8 hours 07/06/24 1513     07/06/24 1545  ceFEPIme  (MAXIPIME ) 2 g in sodium chloride  0.9 % 100 mL IVPB  Status:  Discontinued        2 g 200 mL/hr over 30 Minutes Intravenous Every 8 hours 07/06/24 1451 07/06/24 1513   07/06/24 1000  acyclovir  (ZOVIRAX ) tablet 400 mg        400 mg Oral Daily 07/06/24 0035     07/05/24 1915  ceFEPIme  (MAXIPIME ) 2 g in sodium chloride  0.9 % 100 mL IVPB        2 g 200 mL/hr over  30 Minutes Intravenous  Once 07/05/24 1902 07/05/24 2007   07/05/24 1915  metroNIDAZOLE  (FLAGYL ) IVPB 500 mg        500 mg 100 mL/hr over 60 Minutes Intravenous  Once 07/05/24 1902 07/05/24 2053   07/05/24 1915  vancomycin  (VANCOCIN ) IVPB 1000 mg/200 mL premix  Status:  Discontinued        1,000 mg 200 mL/hr over 60 Minutes Intravenous  Once 07/05/24 1902 07/05/24 1903   07/05/24 1915  vancomycin  (VANCOREADY) IVPB 1500 mg/300 mL        1,500 mg 150 mL/hr over 120 Minutes Intravenous  Once 07/05/24 1903 07/05/24 2239       Subjective: Seen and examined at bedside and is in the chair feeling better.  Going to work with therapy later today.  No nausea or vomiting.  No chest pain, shortness breath burning or discomfort in his urine.  States that he had a bowel movement.  No other concerns or complaints at this time.  Platelet count is low again so agreeable for another platelet unit transfusion.  Objective: Vitals:   07/08/24 0503 07/08/24 1221 07/08/24 1253 07/08/24 1514  BP: 133/89 128/76 124/74 128/78  Pulse: 88 76 75 82  Resp: 14 20 16 20   Temp: 98.8 F (37.1 C) 98.3 F (36.8 C) 98.3 F (36.8 C) 98.1 F (36.7 C)  TempSrc: Oral Oral Oral Oral  SpO2: 96% 95% 95% 94%  Weight:      Height:        Intake/Output Summary (Last 24 hours) at 07/08/2024 1649 Last data filed at 07/08/2024 1530 Gross per 24 hour  Intake 2369.98 ml  Output 400 ml  Net 1969.98 ml   Filed Weights   07/06/24 1621  Weight: 70.5 kg   Examination: Physical Exam:  Constitutional: WN/WD overweight African-American male in no acute distress Respiratory: Diminished to auscultation bilaterally, no wheezing, rales, rhonchi or crackles. Normal respiratory effort and patient is not tachypenic. No accessory muscle use.  Unlabored breathing Cardiovascular: RRR, no murmurs / rubs / gallops. S1 and S2 auscultated.  Has lower 2+ pitting edema Abdomen: Soft, non-tender, Distended 2/2 to body habitus. Bowel sounds  positive.  GU: Deferred. Musculoskeletal: No clubbing / cyanosis of digits/nails. No joint deformity upper and lower extremities.  Skin: No rashes, lesions, ulcers on a limited skin evaluation. No induration; Warm and dry.  Neurologic: CN 2-12 grossly intact with no focal deficits. Romberg sign and cerebellar reflexes not assessed.  Psychiatric: Normal judgment and insight. Alert and oriented x 3. Normal mood and appropriate affect.   Data Reviewed: I have personally reviewed following labs and imaging studies  CBC: Recent Labs  Lab 07/05/24 1940 07/06/24 0620 07/07/24 0559 07/08/24 0953  WBC 4.7 4.4 3.8* 3.0*  NEUTROABS 3.0  --  2.3 1.5*  HGB 6.9* 9.9* 10.3* 9.8*  HCT 21.8* 29.6* 31.6* 29.8*  MCV 98.6 95.5 94.6 95.5  PLT 16* 20* 14* 16*   Basic Metabolic Panel: Recent Labs  Lab 07/05/24 1940 07/06/24 0620 07/07/24 0559 07/08/24 0953  NA 138 141 141 142  K 3.9 3.8 3.8 4.3  CL 107 110 109 110  CO2 19* 19* 19* 21*  GLUCOSE 124* 98 88 107*  BUN 18 14 18 16   CREATININE 0.87 0.71 0.76 0.68  CALCIUM  8.5* 7.8* 7.8* 7.9*  MG  --   --  1.6* 2.0  PHOS  --   --  2.6 2.0*   GFR: Estimated Creatinine Clearance: 66.2 mL/min (by C-G formula based on SCr of 0.68 mg/dL). Liver Function Tests: Recent Labs  Lab 07/05/24 1940 07/07/24 0559 07/08/24 0953  AST 67* 66* 52*  ALT 24 23 20   ALKPHOS 62 55 49  BILITOT 2.3* 3.3* 2.4*  PROT 5.2* 4.8* 4.9*  ALBUMIN 3.4* 3.0* 3.0*   No results for input(s): LIPASE, AMYLASE in the last 168 hours. No results for input(s): AMMONIA in the last 168 hours. Coagulation Profile: Recent Labs  Lab 07/05/24 1940  INR 1.3*   Cardiac Enzymes: No results for input(s): CKTOTAL, CKMB, CKMBINDEX, TROPONINI in the last 168 hours. BNP (last 3 results) No results for input(s): PROBNP in the last 8760 hours. HbA1C: No results for input(s): HGBA1C in the last 72 hours. CBG: No results for input(s): GLUCAP in the last 168  hours. Lipid Profile: No results for input(s): CHOL, HDL, LDLCALC, TRIG, CHOLHDL, LDLDIRECT in the last 72 hours. Thyroid Function Tests: No results for input(s): TSH, T4TOTAL,  FREET4, T3FREE, THYROIDAB in the last 72 hours. Anemia Panel: No results for input(s): VITAMINB12, FOLATE, FERRITIN, TIBC, IRON, RETICCTPCT in the last 72 hours. Sepsis Labs: Recent Labs  Lab 07/05/24 1947 07/05/24 2133  LATICACIDVEN 1.4 1.2   Recent Results (from the past 240 hours)  Blood Culture (routine x 2)     Status: None (Preliminary result)   Collection Time: 07/05/24  7:02 PM   Specimen: BLOOD  Result Value Ref Range Status   Specimen Description   Final    BLOOD RIGHT ANTECUBITAL Performed at [facility], [address] [place], [place] [address]    Special Requests   Final    Blood Culture results may not be optimal due to an inadequate volume of blood received in culture bottles BOTTLES DRAWN AEROBIC AND ANAEROBIC Performed at [facility], [address], [place], [place] [address]    Culture   Final    NO GROWTH 3 DAYS Performed at [facility], [address] [place], [place] [address]    Report Status PENDING  Incomplete  Resp panel by RT-PCR (RSV, Flu A&B, Covid) Anterior Nasal Swab     Status: None   Collection Time: 07/05/24  7:40 PM   Specimen: Anterior Nasal Swab  Result Value Ref Range Status   SARS Coronavirus 2 by RT PCR NEGATIVE NEGATIVE Final    Comment: (NOTE) SARS-CoV-2 target nucleic acids are NOT DETECTED.  The SARS-CoV-2 RNA is generally detectable in upper respiratory specimens during the acute phase of infection. The lowest concentration of SARS-CoV-2 viral copies this assay can detect is 138 copies/mL. A negative result does not preclude SARS-Cov-2 infection and should not be used as the sole basis for treatment or other patient management decisions. A negative result may occur with   improper specimen collection/handling, submission of specimen other than nasopharyngeal swab, presence of viral mutation(s) within the areas targeted by this assay, and inadequate number of viral copies(<138 copies/mL). A negative result must be combined with clinical observations, patient history, and epidemiological information. The expected result is Negative.  Fact Sheet for Patients:  BloggerCourse.com  Fact Sheet for Healthcare Providers:  SeriousBroker.it  This test is no t yet approved or cleared by the [place]  FDA and  has been authorized for detection and/or diagnosis of SARS-CoV-2 by FDA under an Emergency Use Authorization (EUA). This EUA will remain  in effect (meaning this test can be used) for the duration of the COVID-19 declaration under Section 564(b)(1) of the Act, 21 U.S.C.section 360bbb-3(b)(1), unless the authorization is terminated  or revoked sooner.       Influenza A by PCR NEGATIVE NEGATIVE Final   Influenza B by PCR NEGATIVE NEGATIVE Final    Comment: (NOTE) The Xpert Xpress SARS-CoV-2/FLU/RSV plus assay is intended as an aid in the diagnosis of influenza from Nasopharyngeal swab specimens and should not be used as a sole basis for treatment. Nasal washings and aspirates are unacceptable for Xpert Xpress SARS-CoV-2/FLU/RSV testing.  Fact Sheet for Patients: BloggerCourse.com  Fact Sheet for Healthcare Providers: SeriousBroker.it  This test is not yet approved or cleared by the [place]  FDA and has been authorized for detection and/or diagnosis of SARS-CoV-2 by FDA under an Emergency Use Authorization (EUA). This EUA will remain in effect (meaning this test can be used) for the duration of the COVID-19 declaration under Section 564(b)(1) of the Act, 21 U.S.C. section 360bbb-3(b)(1), unless the authorization is terminated or revoked.  Resp Syncytial Virus by PCR NEGATIVE NEGATIVE Final    Comment: (NOTE) Fact Sheet for Patients: BloggerCourse.com  Fact Sheet for Healthcare Providers: SeriousBroker.it  This test is not yet approved or cleared by the [place]  FDA and has been authorized for detection and/or diagnosis of SARS-CoV-2 by FDA under an Emergency Use Authorization (EUA). This EUA will remain in effect (meaning this test can be used) for the duration of the COVID-19 declaration under Section 564(b)(1) of the Act, 21 U.S.C. section 360bbb-3(b)(1), unless the authorization is terminated or revoked.  Performed at [facility], [address] [place], [place] [address]   Blood Culture (routine x 2)     Status: None (Preliminary result)   Collection Time: 07/05/24  7:40 PM   Specimen: BLOOD LEFT ARM  Result Value Ref Range Status   Specimen Description BLOOD LEFT ARM  Final   Special Requests   Final    BOTTLES DRAWN AEROBIC AND ANAEROBIC Blood Culture results may not be optimal due to an inadequate volume of blood received in culture bottles   Culture   Final    NO GROWTH 3 DAYS Performed at [facility], [address] [place], [place] [address]    Report Status PENDING  Incomplete  Urine Culture (for pregnant, neutropenic or urologic patients or patients with an indwelling urinary catheter)     Status: None   Collection Time: 07/06/24  5:26 PM   Specimen: Urine, Clean Catch  Result Value Ref Range Status   Specimen Description   Final    URINE, CLEAN CATCH Performed at [facility], [address] [place], [place] [address]    Special Requests   Final    NONE Performed at [facility], [address] [place], [place] [address]    Culture   Final    NO GROWTH Performed at [facility], [address] [place], [place] [address]    Report Status 07/07/2024 FINAL  Final     Radiology Studies: No results found.  Scheduled Meds:  acyclovir   400 mg Oral Daily   allopurinol   300 mg Oral Daily   amLODipine   10 mg Oral Daily   carvedilol   6.25 mg Oral BID WC   phosphorus  500 mg Oral BID   sodium chloride  flush  3 mL Intravenous Q12H   Continuous Infusions:  sodium chloride  75 mL/hr at 07/08/24 0321   ceFEPime  (MAXIPIME ) IV 2 g (07/08/24 1612)   vancomycin  Stopped (07/07/24 2227)    LOS: 1 day   Alejandro Marker, DO [facility] Available via Epic secure chat 7am-7pm After these hours, please refer to coverage provider listed on [email or web address] 07/08/2024, 4:49 PM

## 2024-07-09 ENCOUNTER — Inpatient Hospital Stay

## 2024-07-09 ENCOUNTER — Other Ambulatory Visit (HOSPITAL_COMMUNITY): Payer: Self-pay

## 2024-07-09 ENCOUNTER — Encounter: Payer: Self-pay | Admitting: Internal Medicine

## 2024-07-09 ENCOUNTER — Other Ambulatory Visit: Payer: Self-pay

## 2024-07-09 DIAGNOSIS — C9110 Chronic lymphocytic leukemia of B-cell type not having achieved remission: Secondary | ICD-10-CM | POA: Diagnosis not present

## 2024-07-09 DIAGNOSIS — D6481 Anemia due to antineoplastic chemotherapy: Secondary | ICD-10-CM | POA: Diagnosis not present

## 2024-07-09 DIAGNOSIS — I10 Essential (primary) hypertension: Secondary | ICD-10-CM | POA: Diagnosis not present

## 2024-07-09 DIAGNOSIS — D6959 Other secondary thrombocytopenia: Secondary | ICD-10-CM | POA: Diagnosis not present

## 2024-07-09 LAB — CBC WITH DIFFERENTIAL/PLATELET
Abs Immature Granulocytes: 0.02 K/uL (ref 0.00–0.07)
Basophils Absolute: 0.1 K/uL (ref 0.0–0.1)
Basophils Relative: 2 %
Eosinophils Absolute: 0.3 K/uL (ref 0.0–0.5)
Eosinophils Relative: 11 %
HCT: 29.6 % — ABNORMAL LOW (ref 39.0–52.0)
Hemoglobin: 9.5 g/dL — ABNORMAL LOW (ref 13.0–17.0)
Immature Granulocytes: 1 %
Lymphocytes Relative: 28 %
Lymphs Abs: 0.8 K/uL (ref 0.7–4.0)
MCH: 30.8 pg (ref 26.0–34.0)
MCHC: 32.1 g/dL (ref 30.0–36.0)
MCV: 96.1 fL (ref 80.0–100.0)
Monocytes Absolute: 0.3 K/uL (ref 0.1–1.0)
Monocytes Relative: 10 %
Neutro Abs: 1.4 K/uL — ABNORMAL LOW (ref 1.7–7.7)
Neutrophils Relative %: 48 %
Platelets: 22 K/uL — CL (ref 150–400)
RBC: 3.08 MIL/uL — ABNORMAL LOW (ref 4.22–5.81)
RDW: 17.2 % — ABNORMAL HIGH (ref 11.5–15.5)
Smear Review: NORMAL
WBC: 2.9 K/uL — ABNORMAL LOW (ref 4.0–10.5)
nRBC: 0.7 % — ABNORMAL HIGH (ref 0.0–0.2)

## 2024-07-09 LAB — PREPARE PLATELET PHERESIS: Unit division: 0

## 2024-07-09 LAB — BPAM PLATELET PHERESIS
Blood Product Expiration Date: 202509192359
ISSUE DATE / TIME: 202509181228
Unit Type and Rh: 6200

## 2024-07-09 LAB — COMPREHENSIVE METABOLIC PANEL WITH GFR
ALT: 17 U/L (ref 0–44)
AST: 43 U/L — ABNORMAL HIGH (ref 15–41)
Albumin: 2.9 g/dL — ABNORMAL LOW (ref 3.5–5.0)
Alkaline Phosphatase: 49 U/L (ref 38–126)
Anion gap: 12 (ref 5–15)
BUN: 17 mg/dL (ref 8–23)
CO2: 19 mmol/L — ABNORMAL LOW (ref 22–32)
Calcium: 7.9 mg/dL — ABNORMAL LOW (ref 8.9–10.3)
Chloride: 112 mmol/L — ABNORMAL HIGH (ref 98–111)
Creatinine, Ser: 0.63 mg/dL (ref 0.61–1.24)
GFR, Estimated: >60 mL/min (ref >60)
Glucose, Bld: 88 mg/dL (ref 70–99)
Potassium: 3.6 mmol/L (ref 3.5–5.1)
Sodium: 143 mmol/L (ref 135–145)
Total Bilirubin: 1.7 mg/dL — ABNORMAL HIGH (ref 0.0–1.2)
Total Protein: 4.7 g/dL — ABNORMAL LOW (ref 6.5–8.1)

## 2024-07-09 LAB — PHOSPHORUS: Phosphorus: 2.8 mg/dL (ref 2.5–4.6)

## 2024-07-09 LAB — MAGNESIUM: Magnesium: 1.8 mg/dL (ref 1.7–2.4)

## 2024-07-09 MED ORDER — MAGNESIUM SULFATE 2 GM/50ML IV SOLN
2.0000 g | Freq: Once | INTRAVENOUS | Status: AC
  Administered 2024-07-09: 2 g via INTRAVENOUS
  Filled 2024-07-09: qty 50

## 2024-07-09 MED ORDER — AMOXICILLIN-POT CLAVULANATE 875-125 MG PO TABS
1.0000 | ORAL_TABLET | Freq: Two times a day (BID) | ORAL | 0 refills | Status: AC
  Filled 2024-07-09: qty 6, 3d supply, fill #0

## 2024-07-09 MED ORDER — SODIUM CHLORIDE 0.9% IV SOLUTION: Freq: Once | INTRAVENOUS | Status: AC

## 2024-07-09 MED ORDER — SENNOSIDES-DOCUSATE SODIUM 8.6-50 MG PO TABS
1.0000 | ORAL_TABLET | Freq: Every evening | ORAL | 0 refills | Status: AC | PRN
  Filled 2024-07-09: qty 30, 30d supply, fill #0

## 2024-07-09 MED ORDER — DOXYCYCLINE HYCLATE 100 MG PO TABS
100.0000 mg | ORAL_TABLET | Freq: Two times a day (BID) | ORAL | 0 refills | Status: AC
  Filled 2024-07-09: qty 6, 3d supply, fill #0

## 2024-07-09 MED ORDER — ACETAMINOPHEN 325 MG PO TABS
650.0000 mg | ORAL_TABLET | Freq: Four times a day (QID) | ORAL | 0 refills | Status: AC | PRN
  Filled 2024-07-09: qty 20, 3d supply, fill #0

## 2024-07-09 MED ORDER — AMOXICILLIN-POT CLAVULANATE 875-125 MG PO TABS
1.0000 | ORAL_TABLET | Freq: Two times a day (BID) | ORAL | Status: DC
  Administered 2024-07-09: 1 via ORAL
  Filled 2024-07-09: qty 1

## 2024-07-09 MED ORDER — DOXYCYCLINE HYCLATE 100 MG PO TABS
100.0000 mg | ORAL_TABLET | Freq: Two times a day (BID) | ORAL | Status: DC
Start: 2024-07-09 — End: 2024-07-09
  Administered 2024-07-09: 100 mg via ORAL
  Filled 2024-07-09: qty 1

## 2024-07-09 NOTE — Progress Notes (Signed)
 Discharge meds in a secure bag delivered to pt in room by this RN- PT recommended a rolling walker for pt- per pt he does not have one at home. Request sent to [facility].

## 2024-07-09 NOTE — Progress Notes (Signed)
 Physical Therapy Treatment Patient Details Name: Aaron Gonzalez MRN: [id number] DOB: [birth year] Today's Date: 07/09/2024   History of Present Illness Pt is a 78 y/o male presenting from ILF with transient confusion. Admitted for  thrombocytopenia, anemia and SIRS. 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 Pt attempts gait with SPC, but hesitant and undecided on which hand to hold it in, then tells therapist he has been wanting to switch to RW for good so provided RW. Pt with step through gait pattern, trunk slightly forward flexed, conversational throughout ambulation, without complaints. Pt in agreement with RW recommendation at d/c.   If plan is discharge home, recommend the following: Assistance with cooking/housework;Assist for transportation   Can travel by private vehicle        Equipment Recommendations  Rolling walker (2 wheels)    Recommendations for Other Services       Precautions / Restrictions Precautions Precautions: Fall Restrictions Weight Bearing Restrictions Per Provider Order: No     Mobility  Bed Mobility               General bed mobility comments: in recliner upon arrival    Transfers Overall transfer level: Needs assistance Equipment used: Straight cane Transfers: Sit to/from Stand Sit to Stand: Contact guard assist           General transfer comment: slow to power up, BLE braced against front of recliner, using SPC per pt request but pt holding SPC off of the floor switching it between hands    Ambulation/Gait Ambulation/Gait assistance: Contact guard assist, Supervision Gait Distance (Feet): 200 Feet Assistive device: Rolling walker (2 wheels) Gait Pattern/deviations: Step-through pattern, Decreased stride  length, Trunk flexed Gait velocity: decreased     General Gait Details: initial steps with SPC and pt hesitant and unsteady, requests RW and therapist in agreement, pt able to demonstrate steady gait with RW, supv with gait, conversational without complaints   Stairs             Wheelchair Mobility     Tilt Bed    Modified Rankin (Stroke Patients Only)       Balance Overall balance assessment: Needs assistance Sitting-balance support: No upper extremity supported, Feet supported Sitting balance-Leahy Scale: Fair     Standing balance support: Bilateral upper extremity supported, During functional activity, Reliant on assistive device for balance Standing balance-Leahy Scale: Poor                              Communication Communication Communication: No apparent difficulties  Cognition Arousal: Alert Behavior During Therapy: WFL for tasks assessed/performed   PT - Cognitive impairments: No family/caregiver present to determine baseline                         Following commands: Intact      Cueing Cueing Techniques: Verbal cues  Exercises      General Comments        Pertinent Vitals/Pain Pain Assessment Pain Assessment: No/denies pain    Home Living                          Prior Function  PT Goals (current goals can now be found in the care plan section) Acute Rehab PT Goals Patient Stated Goal: return to chair volleyball PT Goal Formulation: With patient Time For Goal Achievement: 07/22/24 Potential to Achieve Goals: Good Progress towards PT goals: Progressing toward goals    Frequency    Min 3X/week      PT Plan      Co-evaluation              AM-PAC PT 6 Clicks Mobility   Outcome Measure  Help needed turning from your back to your side while in a flat bed without using bedrails?: None Help needed moving from lying on your back to sitting on the side of a flat bed without  using bedrails?: None Help needed moving to and from a bed to a chair (including a wheelchair)?: A Little Help needed standing up from a chair using your arms (e.g., wheelchair or bedside chair)?: A Little Help needed to walk in hospital room?: A Little Help needed climbing 3-5 steps with a railing? : A Little 6 Click Score: 20    End of Session Equipment Utilized During Treatment: Gait belt Activity Tolerance: Patient tolerated treatment well Patient left: in chair;with call bell/phone within reach;with chair alarm set Nurse Communication: Mobility status PT Visit Diagnosis: Muscle weakness (generalized) (M62.81);Other abnormalities of gait and mobility (R26.89)     Time: 8795-8781 PT Time Calculation (min) (ACUTE ONLY): 14 min  Charges:    $Gait Training: 8-22 mins PT General Charges $$ ACUTE PT VISIT: 1 Visit                     Tori Loann Chahal PT, DPT 07/09/24, 1:30 PM

## 2024-07-09 NOTE — Plan of Care (Signed)

## 2024-07-09 NOTE — Progress Notes (Signed)
 AVS reviewed with patient who verbalized an understanding. No other questions at this time- walker delivered to room for home use. Clothes are wet at this time.

## 2024-07-09 NOTE — TOC Transition Note (Addendum)
 Transition of Care [facility]) - Discharge Note   Patient Details  Name: Aaron Gonzalez MRN: [id number] Date of Birth: Sep 24, 1946  [facility] CM/SW Contact:  Toy LITTIE Agar, RN Phone Number:[phone]  07/09/2024, 3:57 PM   Clinical Narrative:    Discharge nurse made CM aware of DME needs. CM at bedside to offer choice for DME and HH. Patient has o preferences. DME rolling walker has been ordered per [employer]. To be delivered to bedside. HH has been set up with [facility]. No other TOC needs noted at this time.          Patient Goals and CMS Choice            Discharge Placement                       Discharge Plan and Services Additional resources added to the After Visit Summary for                                       Social Drivers of Health (SDOH) Interventions SDOH Screenings   Food Insecurity: No Food Insecurity (07/06/2024)  Housing: Unknown (07/06/2024)  Transportation Needs: No Transportation Needs (07/06/2024)  Utilities: Not At Risk (07/06/2024)  Depression (PHQ2-9): Low Risk  (07/02/2024)  Social Connections: Unknown (07/06/2024)  Tobacco Use: Medium Risk (07/06/2024)     Readmission Risk Interventions     No data to display

## 2024-07-09 NOTE — Progress Notes (Signed)
 Pt's nurse will contact pt's nieces to pick up patient.

## 2024-07-10 LAB — CULTURE, BLOOD (ROUTINE X 2)
Culture: NO GROWTH
Culture: NO GROWTH

## 2024-07-11 NOTE — Discharge Summary (Signed)
 Physician Discharge Summary   Patient: Aaron Gonzalez MRN: [id number] DOB: 07-14-1946  Admit date:     07/05/2024  Discharge date: 07/09/2024  Discharge Physician: Alejandro Lazarus Marker   PCP: [name], MD   Recommendations at discharge:   Follow-up with PCP within 1 to 2 weeks repeat CBC, CMP, mag, Phos within 1 week Follow-up with medical oncology in outpatient setting and continue antibiotics  Discharge Diagnoses: Principal Problem:   Chemotherapy-induced thrombocytopenia Active Problems:   CLL (chronic lymphocytic leukemia) (HCC)   Essential hypertension   Anemia associated with chemotherapy  Resolved Problems:   * No resolved hospital problems. *  Hospital Course: WINFRED IIAMS is a 78 y.o. 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  Hospitalization has been complicated by fever of unclear etiology. Has been placed on IV Vanc/Cefepime . Fevers have improved but continues to be thrombocytopenic.  Case was discussed with Dr. Tina prior to discharge and he recommended transfusing the patient another unit of platelets and transition to p.o. antibiotics.  He was transition to oral doxycycline  and Augmentin  and he is medically stable for discharge at this time given his improvement in his fevers.  He is not bleeding anywhere and will continue further care in the outpatient setting  Assessment and Plan:  SIRS: Patient has no leukocytosis but has been febrile, tachycardic and tachypneic.  WBC is now 4.4.  COVID, RSV, influenza A and B are negative.  CT of the chest abdomen pelvis showed no acute intrathoracic abnormality but there was Limited evaluation of the pelvis due to streak artifact originating from bilateral femoral surgical hardware.  UA was  relatively unremarkable and blood cultures x 2 showing NGTD @ 3 Days.  Urine culture Showing No Growth.  Chest x-ray done and showed no active disease.  Given his immunocompromised state will continue empiric IV antibiotics with IV cefepime  and IV vancomycin  and this was changed to p.o. Doxy and Augmentin .  Continue monitor for further signs and symptoms of infection given his recent chemotherapy.  Continues to spike temperatures intermittently but is improving and T Max was normal in the last 48 hours.  Chemotherapy associated Pancytopenia: WBC, Hgb/Hct, and Plt Count Trend: Recent Labs  Lab 06/25/24 1004 07/01/24 1454 07/05/24 1940 07/06/24 0620 07/07/24 0559 07/08/24 0953 07/09/24 0537  WBC 270.9* 132.3* 4.7 4.4 3.8* 3.0* 2.9*   Recent Labs  Lab 06/25/24 1004 07/01/24 1454 07/05/24 1940 07/06/24 0620 07/07/24 0559 07/08/24 0953 07/09/24 0537  HGB 11.3* 10.9* 6.9* 9.9* 10.3* 9.8* 9.5*  HCT 33.4* 33.1* 21.8* 29.6* 31.6* 29.8* 29.6*  Plt Count Trend:  Recent Labs  Lab 06/25/24 1004 07/01/24 1454 07/05/24 1940 07/06/24 0620 07/07/24 0559 07/08/24 0953 07/09/24 0537  PLT 43* 92* 16* 20* 14* 16* 22*  -Hemoglobin 6.9 and platelets 16 compared to previous Hgb 10.9 and PLT 92 on 07/01/2024.  Patient denies any obvious bleeding. -Transfuse 1 unit PRBC on 9/16 -Transfuse 1 unit of platelets on 9/16, 9/17, 9/18 and I spoke with Dr. Tina who recommends transfusing the patient another 1 unit of platelets prior to discharge -Avoiding all anticoagulation - CTM for S/Sx of Bleeding; No overt bleeding noted.  Per Oncology keep hemoglobin above 8 and platelet counts above 20,000.   Transient Confusion: Resolved.  In the setting of Above. Patient reports transient  episode of confusion prior to arrival.  He feels close to his baseline now.  Given new thrombocytopenia will obtain noncontrast head CT which showed No acute intracranial abnormality but did show  Age-related atrophy and mild  periventricular white matter disease and Mineralization in the right thalamus related to remote infarct.  Hypomagnesemia: Mag Level is now 1.8 and repleted prior to discharge. CTM and Replete as Necessary. Repeat CMP within 1 week  Hypophosphatemia: Mild. Phos Level is now 2.8. Replete w/ po  K Phos  500 mg po BID x2. CTM and Replete as Necessary. Repeat Phos Level in the AM   Metabolic Acidosis: Mild. Has a CO2 of 19, AG of 12, Chloride Level of 112. CTM and Trend and repeat CMP within 1 week   Chronic Lymphocytic Leukemia: Primary oncologist is Dr. Sherrod.  Patient recently found to have recurrence and started on treatment with Gazyva , last infusion 9/12 with plan to start venetoclax  on 9/25.  WBC is now 2.9 from 132.3 on 9/11. Abx w/ IV Cefepime  an IV Vancomycin  as above now being changed to orals   Essential Hypertension: Blood pressure stable.  Was Holding amlodipine  and Coreg  pending head CT but now have resumed. CTM BP per Protocol. Last BP reading was 128/78   History of Hemorrhagic CVA: Appears to have some residual word finding difficulty. 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  Hyperbilirubinemia: T Bili Trend Improving:  Recent Labs  Lab 06/24/24 0844 06/25/24 1004 07/01/24 1454 07/05/24 1940 07/07/24 0559 07/08/24 0953 07/09/24 0537  BILITOT 2.6* 5.2* 1.2 2.3* 3.3* 2.4* 1.7*  -CTM and Trend and repeat CMP within 1 week  Hypoalbuminemia: Patient's Albumin Trend: Recent Labs  Lab 06/24/24 0844 06/25/24 1004 07/01/24 1454 07/05/24 1940 07/07/24 0559 07/08/24 0953 07/09/24 0537  ALBUMIN 4.2 4.4 4.1 3.4* 3.0* 3.0* 2.9*  -Continue to Monitor and Trend and repeat CMP within 1 week  Consultants: Medical oncology Procedures performed: As delineated as above  Disposition: Home health at [facility]  Diet recommendation:  Discharge Diet Orders (From admission, onward)      Start     Ordered   07/09/24 0000  Diet - low sodium heart healthy        07/09/24 1517           Cardiac diet DISCHARGE MEDICATION: Allergies as of 07/09/2024       Reactions   Gazyva  [obinutuzumab ] Shortness Of Breath, Nausea And Vomiting, Other (See Comments)   Pt complained of nausea, had emesis, diaphoretic, facial flushing, headache and back pain. See progress notes 06/24/24 for information. Patient with new reaction on 07/02/2024 with shortness of breath. See progress note from 07/02/2024.        Medication List     STOP taking these medications    cyclobenzaprine  10 MG tablet Commonly known as: FLEXERIL        TAKE these medications    acetaminophen  325 MG tablet Commonly known as: TYLENOL  Take 2 tablets (650 mg total) by mouth every 6 (six) hours as needed for mild pain (pain score 1-3) or fever (or Fever >/= 101).   acyclovir  400 MG tablet Commonly known as: ZOVIRAX  Take 1 tablet (400 mg total) by mouth daily.   allopurinol  300 MG tablet Commonly known as: Zyloprim  Take 1 tablet (300 mg total) by mouth daily.   amLODipine  10 MG tablet Commonly known as: NORVASC  Take 1 tablet by mouth daily.  amoxicillin -clavulanate 875-125 MG tablet Commonly known as: AUGMENTIN  Take 1 tablet by mouth every 12 (twelve) hours for 3 days. Notes to patient: Take with food   blood glucose meter kit and supplies Kit Dispense based on patient and insurance preference. Use up to four times daily as directed.   carvedilol  6.25 MG tablet Commonly known as: COREG  TAKE 1 TABLET(6.25 MG) BY MOUTH TWICE DAILY   doxycycline  100 MG tablet Commonly known as: VIBRA -TABS Take 1 tablet (100 mg total) by mouth every 12 (twelve) hours for 3 days. Notes to patient: Take with food   ondansetron  8 MG tablet Commonly known as: ZOFRAN  Take 1 tablet (8 mg total) by mouth every 8 (eight) hours as needed for nausea or vomiting.   prochlorperazine  10 MG tablet Commonly known as:  COMPAZINE  Take 1 tablet (10 mg total) by mouth every 6 (six) hours as needed for nausea or vomiting.   Stool Softener/Laxative 50-8.6 MG tablet Generic drug: senna-docusate Take 1 tablet by mouth at bedtime as needed for mild constipation.   venetoclax  10 MG tablet Commonly known as: VENCLEXTA  Take 1 tablet (10 mg total) by mouth daily for first week of treatment, then increase to 2 tablets (20 mg total) by mouth daily for second week of treatment.Take with food and water . Start: 07/15/24   venetoclax  100 MG tablet Commonly known as: VENCLEXTA  Take 1 tablet (100 mg total) by mouth daily for fourth week of treatment. Take with food and water . Start 08/05/24   venetoclax  50 MG tablet Commonly known as: VENCLEXTA  Take 1 tablet (50 mg total) by mouth daily for third week of treatment. Take with food and water . Start 07/29/24        Follow-up Information     [facility], [place] Follow up.   Why: Your home health has been set up with [facility]. The office will call you with start of service information. If you have any questions or concerns please call the number listed above. Contact information: [address] [place] [address] [phone]                Discharge Exam: Filed Weights   07/06/24 1621  Weight: 70.5 kg   Vitals:   07/09/24 1511 07/09/24 1730  BP: (!) 142/84 135/89  Pulse: 72 79  Resp: 15 16  Temp: 98.8 F (37.1 C) 99.1 F (37.3 C)  SpO2:     Examination: Physical Exam:  Constitutional: WN/WD overweight chronically ill-appearing AAM in NAD Respiratory: Diminished to auscultation bilaterally, no wheezing, rales, rhonchi or crackles. Normal respiratory effort and patient is not tachypenic. No accessory muscle use.  Unlabored breathing Cardiovascular: RRR, no murmurs / rubs / gallops. S1 and S2 auscultated.  1-2+ lower extremity pitting edema Abdomen: Soft, non-tender, slightly distended secondary body  habitus. Bowel sounds positive.  GU: Deferred. Musculoskeletal: No clubbing / cyanosis of digits/nails. No joint deformity upper and lower extremities.  Skin: No rashes, lesions, ulcers on limited skin evaluation. No induration; Warm and dry.  Neurologic: CN 2-12 grossly intact with no focal deficits. Romberg sign and cerebellar reflexes not assessed.  Psychiatric: Normal judgment and insight. Alert and oriented x 3. Normal mood and appropriate affect.   Condition at discharge: stable  The results of significant diagnostics from this hospitalization (including imaging, microbiology, ancillary and laboratory) are listed below for reference.   Imaging Studies: CT HEAD WO CONTRAST Result Date: 07/06/2024 EXAM: CT HEAD WITHOUT CONTRAST 07/06/2024 06:15:00 AM TECHNIQUE:  CT of the head was performed without the administration of intravenous contrast. Automated exposure control, iterative reconstruction, and/or weight based adjustment of the mA/kV was utilized to reduce the radiation dose to as low as reasonably achievable. COMPARISON: CT of the head dated 06/14/2021. CLINICAL HISTORY: Mental status change, unknown cause. Per pt's son he is confused. Code sepsis. Pt currently receiving chemo. FINDINGS: BRAIN AND VENTRICLES: No acute hemorrhage. No evidence of acute infarct. No hydrocephalus. No extra-axial collection. No mass effect or midline shift. Age-related atrophy and mild periventricular white matter disease. Mineralization present posterolaterally within the right thalamus related to remote infarct. ORBITS: No acute abnormality. SINUSES: No acute abnormality. SOFT TISSUES AND SKULL: No acute soft tissue abnormality. No skull fracture. Calcifications within the carotid siphons. IMPRESSION: 1. No acute intracranial abnormality. 2. Age-related atrophy and mild periventricular white matter disease. 3. 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 Electronically signed by: Evalene Coho  MD 07/06/2024 06:29 AM EDT RP Workstation: [id number]   CT CHEST ABDOMEN PELVIS W CONTRAST Result Date: 07/05/2024 CLINICAL DATA:  Sepsis Febrile. Pt recently underwent surgery for rectal cancer. Currently receiving Chemo. Pt denies any pain EXAM: CT CHEST, ABDOMEN, AND PELVIS WITH CONTRAST TECHNIQUE: Multidetector CT imaging of the chest, abdomen and pelvis was performed following the standard protocol during bolus administration of intravenous contrast. RADIATION DOSE REDUCTION: This exam was performed according to the departmental dose-optimization program which includes automated exposure control, adjustment of the mA and/or kV according to patient size and/or use of iterative reconstruction technique. CONTRAST:  OMNIPAQUE  IOHEXOL  300 MG/ML  SOLN COMPARISON:  CT chest abdomen pelvis 01/06/2024 FINDINGS: CT CHEST FINDINGS Cardiovascular: Normal heart size. No significant pericardial effusion. The thoracic aorta is normal in caliber. Aberrant right subclavian artery. Mild atherosclerotic plaque of the thoracic aorta. Left anterior descending coronary artery calcifications. Mediastinum/Nodes: No enlarged mediastinal, hilar, or axillary lymph nodes. Thyroid gland, trachea, and esophagus demonstrate no significant findings. Tiny hiatal hernia. Lungs/Pleura: No focal consolidation. No pulmonary nodule. No pulmonary mass. No pleural effusion. No pneumothorax. Musculoskeletal: No chest wall abnormality. No suspicious lytic or blastic osseous lesions. No acute displaced fracture. Multilevel degenerative changes of the spine. CT ABDOMEN PELVIS FINDINGS Hepatobiliary: The liver is enlarged measuring up to 23 cm. No focal liver abnormality. No gallstones, gallbladder wall thickening, or pericholecystic fluid. No biliary dilatation. Pancreas: No focal lesion. Normal pancreatic contour. No surrounding inflammatory changes. No main pancreatic ductal dilatation. Spleen: The spleen is enlarged measuring up to 14 cm. No  focal splenic lesion. Adrenals/Urinary Tract: No adrenal nodule bilaterally. Bilateral kidneys enhance symmetrically. Fluid density lesions of the kidneys likely represent simple renal cysts. Simple renal cysts, in the absence of clinically indicated signs/symptoms, require no independent follow-up. No hydronephrosis. No hydroureter. Punctate right nephrolithiasis. No left nephrolithiasis. No ureterolithiasis bilaterally. The urinary bladder is not well visualized due to streak artifact originating from bilateral femoral surgical hardware. Stomach/Bowel: Left lower quadrant end colostomy. Stomach is within normal limits. No evidence of bowel wall thickening or dilatation. Vascular/Lymphatic: No abdominal aorta or iliac aneurysm. Mild atherosclerotic plaque of the aorta and its branches. No abdominal, pelvic, or inguinal lymphadenopathy. Reproductive: Not visualized due to streak artifact originating from bilateral femoral surgical hardware. Other: No intraperitoneal free fluid. No intraperitoneal free gas. No organized fluid collection. Musculoskeletal: No abdominal wall hernia or abnormality. No suspicious lytic or blastic osseous lesions. No acute displaced fracture. Multilevel degenerative changes of the spine. Total bilateral hip arthroplasty. IMPRESSION: 1. No acute intrathoracic abnormality. 2.  Limited evaluation of the pelvis due to streak artifact originating from bilateral femoral surgical hardware. 3. Hepatosplenomegaly. 4. Nonobstructive punctate right nephrolithiasis. 5.  Aortic Atherosclerosis (ICD10-I70.0). 6. Incidentally noted aberrant right subclavian artery. Electronically Signed   By: Morgane  Naveau M.D.   On: 07/05/2024 23:39   DG Chest Port 1 View Result Date: 07/05/2024 CLINICAL DATA:  Questionable sepsis - evaluate for abnormality EXAM: PORTABLE CHEST 1 VIEW COMPARISON:  Chest x-ray 04/01/2021 FINDINGS: The heart and mediastinal contours are unchanged. Atherosclerotic plaque. No focal  consolidation. No pulmonary edema. No pleural effusion. No pneumothorax. No acute osseous abnormality. IMPRESSION: No active disease. Electronically Signed   By: Morgane  Naveau M.D.   On: 07/05/2024 19:38   Microbiology: Results for orders placed or performed during the hospital encounter of 07/05/24  Blood Culture (routine x 2)     Status: None   Collection Time: 07/05/24  7:02 PM   Specimen: BLOOD  Result Value Ref Range Status   Specimen Description   Final    BLOOD RIGHT ANTECUBITAL Performed at [facility], [address] [place], [place] [address]    Special Requests   Final    Blood Culture results may not be optimal due to an inadequate volume of blood received in culture bottles BOTTLES DRAWN AEROBIC AND ANAEROBIC Performed at [facility], [address], [place], [place] [address]    Culture   Final    NO GROWTH 5 DAYS Performed at [facility], [address] [place], [place] [address]    Report Status 07/10/2024 FINAL  Final  Resp panel by RT-PCR (RSV, Flu A&B, Covid) Anterior Nasal Swab     Status: None   Collection Time: 07/05/24  7:40 PM   Specimen: Anterior Nasal Swab  Result Value Ref Range Status   SARS Coronavirus 2 by RT PCR NEGATIVE NEGATIVE Final    Comment: (NOTE) SARS-CoV-2 target nucleic acids are NOT DETECTED.  The SARS-CoV-2 RNA is generally detectable in upper respiratory specimens during the acute phase of infection. The lowest concentration of SARS-CoV-2 viral copies this assay can detect is 138 copies/mL. A negative result does not preclude SARS-Cov-2 infection and should not be used as the sole basis for treatment or other patient management decisions. A negative result may occur with  improper specimen collection/handling, submission of specimen other than nasopharyngeal swab, presence of viral mutation(s) within the areas targeted by this assay, and inadequate number of viral copies(<138  copies/mL). A negative result must be combined with clinical observations, patient history, and epidemiological information. The expected result is Negative.  Fact Sheet for Patients:  BloggerCourse.com  Fact Sheet for Healthcare Providers:  SeriousBroker.it  This test is no t yet approved or cleared by the [place]  FDA and  has been authorized for detection and/or diagnosis of SARS-CoV-2 by FDA under an Emergency Use Authorization (EUA). This EUA will remain  in effect (meaning this test can be used) for the duration of the COVID-19 declaration under Section 564(b)(1) of the Act, 21 U.S.C.section 360bbb-3(b)(1), unless the authorization is terminated  or revoked sooner.       Influenza A by PCR NEGATIVE NEGATIVE Final   Influenza B by PCR NEGATIVE NEGATIVE Final    Comment: (NOTE) The Xpert Xpress SARS-CoV-2/FLU/RSV plus assay is intended as an aid in the diagnosis of influenza from Nasopharyngeal swab specimens and should not be used as a sole basis for treatment. Nasal washings and aspirates are unacceptable for Xpert Xpress  SARS-CoV-2/FLU/RSV testing.  Fact Sheet for Patients: BloggerCourse.com  Fact Sheet for Healthcare Providers: SeriousBroker.it  This test is not yet approved or cleared by the [place]  FDA and has been authorized for detection and/or diagnosis of SARS-CoV-2 by FDA under an Emergency Use Authorization (EUA). This EUA will remain in effect (meaning this test can be used) for the duration of the COVID-19 declaration under Section 564(b)(1) of the Act, 21 U.S.C. section 360bbb-3(b)(1), unless the authorization is terminated or revoked.     Resp Syncytial Virus by PCR NEGATIVE NEGATIVE Final    Comment: (NOTE) Fact Sheet for Patients: BloggerCourse.com  Fact Sheet for Healthcare  Providers: SeriousBroker.it  This test is not yet approved or cleared by the [place]  FDA and has been authorized for detection and/or diagnosis of SARS-CoV-2 by FDA under an Emergency Use Authorization (EUA). This EUA will remain in effect (meaning this test can be used) for the duration of the COVID-19 declaration under Section 564(b)(1) of the Act, 21 U.S.C. section 360bbb-3(b)(1), unless the authorization is terminated or revoked.  Performed at [facility], [address], [place], [place] [address]   Blood Culture (routine x 2)     Status: None   Collection Time: 07/05/24  7:40 PM   Specimen: BLOOD LEFT ARM  Result Value Ref Range Status   Specimen Description BLOOD LEFT ARM  Final   Special Requests   Final    BOTTLES DRAWN AEROBIC AND ANAEROBIC Blood Culture results may not be optimal due to an inadequate volume of blood received in culture bottles   Culture   Final    NO GROWTH 5 DAYS Performed at [facility], [address] [place], [place] [address]    Report Status 07/10/2024 FINAL  Final  Urine Culture (for pregnant, neutropenic or urologic patients or patients with an indwelling urinary catheter)     Status: None   Collection Time: 07/06/24  5:26 PM   Specimen: Urine, Clean Catch  Result Value Ref Range Status   Specimen Description   Final    URINE, CLEAN CATCH Performed at [facility], [address] [place], [place] [address]    Special Requests   Final    NONE Performed at [facility], [address], [place], [place] [address]    Culture   Final    NO GROWTH Performed at [facility], [address], [place], [place] [address]    Report Status 07/07/2024 FINAL  Final   Labs: CBC: Recent Labs  Lab 07/05/24 1940 07/06/24 0620 07/07/24 0559 07/08/24 0953 07/09/24 0537  WBC 4.7 4.4 3.8* 3.0* 2.9*  NEUTROABS 3.0  --  2.3 1.5* 1.4*  HGB 6.9* 9.9*  10.3* 9.8* 9.5*  HCT 21.8* 29.6* 31.6* 29.8* 29.6*  MCV 98.6 95.5 94.6 95.5 96.1  PLT 16* 20* 14* 16* 22*   Basic Metabolic Panel: Recent Labs  Lab 07/05/24 1940 07/06/24 0620 07/07/24 0559 07/08/24 0953 07/09/24 0537  NA 138 141 141 142 143  K 3.9 3.8 3.8 4.3 3.6  CL 107 110 109 110 112*  CO2 19* 19* 19* 21* 19*  GLUCOSE 124* 98 88 107* 88  BUN 18 14 18 16 17   CREATININE 0.87 0.71 0.76 0.68 0.63  CALCIUM  8.5* 7.8* 7.8* 7.9* 7.9*  MG  --   --  1.6* 2.0 1.8  PHOS  --   --  2.6 2.0* 2.8   Liver Function Tests: Recent Labs  Lab 07/05/24 1940 07/07/24 0559 07/08/24 0953 07/09/24 0537  AST 67* 66* 52* 43*  ALT 24 23 20 17   ALKPHOS 62 55 49 49  BILITOT 2.3* 3.3* 2.4* 1.7*  PROT 5.2* 4.8* 4.9* 4.7*  ALBUMIN 3.4* 3.0* 3.0* 2.9*   CBG: No results for input(s): GLUCAP in the last 168 hours.  Discharge time spent: greater than 30 minutes.  Signed: Alejandro Marker, DO [facility] 07/11/2024

## 2024-07-12 ENCOUNTER — Other Ambulatory Visit: Payer: Self-pay

## 2024-07-12 ENCOUNTER — Telehealth: Payer: Self-pay | Admitting: Pharmacist

## 2024-07-12 DIAGNOSIS — C9110 Chronic lymphocytic leukemia of B-cell type not having achieved remission: Secondary | ICD-10-CM

## 2024-07-12 LAB — PREPARE PLATELET PHERESIS: Unit division: 0

## 2024-07-12 LAB — BPAM PLATELET PHERESIS
Blood Product Expiration Date: 202509212359
ISSUE DATE / TIME: 202509191443
Unit Type and Rh: 6200

## 2024-07-12 MED ORDER — VENETOCLAX 10 MG PO TABS
ORAL_TABLET | ORAL | 0 refills | Status: DC
  Filled 2024-07-12: qty 22, 14d supply, fill #0

## 2024-07-12 NOTE — Progress Notes (Signed)
 Oral Chemotherapy Pharmacist Encounter  Patient was counseled under encounter from 06/15/24.  Asberry Macintosh, PharmD, BCPS, BCOP Hematology/Oncology Clinical Pharmacist [facility] and [facility] Oral Chemotherapy Navigation Clinics [phone] 07/12/2024 10:43 AM

## 2024-07-12 NOTE — Telephone Encounter (Addendum)
 Oral Oncology Pharmacist Encounter  Per Dr. Sherrod, OK for patient to proceed as planned with venetoclax  on C1D22 (07/15/24). Pharmacy has set up first shipment of venetoclax  to patient's home, and he knows to start after baseline blood work is complete 07/15/24. No questions or concerns at this time from patient.  For the fist 2 weeks of treatment (10 mg daily x 7 days followed by 20 mg daily x 7 days) pharmacy can only dispense venetoclax  in quantities of #2 or #14. Prescription quantity has been updated to #22, patient will have #1 extra 10 mg tablet after the completion of first 2 weeks of venetoclax .   Asberry Macintosh, PharmD, BCPS, BCOP Hematology/Oncology Clinical Pharmacist [phone] 07/12/2024 10:40 AM

## 2024-07-12 NOTE — Progress Notes (Signed)
 Specialty Pharmacy Initial Fill Coordination Note  GRIFFYN KUCINSKI is a 78 y.o. male contacted today regarding refills of specialty medication(s) Venetoclax  (VENCLEXTA ) .  Patient requested Delivery  on 07/14/24  to verified address [address]   [place] [address]   Medication will be filled on 07/13/24.   Patient is aware of 0.00 copayment.   Lucie Lamer, CPhT [place]  [facility] Pharmacy Technician Patient Advocate Specialist II [place] Phone: [phone]  Fax: [phone] [email or web address]

## 2024-07-12 NOTE — Addendum Note (Signed)
 Addended by: GEOFFRY STABS F on: 07/12/2024 11:05 AM   Modules accepted: Orders

## 2024-07-13 ENCOUNTER — Other Ambulatory Visit: Payer: Self-pay

## 2024-07-13 ENCOUNTER — Encounter: Payer: Self-pay | Admitting: Internal Medicine

## 2024-07-13 ENCOUNTER — Other Ambulatory Visit (HOSPITAL_COMMUNITY): Payer: Self-pay

## 2024-07-13 ENCOUNTER — Telehealth: Payer: Self-pay | Admitting: Medical Oncology

## 2024-07-13 ENCOUNTER — Telehealth: Payer: Self-pay | Admitting: Pharmacist

## 2024-07-13 DIAGNOSIS — C9110 Chronic lymphocytic leukemia of B-cell type not having achieved remission: Secondary | ICD-10-CM

## 2024-07-13 MED ORDER — VENETOCLAX 10 MG PO TABS
ORAL_TABLET | ORAL | 0 refills | Status: DC
  Filled 2024-07-13: qty 28, 17d supply, fill #0

## 2024-07-13 NOTE — Telephone Encounter (Addendum)
 Niece called to get update on plan of care.   Plan reviewed with niece. She  understands Venetoclax  will be mailed to pt to his mailbox or his door and he is not to start it until after his labs done on Thursday 09/25 am.    She understands his labs need to be collected frequently starting Thursday bid and Friday x 1.   On 10/2 his labs will be collected bid and see Dr. Sherrod and receive his IV Obituzumab.  He will get a print out of his schedule.  I contacted Kayla at  [facility] She said pt was seen yesterday by PT and OT will see him today.  Referral placed for nursing for medication management , assessment for side effects and complianc

## 2024-07-13 NOTE — Telephone Encounter (Signed)
 Oncology Pharmacist Encounter  Per Dr. Sherrod, patient will not be making up C1D15 of Obinutuzumab  (patient was admitted). Treatment plan has been updated to reflect this. Patient will resume obinutuzumab  as planned on C2D1 on 07/23/24.  Called and spoke with Mr. Putzier and he knows to not start venetoclax  until he has his baseline labs done on 07/15/24 AM. Estimated arrival date of medication to patient is 07/14/24.   Asberry Macintosh, PharmD, BCPS, BCOP Hematology/Oncology Clinical Pharmacist [phone] 07/13/2024 10:38 AM

## 2024-07-13 NOTE — Addendum Note (Signed)
 Addended by: GEOFFRY STABS F on: 07/13/2024 01:26 PM   Modules accepted: Orders

## 2024-07-13 NOTE — Progress Notes (Signed)
 The patient was prescribed a #28 count of the medication, whereas he requires only #21 tablets to complete his prescribed cycle. I have informed him of the situation and advised that he bring the remaining 7 tablets to his next appointment. He acknowledged this instruction and confirmed that he will bring the prescription with him.

## 2024-07-15 ENCOUNTER — Telehealth: Payer: Self-pay | Admitting: Medical Oncology

## 2024-07-15 ENCOUNTER — Inpatient Hospital Stay

## 2024-07-15 ENCOUNTER — Encounter: Payer: Self-pay | Admitting: Internal Medicine

## 2024-07-15 DIAGNOSIS — C9110 Chronic lymphocytic leukemia of B-cell type not having achieved remission: Secondary | ICD-10-CM

## 2024-07-15 DIAGNOSIS — Z9221 Personal history of antineoplastic chemotherapy: Secondary | ICD-10-CM | POA: Diagnosis not present

## 2024-07-15 DIAGNOSIS — Z923 Personal history of irradiation: Secondary | ICD-10-CM | POA: Diagnosis not present

## 2024-07-15 DIAGNOSIS — Z5111 Encounter for antineoplastic chemotherapy: Secondary | ICD-10-CM | POA: Diagnosis present

## 2024-07-15 DIAGNOSIS — Z933 Colostomy status: Secondary | ICD-10-CM | POA: Diagnosis not present

## 2024-07-15 DIAGNOSIS — Z79899 Other long term (current) drug therapy: Secondary | ICD-10-CM | POA: Diagnosis not present

## 2024-07-15 DIAGNOSIS — Z85048 Personal history of other malignant neoplasm of rectum, rectosigmoid junction, and anus: Secondary | ICD-10-CM | POA: Diagnosis not present

## 2024-07-15 DIAGNOSIS — D696 Thrombocytopenia, unspecified: Secondary | ICD-10-CM | POA: Diagnosis not present

## 2024-07-15 LAB — CBC WITH DIFFERENTIAL (CANCER CENTER ONLY)
Abs Immature Granulocytes: 0.03 K/uL (ref 0.00–0.07)
Basophils Absolute: 0.1 K/uL (ref 0.0–0.1)
Basophils Absolute: 0.1 K/uL (ref 0.0–0.1)
Basophils Relative: 1 %
Basophils Relative: 1 %
Eosinophils Absolute: 0.2 K/uL (ref 0.0–0.5)
Eosinophils Absolute: 0.5 K/uL (ref 0.0–0.5)
Eosinophils Relative: 2 %
Eosinophils Relative: 5 %
HCT: 31.8 % — ABNORMAL LOW (ref 39.0–52.0)
HCT: 32.5 % — ABNORMAL LOW (ref 39.0–52.0)
Hemoglobin: 10.6 g/dL — ABNORMAL LOW (ref 13.0–17.0)
Hemoglobin: 10.9 g/dL — ABNORMAL LOW (ref 13.0–17.0)
Immature Granulocytes: 0 %
Lymphocytes Relative: 56 %
Lymphocytes Relative: 54 %
Lymphs Abs: 5.1 K/uL — ABNORMAL HIGH (ref 0.7–4.0)
Lymphs Abs: 5.1 K/uL — ABNORMAL HIGH (ref 0.7–4.0)
MCH: 31.5 pg (ref 26.0–34.0)
MCH: 31.9 pg (ref 26.0–34.0)
MCHC: 33.3 g/dL (ref 30.0–36.0)
MCHC: 33.5 g/dL (ref 30.0–36.0)
MCV: 94.6 fL (ref 80.0–100.0)
MCV: 95 fL (ref 80.0–100.0)
Monocytes Absolute: 0.4 K/uL (ref 0.1–1.0)
Monocytes Absolute: 1.4 K/uL — ABNORMAL HIGH (ref 0.1–1.0)
Monocytes Relative: 4 %
Monocytes Relative: 15 %
Neutro Abs: 3.4 K/uL (ref 1.7–7.7)
Neutro Abs: 2.4 K/uL (ref 1.7–7.7)
Neutrophils Relative %: 37 %
Neutrophils Relative %: 25 %
Platelet Count: 55 K/uL — ABNORMAL LOW (ref 150–400)
Platelet Count: 60 K/uL — ABNORMAL LOW (ref 150–400)
RBC: 3.36 MIL/uL — ABNORMAL LOW (ref 4.22–5.81)
RBC: 3.42 MIL/uL — ABNORMAL LOW (ref 4.22–5.81)
RDW: 17.2 % — ABNORMAL HIGH (ref 11.5–15.5)
RDW: 17.4 % — ABNORMAL HIGH (ref 11.5–15.5)
Smear Review: NORMAL
Smear Review: NORMAL
WBC Count: 9.1 K/uL (ref 4.0–10.5)
WBC Count: 9.5 K/uL (ref 4.0–10.5)
nRBC: 0.3 % — ABNORMAL HIGH (ref 0.0–0.2)
nRBC: 0 % (ref 0.0–0.2)

## 2024-07-15 LAB — CMP (CANCER CENTER ONLY)
ALT: 15 U/L (ref 0–44)
ALT: 15 U/L (ref 0–44)
AST: 86 U/L — ABNORMAL HIGH (ref 15–41)
AST: 84 U/L — ABNORMAL HIGH (ref 15–41)
Albumin: 3.9 g/dL (ref 3.5–5.0)
Albumin: 3.9 g/dL (ref 3.5–5.0)
Alkaline Phosphatase: 50 U/L (ref 38–126)
Alkaline Phosphatase: 52 U/L (ref 38–126)
Anion gap: 12 (ref 5–15)
Anion gap: 12 (ref 5–15)
BUN: 10 mg/dL (ref 8–23)
BUN: 11 mg/dL (ref 8–23)
CO2: 24 mmol/L (ref 22–32)
CO2: 25 mmol/L (ref 22–32)
Calcium: 8.9 mg/dL (ref 8.9–10.3)
Calcium: 8.8 mg/dL — ABNORMAL LOW (ref 8.9–10.3)
Chloride: 105 mmol/L (ref 98–111)
Chloride: 104 mmol/L (ref 98–111)
Creatinine: 0.59 mg/dL — ABNORMAL LOW (ref 0.61–1.24)
Creatinine: 0.85 mg/dL (ref 0.61–1.24)
GFR, Estimated: >60 mL/min (ref >60)
GFR, Estimated: >60 mL/min (ref >60)
Glucose, Bld: 85 mg/dL (ref 70–99)
Glucose, Bld: 91 mg/dL (ref 70–99)
Potassium: 3.7 mmol/L (ref 3.5–5.1)
Potassium: 3.5 mmol/L (ref 3.5–5.1)
Sodium: 141 mmol/L (ref 135–145)
Sodium: 141 mmol/L (ref 135–145)
Total Bilirubin: 1.5 mg/dL — ABNORMAL HIGH (ref 0.0–1.2)
Total Bilirubin: 2 mg/dL — ABNORMAL HIGH (ref 0.0–1.2)
Total Protein: 5.7 g/dL — ABNORMAL LOW (ref 6.5–8.1)
Total Protein: 5.7 g/dL — ABNORMAL LOW (ref 6.5–8.1)

## 2024-07-15 LAB — LACTATE DEHYDROGENASE
LDH: 2991 U/L — ABNORMAL HIGH (ref 98–192)
LDH: 3566 U/L — ABNORMAL HIGH (ref 98–192)

## 2024-07-15 LAB — URIC ACID
Uric Acid, Serum: 7.1 mg/dL (ref 3.7–8.6)
Uric Acid, Serum: 7.5 mg/dL (ref 3.7–8.6)

## 2024-07-15 NOTE — Telephone Encounter (Signed)
 Aaron Gonzalez and I met with pt and his niece. Pt has his his Venetoclax  and will start it when he gets home from his lab appt. Pt understands he needs to remove 7 pills from his supply and can bring them back this afternoon or discard them. He understands the lab appointment schedule and  will return this afternoon for labs again.

## 2024-07-16 ENCOUNTER — Inpatient Hospital Stay

## 2024-07-16 DIAGNOSIS — Z5111 Encounter for antineoplastic chemotherapy: Secondary | ICD-10-CM | POA: Diagnosis not present

## 2024-07-16 DIAGNOSIS — C9110 Chronic lymphocytic leukemia of B-cell type not having achieved remission: Secondary | ICD-10-CM

## 2024-07-16 LAB — CMP (CANCER CENTER ONLY)
ALT: 13 U/L (ref 0–44)
AST: 73 U/L — ABNORMAL HIGH (ref 15–41)
Albumin: 3.9 g/dL (ref 3.5–5.0)
Alkaline Phosphatase: 46 U/L (ref 38–126)
Anion gap: 11 (ref 5–15)
BUN: 10 mg/dL (ref 8–23)
CO2: 23 mmol/L (ref 22–32)
Calcium: 8.8 mg/dL — ABNORMAL LOW (ref 8.9–10.3)
Chloride: 104 mmol/L (ref 98–111)
Creatinine: 0.81 mg/dL (ref 0.61–1.24)
GFR, Estimated: >60 mL/min (ref >60)
Glucose, Bld: 76 mg/dL (ref 70–99)
Potassium: 3.9 mmol/L (ref 3.5–5.1)
Sodium: 139 mmol/L (ref 135–145)
Total Bilirubin: 1.8 mg/dL — ABNORMAL HIGH (ref 0.0–1.2)
Total Protein: 5.6 g/dL — ABNORMAL LOW (ref 6.5–8.1)

## 2024-07-16 LAB — CBC WITH DIFFERENTIAL (CANCER CENTER ONLY)
Abs Immature Granulocytes: 0.03 K/uL (ref 0.00–0.07)
Basophils Absolute: 0.2 K/uL — ABNORMAL HIGH (ref 0.0–0.1)
Basophils Relative: 2 %
Eosinophils Absolute: 0.4 K/uL (ref 0.0–0.5)
Eosinophils Relative: 4 %
HCT: 31.8 % — ABNORMAL LOW (ref 39.0–52.0)
Hemoglobin: 10.4 g/dL — ABNORMAL LOW (ref 13.0–17.0)
Immature Granulocytes: 0 %
Lymphocytes Relative: 54 %
Lymphs Abs: 4.5 K/uL — ABNORMAL HIGH (ref 0.7–4.0)
MCH: 31.6 pg (ref 26.0–34.0)
MCHC: 32.7 g/dL (ref 30.0–36.0)
MCV: 96.7 fL (ref 80.0–100.0)
Monocytes Absolute: 1.3 K/uL — ABNORMAL HIGH (ref 0.1–1.0)
Monocytes Relative: 15 %
Neutro Abs: 2.1 K/uL (ref 1.7–7.7)
Neutrophils Relative %: 25 %
Platelet Count: 60 K/uL — ABNORMAL LOW (ref 150–400)
RBC: 3.29 MIL/uL — ABNORMAL LOW (ref 4.22–5.81)
RDW: 17.3 % — ABNORMAL HIGH (ref 11.5–15.5)
Smear Review: NORMAL
WBC Count: 8.4 K/uL (ref 4.0–10.5)
nRBC: 0.2 % (ref 0.0–0.2)

## 2024-07-16 LAB — LACTATE DEHYDROGENASE: LDH: >2500 U/L — ABNORMAL HIGH (ref 98–192)

## 2024-07-16 LAB — URIC ACID: Uric Acid, Serum: 6.5 mg/dL (ref 3.7–8.6)

## 2024-07-22 ENCOUNTER — Inpatient Hospital Stay

## 2024-07-22 ENCOUNTER — Inpatient Hospital Stay: Attending: Internal Medicine | Admitting: Internal Medicine

## 2024-07-22 VITALS — BP 181/103 | HR 62 | Temp 97.6°F | Resp 17 | Ht 65.0 in | Wt 158.0 lb

## 2024-07-22 DIAGNOSIS — C9110 Chronic lymphocytic leukemia of B-cell type not having achieved remission: Secondary | ICD-10-CM

## 2024-07-22 DIAGNOSIS — Z923 Personal history of irradiation: Secondary | ICD-10-CM | POA: Diagnosis not present

## 2024-07-22 DIAGNOSIS — Z85048 Personal history of other malignant neoplasm of rectum, rectosigmoid junction, and anus: Secondary | ICD-10-CM | POA: Insufficient documentation

## 2024-07-22 DIAGNOSIS — Z5111 Encounter for antineoplastic chemotherapy: Secondary | ICD-10-CM | POA: Insufficient documentation

## 2024-07-22 DIAGNOSIS — Z79899 Other long term (current) drug therapy: Secondary | ICD-10-CM | POA: Diagnosis not present

## 2024-07-22 DIAGNOSIS — Z9221 Personal history of antineoplastic chemotherapy: Secondary | ICD-10-CM | POA: Insufficient documentation

## 2024-07-22 DIAGNOSIS — Z7969 Long term (current) use of other immunomodulators and immunosuppressants: Secondary | ICD-10-CM | POA: Insufficient documentation

## 2024-07-22 LAB — CBC WITH DIFFERENTIAL (CANCER CENTER ONLY)
Basophils Absolute: 0.1 K/uL (ref 0.0–0.1)
Basophils Absolute: 0.1 K/uL (ref 0.0–0.1)
Basophils Relative: 1 %
Basophils Relative: 1 %
Eosinophils Absolute: 0.2 K/uL (ref 0.0–0.5)
Eosinophils Absolute: 0.4 K/uL (ref 0.0–0.5)
Eosinophils Relative: 2 %
Eosinophils Relative: 5 %
HCT: 32.4 % — ABNORMAL LOW (ref 39.0–52.0)
HCT: 31 % — ABNORMAL LOW (ref 39.0–52.0)
Hemoglobin: 10.8 g/dL — ABNORMAL LOW (ref 13.0–17.0)
Hemoglobin: 10.2 g/dL — ABNORMAL LOW (ref 13.0–17.0)
Lymphocytes Relative: 64 %
Lymphocytes Relative: 51 %
Lymphs Abs: 5.1 K/uL — ABNORMAL HIGH (ref 0.7–4.0)
Lymphs Abs: 3.8 K/uL (ref 0.7–4.0)
MCH: 32.8 pg (ref 26.0–34.0)
MCH: 32.7 pg (ref 26.0–34.0)
MCHC: 33.3 g/dL (ref 30.0–36.0)
MCHC: 32.9 g/dL (ref 30.0–36.0)
MCV: 98.5 fL (ref 80.0–100.0)
MCV: 99.4 fL (ref 80.0–100.0)
Monocytes Absolute: 0.6 K/uL (ref 0.1–1.0)
Monocytes Absolute: 0.7 K/uL (ref 0.1–1.0)
Monocytes Relative: 8 %
Monocytes Relative: 9 %
Neutro Abs: 2 K/uL (ref 1.7–7.7)
Neutro Abs: 2.6 K/uL (ref 1.7–7.7)
Neutrophils Relative %: 25 %
Neutrophils Relative %: 34 %
Platelet Count: 117 K/uL — ABNORMAL LOW (ref 150–400)
Platelet Count: 118 K/uL — ABNORMAL LOW (ref 150–400)
RBC: 3.29 MIL/uL — ABNORMAL LOW (ref 4.22–5.81)
RBC: 3.12 MIL/uL — ABNORMAL LOW (ref 4.22–5.81)
RDW: 19.3 % — ABNORMAL HIGH (ref 11.5–15.5)
RDW: 19.5 % — ABNORMAL HIGH (ref 11.5–15.5)
Smear Review: NORMAL
Smear Review: NORMAL
WBC Count: 7.9 K/uL (ref 4.0–10.5)
WBC Count: 7.5 K/uL (ref 4.0–10.5)
nRBC: 0 % (ref 0.0–0.2)
nRBC: 0 % (ref 0.0–0.2)

## 2024-07-22 LAB — CMP (CANCER CENTER ONLY)
ALT: 13 U/L (ref 0–44)
ALT: 13 U/L (ref 0–44)
AST: 59 U/L — ABNORMAL HIGH (ref 15–41)
AST: 48 U/L — ABNORMAL HIGH (ref 15–41)
Albumin: 4.1 g/dL (ref 3.5–5.0)
Albumin: 3.9 g/dL (ref 3.5–5.0)
Alkaline Phosphatase: 47 U/L (ref 38–126)
Alkaline Phosphatase: 47 U/L (ref 38–126)
Anion gap: 11 (ref 5–15)
Anion gap: 13 (ref 5–15)
BUN: 12 mg/dL (ref 8–23)
BUN: 12 mg/dL (ref 8–23)
CO2: 24 mmol/L (ref 22–32)
CO2: 24 mmol/L (ref 22–32)
Calcium: 9.1 mg/dL (ref 8.9–10.3)
Calcium: 9 mg/dL (ref 8.9–10.3)
Chloride: 102 mmol/L (ref 98–111)
Chloride: 104 mmol/L (ref 98–111)
Creatinine: 0.96 mg/dL (ref 0.61–1.24)
Creatinine: 0.95 mg/dL (ref 0.61–1.24)
GFR, Estimated: >60 mL/min (ref >60)
GFR, Estimated: >60 mL/min (ref >60)
Glucose, Bld: 76 mg/dL (ref 70–99)
Glucose, Bld: 81 mg/dL (ref 70–99)
Potassium: 3.8 mmol/L (ref 3.5–5.1)
Potassium: 3.8 mmol/L (ref 3.5–5.1)
Sodium: 138 mmol/L (ref 135–145)
Sodium: 141 mmol/L (ref 135–145)
Total Bilirubin: 1.4 mg/dL — ABNORMAL HIGH (ref 0.0–1.2)
Total Bilirubin: 1.1 mg/dL (ref 0.0–1.2)
Total Protein: 5.8 g/dL — ABNORMAL LOW (ref 6.5–8.1)
Total Protein: 5.6 g/dL — ABNORMAL LOW (ref 6.5–8.1)

## 2024-07-22 LAB — URIC ACID
Uric Acid, Serum: 6.4 mg/dL (ref 3.7–8.6)
Uric Acid, Serum: 6.1 mg/dL (ref 3.7–8.6)

## 2024-07-22 LAB — LACTATE DEHYDROGENASE
LDH: 1822 U/L — ABNORMAL HIGH (ref 98–192)
LDH: 1838 U/L — ABNORMAL HIGH (ref 98–192)

## 2024-07-22 NOTE — Progress Notes (Signed)
 [facility] [phone]   [phone]  OFFICE PROGRESS NOTE  Aaron Leni Edyth DELENA, Aaron Gonzalez [address] [place] [address]  DIAGNOSIS:  1) History of rectal cancer in 2007 status post concurrent chemoradiation followed by surgery. 2) Chronic lymphocytic leukemia diagnosed in 2007.  PRIOR THERAPY:  1) Ibrutinib  420 mg p.o. daily first 2007 and discontinued August 26, 2023 secondary to disease progression. 2) Acalabrutinib  100 mg p.o. twice daily.  First dose started August 28, 2023.  This discontinued and August 2025 secondary to disease progression and rising total white blood count with anemia and thrombocytopenia.  CURRENT THERAPY: Infusional obinutuzumab  in addition to venetoclax .  Status post 1 cycle.  INTERVAL HISTORY: Aaron Gonzalez 78 y.o. male returns to the clinic today for follow-up visit follow-up visit accompanied by his sister.  The patient is feeling fine today with no concerning complaints except for the mild fatigue.  He tolerated the first cycle of his treatment fairly well except for admission to the hospital 2 weeks ago secondary to significant pancytopenia with his treatment and dehydration. He denied having any current chest pain, shortness of breath, cough or hemoptysis.  He has no nausea, vomiting, diarrhea or constipation.  He has no headache or visual changes.  He denied having any fever or chills.  He is here today for evaluation before starting cycle #2 of his treatment tomorrow. Discussed the use of AI scribe software for clinical note transcription with the patient, who gave verbal consent to proceed.  History of Present Illness     MEDICAL HISTORY: Past Medical History:  Diagnosis Date   Arthritis    CLL (chronic lymphocytic leukemia) (HCC)    DVT (deep venous thrombosis) (HCC)    History of rectal cancer    Hypertension    Stroke (HCC)     ALLERGIES:  is allergic to gazyva  [obinutuzumab ].  MEDICATIONS:   Current Outpatient Medications  Medication Sig Dispense Refill   acetaminophen  (TYLENOL ) 325 MG tablet Take 2 tablets (650 mg total) by mouth every 6 (six) hours as needed for mild pain (pain score 1-3) or fever (or Fever >/= 101). 20 tablet 0   acyclovir  (ZOVIRAX ) 400 MG tablet Take 1 tablet (400 mg total) by mouth daily. 30 tablet 5   allopurinol  (ZYLOPRIM ) 300 MG tablet Take 1 tablet (300 mg total) by mouth daily. 30 tablet 0   amLODipine  (NORVASC ) 10 MG tablet Take 1 tablet by mouth daily.     blood glucose meter kit and supplies KIT Dispense based on patient and insurance preference. Use up to four times daily as directed. 1 each 0   carvedilol  (COREG ) 6.25 MG tablet TAKE 1 TABLET(6.25 MG) BY MOUTH TWICE DAILY 180 tablet 3   ondansetron  (ZOFRAN ) 8 MG tablet Take 1 tablet (8 mg total) by mouth every 8 (eight) hours as needed for nausea or vomiting. 30 tablet 1   prochlorperazine  (COMPAZINE ) 10 MG tablet Take 1 tablet (10 mg total) by mouth every 6 (six) hours as needed for nausea or vomiting. 30 tablet 1   senna-docusate (SENOKOT-S) 8.6-50 MG tablet Take 1 tablet by mouth at bedtime as needed for mild constipation. 30 tablet 0   venetoclax  (VENCLEXTA ) 10 MG tablet Take 1 tablet (10 mg total) by mouth daily for first week of treatment, then increase to 2 tablets (20 mg total) by mouth daily for second week of treatment.Take with food and water . Take as instructed per Aaron Gonzalez. Start: 07/15/24 28  tablet 0   venetoclax  (VENCLEXTA ) 100 MG tablet Take 1 tablet (100 mg total) by mouth daily for fourth week of treatment. Take with food and water . Start 08/05/24 7 tablet 0   venetoclax  (VENCLEXTA ) 50 MG tablet Take 1 tablet (50 mg total) by mouth daily for third week of treatment. Take with food and water . Start 07/29/24 7 tablet 0   No current facility-administered medications for this visit.    SURGICAL HISTORY:  Past Surgical History:  Procedure Laterality Date   COLECTOMY     with colostomy   TOTAL  HIP ARTHROPLASTY Left 10/26/2021   Procedure: Left TOTAL HIP ARTHROPLASTY ANTERIOR APPROACH;  Surgeon: Vernetta Lonni GRADE, Aaron Gonzalez;  Location: [facility];  Service: Orthopedics;  Laterality: Left;  RNFA    REVIEW OF SYSTEMS:  Constitutional: positive for fatigue Eyes: negative Ears, nose, mouth, throat, and face: negative Respiratory: negative Cardiovascular: negative Gastrointestinal: negative Genitourinary:negative Integument/breast: negative Hematologic/lymphatic: negative Musculoskeletal:negative Neurological: negative Behavioral/Psych: negative Endocrine: negative Allergic/Immunologic: negative   PHYSICAL EXAMINATION: General appearance: alert, cooperative, fatigued, and no distress Head: Normocephalic, without obvious abnormality, atraumatic Neck: no adenopathy, no JVD, supple, symmetrical, trachea midline, and thyroid not enlarged, symmetric, no tenderness/mass/nodules Lymph nodes: Cervical, supraclavicular, and axillary nodes normal. Resp: clear to auscultation bilaterally Back: symmetric, no curvature. ROM normal. No CVA tenderness. Cardio: regular rate and rhythm, S1, S2 normal, no murmur, click, rub or gallop GI: soft, non-tender; bowel sounds normal; no masses,  no organomegaly Extremities: extremities normal, atraumatic, no cyanosis or edema Neurologic: Alert and oriented X 3, normal strength and tone. Normal symmetric reflexes. Normal coordination and gait  ECOG PERFORMANCE STATUS: 1 - Symptomatic but completely ambulatory  Blood pressure (!) 181/103, pulse 62, temperature 97.6 F (36.4 C), temperature source Temporal, resp. rate 17, height 5' 5 (1.651 m), weight 158 lb (71.7 kg), SpO2 98%.  LABORATORY DATA: Lab Results  Component Value Date   WBC 7.5 07/22/2024   HGB 10.2 (L) 07/22/2024   HCT 31.0 (L) 07/22/2024   MCV 99.4 07/22/2024   PLT 118 (L) 07/22/2024      Chemistry      Component Value Date/Time   NA 141 07/22/2024 1338   NA 139 06/28/2021 0954   K  3.8 07/22/2024 1338   CL 104 07/22/2024 1338   CO2 24 07/22/2024 1338   BUN 12 07/22/2024 1338   BUN 11 06/28/2021 0954   CREATININE 0.95 07/22/2024 1338      Component Value Date/Time   CALCIUM  9.0 07/22/2024 1338   ALKPHOS 47 07/22/2024 1338   AST 48 (H) 07/22/2024 1338   ALT 13 07/22/2024 1338   BILITOT 1.1 07/22/2024 1338       RADIOGRAPHIC STUDIES: CT HEAD WO CONTRAST Result Date: 07/06/2024 EXAM: CT HEAD WITHOUT CONTRAST 07/06/2024 06:15:00 AM TECHNIQUE: CT of the head was performed without the administration of intravenous contrast. Automated exposure control, iterative reconstruction, and/or weight based adjustment of the mA/kV was utilized to reduce the radiation dose to as low as reasonably achievable. COMPARISON: CT of the head dated 06/14/2021. CLINICAL HISTORY: Mental status change, unknown cause. Per pt's son he is confused. Code sepsis. Pt currently receiving chemo. FINDINGS: BRAIN AND VENTRICLES: No acute hemorrhage. No evidence of acute infarct. No hydrocephalus. No extra-axial collection. No mass effect or midline shift. Age-related atrophy and mild periventricular white matter disease. Mineralization present posterolaterally within the right thalamus related to remote infarct. ORBITS: No acute abnormality. SINUSES: No acute abnormality. SOFT TISSUES AND SKULL: No acute soft tissue  abnormality. No skull fracture. Calcifications within the carotid siphons. IMPRESSION: 1. No acute intracranial abnormality. 2. Age-related atrophy and mild periventricular white matter disease. 3. 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 Electronically signed by: Evalene Coho Aaron Gonzalez 07/06/2024 06:29 AM EDT RP Workstation: [id number]   CT CHEST ABDOMEN PELVIS W CONTRAST Result Date: 07/05/2024 CLINICAL DATA:  Sepsis Febrile. Pt recently underwent surgery for rectal cancer. Currently receiving Chemo. Pt denies any pain EXAM: CT CHEST, ABDOMEN, AND PELVIS WITH CONTRAST  TECHNIQUE: Multidetector CT imaging of the chest, abdomen and pelvis was performed following the standard protocol during bolus administration of intravenous contrast. RADIATION DOSE REDUCTION: This exam was performed according to the departmental dose-optimization program which includes automated exposure control, adjustment of the mA and/or kV according to patient size and/or use of iterative reconstruction technique. CONTRAST:  OMNIPAQUE  IOHEXOL  300 MG/ML  SOLN COMPARISON:  CT chest abdomen pelvis 01/06/2024 FINDINGS: CT CHEST FINDINGS Cardiovascular: Normal heart size. No significant pericardial effusion. The thoracic aorta is normal in caliber. Aberrant right subclavian artery. Mild atherosclerotic plaque of the thoracic aorta. Left anterior descending coronary artery calcifications. Mediastinum/Nodes: No enlarged mediastinal, hilar, or axillary lymph nodes. Thyroid gland, trachea, and esophagus demonstrate no significant findings. Tiny hiatal hernia. Lungs/Pleura: No focal consolidation. No pulmonary nodule. No pulmonary mass. No pleural effusion. No pneumothorax. Musculoskeletal: No chest wall abnormality. No suspicious lytic or blastic osseous lesions. No acute displaced fracture. Multilevel degenerative changes of the spine. CT ABDOMEN PELVIS FINDINGS Hepatobiliary: The liver is enlarged measuring up to 23 cm. No focal liver abnormality. No gallstones, gallbladder wall thickening, or pericholecystic fluid. No biliary dilatation. Pancreas: No focal lesion. Normal pancreatic contour. No surrounding inflammatory changes. No main pancreatic ductal dilatation. Spleen: The spleen is enlarged measuring up to 14 cm. No focal splenic lesion. Adrenals/Urinary Tract: No adrenal nodule bilaterally. Bilateral kidneys enhance symmetrically. Fluid density lesions of the kidneys likely represent simple renal cysts. Simple renal cysts, in the absence of clinically indicated signs/symptoms, require no independent  follow-up. No hydronephrosis. No hydroureter. Punctate right nephrolithiasis. No left nephrolithiasis. No ureterolithiasis bilaterally. The urinary bladder is not well visualized due to streak artifact originating from bilateral femoral surgical hardware. Stomach/Bowel: Left lower quadrant end colostomy. Stomach is within normal limits. No evidence of bowel wall thickening or dilatation. Vascular/Lymphatic: No abdominal aorta or iliac aneurysm. Mild atherosclerotic plaque of the aorta and its branches. No abdominal, pelvic, or inguinal lymphadenopathy. Reproductive: Not visualized due to streak artifact originating from bilateral femoral surgical hardware. Other: No intraperitoneal free fluid. No intraperitoneal free gas. No organized fluid collection. Musculoskeletal: No abdominal wall hernia or abnormality. No suspicious lytic or blastic osseous lesions. No acute displaced fracture. Multilevel degenerative changes of the spine. Total bilateral hip arthroplasty. IMPRESSION: 1. No acute intrathoracic abnormality. 2. Limited evaluation of the pelvis due to streak artifact originating from bilateral femoral surgical hardware. 3. Hepatosplenomegaly. 4. Nonobstructive punctate right nephrolithiasis. 5.  Aortic Atherosclerosis (ICD10-I70.0). 6. Incidentally noted aberrant right subclavian artery. Electronically Signed   By: Morgane  Naveau M.D.   On: 07/05/2024 23:39   DG Chest Port 1 View Result Date: 07/05/2024 CLINICAL DATA:  Questionable sepsis - evaluate for abnormality EXAM: PORTABLE CHEST 1 VIEW COMPARISON:  Chest x-ray 04/01/2021 FINDINGS: The heart and mediastinal contours are unchanged. Atherosclerotic plaque. No focal consolidation. No pulmonary edema. No pleural effusion. No pneumothorax. No acute osseous abnormality. IMPRESSION: No active disease. Electronically Signed   By: Morgane  Naveau M.D.   On: 07/05/2024 19:38  ASSESSMENT AND PLAN: This is a very pleasant 78 years old African-American male  with history of rectal cancer diagnosed in 2007 status post concurrent chemoradiation followed by surgical resection.  At the same time the patient was diagnosed with chronic lymphocytic leukemia and has been in observation initially for several years but started on treatment with ibrutinib  420 mg in 2019.  He tolerated his treatment well until November 2024 when he had evidence for disease progression.  We switched his treatment to acalabrutinib  100 mg p.o. twice daily started August 28, 2023 and he has been tolerating it fairly well. CBC today showed continuous increase in the total white blood count suspicious for disease progression. He is currently undergoing treatment with obinutuzumab  and oral venetoclax . He has been tolerating this treatment fairly well. He has significant improvement in his blood count after starting this regimen. He is scheduled for day 1 of cycle #2 tomorrow. I recommended for the patient to continue his treatment as planned and he will come back for follow-up visit in 2 weeks for evaluation and management of any adverse effect of his treatment. He will continue to have weekly lab for close monitoring of his electrolytes.  Assessment and Plan Assessment & Plan    The patient voices understanding of current disease status and treatment options and is in agreement with the current care plan. All questions were answered. The patient knows to call the clinic with any problems, questions or concerns. We can certainly see the patient much sooner if necessary. The total time spent in the appointment was 40 minutes including review of chart and various tests results, discussions about plan of care and coordination of care plan .  Disclaimer: This note was dictated with voice recognition software. Similar sounding words can inadvertently be transcribed and may not be corrected upon review.

## 2024-07-23 ENCOUNTER — Encounter: Payer: Self-pay | Admitting: Internal Medicine

## 2024-07-23 ENCOUNTER — Other Ambulatory Visit: Payer: Self-pay | Admitting: Physician Assistant

## 2024-07-23 ENCOUNTER — Inpatient Hospital Stay

## 2024-07-23 ENCOUNTER — Other Ambulatory Visit: Payer: Self-pay | Admitting: Internal Medicine

## 2024-07-23 ENCOUNTER — Other Ambulatory Visit: Payer: Self-pay

## 2024-07-23 ENCOUNTER — Other Ambulatory Visit (HOSPITAL_COMMUNITY): Payer: Self-pay

## 2024-07-23 VITALS — BP 141/87 | HR 84 | Temp 98.1°F | Resp 18

## 2024-07-23 DIAGNOSIS — Z5111 Encounter for antineoplastic chemotherapy: Secondary | ICD-10-CM | POA: Diagnosis not present

## 2024-07-23 DIAGNOSIS — C9110 Chronic lymphocytic leukemia of B-cell type not having achieved remission: Secondary | ICD-10-CM

## 2024-07-23 DIAGNOSIS — I159 Secondary hypertension, unspecified: Secondary | ICD-10-CM

## 2024-07-23 LAB — CMP (CANCER CENTER ONLY)
ALT: 15 U/L (ref 0–44)
AST: 55 U/L — ABNORMAL HIGH (ref 15–41)
Albumin: 3.9 g/dL (ref 3.5–5.0)
Alkaline Phosphatase: 44 U/L (ref 38–126)
Anion gap: 11 (ref 5–15)
BUN: 11 mg/dL (ref 8–23)
CO2: 25 mmol/L (ref 22–32)
Calcium: 9.1 mg/dL (ref 8.9–10.3)
Chloride: 106 mmol/L (ref 98–111)
Creatinine: 0.89 mg/dL (ref 0.61–1.24)
GFR, Estimated: >60 mL/min (ref >60)
Glucose, Bld: 77 mg/dL (ref 70–99)
Potassium: 4.1 mmol/L (ref 3.5–5.1)
Sodium: 142 mmol/L (ref 135–145)
Total Bilirubin: 1.1 mg/dL (ref 0.0–1.2)
Total Protein: 5.6 g/dL — ABNORMAL LOW (ref 6.5–8.1)

## 2024-07-23 LAB — CBC WITH DIFFERENTIAL (CANCER CENTER ONLY)
Basophils Absolute: 0.1 K/uL (ref 0.0–0.1)
Basophils Relative: 2 %
Eosinophils Absolute: 0.1 K/uL (ref 0.0–0.5)
Eosinophils Relative: 2 %
HCT: 31.3 % — ABNORMAL LOW (ref 39.0–52.0)
Hemoglobin: 10.4 g/dL — ABNORMAL LOW (ref 13.0–17.0)
Lymphocytes Relative: 57 %
Lymphs Abs: 4.2 K/uL — ABNORMAL HIGH (ref 0.7–4.0)
MCH: 33.1 pg (ref 26.0–34.0)
MCHC: 33.2 g/dL (ref 30.0–36.0)
MCV: 99.7 fL (ref 80.0–100.0)
Monocytes Absolute: 0.6 K/uL (ref 0.1–1.0)
Monocytes Relative: 8 %
Neutro Abs: 2.3 K/uL (ref 1.7–7.7)
Neutrophils Relative %: 31 %
Platelet Count: 116 K/uL — ABNORMAL LOW (ref 150–400)
RBC: 3.14 MIL/uL — ABNORMAL LOW (ref 4.22–5.81)
RDW: 19.7 % — ABNORMAL HIGH (ref 11.5–15.5)
WBC Count: 7.3 K/uL (ref 4.0–10.5)
nRBC: 0 % (ref 0.0–0.2)

## 2024-07-23 MED ORDER — SODIUM CHLORIDE 0.9 % IV SOLN: INTRAVENOUS | Status: DC

## 2024-07-23 MED ORDER — SODIUM CHLORIDE 0.9 % IV SOLN
1000.0000 mg | Freq: Once | INTRAVENOUS | Status: AC
  Administered 2024-07-23: 1000 mg via INTRAVENOUS
  Filled 2024-07-23: qty 40

## 2024-07-23 MED ORDER — FAMOTIDINE IN NACL 20-0.9 MG/50ML-% IV SOLN
20.0000 mg | Freq: Once | INTRAVENOUS | Status: AC
  Administered 2024-07-23: 20 mg via INTRAVENOUS
  Filled 2024-07-23: qty 50

## 2024-07-23 MED ORDER — LIDOCAINE-PRILOCAINE 2.5-2.5 % EX CREA
1.0000 | TOPICAL_CREAM | CUTANEOUS | 2 refills | Status: AC | PRN
  Filled 2024-07-23: qty 30, 30d supply, fill #0

## 2024-07-23 MED ORDER — ACETAMINOPHEN 325 MG PO TABS
650.0000 mg | ORAL_TABLET | Freq: Once | ORAL | Status: AC
  Administered 2024-07-23: 650 mg via ORAL
  Filled 2024-07-23: qty 2

## 2024-07-23 MED ORDER — CETIRIZINE HCL 10 MG/ML IV SOLN
10.0000 mg | Freq: Once | INTRAVENOUS | Status: AC
  Administered 2024-07-23: 10 mg via INTRAVENOUS
  Filled 2024-07-23: qty 1

## 2024-07-23 MED ORDER — CLONIDINE HCL 0.1 MG PO TABS
0.1000 mg | ORAL_TABLET | Freq: Once | ORAL | Status: AC
  Administered 2024-07-23: 0.1 mg via ORAL
  Filled 2024-07-23: qty 1

## 2024-07-23 MED ORDER — METHYLPREDNISOLONE SODIUM SUCC 125 MG IJ SOLR
125.0000 mg | Freq: Once | INTRAMUSCULAR | Status: AC
  Administered 2024-07-23: 125 mg via INTRAVENOUS
  Filled 2024-07-23: qty 2

## 2024-07-23 NOTE — Progress Notes (Signed)
 Extravasation/Infiltration Adverse Event Note  Date of Occurrence: 07/23/24  Time of Occurrence: 1226  Time of Discovery: 1226  Drug Involved: gazyva   Approximate Amount of Drug Infiltrated (ml): 1-2 ml  Approximate Amount of Drug Remaining in Syringe/Bag (mL): 240  Route of Administration:   LDA Type: PIV  LDA Needle Gauge: 24 G  LDA Needle/Catheter Length: 0.75in  LDA Location: Left  forearm  LDA Access/Placement Time: 0929  Number of Access Attempts: 3   Patient Complaints and Clinical Presentation:   Patient Symptoms: Pain and Swelling  Clinical Assessment:  Site Dimensions (mm): 40mm  Site Color: Grade 1 - pink  Site Integrity: Grade 0 - Unbroken  Additional Assessments:    Clinical Actions:   Time of IV disconnect: 1226   Attempted Aspiration Amount (ml): <1 ml   Heat/Cold Pack Initiated: Yes  If indicated, compress type: Cold Compress  Antidote Administered per Provider Order: Not applicable  If indicated, name of antidote administered:    Provider Notification and Instructions:   Provider Notified: Mallie Combes, PA-C and Cassie Heilingoetter, PA-C  Time of Provider Notification: 1235  Provider-Specific Instructions: Apply cold compress, disconnect from IV   Patient Follow-Up Instructions and Return Appointments:  Follow-Up Instructions: Pt to come in Monday and Friday for follow ups with [facility].    Discharge Checklist:      Extravasation Patient Teaching Instruction Sheet provided to patient: Yes   Appointments scheduled (24 hour, 48 hour, and 1 week follow-ups): Yes     Additional Information about the Extravasation/Infiltration Event:

## 2024-07-23 NOTE — Patient Instructions (Addendum)
 Extravasation/Infiltration Patient Teaching Instructions  1.) Apply Cold Compress over the affected area for 15-20 minutes, four times daily as tolerated.  2.) Elevate the affected site for the next 24-48 hours.   3.) Protect the site from direct sunlight.  4.) Return to clinic for evaluation 24 hours, 48 hours, and 1 week after occurrence.  5.) Follow-up appointments are scheduled with registered nurse at clinic to assess the site.   6.) Call the clinic at [phone] with any further questions or problems.   Upcoming Follow-up Appointments:   Appointment 24 hours after occurrence:  Date: 07/26/24 Time: 09:30am  Appointment 48 hours after occurrence:  Date: 07/29/24 Time: 09:00am  You have an appointment to get a port at 11:30am on 08/06/24. No food midnight, clear liquids until 7am. You will need a  driver & 24 hours of supervision.    [facility] - A DEPT OF [place] [facility]  Discharge Instructions: Thank you for choosing [facility] to provide your oncology and hematology care.   If you have a lab appointment with the Cancer Center, please go directly to the Cancer Center and check in at the registration area.   Wear comfortable clothing and clothing appropriate for easy access to any Portacath or PICC line.   We strive to give you quality time with your provider. You may need to reschedule your appointment if you arrive late (15 or more minutes).  Arriving late affects you and other patients whose appointments are after yours.  Also, if you miss three or more appointments without notifying the office, you may be dismissed from the clinic at the provider's discretion.      For prescription refill requests, have your pharmacy contact our office and allow 72 hours for refills to be completed.    Today you received the following chemotherapy and/or immunotherapy agents gazyva       To help prevent nausea and vomiting after your  treatment, we encourage you to take your nausea medication as directed.  BELOW ARE SYMPTOMS THAT SHOULD BE REPORTED IMMEDIATELY: *FEVER GREATER THAN 100.4 F (38 C) OR HIGHER *CHILLS OR SWEATING *NAUSEA AND VOMITING THAT IS NOT CONTROLLED WITH YOUR NAUSEA MEDICATION *UNUSUAL SHORTNESS OF BREATH *UNUSUAL BRUISING OR BLEEDING *URINARY PROBLEMS (pain or burning when urinating, or frequent urination) *BOWEL PROBLEMS (unusual diarrhea, constipation, pain near the anus) TENDERNESS IN MOUTH AND THROAT WITH OR WITHOUT PRESENCE OF ULCERS (sore throat, sores in mouth, or a toothache) UNUSUAL RASH, SWELLING OR PAIN  UNUSUAL VAGINAL DISCHARGE OR ITCHING   Items with * indicate a potential emergency and should be followed up as soon as possible or go to the Emergency Department if any problems should occur.  Please show the CHEMOTHERAPY ALERT CARD or IMMUNOTHERAPY ALERT CARD at check-in to the Emergency Department and triage nurse.  Should you have questions after your visit or need to cancel or reschedule your appointment, please contact [facility] - A DEPT OF [facility]  Dept: [phone]  and follow the prompts.  Office hours are 8:00 a.m. to 4:30 p.m. Monday - Friday. Please note that voicemails left after 4:00 p.m. may not be returned until the following business day.  We are closed weekends and major holidays. You have access to a nurse at all times for urgent questions. Please call the main number to the clinic Dept: [phone] and follow the prompts.   For any non-urgent questions, you may  also contact your provider using MyChart. We now offer e-Visits for anyone 44 and older to request care online for non-urgent symptoms. For details visit [email or web address].   Also download the MyChart app! Go to the app store, search MyChart, open the app, select [place], and log in with your MyChart username and password.

## 2024-07-26 ENCOUNTER — Other Ambulatory Visit: Payer: Self-pay

## 2024-07-26 ENCOUNTER — Other Ambulatory Visit (HOSPITAL_COMMUNITY): Payer: Self-pay

## 2024-07-26 ENCOUNTER — Inpatient Hospital Stay

## 2024-07-26 ENCOUNTER — Encounter: Payer: Self-pay | Admitting: Internal Medicine

## 2024-07-26 NOTE — Patient Instructions (Signed)
 Extravasation/Infiltration Patient Teaching Instructions  1.) Apply Cold Compress over the affected area for 15-20 minutes, four times daily as tolerated.  2.) Elevate the affected site for the next 24-48 hours.   3.) Protect the site from direct sunlight.  4.) Return to clinic for evaluation 24 hours, 48 hours, and 1 week after occurrence.  5.) Follow-up appointments are scheduled with registered nurse at clinic to assess the site.   6.) Call the clinic at [phone] with any further questions or problems.   Upcoming Follow-up Appointments:    Appointment 1 week after occurrence:    Date: 07/29/2024  Time: 0900 AM

## 2024-07-26 NOTE — Progress Notes (Signed)
 Extravasation and Infiltration Follow-Up Assessment Note:     Follow-Up Appointment (48-Hours After Occurrence)   Date: 07/26/24  Time: 0930  Name of RN Completing Assessment: Elenor Almarie Blush  Clinical Assessment:  Site Dimensions (cm): 4 cm x 3.5 cm  Site Color: Grade 1 - pink  Site Integrity: Grade 0 - Unbroken  Additional Assessments: Upon assessment site appeared to be pink in the middle with bruising around the edges and slightly edematous (non pitting and < 1 inch in all directions). Site was blanchable and cool to touch. Pt stated It looks no different than it did on Friday. This RN had RN from Friday assess site as well, this RN stated site appears the same, but now has bruising present. Pt had no complaints of discomfort or tenderness at site.   Plan of Care:   Pt was educated to apply cool compresses in 20 minute increments up to 4 times a day and to keep it protected from direct light. Pt verbalized understanding and provided teach back to this RN. Pt was made aware of last extravasation check on Thursday after lab appt. Pt was agreeable with plan.    Follow-Up Care Reviewed with Patient: yes  Upcoming Follow-Up Appointments Reviewed with Patient: yes

## 2024-07-27 ENCOUNTER — Other Ambulatory Visit (HOSPITAL_COMMUNITY): Payer: Self-pay

## 2024-07-27 ENCOUNTER — Other Ambulatory Visit: Payer: Self-pay

## 2024-07-27 NOTE — Progress Notes (Signed)
 Specialty Pharmacy Refill Coordination Note  Aaron Gonzalez is a 78 y.o. male contacted today regarding refills of specialty medication(s) Venetoclax  (VENCLEXTA )   Patient requested Delivery   Delivery date: 07/29/24   Verified address: [address]   [place] [address]   Medication will be filled on 07/28/24. Medication is on order.

## 2024-07-28 ENCOUNTER — Other Ambulatory Visit (HOSPITAL_COMMUNITY): Payer: Self-pay

## 2024-07-28 ENCOUNTER — Other Ambulatory Visit: Payer: Self-pay

## 2024-07-29 ENCOUNTER — Inpatient Hospital Stay

## 2024-07-29 ENCOUNTER — Encounter: Payer: Self-pay | Admitting: Internal Medicine

## 2024-07-29 DIAGNOSIS — Z5111 Encounter for antineoplastic chemotherapy: Secondary | ICD-10-CM | POA: Diagnosis not present

## 2024-07-29 DIAGNOSIS — C9110 Chronic lymphocytic leukemia of B-cell type not having achieved remission: Secondary | ICD-10-CM

## 2024-07-29 LAB — CBC WITH DIFFERENTIAL (CANCER CENTER ONLY)
Basophils Absolute: 0 K/uL (ref 0.0–0.1)
Basophils Relative: 1 %
Eosinophils Absolute: 0 K/uL (ref 0.0–0.5)
Eosinophils Relative: 2 %
HCT: 31.4 % — ABNORMAL LOW (ref 39.0–52.0)
Hemoglobin: 10.6 g/dL — ABNORMAL LOW (ref 13.0–17.0)
Lymphocytes Relative: 57 %
Lymphs Abs: 1.4 K/uL (ref 0.7–4.0)
MCH: 33.1 pg (ref 26.0–34.0)
MCHC: 33.8 g/dL (ref 30.0–36.0)
MCV: 98.1 fL (ref 80.0–100.0)
Monocytes Absolute: 0.2 K/uL (ref 0.1–1.0)
Monocytes Relative: 8 %
Neutro Abs: 0.8 K/uL — ABNORMAL LOW (ref 1.7–7.7)
Neutrophils Relative %: 32 %
Platelet Count: 56 K/uL — ABNORMAL LOW (ref 150–400)
RBC: 3.2 MIL/uL — ABNORMAL LOW (ref 4.22–5.81)
RDW: 19.3 % — ABNORMAL HIGH (ref 11.5–15.5)
Smear Review: NORMAL
WBC Count: 2.4 K/uL — ABNORMAL LOW (ref 4.0–10.5)
nRBC: 0 % (ref 0.0–0.2)

## 2024-07-29 LAB — CMP (CANCER CENTER ONLY)
ALT: 12 U/L (ref 0–44)
AST: 55 U/L — ABNORMAL HIGH (ref 15–41)
Albumin: 3.8 g/dL (ref 3.5–5.0)
Alkaline Phosphatase: 52 U/L (ref 38–126)
Anion gap: 10 (ref 5–15)
BUN: 14 mg/dL (ref 8–23)
CO2: 24 mmol/L (ref 22–32)
Calcium: 8.9 mg/dL (ref 8.9–10.3)
Chloride: 103 mmol/L (ref 98–111)
Creatinine: 0.84 mg/dL (ref 0.61–1.24)
GFR, Estimated: >60 mL/min (ref >60)
Glucose, Bld: 80 mg/dL (ref 70–99)
Potassium: 4 mmol/L (ref 3.5–5.1)
Sodium: 137 mmol/L (ref 135–145)
Total Bilirubin: 2 mg/dL — ABNORMAL HIGH (ref 0.0–1.2)
Total Protein: 5.9 g/dL — ABNORMAL LOW (ref 6.5–8.1)

## 2024-07-29 LAB — LACTATE DEHYDROGENASE: LDH: 1612 U/L — ABNORMAL HIGH (ref 98–192)

## 2024-07-29 LAB — URIC ACID: Uric Acid, Serum: 6.7 mg/dL (ref 3.7–8.6)

## 2024-07-29 NOTE — Progress Notes (Signed)
 Extravasation and Infiltration Follow-Up Assessment Note:     Follow-Up Appointment (1-Week After Occurrence)   Date: 07/29/24  Time: 0850  Name of RN Completing Assessment: Elenor Almarie Blush  Clinical Assessment:  Site Dimensions (cm): 0 cm  Site Color: Grade 0 - normal   Site Integrity: Grade 0 - Unbroken  Additional Assessments: Site around the extravasation is bruised, but blanchable. Pt had no c/o discomfort. No warmth or swelling noted upon assessment.   Plan of Care:   Pt to continue to monitor site at home and notify provider if any changes occur. Pt agreeable with plan and verbalized understanding.    Follow-Up Care Reviewed with Patient: not applicable  Upcoming Follow-Up Appointments Reviewed with Patient: not applicable

## 2024-07-30 ENCOUNTER — Other Ambulatory Visit: Payer: Self-pay

## 2024-07-30 ENCOUNTER — Encounter

## 2024-08-02 ENCOUNTER — Other Ambulatory Visit: Payer: Self-pay

## 2024-08-03 ENCOUNTER — Other Ambulatory Visit (HOSPITAL_COMMUNITY): Payer: Self-pay

## 2024-08-03 ENCOUNTER — Other Ambulatory Visit: Payer: Self-pay

## 2024-08-03 NOTE — Progress Notes (Deleted)
 [facility] OFFICE PROGRESS NOTE  [name], MD [address] [place] [address]  DIAGNOSIS:  1) History of rectal cancer in 2007 status post concurrent chemoradiation followed by surgery. 2) Chronic lymphocytic leukemia diagnosed in 2007.   PRIOR THERAPY: 1) Ibrutinib  420 mg p.o. daily first 2007 and discontinued August 26, 2023 secondary to disease progression. 2) Acalabrutinib  100 mg p.o. twice daily.  First dose started August 28, 2023.  This discontinued and August 2025 secondary to disease progression and rising total white blood count with anemia and thrombocytopenia.  CURRENT THERAPY:  Infusional obinutuzumab  in addition to venetoclax .  First dose on 06/25/24. Venetoclax  is expected to start on 07/15/24   INTERVAL HISTORY: BIJON MINEER 78 y.o. male returns to the clinic today for a follow-up visit.  The patient was last seen in clinic by Dr. Sherrod on 07/22/24.   Patient was started on treatment for progressive CLL the week on 06/25/2024.  He is undergoing treatment with obinutuzumab .  He was hospitalized for pancytopenia and dehydration in mid September 2025.  He has been feeling well since that time.  He also started on treatment with venetoclax  on 07/15/2024.  Thus far he has been tolerating it ***.   He denies any abnormal bleeding or bruising. He denies any signs and symptoms of infection including fever, nasal congestion, sore throat, skin infections, burning with urination, dysuria, diarrhea, or abdominal pain. He denies any shortness of breath, night sweats, chest pain, unexplained weight loss, or lymphadenopathy.   He has a history of leg swelling, which is typical for him. The swelling improves with leg elevation but recurs upon standing. He states it is baseline today. He is here today for evaluation and repeat blood work before undergoing day 1 cycle #3 tomorrow as scheduled.    Pancyotpnie and dehydration.    MEDICAL HISTORY: Past Medical  History:  Diagnosis Date   Arthritis    CLL (chronic lymphocytic leukemia) (HCC)    DVT (deep venous thrombosis) (HCC)    History of rectal cancer    Hypertension    Stroke (HCC)     ALLERGIES:  is allergic to gazyva  [obinutuzumab ].  MEDICATIONS:  Current Outpatient Medications  Medication Sig Dispense Refill   acetaminophen  (TYLENOL ) 325 MG tablet Take 2 tablets (650 mg total) by mouth every 6 (six) hours as needed for mild pain (pain score 1-3) or fever (or Fever >/= 101). 20 tablet 0   acyclovir  (ZOVIRAX ) 400 MG tablet Take 1 tablet (400 mg total) by mouth daily. 30 tablet 5   allopurinol  (ZYLOPRIM ) 300 MG tablet Take 1 tablet (300 mg total) by mouth daily. 30 tablet 0   amLODipine  (NORVASC ) 10 MG tablet Take 1 tablet by mouth daily.     blood glucose meter kit and supplies KIT Dispense based on patient and insurance preference. Use up to four times daily as directed. 1 each 0   carvedilol  (COREG ) 6.25 MG tablet TAKE 1 TABLET(6.25 MG) BY MOUTH TWICE DAILY 180 tablet 3   lidocaine -prilocaine  (EMLA ) cream Apply 1 Application topically as needed. 30 g 2   ondansetron  (ZOFRAN ) 8 MG tablet Take 1 tablet (8 mg total) by mouth every 8 (eight) hours as needed for nausea or vomiting. 30 tablet 1   prochlorperazine  (COMPAZINE ) 10 MG tablet Take 1 tablet (10 mg total) by mouth every 6 (six) hours as needed for nausea or vomiting. 30 tablet 1   senna-docusate (SENOKOT-S) 8.6-50 MG tablet Take 1 tablet by  mouth at bedtime as needed for mild constipation. 30 tablet 0   venetoclax  (VENCLEXTA ) 10 MG tablet Take 1 tablet (10 mg total) by mouth daily for first week of treatment, then increase to 2 tablets (20 mg total) by mouth daily for second week of treatment.Take with food and water . Take as instructed per MD. Start: 07/15/24 28 tablet 0   venetoclax  (VENCLEXTA ) 100 MG tablet Take 1 tablet (100 mg total) by mouth daily for fourth week of treatment. Take with food and water . Start 08/05/24 7 tablet 0    venetoclax  (VENCLEXTA ) 50 MG tablet Take 1 tablet (50 mg total) by mouth daily for third week of treatment. Take with food and water . Start 07/29/24 7 tablet 0   No current facility-administered medications for this visit.    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   Procedure: Left TOTAL HIP ARTHROPLASTY ANTERIOR APPROACH;  Surgeon: Vernetta Lonni GRADE, MD;  Location: [facility];  Service: Orthopedics;  Laterality: Left;  RNFA    REVIEW OF SYSTEMS:   Review of Systems  Constitutional: Negative for appetite change, chills, fatigue, fever and unexpected weight change.  HENT:   Negative for mouth sores, nosebleeds, sore throat and trouble swallowing.   Eyes: Negative for eye problems and icterus.  Respiratory: Negative for cough, hemoptysis, shortness of breath and wheezing.   Cardiovascular: Negative for chest pain and leg swelling.  Gastrointestinal: Negative for abdominal pain, constipation, diarrhea, nausea and vomiting.  Genitourinary: Negative for bladder incontinence, difficulty urinating, dysuria, frequency and hematuria.   Musculoskeletal: Negative for back pain, gait problem, neck pain and neck stiffness.  Skin: Negative for itching and rash.  Neurological: Negative for dizziness, extremity weakness, gait problem, headaches, light-headedness and seizures.  Hematological: Negative for adenopathy. Does not bruise/bleed easily.  Psychiatric/Behavioral: Negative for confusion, depression and sleep disturbance. The patient is not nervous/anxious.     PHYSICAL EXAMINATION:  There were no vitals taken for this visit.  ECOG PERFORMANCE STATUS: {CHL ONC ECOG [id number]  Physical Exam  Constitutional: Oriented to person, place, and time and well-developed, well-nourished, and in no distress. No distress.  HENT:  Head: Normocephalic and atraumatic.  Mouth/Throat: Oropharynx is  clear and moist. No oropharyngeal exudate.  Eyes: Conjunctivae are normal. Right eye exhibits no discharge. Left eye exhibits no discharge. No scleral icterus.  Neck: Normal range of motion. Neck supple.  Cardiovascular: Normal rate, regular rhythm, normal heart sounds and intact distal pulses.   Pulmonary/Chest: Effort normal and breath sounds normal. No respiratory distress. No wheezes. No rales.  Abdominal: Soft. Bowel sounds are normal. Exhibits no distension and no mass. There is no tenderness.  Musculoskeletal: Normal range of motion. Exhibits no edema.  Lymphadenopathy:    No cervical adenopathy.  Neurological: Alert and oriented to person, place, and time. Exhibits normal muscle tone. Gait normal. Coordination normal.  Skin: Skin is warm and dry. No rash noted. Not diaphoretic. No erythema. No pallor.  Psychiatric: Mood, memory and judgment normal.  Vitals reviewed.  LABORATORY DATA: Lab Results  Component Value Date   WBC 2.4 (L) 07/29/2024   HGB 10.6 (L) 07/29/2024   HCT 31.4 (L) 07/29/2024   MCV 98.1 07/29/2024   PLT 56 (L) 07/29/2024      Chemistry      Component Value Date/Time   NA 137 07/29/2024 0819   NA 139 06/28/2021 0954   K 4.0  07/29/2024 0819   CL 103 07/29/2024 0819   CO2 24 07/29/2024 0819   BUN 14 07/29/2024 0819   BUN 11 06/28/2021 0954   CREATININE 0.84 07/29/2024 0819      Component Value Date/Time   CALCIUM  8.9 07/29/2024 0819   ALKPHOS 52 07/29/2024 0819   AST 55 (H) 07/29/2024 0819   ALT 12 07/29/2024 0819   BILITOT 2.0 (H) 07/29/2024 0819       RADIOGRAPHIC STUDIES:  CT HEAD WO CONTRAST Result Date: 07/06/2024 EXAM: CT HEAD WITHOUT CONTRAST 07/06/2024 06:15:00 AM TECHNIQUE: CT of the head was performed without the administration of intravenous contrast. Automated exposure control, iterative reconstruction, and/or weight based adjustment of the mA/kV was utilized to reduce the radiation dose to as low as reasonably achievable. COMPARISON:  CT of the head dated 06/14/2021. CLINICAL HISTORY: Mental status change, unknown cause. Per pt's son he is confused. Code sepsis. Pt currently receiving chemo. FINDINGS: BRAIN AND VENTRICLES: No acute hemorrhage. No evidence of acute infarct. No hydrocephalus. No extra-axial collection. No mass effect or midline shift. Age-related atrophy and mild periventricular white matter disease. Mineralization present posterolaterally within the right thalamus related to remote infarct. ORBITS: No acute abnormality. SINUSES: No acute abnormality. SOFT TISSUES AND SKULL: No acute soft tissue abnormality. No skull fracture. Calcifications within the carotid siphons. IMPRESSION: 1. No acute intracranial abnormality. 2. Age-related atrophy and mild periventricular white matter disease. 3. 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 Electronically signed by: Evalene Coho MD 07/06/2024 06:29 AM EDT RP Workstation: [id number]   CT CHEST ABDOMEN PELVIS W CONTRAST Result Date: 07/05/2024 CLINICAL DATA:  Sepsis Febrile. Pt recently underwent surgery for rectal cancer. Currently receiving Chemo. Pt denies any pain EXAM: CT CHEST, ABDOMEN, AND PELVIS WITH CONTRAST TECHNIQUE: Multidetector CT imaging of the chest, abdomen and pelvis was performed following the standard protocol during bolus administration of intravenous contrast. RADIATION DOSE REDUCTION: This exam was performed according to the departmental dose-optimization program which includes automated exposure control, adjustment of the mA and/or kV according to patient size and/or use of iterative reconstruction technique. CONTRAST:  OMNIPAQUE  IOHEXOL  300 MG/ML  SOLN COMPARISON:  CT chest abdomen pelvis 01/06/2024 FINDINGS: CT CHEST FINDINGS Cardiovascular: Normal heart size. No significant pericardial effusion. The thoracic aorta is normal in caliber. Aberrant right subclavian artery. Mild atherosclerotic plaque of the thoracic aorta. Left anterior  descending coronary artery calcifications. Mediastinum/Nodes: No enlarged mediastinal, hilar, or axillary lymph nodes. Thyroid gland, trachea, and esophagus demonstrate no significant findings. Tiny hiatal hernia. Lungs/Pleura: No focal consolidation. No pulmonary nodule. No pulmonary mass. No pleural effusion. No pneumothorax. Musculoskeletal: No chest wall abnormality. No suspicious lytic or blastic osseous lesions. No acute displaced fracture. Multilevel degenerative changes of the spine. CT ABDOMEN PELVIS FINDINGS Hepatobiliary: The liver is enlarged measuring up to 23 cm. No focal liver abnormality. No gallstones, gallbladder wall thickening, or pericholecystic fluid. No biliary dilatation. Pancreas: No focal lesion. Normal pancreatic contour. No surrounding inflammatory changes. No main pancreatic ductal dilatation. Spleen: The spleen is enlarged measuring up to 14 cm. No focal splenic lesion. Adrenals/Urinary Tract: No adrenal nodule bilaterally. Bilateral kidneys enhance symmetrically. Fluid density lesions of the kidneys likely represent simple renal cysts. Simple renal cysts, in the absence of clinically indicated signs/symptoms, require no independent follow-up. No hydronephrosis. No hydroureter. Punctate right nephrolithiasis. No left nephrolithiasis. No ureterolithiasis bilaterally. The urinary bladder is not well visualized due to streak artifact originating from bilateral femoral surgical hardware. Stomach/Bowel: Left lower quadrant end  colostomy. Stomach is within normal limits. No evidence of bowel wall thickening or dilatation. Vascular/Lymphatic: No abdominal aorta or iliac aneurysm. Mild atherosclerotic plaque of the aorta and its branches. No abdominal, pelvic, or inguinal lymphadenopathy. Reproductive: Not visualized due to streak artifact originating from bilateral femoral surgical hardware. Other: No intraperitoneal free fluid. No intraperitoneal free gas. No organized fluid collection.  Musculoskeletal: No abdominal wall hernia or abnormality. No suspicious lytic or blastic osseous lesions. No acute displaced fracture. Multilevel degenerative changes of the spine. Total bilateral hip arthroplasty. IMPRESSION: 1. No acute intrathoracic abnormality. 2. Limited evaluation of the pelvis due to streak artifact originating from bilateral femoral surgical hardware. 3. Hepatosplenomegaly. 4. Nonobstructive punctate right nephrolithiasis. 5.  Aortic Atherosclerosis (ICD10-I70.0). 6. Incidentally noted aberrant right subclavian artery. Electronically Signed   By: Morgane  Naveau M.D.   On: 07/05/2024 23:39   DG Chest Port 1 View Result Date: 07/05/2024 CLINICAL DATA:  Questionable sepsis - evaluate for abnormality EXAM: PORTABLE CHEST 1 VIEW COMPARISON:  Chest x-ray 04/01/2021 FINDINGS: The heart and mediastinal contours are unchanged. Atherosclerotic plaque. No focal consolidation. No pulmonary edema. No pleural effusion. No pneumothorax. No acute osseous abnormality. IMPRESSION: No active disease. Electronically Signed   By: Morgane  Naveau M.D.   On: 07/05/2024 19:38     ASSESSMENT/PLAN:  This is a very pleasant 78 year old African-American male with a history of rectal cancer in 2007. He is status post concurrent chemoradiation followed by surgical resection. At the same time he was diagnosed with chronic lymphocytic leukemia and he has been on observation initially for several years which was started on treatment with ibrutinib  420 mg in 2019.    He was found to have evidence of disease progression in early November 2024. Therefore he was started on acalabrutinib  around 08/28/2023.  This was discontinued in August 2025 due to disease progression.   Therefore Dr. Sherrod started the patient on treatment with venetoclax  and Obinutuzumab .  His first ifusion was on 06/25/24. He started venetoclax  around 07/15/24.     Labs were reviewed. His WBC is ***. His Hbg is ***. His platelet count is ***.    Recommend he *** with day 1 cycle #3 today as schedule.   Peripheral edema - Advise wearing compression stockings during the day. - Advised to elevate   The patient was advised to call immediately if he has any concerning symptoms in the interval. The patient voices understanding of current disease status and treatment options and is in agreement with the current care plan. All questions were answered. The patient knows to call the clinic with any problems, questions or concerns. We can certainly see the patient much sooner if necessary  No orders of the defined types were placed in this encounter.    I spent {CHL ONC TIME VISIT - DTPQU:[id number]} counseling the patient face to face. The total time spent in the appointment was {CHL ONC TIME VISIT - DTPQU:[id number]}.  Alexismarie Flaim L Jaycelynn Knickerbocker, PA-C 08/03/24

## 2024-08-04 ENCOUNTER — Other Ambulatory Visit: Payer: Self-pay

## 2024-08-04 NOTE — Progress Notes (Signed)
 Specialty Pharmacy Ongoing Clinical Assessment Note  Aaron Gonzalez is a 78 y.o. male who is being followed by the specialty pharmacy service for RxSp Oncology   Patient's specialty medication(s) reviewed today: Venetoclax  (VENCLEXTA )   Missed doses in the last 4 weeks: 0   Patient/Caregiver did not have any additional questions or concerns.   Therapeutic benefit summary: Patient is achieving benefit   Adverse events/side effects summary: No adverse events/side effects   Patient's therapy is appropriate to: Continue    Goals Addressed             This Visit's Progress    Slow Disease Progression   On track    Patient is on track. Patient will maintain adherence. Per OV from 10/2, pt had significant improvement in blood counts         Follow up: 3 months  [facility]

## 2024-08-04 NOTE — Progress Notes (Signed)
 Specialty Pharmacy Refill Coordination Note  Aaron Gonzalez is a 78 y.o. male contacted today regarding refills of specialty medication(s) Venetoclax  (VENCLEXTA )   Patient requested Delivery   Delivery date: 08/05/24   Verified address: [address]   [place] [id number]   Medication will be filled on 08/04/24.

## 2024-08-05 ENCOUNTER — Inpatient Hospital Stay

## 2024-08-05 ENCOUNTER — Other Ambulatory Visit: Payer: Self-pay

## 2024-08-05 ENCOUNTER — Inpatient Hospital Stay: Admitting: Physician Assistant

## 2024-08-05 ENCOUNTER — Encounter: Payer: Self-pay | Admitting: Internal Medicine

## 2024-08-05 ENCOUNTER — Other Ambulatory Visit (HOSPITAL_COMMUNITY): Payer: Self-pay

## 2024-08-05 ENCOUNTER — Other Ambulatory Visit: Payer: Self-pay | Admitting: Student

## 2024-08-05 VITALS — BP 128/77 | HR 71 | Temp 98.2°F | Resp 16 | Wt 147.0 lb

## 2024-08-05 DIAGNOSIS — C9110 Chronic lymphocytic leukemia of B-cell type not having achieved remission: Secondary | ICD-10-CM

## 2024-08-05 DIAGNOSIS — Z5111 Encounter for antineoplastic chemotherapy: Secondary | ICD-10-CM | POA: Diagnosis not present

## 2024-08-05 LAB — CMP (CANCER CENTER ONLY)
ALT: 9 U/L (ref 0–44)
ALT: 9 U/L (ref 0–44)
AST: 24 U/L (ref 15–41)
AST: 37 U/L (ref 15–41)
Albumin: 3.9 g/dL (ref 3.5–5.0)
Albumin: 4 g/dL (ref 3.5–5.0)
Alkaline Phosphatase: 52 U/L (ref 38–126)
Alkaline Phosphatase: 56 U/L (ref 38–126)
Anion gap: 3 — ABNORMAL LOW (ref 5–15)
Anion gap: 12 (ref 5–15)
BUN: 19 mg/dL (ref 8–23)
BUN: 17 mg/dL (ref 8–23)
CO2: 31 mmol/L (ref 22–32)
CO2: 24 mmol/L (ref 22–32)
Calcium: 9.2 mg/dL (ref 8.9–10.3)
Calcium: 9.2 mg/dL (ref 8.9–10.3)
Chloride: 105 mmol/L (ref 98–111)
Chloride: 104 mmol/L (ref 98–111)
Creatinine: 0.8 mg/dL (ref 0.61–1.24)
Creatinine: 0.94 mg/dL (ref 0.61–1.24)
GFR, Estimated: >60 mL/min (ref >60)
GFR, Estimated: >60 mL/min (ref >60)
Glucose, Bld: 75 mg/dL (ref 70–99)
Glucose, Bld: 77 mg/dL (ref 70–99)
Potassium: 4.3 mmol/L (ref 3.5–5.1)
Potassium: 4.4 mmol/L (ref 3.5–5.1)
Sodium: 139 mmol/L (ref 135–145)
Sodium: 139 mmol/L (ref 135–145)
Total Bilirubin: 1.5 mg/dL — ABNORMAL HIGH (ref 0.0–1.2)
Total Bilirubin: 1.2 mg/dL (ref 0.0–1.2)
Total Protein: 5.8 g/dL — ABNORMAL LOW (ref 6.5–8.1)
Total Protein: 6 g/dL — ABNORMAL LOW (ref 6.5–8.1)

## 2024-08-05 LAB — CBC WITH DIFFERENTIAL (CANCER CENTER ONLY)
Abs Immature Granulocytes: 0.01 K/uL (ref 0.00–0.07)
Basophils Absolute: 0.1 K/uL (ref 0.0–0.1)
Basophils Relative: 1 %
Eosinophils Absolute: 0.1 K/uL (ref 0.0–0.5)
Eosinophils Relative: 3 %
HCT: 32 % — ABNORMAL LOW (ref 39.0–52.0)
Hemoglobin: 10.7 g/dL — ABNORMAL LOW (ref 13.0–17.0)
Immature Granulocytes: 0 %
Lymphocytes Relative: 36 %
Lymphs Abs: 1.6 K/uL (ref 0.7–4.0)
MCH: 33.2 pg (ref 26.0–34.0)
MCHC: 33.4 g/dL (ref 30.0–36.0)
MCV: 99.4 fL (ref 80.0–100.0)
Monocytes Absolute: 0.4 K/uL (ref 0.1–1.0)
Monocytes Relative: 9 %
Neutro Abs: 2.2 K/uL (ref 1.7–7.7)
Neutrophils Relative %: 51 %
Platelet Count: 106 K/uL — ABNORMAL LOW (ref 150–400)
RBC: 3.22 MIL/uL — ABNORMAL LOW (ref 4.22–5.81)
RDW: 19 % — ABNORMAL HIGH (ref 11.5–15.5)
WBC Count: 4.3 K/uL (ref 4.0–10.5)
nRBC: 0 % (ref 0.0–0.2)

## 2024-08-05 LAB — URIC ACID: Uric Acid, Serum: 6.5 mg/dL (ref 3.7–8.6)

## 2024-08-05 LAB — LACTATE DEHYDROGENASE: LDH: 979 U/L — ABNORMAL HIGH (ref 98–192)

## 2024-08-05 NOTE — Progress Notes (Signed)
 [facility] OFFICE PROGRESS NOTE  [name], MD [address] [place] [address]  DIAGNOSIS:  1) History of rectal cancer in 2007 status post concurrent chemoradiation followed by surgery. 2) Chronic lymphocytic leukemia diagnosed in 2007.   PRIOR THERAPY: 1) Ibrutinib  420 mg p.o. daily first 2007 and discontinued August 26, 2023 secondary to disease progression. 2) Acalabrutinib  100 mg p.o. twice daily.  First dose started August 28, 2023.  This discontinued and August 2025 secondary to disease progression and rising total white blood count with anemia and thrombocytopenia.  CURRENT THERAPY:  Infusional obinutuzumab  in addition to venetoclax .  First dose on 06/25/24. Venetoclax  is expected to start on 07/15/24   INTERVAL HISTORY: Aaron Gonzalez 78 y.o. male returns to the clinic today for a follow-up visit.  The patient was last seen in clinic by Dr. Sherrod on 07/22/24.   The patient was started on treatment for progressive CLL the week on 06/25/2024.  He is undergoing treatment with obinutuzumab .  He was hospitalized for pancytopenia and dehydration in mid September 2025.  He has been feeling well since that time.  He also started on treatment with venetoclax  on 07/15/2024.  Thus far he has been tolerating it without adverse side effects.   He denies any abnormal bleeding or bruising. He denies any signs and symptoms of infection including fever, nasal congestion, sore throat, skin infections, burning with urination, dysuria, diarrhea, or abdominal pain. He denies any shortness of breath, night sweats, chest pain,  or lymphadenopathy. He lost weight but is eating well. However, he typically has swelling and did have some reduction in his swelling due to adjustments in his medications by his PCP per patient report.   He is here today for evaluation and repeat blood work.     MEDICAL HISTORY: Past Medical History:  Diagnosis Date   Arthritis    CLL (chronic  lymphocytic leukemia) (HCC)    DVT (deep venous thrombosis) (HCC)    History of rectal cancer    Hypertension    Stroke (HCC)     ALLERGIES:  is allergic to gazyva  [obinutuzumab ].  MEDICATIONS:  Current Outpatient Medications  Medication Sig Dispense Refill   acetaminophen  (TYLENOL ) 325 MG tablet Take 2 tablets (650 mg total) by mouth every 6 (six) hours as needed for mild pain (pain score 1-3) or fever (or Fever >/= 101). 20 tablet 0   acyclovir  (ZOVIRAX ) 400 MG tablet Take 1 tablet (400 mg total) by mouth daily. 30 tablet 5   allopurinol  (ZYLOPRIM ) 300 MG tablet Take 1 tablet (300 mg total) by mouth daily. 30 tablet 0   amLODipine  (NORVASC ) 10 MG tablet Take 1 tablet by mouth daily.     blood glucose meter kit and supplies KIT Dispense based on patient and insurance preference. Use up to four times daily as directed. 1 each 0   carvedilol  (COREG ) 6.25 MG tablet TAKE 1 TABLET(6.25 MG) BY MOUTH TWICE DAILY 180 tablet 3   furosemide  (LASIX ) 20 MG tablet Take 20 mg by mouth daily as needed.     lidocaine -prilocaine  (EMLA ) cream Apply 1 Application topically as needed. 30 g 2   ondansetron  (ZOFRAN ) 8 MG tablet Take 1 tablet (8 mg total) by mouth every 8 (eight) hours as needed for nausea or vomiting. 30 tablet 1   prochlorperazine  (COMPAZINE ) 10 MG tablet Take 1 tablet (10 mg total) by mouth every 6 (six) hours as needed for nausea or vomiting. 30 tablet 1  senna-docusate (SENOKOT-S) 8.6-50 MG tablet Take 1 tablet by mouth at bedtime as needed for mild constipation. 30 tablet 0   venetoclax  (VENCLEXTA ) 10 MG tablet Take 1 tablet (10 mg total) by mouth daily for first week of treatment, then increase to 2 tablets (20 mg total) by mouth daily for second week of treatment.Take with food and water . Take as instructed per MD. Start: 07/15/24 28 tablet 0   venetoclax  (VENCLEXTA ) 100 MG tablet Take 1 tablet (100 mg total) by mouth daily for fourth week of treatment. Take with food and water . Start  08/05/24 7 tablet 0   venetoclax  (VENCLEXTA ) 50 MG tablet Take 1 tablet (50 mg total) by mouth daily for third week of treatment. Take with food and water . Start 07/29/24 7 tablet 0   No current facility-administered medications for this visit.    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   Procedure: Left TOTAL HIP ARTHROPLASTY ANTERIOR APPROACH;  Surgeon: Vernetta Lonni GRADE, MD;  Location: [facility];  Service: Orthopedics;  Laterality: Left;  RNFA    REVIEW OF SYSTEMS:   Review of Systems  Constitutional: Negative for appetite change, chills, fatigue, fever and unexpected weight change.  HENT: Negative for mouth sores, nosebleeds, sore throat and trouble swallowing.   Eyes: Negative for eye problems and icterus.  Respiratory: Negative for cough, hemoptysis, shortness of breath and wheezing.   Cardiovascular: Negative for chest pain. Positive for leg swelling bilaterally.  Gastrointestinal: Negative for abdominal pain, constipation, diarrhea, nausea and vomiting.  Genitourinary: Negative for bladder incontinence, difficulty urinating, dysuria, frequency and hematuria.   Musculoskeletal: Negative for back pain, gait problem, neck pain and neck stiffness.  Skin: Negative for itching and rash.  Neurological: Negative for dizziness, extremity weakness, gait problem, headaches, light-headedness and seizures.  Hematological: Negative for adenopathy. Does not bruise/bleed easily.  Psychiatric/Behavioral: Negative for confusion, depression and sleep disturbance. The patient is not nervous/anxious.     PHYSICAL EXAMINATION:  Blood pressure 128/77, pulse 71, temperature 98.2 F (36.8 C), temperature source Temporal, resp. rate 16, weight 147 lb (66.7 kg), SpO2 97%.  ECOG PERFORMANCE STATUS: 1  Physical Exam  Constitutional: Oriented to person, place, and time and well-developed,  well-nourished, and in no distress.  HENT:  Head: Normocephalic and atraumatic.  Mouth/Throat: Oropharynx is clear and moist. No oropharyngeal exudate.  Eyes: Conjunctivae are normal. Right eye exhibits no discharge. Left eye exhibits no discharge. No scleral icterus.  Neck: Normal range of motion. Neck supple.  Cardiovascular: Normal rate, regular rhythm, normal heart sounds and intact distal pulses.   Pulmonary/Chest: Effort normal and breath sounds normal. No respiratory distress. No wheezes. No rales.  Abdominal: Soft. Bowel sounds are normal. Exhibits no distension and no mass. There is no tenderness.  Musculoskeletal: Normal range of motion. Leg swelling bilaterally.  Lymphadenopathy:    No cervical adenopathy.  Neurological: Alert and oriented to person, place, and time. Exhibits normal muscle tone. Gait normal. Coordination normal.  Skin: Skin is warm and dry. No rash noted. Not diaphoretic. No erythema. No pallor.  Psychiatric: Mood, memory and judgment normal.  Vitals reviewed.  LABORATORY DATA: Lab Results  Component Value Date   WBC 4.3 08/05/2024   HGB 10.7 (L) 08/05/2024   HCT 32.0 (L) 08/05/2024   MCV 99.4 08/05/2024   PLT 106 (L) 08/05/2024      Chemistry      Component  Value Date/Time   NA 139 08/05/2024 0844   NA 139 06/28/2021 0954   K 4.3 08/05/2024 0844   CL 105 08/05/2024 0844   CO2 31 08/05/2024 0844   BUN 19 08/05/2024 0844   BUN 11 06/28/2021 0954   CREATININE 0.80 08/05/2024 0844      Component Value Date/Time   CALCIUM  9.2 08/05/2024 0844   ALKPHOS 52 08/05/2024 0844   AST 24 08/05/2024 0844   ALT 9 08/05/2024 0844   BILITOT 1.5 (H) 08/05/2024 0844       RADIOGRAPHIC STUDIES:  No results found.    ASSESSMENT/PLAN:  This is a very pleasant 78 year old African-American male with a history of rectal cancer in 2007. He is status post concurrent chemoradiation followed by surgical resection. At the same time he was diagnosed with chronic  lymphocytic leukemia and he has been on observation initially for several years which was started on treatment with ibrutinib  420 mg in 2019.    He was found to have evidence of disease progression in early November 2024. Therefore he was started on acalabrutinib  around 08/28/2023.  This was discontinued in August 2025 due to disease progression.   Therefore Dr. Sherrod started the patient on treatment with venetoclax  and Obinutuzumab .  His first ifusion was on 06/25/24. He started venetoclax  around 07/15/24.     Labs were reviewed. His WBC is 4.3. His Hbg is 10.7. His platelet count is 106.   He will continue on the same treatment at the same dose.   Peripheral edema - Advise wearing compression stockings during the day. - Advised to elevate   Port placement for chemotherapy Scheduled for port placement. Post-procedure care instructions provided. - Proceed with port placement as scheduled. - Use Emla  cream cream for numbing after site has healed and glue has come off. - Use ice pack for numbing if glue is still present at next appointment.  Abnormal weight loss Significant weight loss noted. Possible causes include resolution of swelling and medication effects. Reports good appetite. Will continue to monitor.   The patient was advised to call immediately if he has any concerning symptoms in the interval. The patient voices understanding of current disease status and treatment options and is in agreement with the current care plan. All questions were answered. The patient knows to call the clinic with any problems, questions or concerns. We can certainly see the patient much sooner if necessary  No orders of the defined types were placed in this encounter.    The total time spent in the appointment was 20-29 minutes  Audriella Blakeley L Hagop Mccollam, PA-C 08/05/24

## 2024-08-06 ENCOUNTER — Inpatient Hospital Stay: Admitting: Physician Assistant

## 2024-08-06 ENCOUNTER — Encounter: Payer: Self-pay | Admitting: Physician Assistant

## 2024-08-06 ENCOUNTER — Inpatient Hospital Stay

## 2024-08-06 ENCOUNTER — Ambulatory Visit (HOSPITAL_COMMUNITY)
Admission: RE | Admit: 2024-08-06 | Discharge: 2024-08-06 | Disposition: A | Source: Ambulatory Visit | Attending: Physician Assistant

## 2024-08-06 ENCOUNTER — Ambulatory Visit (HOSPITAL_COMMUNITY)
Admission: RE | Admit: 2024-08-06 | Discharge: 2024-08-06 | Disposition: A | Discharge status: Home / Self Care | Source: Ambulatory Visit | Attending: Physician Assistant | Admitting: Physician Assistant

## 2024-08-06 ENCOUNTER — Other Ambulatory Visit: Payer: Self-pay | Admitting: Physician Assistant

## 2024-08-06 ENCOUNTER — Encounter (HOSPITAL_COMMUNITY): Payer: Self-pay

## 2024-08-06 DIAGNOSIS — Z87891 Personal history of nicotine dependence: Secondary | ICD-10-CM | POA: Diagnosis not present

## 2024-08-06 DIAGNOSIS — Z85048 Personal history of other malignant neoplasm of rectum, rectosigmoid junction, and anus: Secondary | ICD-10-CM | POA: Insufficient documentation

## 2024-08-06 DIAGNOSIS — Z86718 Personal history of other venous thrombosis and embolism: Secondary | ICD-10-CM | POA: Insufficient documentation

## 2024-08-06 DIAGNOSIS — C9110 Chronic lymphocytic leukemia of B-cell type not having achieved remission: Secondary | ICD-10-CM | POA: Diagnosis present

## 2024-08-06 DIAGNOSIS — I10 Essential (primary) hypertension: Secondary | ICD-10-CM | POA: Insufficient documentation

## 2024-08-06 DIAGNOSIS — Z8673 Personal history of transient ischemic attack (TIA), and cerebral infarction without residual deficits: Secondary | ICD-10-CM | POA: Diagnosis not present

## 2024-08-06 DIAGNOSIS — Z9049 Acquired absence of other specified parts of digestive tract: Secondary | ICD-10-CM | POA: Diagnosis not present

## 2024-08-06 DIAGNOSIS — Z933 Colostomy status: Secondary | ICD-10-CM | POA: Insufficient documentation

## 2024-08-06 HISTORY — PX: IR IMAGING GUIDED PORT INSERTION: IMG5740

## 2024-08-06 MED ORDER — LIDOCAINE-EPINEPHRINE 1 %-1:100000 IJ SOLN
INTRAMUSCULAR | Status: AC
  Filled 2024-08-06: qty 1

## 2024-08-06 MED ORDER — FENTANYL CITRATE (PF) 100 MCG/2ML IJ SOLN
INTRAMUSCULAR | Status: AC | PRN
  Administered 2024-08-06 (×2): 50 ug via INTRAVENOUS

## 2024-08-06 MED ORDER — LIDOCAINE-EPINEPHRINE 1 %-1:100000 IJ SOLN
20.0000 mL | Freq: Once | INTRAMUSCULAR | Status: AC
  Administered 2024-08-06: 20 mL via INTRADERMAL

## 2024-08-06 MED ORDER — MIDAZOLAM HCL (PF) 2 MG/2ML IJ SOLN
INTRAMUSCULAR | Status: AC | PRN
  Administered 2024-08-06 (×2): 1 mg via INTRAVENOUS

## 2024-08-06 MED ORDER — SODIUM CHLORIDE 0.9 % IV SOLN: INTRAVENOUS | Status: DC

## 2024-08-06 MED ORDER — MIDAZOLAM HCL 2 MG/2ML IJ SOLN
INTRAMUSCULAR | Status: AC
  Filled 2024-08-06: qty 2

## 2024-08-06 MED ORDER — FENTANYL CITRATE (PF) 100 MCG/2ML IJ SOLN
INTRAMUSCULAR | Status: AC
  Filled 2024-08-06: qty 2

## 2024-08-06 MED ORDER — HEPARIN SOD (PORK) LOCK FLUSH 100 UNIT/ML IV SOLN
INTRAVENOUS | Status: AC
  Filled 2024-08-06: qty 5

## 2024-08-06 MED ORDER — HEPARIN SOD (PORK) LOCK FLUSH 100 UNIT/ML IV SOLN
500.0000 [IU] | Freq: Once | INTRAVENOUS | Status: AC
  Administered 2024-08-06: 500 [IU] via INTRAVENOUS

## 2024-08-06 NOTE — Discharge Instructions (Signed)
 MAY CONTACT INTERVENTIONAL RADIOLOGY CLINIC [phone] WITH ANY QUESTIONS OR CONCERNS  MAY REMOVE DRESSING AND SHOWER TOMORROW

## 2024-08-06 NOTE — Progress Notes (Addendum)
  error

## 2024-08-06 NOTE — H&P (Signed)
 Chief Complaint: Patient was seen in consultation today for CLL  Referring Physician(s): Heilingoetter,Cassandra L  Supervising Physician: Hughes Simmonds  Patient Status: [facility] - Out-pt  History of Present Illness: Aaron Gonzalez is a 78 y.o. 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 He has resumed chemotherapy but now with poor venous access.  He did have a previous Port in place in 2007 at the time of initial treatment which has since been removed.  He desires replacement.   [name] presents to [facility] for [facility] placement.  He has been NPO.  He is familiar with Port and related care from prior placement (at outside hospital).  He does have post procedure care arranged.   Past Medical History:  Diagnosis Date   Arthritis    CLL (chronic lymphocytic leukemia) (HCC)    DVT (deep venous thrombosis) (HCC)    History of rectal cancer    Hypertension    Stroke [facility])     Past Surgical History:  Procedure Laterality Date   COLECTOMY     with colostomy   TOTAL HIP ARTHROPLASTY Left 10/26/2021   Procedure: Left TOTAL HIP ARTHROPLASTY ANTERIOR APPROACH;  Surgeon: Vernetta Lonni GRADE, MD;  Location: [facility];  Service: Orthopedics;  Laterality: Left;  RNFA    Allergies: Gazyva  [obinutuzumab ]  Medications: Prior to Admission medications   Medication Sig Start Date End Date Taking? Authorizing Provider  acetaminophen  (TYLENOL ) 325 MG tablet Take 2 tablets (650 mg total) by mouth every 6 (six) hours as needed for mild pain (pain score 1-3) or fever (or Fever >/= 101). 07/09/24  Yes Sheikh, Omair Latif, DO  amLODipine  (NORVASC ) 10 MG tablet Take 1 tablet by mouth daily.   Yes [provider]  acyclovir  (ZOVIRAX ) 400 MG tablet Take 1 tablet  (400 mg total) by mouth daily. 06/16/24   Sherrod Sherrod, MD  allopurinol  (ZYLOPRIM ) 300 MG tablet Take 1 tablet (300 mg total) by mouth daily. 06/16/24   Sherrod Sherrod, MD  blood glucose meter kit and supplies KIT Dispense based on patient and insurance preference. Use up to four times daily as directed. 04/06/21   Jillian Buttery, MD  carvedilol  (COREG ) 6.25 MG tablet TAKE 1 TABLET(6.25 MG) BY MOUTH TWICE DAILY 07/04/23   [place], Peter M, MD  furosemide  (LASIX ) 20 MG tablet Take 20 mg by mouth daily as needed. 07/15/24   [provider]  lidocaine -prilocaine  (EMLA ) cream Apply 1 Application topically as needed. 07/23/24   Heilingoetter, Cassandra L, PA-C  ondansetron  (ZOFRAN ) 8 MG tablet Take 1 tablet (8 mg total) by mouth every 8 (eight) hours as needed for nausea or vomiting. 06/16/24   Sherrod Sherrod, MD  prochlorperazine  (COMPAZINE ) 10 MG tablet Take 1 tablet (10 mg total) by mouth every 6 (six) hours as needed for nausea or vomiting. 06/16/24   Sherrod Sherrod, MD  senna-docusate (SENOKOT-S) 8.6-50 MG tablet Take 1 tablet by mouth at bedtime as needed for mild constipation. 07/09/24   Sherrill Cable Latif, DO  venetoclax  (VENCLEXTA ) 10 MG tablet Take 1 tablet (10 mg total) by mouth daily for first week of treatment, then increase to 2 tablets (20 mg total) by mouth daily for second week of treatment.Take with food and water . Take as instructed per MD. Start: 07/15/24 07/13/24   Sherrod Sherrod, MD  venetoclax  (  VENCLEXTA ) 100 MG tablet Take 1 tablet (100 mg total) by mouth daily for fourth week of treatment. Take with food and water . Start 08/05/24 06/15/24   Sherrod Sherrod, MD  venetoclax  (VENCLEXTA ) 50 MG tablet Take 1 tablet (50 mg total) by mouth daily for third week of treatment. Take with food and water . Start 07/29/24 06/15/24   Sherrod Sherrod, MD     Family History  Problem Relation Age of Onset   Hypertension Mother    Heart failure Mother    Hypertension Father    Stroke  Father    Breast cancer Sister    Stroke Brother     Social History   Socioeconomic History   Marital status: Divorced    Spouse name: Not on file   Number of children: 2   Years of education: Not on file   Highest education level: Not on file  Occupational History   Not on file  Tobacco Use   Smoking status: Former    Current packs/day: 0.50    Average packs/day: 0.5 packs/day for 10.0 years (5.0 ttl pk-yrs)    Types: Cigarettes    Passive exposure: Never   Smokeless tobacco: Never  Vaping Use   Vaping status: Never Used  Substance and Sexual Activity   Alcohol use: Never   Drug use: Never   Sexual activity: Not on file  Other Topics Concern   Not on file  Social History Narrative   Retired Merchandiser, retail for [employer]   Social Drivers of Corporate investment banker Strain: Not on file  Food Insecurity: No Food Insecurity (07/06/2024)   Hunger Vital Sign    Worried About Running Out of Food in the Last Year: Never true    Ran Out of Food in the Last Year: Never true  Transportation Needs: No Transportation Needs (07/06/2024)   PRAPARE - Administrator, Civil Service (Medical): No    Lack of Transportation (Non-Medical): No  Physical Activity: Not on file  Stress: Not on file  Social Connections: Unknown (07/06/2024)   Social Connection and Isolation Panel    Frequency of Communication with Friends and Family: More than three times a week    Frequency of Social Gatherings with Friends and Family: More than three times a week    Attends Religious Services: More than 4 times per year    Active Member of [employer] or Organizations: Not on file    Attends Engineer, structural: More than 4 times per year    Marital Status: Patient declined     Review of Systems: A 12 point ROS discussed and pertinent positives are indicated in the HPI above.  All other systems are negative.  Review of Systems  Constitutional:  Negative for fatigue and fever.   Respiratory:  Negative for cough and shortness of breath.   Cardiovascular:  Negative for chest pain.  Gastrointestinal:  Negative for abdominal pain.  Musculoskeletal:  Negative for back pain.  Psychiatric/Behavioral:  Negative for behavioral problems and confusion.     Vital Signs: BP 138/89   Pulse 73   Temp 98.6 F (37 C) (Oral)   Resp 16   SpO2 94%   Physical Exam Vitals and nursing note reviewed.  Constitutional:      General: He is not in acute distress.    Appearance: Normal appearance. He is normal weight. He is not ill-appearing.  HENT:     Mouth/Throat:     Mouth: Mucous membranes are  moist.     Pharynx: Oropharynx is clear.  Cardiovascular:     Rate and Rhythm: Normal rate and regular rhythm.  Pulmonary:     Effort: Pulmonary effort is normal. No respiratory distress.     Breath sounds: Normal breath sounds.  Skin:    General: Skin is warm and dry.     Comments: Well-healed scar on R chest from prior Port.  Neurological:     General: No focal deficit present.     Mental Status: He is alert and oriented to person, place, and time. Mental status is at baseline.  Psychiatric:        Mood and Affect: Mood normal.        Behavior: Behavior normal.        Thought Content: Thought content normal.        Judgment: Judgment normal.      MD Evaluation Airway: WNL Heart: WNL Abdomen: WNL Chest/ Lungs: WNL ASA  Classification: 3 Mallampati/Airway Score: Two   Imaging: No results found.  Labs:  CBC: Recent Labs    07/22/24 1338 07/23/24 0823 07/29/24 0819 08/05/24 0844  WBC 7.5 7.3 2.4* 4.3  HGB 10.2* 10.4* 10.6* 10.7*  HCT 31.0* 31.3* 31.4* 32.0*  PLT 118* 116* 56* 106*    COAGS: Recent Labs    07/05/24 1940  INR 1.3*    BMP: Recent Labs    07/22/24 1338 07/23/24 0823 07/29/24 0819 08/05/24 0844  NA 141 142 137 139  139  K 3.8 4.1 4.0 4.4  4.3  CL 104 106 103 104  105  CO2 24 25 24 24  31   GLUCOSE 81 77 80 77  75  BUN 12  11 14 17  19   CALCIUM  9.0 9.1 8.9 9.2  9.2  CREATININE 0.95 0.89 0.84 0.94  0.80  GFRNONAA >60 >60 >60 >60  >60    LIVER FUNCTION TESTS: Recent Labs    07/22/24 1338 07/23/24 0823 07/29/24 0819 08/05/24 0844  BILITOT 1.1 1.1 2.0* 1.2  1.5*  AST 48* 55* 55* 37  24  ALT 13 15 12 9  9   ALKPHOS 47 44 52 56  52  PROT 5.6* 5.6* 5.9* 6.0*  5.8*  ALBUMIN 3.9 3.9 3.8 4.0  3.9    TUMOR MARKERS: No results for input(s): AFPTM, CEA, CA199, CHROMGRNA in the last 8760 hours.  Assessment and Plan: Patient with past medical history of rectal cancer and CLL presents with complaint of poor venous access.  IR consulted for Port-A-Cath placement at the request of Cassandra Heilingoetter, PA-C. Case reviewed by Dr. Hughes who approves patient for procedure.  Patient presents today in their usual state of health.  She has been NPO and is not currently on blood thinners.   Risks and benefits of image guided port-a-catheter placement was discussed with the patient including, but not limited to bleeding, infection, pneumothorax, or fibrin sheath development and need for additional procedures.  All of the patient's questions were answered, patient is agreeable to proceed. Consent signed and in chart.   Thank you for this interesting consult.  I greatly enjoyed meeting SOPHEAP BOEHLE and look forward to participating in their care.  A copy of this report was sent to the requesting provider on this date.  Electronically Signed: Veta Dambrosia Sue-Ellen Juletta Berhe, PA 08/06/2024, 1:08 PM   I spent a total of  30 Minutes   in face to face in clinical consultation, greater than 50% of which was counseling/coordinating care for  CLL

## 2024-08-06 NOTE — Progress Notes (Addendum)
 Error

## 2024-08-06 NOTE — Procedures (Signed)
 Vascular and Interventional Radiology Procedure Note  Patient: Aaron Gonzalez DOB: 12-27-1945 Medical Record Number: [id number] Note Date/Time: 08/06/24 12:53 PM   Performing Physician: Thom Hall, MD Assistant(s): None  Diagnosis: Dx CLL + rectal CA  Procedure: PORT PLACEMENT  Anesthesia: Conscious Sedation Complications: None Estimated Blood Loss: Minimal  Findings:  Successful right-sided port placement, with the tip of the catheter in the proximal right atrium.  Plan: Catheter ready for use.  See detailed procedure note with images in PACS. The patient tolerated the procedure well without incident or complication and was returned to Recovery in stable condition.    Thom Hall, MD [facility]   Pager. [phone] Clinic. [phone]

## 2024-08-09 ENCOUNTER — Telehealth: Payer: Self-pay

## 2024-08-09 NOTE — Telephone Encounter (Signed)
 Spoke with Lauretta in regards to medication management of Acyclovir  400 mg daily and Venetoclax  100 mg daily.  She states the patient is aware.

## 2024-08-09 NOTE — Telephone Encounter (Signed)
 Attempted to contact patient regarding medication management of Acyclovir  400 mg daily and Venetoclax  100 mg ramp-up dose scheduled to start on 08/05/24. Unable to leave a message as the patient's voicemail box was full.  Contacted the patients sister, Aaron Gonzalez and left a voicemail.  Spoke with patients sister, Aaron Gonzalez and informed her of the above medications, requesting confirmation that the patient is taking them as prescribed. Aaron Gonzalez stated she would inform Aaron Gonzalez and have her call the office to confirm whether the patient is taking the medications correctly. She voiced understanding.

## 2024-08-10 ENCOUNTER — Other Ambulatory Visit: Payer: Self-pay

## 2024-08-10 ENCOUNTER — Other Ambulatory Visit: Payer: Self-pay | Admitting: Internal Medicine

## 2024-08-10 MED ORDER — VENETOCLAX 100 MG PO TABS
200.0000 mg | ORAL_TABLET | Freq: Every day | ORAL | 3 refills | Status: DC
  Filled 2024-08-10 – 2024-09-13 (×2): qty 56, 28d supply, fill #0

## 2024-08-12 ENCOUNTER — Other Ambulatory Visit (HOSPITAL_COMMUNITY): Payer: Self-pay

## 2024-08-16 ENCOUNTER — Other Ambulatory Visit: Payer: Self-pay

## 2024-08-17 ENCOUNTER — Other Ambulatory Visit (HOSPITAL_COMMUNITY): Payer: Self-pay

## 2024-08-17 ENCOUNTER — Encounter: Payer: Self-pay | Admitting: Internal Medicine

## 2024-08-17 ENCOUNTER — Telehealth: Payer: Self-pay

## 2024-08-17 NOTE — Telephone Encounter (Signed)
 Oral Oncology Patient Advocate Encounter  Was successful in securing patient a $8000 grant from [facility] to provide copayment coverage for Venclexta .  This will keep the out of pocket expense at $0.     [employer] ID: [id number]   The billing information is as follows and has been shared with [facility].    RxBin: [id number] PCN: [id number] Member ID: [id number] Group ID: [id number] Dates of Eligibility: 07/18/24 through 07/17/25  Fund:  CLL  Lucie Lamer, CPhT [place]  [facility] Oncology Pharmacy Patient Advocate Specialist [address] Phone: [phone]  Fax: [phone] [email or web address]

## 2024-08-19 ENCOUNTER — Inpatient Hospital Stay: Admitting: Internal Medicine

## 2024-08-19 ENCOUNTER — Inpatient Hospital Stay

## 2024-08-19 VITALS — BP 123/68 | HR 69 | Temp 98.7°F | Resp 17 | Ht 65.0 in | Wt 152.0 lb

## 2024-08-19 DIAGNOSIS — C9110 Chronic lymphocytic leukemia of B-cell type not having achieved remission: Secondary | ICD-10-CM

## 2024-08-19 DIAGNOSIS — Z5111 Encounter for antineoplastic chemotherapy: Secondary | ICD-10-CM | POA: Diagnosis not present

## 2024-08-19 LAB — CBC WITH DIFFERENTIAL (CANCER CENTER ONLY)
Abs Immature Granulocytes: 0 K/uL (ref 0.00–0.07)
Basophils Absolute: 0 K/uL (ref 0.0–0.1)
Basophils Relative: 1 %
Eosinophils Absolute: 0 K/uL (ref 0.0–0.5)
Eosinophils Relative: 1 %
HCT: 32.1 % — ABNORMAL LOW (ref 39.0–52.0)
Hemoglobin: 10.8 g/dL — ABNORMAL LOW (ref 13.0–17.0)
Immature Granulocytes: 0 %
Lymphocytes Relative: 42 %
Lymphs Abs: 1.4 K/uL (ref 0.7–4.0)
MCH: 33.9 pg (ref 26.0–34.0)
MCHC: 33.6 g/dL (ref 30.0–36.0)
MCV: 100.6 fL — ABNORMAL HIGH (ref 80.0–100.0)
Monocytes Absolute: 0.4 K/uL (ref 0.1–1.0)
Monocytes Relative: 13 %
Neutro Abs: 1.4 K/uL — ABNORMAL LOW (ref 1.7–7.7)
Neutrophils Relative %: 43 %
Platelet Count: 122 K/uL — ABNORMAL LOW (ref 150–400)
RBC: 3.19 MIL/uL — ABNORMAL LOW (ref 4.22–5.81)
RDW: 17.5 % — ABNORMAL HIGH (ref 11.5–15.5)
WBC Count: 3.3 K/uL — ABNORMAL LOW (ref 4.0–10.5)
nRBC: 0 % (ref 0.0–0.2)

## 2024-08-19 LAB — CMP (CANCER CENTER ONLY)
ALT: 8 U/L (ref 0–44)
AST: 17 U/L (ref 15–41)
Albumin: 4 g/dL (ref 3.5–5.0)
Alkaline Phosphatase: 45 U/L (ref 38–126)
Anion gap: 4 — ABNORMAL LOW (ref 5–15)
BUN: 12 mg/dL (ref 8–23)
CO2: 29 mmol/L (ref 22–32)
Calcium: 9.2 mg/dL (ref 8.9–10.3)
Chloride: 105 mmol/L (ref 98–111)
Creatinine: 0.78 mg/dL (ref 0.61–1.24)
GFR, Estimated: >60 mL/min (ref >60)
Glucose, Bld: 83 mg/dL (ref 70–99)
Potassium: 4.5 mmol/L (ref 3.5–5.1)
Sodium: 138 mmol/L (ref 135–145)
Total Bilirubin: 1.4 mg/dL — ABNORMAL HIGH (ref 0.0–1.2)
Total Protein: 6 g/dL — ABNORMAL LOW (ref 6.5–8.1)

## 2024-08-19 LAB — LACTATE DEHYDROGENASE: LDH: 348 U/L — ABNORMAL HIGH (ref 98–192)

## 2024-08-19 LAB — URIC ACID: Uric Acid, Serum: 4.4 mg/dL (ref 3.7–8.6)

## 2024-08-19 NOTE — Progress Notes (Signed)
 [facility] Telephone:([phone]   [phone]  OFFICE PROGRESS NOTE  Aaron Leni Edyth DELENA, MD [address] [place] [address]  DIAGNOSIS:  1) History of rectal cancer in 2007 status post concurrent chemoradiation followed by surgery. 2) Chronic lymphocytic leukemia diagnosed in 2007.  PRIOR THERAPY:  1) Ibrutinib  420 mg p.o. daily first 2007 and discontinued August 26, 2023 secondary to disease progression. 2) Acalabrutinib  100 mg p.o. twice daily.  First dose started August 28, 2023.  This discontinued and August 2025 secondary to disease progression and rising total white blood count with anemia and thrombocytopenia.  CURRENT THERAPY: Infusional obinutuzumab  in addition to venetoclax .  Status post 2 cycles.  INTERVAL HISTORY: Aaron Gonzalez 78 y.o. male returns to the clinic today for follow-up visit follow-up visit accompanied by his sister. Discussed the use of AI scribe software for clinical note transcription with the patient, who gave verbal consent to proceed.  History of Present Illness Aaron Gonzalez is a 78 year old male with chronic lymphocytic leukemia who presents for evaluation before starting cycle three of obinutuzumab  and venetoclax  treatment. He is accompanied by his sister.  He has a history of chronic lymphocytic leukemia, diagnosed in 2007. Initially treated with acalabrutinib  until November 5th, 2024, when it was discontinued due to disease progression. Subsequently, he was treated with acalabrotinib, which was also discontinued for the same reason. He is currently undergoing treatment with obinutuzumab  and venetoclax , having completed two cycles.  He reports no current complaints and describes his condition as 'pretty good.' He experiences occasional night sweats. His weight has fluctuated, previously dropping to 147 pounds from 160 pounds, but has since increased to 152 pounds. He has a good appetite and no issues with  eating.  No chest pain, shortness of breath, cough, nausea, vomiting, diarrhea, or headaches. He is currently taking venetoclax  at a dose of 200 mg daily without any issues.  He has some swelling in his legs, which he manages by elevating them at night.    MEDICAL HISTORY: Past Medical History:  Diagnosis Date   Arthritis    CLL (chronic lymphocytic leukemia) (HCC)    DVT (deep venous thrombosis) (HCC)    History of rectal cancer    Hypertension    Stroke (HCC)     ALLERGIES:  is allergic to gazyva  [obinutuzumab ].  MEDICATIONS:  Current Outpatient Medications  Medication Sig Dispense Refill   acetaminophen  (TYLENOL ) 325 MG tablet Take 2 tablets (650 mg total) by mouth every 6 (six) hours as needed for mild pain (pain score 1-3) or fever (or Fever >/= 101). 20 tablet 0   acyclovir  (ZOVIRAX ) 400 MG tablet Take 1 tablet (400 mg total) by mouth daily. 30 tablet 5   allopurinol  (ZYLOPRIM ) 300 MG tablet Take 1 tablet (300 mg total) by mouth daily. 30 tablet 0   amLODipine  (NORVASC ) 10 MG tablet Take 1 tablet by mouth daily.     blood glucose meter kit and supplies KIT Dispense based on patient and insurance preference. Use up to four times daily as directed. 1 each 0   carvedilol  (COREG ) 6.25 MG tablet TAKE 1 TABLET(6.25 MG) BY MOUTH TWICE DAILY 180 tablet 3   furosemide  (LASIX ) 20 MG tablet Take 20 mg by mouth daily as needed.     lidocaine -prilocaine  (EMLA ) cream Apply 1 Application topically as needed. 30 g 2   ondansetron  (ZOFRAN ) 8 MG tablet Take 1 tablet (8 mg total) by mouth every 8 (  eight) hours as needed for nausea or vomiting. 30 tablet 1   prochlorperazine  (COMPAZINE ) 10 MG tablet Take 1 tablet (10 mg total) by mouth every 6 (six) hours as needed for nausea or vomiting. 30 tablet 1   senna-docusate (SENOKOT-S) 8.6-50 MG tablet Take 1 tablet by mouth at bedtime as needed for mild constipation. 30 tablet 0   venetoclax  (VENCLEXTA ) 10 MG tablet Take 1 tablet (10 mg total) by  mouth daily for first week of treatment, then increase to 2 tablets (20 mg total) by mouth daily for second week of treatment.Take with food and water . Take as instructed per MD. Start: 07/15/24 28 tablet 0   venetoclax  (VENCLEXTA ) 100 MG tablet Take 1 tablet (100 mg total) by mouth daily for fourth week of treatment. Take with food and water . Start 08/05/24 7 tablet 0   venetoclax  (VENCLEXTA ) 100 MG tablet Take 2 tablets (200 mg total) by mouth daily. Tablets should be swallowed whole with a meal and a full glass of water . 60 tablet 3   venetoclax  (VENCLEXTA ) 50 MG tablet Take 1 tablet (50 mg total) by mouth daily for third week of treatment. Take with food and water . Start 07/29/24 7 tablet 0   No current facility-administered medications for this visit.    SURGICAL HISTORY:  Past Surgical History:  Procedure Laterality Date   COLECTOMY     with colostomy   IR IMAGING GUIDED PORT INSERTION  08/06/2024   TOTAL HIP ARTHROPLASTY Left 10/26/2021   Procedure: Left TOTAL HIP ARTHROPLASTY ANTERIOR APPROACH;  Surgeon: Vernetta Lonni GRADE, MD;  Location: [facility];  Service: Orthopedics;  Laterality: Left;  RNFA    REVIEW OF SYSTEMS:  Constitutional: positive for fatigue Eyes: negative Ears, nose, mouth, throat, and face: negative Respiratory: negative Cardiovascular: negative Gastrointestinal: negative Genitourinary:negative Integument/breast: negative Hematologic/lymphatic: negative Musculoskeletal:negative Neurological: negative Behavioral/Psych: negative Endocrine: negative Allergic/Immunologic: negative   PHYSICAL EXAMINATION: General appearance: alert, cooperative, fatigued, and no distress Head: Normocephalic, without obvious abnormality, atraumatic Neck: no adenopathy, no JVD, supple, symmetrical, trachea midline, and thyroid not enlarged, symmetric, no tenderness/mass/nodules Lymph nodes: Cervical, supraclavicular, and axillary nodes normal. Resp: clear to auscultation  bilaterally Back: symmetric, no curvature. ROM normal. No CVA tenderness. Cardio: regular rate and rhythm, S1, S2 normal, no murmur, click, rub or gallop GI: soft, non-tender; bowel sounds normal; no masses,  no organomegaly Extremities: edema 2+ edema bilaterally Neurologic: Alert and oriented X 3, normal strength and tone. Normal symmetric reflexes. Normal coordination and gait  ECOG PERFORMANCE STATUS: 1 - Symptomatic but completely ambulatory  Blood pressure 123/68, pulse 69, temperature 98.7 F (37.1 C), temperature source Temporal, resp. rate 17, height 5' 5 (1.651 m), weight 152 lb (68.9 kg), SpO2 100%.  LABORATORY DATA: Lab Results  Component Value Date   WBC 3.3 (L) 08/19/2024   HGB 10.8 (L) 08/19/2024   HCT 32.1 (L) 08/19/2024   MCV 100.6 (H) 08/19/2024   PLT 122 (L) 08/19/2024      Chemistry      Component Value Date/Time   NA 138 08/19/2024 1326   NA 139 06/28/2021 0954   K 4.5 08/19/2024 1326   CL 105 08/19/2024 1326   CO2 29 08/19/2024 1326   BUN 12 08/19/2024 1326   BUN 11 06/28/2021 0954   CREATININE 0.78 08/19/2024 1326      Component Value Date/Time   CALCIUM  9.2 08/19/2024 1326   ALKPHOS 45 08/19/2024 1326   AST 17 08/19/2024 1326   ALT 8 08/19/2024 1326  BILITOT 1.4 (H) 08/19/2024 1326       RADIOGRAPHIC STUDIES: IR IMAGING GUIDED PORT INSERTION Result Date: 08/06/2024 INDICATION: Undergoing CLL treatment and poor IV access EXAM: IMPLANTED PORT A CATH PLACEMENT WITH ULTRASOUND AND FLUOROSCOPIC GUIDANCE MEDICATIONS: None ANESTHESIA/SEDATION: Moderate (conscious) sedation was employed during this procedure. A total of Versed  2 mg and Fentanyl  100 mcg was administered intravenously. Moderate Sedation Time: 19 minutes. The patient's level of consciousness and vital signs were monitored continuously by radiology nursing throughout the procedure under my direct supervision. FLUOROSCOPY: Radiation Exposure Index and estimated peak skin dose (PSD);  Reference air kerma (RAK), 0 mGy. COMPLICATIONS: None immediate. PROCEDURE: The procedure, risks, benefits, and alternatives were explained to the patient. Questions regarding the procedure were encouraged and answered. The patient understands and consents to the procedure. The RIGHT neck and chest were prepped with chlorhexidine  in a sterile fashion, and a sterile drape was applied covering the operative field. Maximum barrier sterile technique with sterile gowns and gloves were used for the procedure. A timeout was performed prior to the initiation of the procedure. Local anesthesia was provided with 1% lidocaine  with epinephrine. After creating a small venotomy incision, a micropuncture kit was utilized to access the internal jugular vein under direct, real-time ultrasound guidance. Ultrasound image documentation was performed. The microwire was kinked to measure appropriate catheter length. A subcutaneous port pocket was then created along the upper chest wall utilizing a combination of sharp and blunt dissection. The pocket was irrigated with sterile saline. A single lumen power injectable port was chosen for placement. The 8 Fr catheter was tunneled from the port pocket site to the venotomy incision. The port was placed in the pocket. The external catheter was trimmed to appropriate length. At the venotomy, an 8 Fr peel-away sheath was placed over a guidewire under fluoroscopic guidance. The catheter was then placed through the sheath and the sheath was removed. Final catheter positioning was confirmed and documented with a fluoroscopic spot radiograph. The port was accessed with a Huber needle, aspirated and flushed with heparinized saline. The port pocket incision was closed with interrupted 3-0 Vicryl suture then Dermabond was applied, including at the venotomy incision. Dressings were placed. The patient tolerated the procedure well without immediate post procedural complication. IMPRESSION: Successful  placement of a RIGHT internal jugular approach power injectable Port-A-Cath. The tip of the catheter is positioned within the proximal RIGHT atrium. The catheter is ready for immediate use. Thom Hall, MD [facility] Electronically Signed   By: Thom Hall M.D.   On: 08/06/2024 15:53    ASSESSMENT AND PLAN: This is a very pleasant 78 years old African-American male with history of rectal cancer diagnosed in 2007 status post concurrent chemoradiation followed by surgical resection.  At the same time the patient was diagnosed with chronic lymphocytic leukemia and has been in observation initially for several years but started on treatment with ibrutinib  420 mg in 2019.  He tolerated his treatment well until November 2024 when he had evidence for disease progression.  We switched his treatment to acalabrutinib  100 mg p.o. twice daily started August 28, 2023 and he has been tolerating it fairly well. CBC today showed continuous increase in the total white blood count suspicious for disease progression. He is currently undergoing treatment with obinutuzumab  and oral venetoclax . Patient is tolerating his treatment fairly well with no concerning adverse effects. Assessment and Plan Assessment & Plan Chronic lymphocytic leukemia, currently treated with obinutuzumab  and venetoclax  Chronic  lymphocytic leukemia diagnosed in 2007. Previously treated with acalabrutinib  and acalabrotinib, both discontinued due to disease progression. Currently undergoing treatment with obinutuzumab  and venetoclax , which appears to be controlling the cancer well. No significant complaints or side effects reported. Lab work is stable and acceptable for continued treatment. - Continue oral venetoclax  200 mg daily - Scheduled next cycle of treatment in four weeks  Lower extremity swelling Reports swelling in the legs. Elevates legs at night to manage swelling. - Continue  leg elevation at night  History of rectal cancerin 2007. - Currently on observation  The patient voices understanding of current disease status and treatment options and is in agreement with the current care plan. All questions were answered. The patient knows to call the clinic with any problems, questions or concerns. We can certainly see the patient much sooner if necessary. The total time spent in the appointment was 30 minutes including review of chart and various tests results, discussions about plan of care and coordination of care plan .  Disclaimer: This note was dictated with voice recognition software. Similar sounding words can inadvertently be transcribed and may not be corrected upon review.

## 2024-08-20 ENCOUNTER — Inpatient Hospital Stay

## 2024-08-20 ENCOUNTER — Other Ambulatory Visit: Payer: Self-pay | Admitting: Physician Assistant

## 2024-08-20 ENCOUNTER — Encounter: Payer: Self-pay | Admitting: Internal Medicine

## 2024-08-20 VITALS — BP 126/85 | HR 72 | Temp 97.6°F | Resp 16

## 2024-08-20 DIAGNOSIS — C9110 Chronic lymphocytic leukemia of B-cell type not having achieved remission: Secondary | ICD-10-CM

## 2024-08-20 DIAGNOSIS — Z5111 Encounter for antineoplastic chemotherapy: Secondary | ICD-10-CM | POA: Diagnosis not present

## 2024-08-20 MED ORDER — FAMOTIDINE IN NACL 20-0.9 MG/50ML-% IV SOLN
20.0000 mg | Freq: Once | INTRAVENOUS | Status: AC
  Administered 2024-08-20: 20 mg via INTRAVENOUS
  Filled 2024-08-20: qty 50

## 2024-08-20 MED ORDER — ACETAMINOPHEN 325 MG PO TABS
650.0000 mg | ORAL_TABLET | Freq: Once | ORAL | Status: AC
  Administered 2024-08-20: 650 mg via ORAL
  Filled 2024-08-20: qty 2

## 2024-08-20 MED ORDER — CETIRIZINE HCL 10 MG/ML IV SOLN
10.0000 mg | Freq: Once | INTRAVENOUS | Status: AC
  Administered 2024-08-20: 10 mg via INTRAVENOUS
  Filled 2024-08-20: qty 1

## 2024-08-20 MED ORDER — METHYLPREDNISOLONE SODIUM SUCC 125 MG IJ SOLR
125.0000 mg | Freq: Once | INTRAMUSCULAR | Status: AC
  Administered 2024-08-20: 125 mg via INTRAVENOUS
  Filled 2024-08-20: qty 2

## 2024-08-20 MED ORDER — SODIUM CHLORIDE 0.9 % IV SOLN
1000.0000 mg | Freq: Once | INTRAVENOUS | Status: AC
  Administered 2024-08-20: 1000 mg via INTRAVENOUS
  Filled 2024-08-20: qty 40

## 2024-08-20 MED ORDER — SODIUM CHLORIDE 0.9 % IV SOLN: INTRAVENOUS | Status: DC

## 2024-08-20 MED ORDER — SODIUM CHLORIDE 0.9% FLUSH: 10.0000 mL | INTRAVENOUS | Status: DC | PRN

## 2024-08-20 NOTE — Patient Instructions (Signed)
 [facility] - A DEPT OF [facility]  Discharge Instructions: Thank you for choosing [facility] to provide your oncology and hematology care.   If you have a lab appointment with the Cancer Center, please go directly to the Cancer Center and check in at the registration area.   Wear comfortable clothing and clothing appropriate for easy access to any Portacath or PICC line.   We strive to give you quality time with your provider. You may need to reschedule your appointment if you arrive late (15 or more minutes).  Arriving late affects you and other patients whose appointments are after yours.  Also, if you miss three or more appointments without notifying the office, you may be dismissed from the clinic at the provider's discretion.      For prescription refill requests, have your pharmacy contact our office and allow 72 hours for refills to be completed.    Today you received the following chemotherapy and/or immunotherapy agents gazyva       To help prevent nausea and vomiting after your treatment, we encourage you to take your nausea medication as directed.  BELOW ARE SYMPTOMS THAT SHOULD BE REPORTED IMMEDIATELY: *FEVER GREATER THAN 100.4 F (38 C) OR HIGHER *CHILLS OR SWEATING *NAUSEA AND VOMITING THAT IS NOT CONTROLLED WITH YOUR NAUSEA MEDICATION *UNUSUAL SHORTNESS OF BREATH *UNUSUAL BRUISING OR BLEEDING *URINARY PROBLEMS (pain or burning when urinating, or frequent urination) *BOWEL PROBLEMS (unusual diarrhea, constipation, pain near the anus) TENDERNESS IN MOUTH AND THROAT WITH OR WITHOUT PRESENCE OF ULCERS (sore throat, sores in mouth, or a toothache) UNUSUAL RASH, SWELLING OR PAIN  UNUSUAL VAGINAL DISCHARGE OR ITCHING   Items with * indicate a potential emergency and should be followed up as soon as possible or go to the Emergency Department if any problems should occur.  Please show the CHEMOTHERAPY ALERT CARD or IMMUNOTHERAPY  ALERT CARD at check-in to the Emergency Department and triage nurse.  Should you have questions after your visit or need to cancel or reschedule your appointment, please contact [facility] - A DEPT OF [facility]  Dept: [phone]  and follow the prompts.  Office hours are 8:00 a.m. to 4:30 p.m. Monday - Friday. Please note that voicemails left after 4:00 p.m. may not be returned until the following business day.  We are closed weekends and major holidays. You have access to a nurse at all times for urgent questions. Please call the main number to the clinic Dept: [phone] and follow the prompts.   For any non-urgent questions, you may also contact your provider using MyChart. We now offer e-Visits for anyone 57 and older to request care online for non-urgent symptoms. For details visit [email or web address].   Also download the MyChart app! Go to the app store, search "MyChart", open the app, select [facility], and log in with your MyChart username and password.

## 2024-08-21 ENCOUNTER — Other Ambulatory Visit: Payer: Self-pay

## 2024-08-23 ENCOUNTER — Other Ambulatory Visit: Payer: Self-pay

## 2024-08-24 ENCOUNTER — Ambulatory Visit: Admitting: Podiatry

## 2024-08-24 ENCOUNTER — Encounter: Payer: Self-pay | Admitting: Podiatry

## 2024-08-24 DIAGNOSIS — B351 Tinea unguium: Secondary | ICD-10-CM

## 2024-08-24 DIAGNOSIS — M79675 Pain in left toe(s): Secondary | ICD-10-CM

## 2024-08-24 DIAGNOSIS — M79674 Pain in right toe(s): Secondary | ICD-10-CM | POA: Diagnosis not present

## 2024-08-24 DIAGNOSIS — I739 Peripheral vascular disease, unspecified: Secondary | ICD-10-CM | POA: Diagnosis not present

## 2024-08-25 ENCOUNTER — Other Ambulatory Visit: Payer: Self-pay

## 2024-08-29 NOTE — Progress Notes (Signed)
  Subjective:  Patient ID: Aaron Gonzalez, male    DOB: Oct 30, 1945,  MRN: [id number]  Aaron Gonzalez presents to clinic today for at risk foot care. Patient has h/o PAD and painful mycotic toenails of both feet that are difficult to trim. Pain interferes with daily activities and wearing enclosed shoe gear comfortably.  Chief Complaint  Patient presents with   RFC    RFC not diabetic  PCP [name], MD Oct. 2025   New problem(s): None.   PCP is [name], MD.  Allergies  Allergen Reactions   Gazyva  [Obinutuzumab ] Shortness Of Breath, Nausea And Vomiting and Other (See Comments)    Pt complained of nausea, had emesis, diaphoretic, facial flushing, headache and back pain. See progress notes 06/24/24 for information. Patient with new reaction on 07/02/2024 with shortness of breath. See progress note from 07/02/2024.    Review of Systems: Negative except as noted in the HPI.  Objective: No changes noted in today's physical examination. There were no vitals filed for this visit. Aaron Gonzalez is a pleasant 78 y.o. male WD, WN in NAD. AAO x 3.  Vascular Examination: CFT less than 3 seconds. DP pulses palpable b/l. PT pulses nonpalpable b/l. Digital hair absent. Skin temperature gradient warm to warn b/l. No ischemia or gangrene. No cyanosis or clubbing noted b/l. Unilateral lymphedema left LE.   Neurological Examination: Sensation grossly intact b/l with 10 gram monofilament. Vibratory sensation intact b/l.   Dermatological Examination: Pedal skin thin, shiny and atrophic b/l. No open wounds. No interdigital macerations.   Raised benign papule noted anterior aspect left ankle. Annular with no asymmetry, normal border, normal color.  Toenails 1-5 b/l thick, discolored, elongated with subungual debris and pain on dorsal palpation.   No hyperkeratotic nor porokeratotic lesions present on today's visit.  Musculoskeletal Examination: Muscle strength 5/5 to all lower  extremity muscle groups bilaterally. No pain, crepitus or joint limitation noted with ROM bilateral LE. Pes planus deformity noted bilateral LE.  Radiographs: None  Assessment/Plan: 1. Pain due to onychomycosis of toenails of both feet   2. PAD (peripheral artery disease)   -Patient was evaluated today. All questions/concerns addressed on today's visit. -Examined patient. -Patient to continue soft, supportive shoe gear daily. -Mycotic toenails 1-5 bilaterally were debrided in length and girth with sterile nail nippers and dremel. Dremel abrasion of R 2nd toe addressed with Lumicain Hemostatic Solution, cleansed with alcohol. Triple antibiotic ointment applied. Patient/caregiver instructed to apply Neosporin Cream once daily for 7 days. -Patient/POA to call should there be question/concern in the interim.   Return in about 3 months (around 11/24/2024).  Delon LITTIE Merlin, DPM      [facility] LOCATION: [address], [place] [address]                   Office [phone]   [facility] LOCATION: [address] [place], [place] [address] Office [phone]

## 2024-09-08 NOTE — Progress Notes (Signed)
 Aaron Gonzalez OFFICE PROGRESS NOTE  Aaron Leni Edyth DELENA, Aaron Gonzalez [address] [place] [address]  DIAGNOSIS: 1) History of rectal cancer in 2007 status post concurrent chemoradiation followed by surgery. 2) Chronic lymphocytic leukemia diagnosed in 2007.   PRIOR THERAPY: 1) Ibrutinib  420 mg p.o. daily first 2007 and discontinued August 26, 2023 secondary to disease progression. 2) Acalabrutinib  100 mg p.o. twice daily.  First dose started August 28, 2023.  This discontinued and August 2025 secondary to disease progression and rising total white blood count with anemia and thrombocytopenia.  CURRENT THERAPY: Infusional obinutuzumab  in addition to venetoclax . First dose on 06/25/24. Venetoclax  started on 07/15/24    INTERVAL HISTORY: Aaron Gonzalez 77 y.o. male returns to the clinic today for a follow-up visit. The patient was last seen in clinic by Dr. Sherrod on 08/19/24.   The patient was started on treatment for progressive CLL the week on 06/25/2024.  He is undergoing treatment with obinutuzumab .    He also started on treatment with venetoclax  on 07/15/2024.  Thus far he has been tolerating it without adverse side effects.  However, upon further questioning today, it sounds like the patient has not been taking his venetoclax  in several weeks.  He states that he ran out.  He has also been having some trouble with his phone.  Pharmacy reach out to the specialty pharmacy and it sounds like he never started his 200 mg dose.  The last dispensed venetoclax  was 100 mg.   He denies any abnormal bleeding or bruising. He denies any signs and symptoms of infection including fever, nasal congestion, sore throat, skin infections, burning with urination, dysuria, diarrhea, or abdominal pain. He denies any shortness of breath, night sweats, chest pain,  or lymphadenopathy.  He has stable to improve lower extremity swelling.  He sometimes wears compression stockings. He is here today for evaluation  and repeat blood work.  He is scheduled for his next IV infusion at this week on 09/17/2024.  MEDICAL HISTORY: Past Medical History:  Diagnosis Date   Arthritis    CLL (chronic lymphocytic leukemia) (HCC)    DVT (deep venous thrombosis) (HCC)    History of rectal cancer    Hypertension    Stroke (HCC)     ALLERGIES:  is allergic to gazyva  [obinutuzumab ].  MEDICATIONS:  Current Outpatient Medications  Medication Sig Dispense Refill   acetaminophen  (TYLENOL ) 325 MG tablet Take 2 tablets (650 mg total) by mouth every 6 (six) hours as needed for mild pain (pain score 1-3) or fever (or Fever >/= 101). 20 tablet 0   acyclovir  (ZOVIRAX ) 400 MG tablet Take 1 tablet (400 mg total) by mouth daily. 30 tablet 5   allopurinol  (ZYLOPRIM ) 300 MG tablet Take 1 tablet (300 mg total) by mouth daily. 30 tablet 0   amLODipine  (NORVASC ) 10 MG tablet Take 1 tablet by mouth daily.     blood glucose meter kit and supplies KIT Dispense based on patient and insurance preference. Use up to four times daily as directed. 1 each 0   carvedilol  (COREG ) 6.25 MG tablet TAKE 1 TABLET(6.25 MG) BY MOUTH TWICE DAILY 180 tablet 3   furosemide  (LASIX ) 20 MG tablet Take 20 mg by mouth daily as needed.     lidocaine -prilocaine  (EMLA ) cream Apply 1 Application topically as needed. 30 g 2   ondansetron  (ZOFRAN ) 8 MG tablet Take 1 tablet (8 mg total) by mouth every 8 (eight) hours as needed for nausea or vomiting. 30  tablet 1   prochlorperazine  (COMPAZINE ) 10 MG tablet Take 1 tablet (10 mg total) by mouth every 6 (six) hours as needed for nausea or vomiting. 30 tablet 1   senna-docusate (SENOKOT-S) 8.6-50 MG tablet Take 1 tablet by mouth at bedtime as needed for mild constipation. 30 tablet 0   venetoclax  (VENCLEXTA ) 10 MG tablet Take 1 tablet (10 mg total) by mouth daily for first week of treatment, then increase to 2 tablets (20 mg total) by mouth daily for second week of treatment.Take with food and water . Take as instructed  per Aaron Gonzalez. Start: 07/15/24 28 tablet 0   venetoclax  (VENCLEXTA ) 100 MG tablet Take 2 tablets (200 mg total) by mouth daily. Tablets should be swallowed whole with a meal and a full glass of water . 60 tablet 3   venetoclax  (VENCLEXTA ) 100 MG tablet Take 1 tablet (100 mg total) by mouth daily for 1 week. Take with food and water . After 1 week, please take 2 tablets by mouth once a day 49 tablet 0   venetoclax  (VENCLEXTA ) 50 MG tablet Take 1 tablet (50 mg total) by mouth daily for third week of treatment. Take with food and water . Start 07/29/24 7 tablet 0   No current facility-administered medications for this visit.    SURGICAL HISTORY:  Past Surgical History:  Procedure Laterality Date   COLECTOMY     with colostomy   IR IMAGING GUIDED PORT INSERTION  08/06/2024   TOTAL HIP ARTHROPLASTY Left 10/26/2021   Procedure: Left TOTAL HIP ARTHROPLASTY ANTERIOR APPROACH;  Surgeon: Vernetta Lonni GRADE, Aaron Gonzalez;  Location: [facility];  Service: Orthopedics;  Laterality: Left;  RNFA    REVIEW OF SYSTEMS:   Review of Systems  Constitutional: Negative for appetite change, chills, fatigue, fever and unexpected weight change.  HENT: Negative for mouth sores, nosebleeds, sore throat and trouble swallowing.   Eyes: Negative for eye problems and icterus.  Respiratory: Negative for cough, hemoptysis, shortness of breath and wheezing.   Cardiovascular: Negative for chest pain and leg swelling.  Gastrointestinal: Negative for abdominal pain, constipation, diarrhea, nausea and vomiting.  Genitourinary: Negative for bladder incontinence, difficulty urinating, dysuria, frequency and hematuria.   Musculoskeletal: Negative for back pain, gait problem, neck pain and neck stiffness.  Skin: Negative for itching and rash.  Neurological: Negative for dizziness, extremity weakness, gait problem, headaches, light-headedness and seizures.  Hematological: Negative for adenopathy. Does not bruise/bleed easily.  Psychiatric/Behavioral:  Negative for confusion, depression and sleep disturbance. The patient is not nervous/anxious.     PHYSICAL EXAMINATION:  Blood pressure 138/86, pulse 68, temperature 98.4 F (36.9 C), resp. rate 18, weight 155 lb 6.4 oz (70.5 kg), SpO2 96%.  ECOG PERFORMANCE STATUS: 1  Physical Exam  Constitutional: Oriented to person, place, and time and well-developed, well-nourished, and in no distress.  HENT:  Head: Normocephalic and atraumatic.  Mouth/Throat: Oropharynx is clear and moist. No oropharyngeal exudate.  Eyes: Conjunctivae are normal. Right eye exhibits no discharge. Left eye exhibits no discharge. No scleral icterus.  Neck: Normal range of motion. Neck supple.  Cardiovascular: Normal rate, regular rhythm, normal heart sounds and intact distal pulses.   Pulmonary/Chest: Effort normal and breath sounds normal. No respiratory distress. No wheezes. No rales.  Abdominal: Soft. Bowel sounds are normal. Exhibits no distension and no mass. There is no tenderness.  Musculoskeletal: Normal range of motion. Exhibits no edema.  Lymphadenopathy:    No cervical adenopathy.  Neurological: Alert and oriented to person, place, and time. Exhibits normal muscle tone.  Gait normal. Coordination normal.  Skin: Skin is warm and dry. No rash noted. Not diaphoretic. No erythema. No pallor.  Psychiatric: Mood, memory and judgment normal.  Vitals reviewed.  LABORATORY DATA: Lab Results  Component Value Date   WBC 3.8 (L) 09/13/2024   HGB 12.6 (L) 09/13/2024   HCT 36.5 (L) 09/13/2024   MCV 100.3 (H) 09/13/2024   PLT 162 09/13/2024      Chemistry      Component Value Date/Time   NA 141 09/13/2024 1434   NA 139 06/28/2021 0954   K 3.8 09/13/2024 1434   CL 104 09/13/2024 1434   CO2 29 09/13/2024 1434   BUN 15 09/13/2024 1434   BUN 11 06/28/2021 0954   CREATININE 0.85 09/13/2024 1434      Component Value Date/Time   CALCIUM  9.5 09/13/2024 1434   ALKPHOS 57 09/13/2024 1434   AST 22 09/13/2024  1434   ALT 13 09/13/2024 1434   BILITOT 1.0 09/13/2024 1434       RADIOGRAPHIC STUDIES:  No results found.   ASSESSMENT/PLAN:  This is a very pleasant 78 year old African-American male with a history of rectal cancer in 2007. He is status post concurrent chemoradiation followed by surgical resection. At the same time he was diagnosed with chronic lymphocytic leukemia and he has been on observation initially for several years which was started on treatment with ibrutinib  420 mg in 2019.    He was found to have evidence of disease progression in early November 2024. Therefore he was started on acalabrutinib  around 08/28/2023.  This was discontinued in August 2025 due to disease progression.   Therefore Dr. Sherrod started the patient on treatment with venetoclax  and Obinutuzumab .  His first ifusion was on 06/25/24. He started venetoclax  around 07/15/24.  He is currently on 200 mg of venetoclax    Labs were reviewed. His WBC is 3.8. His Hbg is 12.6. His platelet count is 162k.    He will continue on the same treatment at the same dose.   Upon further questioning the patient never started his 200 mg dose of venetoclax .  Talk to the pharmacy who will reach out to the specialty pharmacy.  The patient will start his venetoclax  back at 100 mg for 1 week and then increase to 200 mg daily if he has good tolerance.  I gave the patient the number to the [facility] pharmacy so he could always follow-up if for some reason he is not able to obtain his venetoclax .  The miscommunication may have been secondary to him having trouble with his phone.  I explained to him again that this will be a continuous medicine for a year.    Peripheral edema - Advise wearing compression stockings during the day. - Advised to elevate   We will see him back for labs and follow up in 4 weeks.   The patient was advised to call immediately if he has any concerning symptoms in the interval. The patient voices understanding of  current disease status and treatment options and is in agreement with the current care plan. All questions were answered. The patient knows to call the clinic with any problems, questions or concerns. We can certainly see the patient much sooner if necessary        No orders of the defined types were placed in this encounter.    The total time spent in the appointment was 20-29 minutes  Kendall Justo L Frimet Durfee, PA-C 09/13/24

## 2024-09-13 ENCOUNTER — Other Ambulatory Visit (HOSPITAL_COMMUNITY): Payer: Self-pay

## 2024-09-13 ENCOUNTER — Inpatient Hospital Stay

## 2024-09-13 ENCOUNTER — Other Ambulatory Visit: Payer: Self-pay

## 2024-09-13 ENCOUNTER — Inpatient Hospital Stay: Attending: Internal Medicine | Admitting: Physician Assistant

## 2024-09-13 VITALS — BP 138/86 | HR 68 | Temp 98.4°F | Resp 18 | Wt 155.4 lb

## 2024-09-13 DIAGNOSIS — M1990 Unspecified osteoarthritis, unspecified site: Secondary | ICD-10-CM | POA: Insufficient documentation

## 2024-09-13 DIAGNOSIS — C9110 Chronic lymphocytic leukemia of B-cell type not having achieved remission: Secondary | ICD-10-CM | POA: Diagnosis not present

## 2024-09-13 DIAGNOSIS — Z85048 Personal history of other malignant neoplasm of rectum, rectosigmoid junction, and anus: Secondary | ICD-10-CM | POA: Diagnosis not present

## 2024-09-13 DIAGNOSIS — D696 Thrombocytopenia, unspecified: Secondary | ICD-10-CM | POA: Insufficient documentation

## 2024-09-13 DIAGNOSIS — Z923 Personal history of irradiation: Secondary | ICD-10-CM | POA: Insufficient documentation

## 2024-09-13 DIAGNOSIS — Z933 Colostomy status: Secondary | ICD-10-CM | POA: Diagnosis not present

## 2024-09-13 DIAGNOSIS — Z5111 Encounter for antineoplastic chemotherapy: Secondary | ICD-10-CM | POA: Diagnosis present

## 2024-09-13 DIAGNOSIS — C9512 Chronic leukemia of unspecified cell type, in relapse: Secondary | ICD-10-CM

## 2024-09-13 DIAGNOSIS — I10 Essential (primary) hypertension: Secondary | ICD-10-CM | POA: Insufficient documentation

## 2024-09-13 DIAGNOSIS — Z79899 Other long term (current) drug therapy: Secondary | ICD-10-CM | POA: Insufficient documentation

## 2024-09-13 DIAGNOSIS — D649 Anemia, unspecified: Secondary | ICD-10-CM | POA: Diagnosis not present

## 2024-09-13 LAB — CBC WITH DIFFERENTIAL (CANCER CENTER ONLY)
Abs Immature Granulocytes: 0 K/uL (ref 0.00–0.07)
Basophils Absolute: 0.1 K/uL (ref 0.0–0.1)
Basophils Relative: 2 %
Eosinophils Absolute: 0.2 K/uL (ref 0.0–0.5)
Eosinophils Relative: 5 %
HCT: 36.5 % — ABNORMAL LOW (ref 39.0–52.0)
Hemoglobin: 12.6 g/dL — ABNORMAL LOW (ref 13.0–17.0)
Immature Granulocytes: 0 %
Lymphocytes Relative: 35 %
Lymphs Abs: 1.4 K/uL (ref 0.7–4.0)
MCH: 34.6 pg — ABNORMAL HIGH (ref 26.0–34.0)
MCHC: 34.5 g/dL (ref 30.0–36.0)
MCV: 100.3 fL — ABNORMAL HIGH (ref 80.0–100.0)
Monocytes Absolute: 0.3 K/uL (ref 0.1–1.0)
Monocytes Relative: 8 %
Neutro Abs: 1.9 K/uL (ref 1.7–7.7)
Neutrophils Relative %: 50 %
Platelet Count: 162 K/uL (ref 150–400)
RBC: 3.64 MIL/uL — ABNORMAL LOW (ref 4.22–5.81)
RDW: 13.5 % (ref 11.5–15.5)
WBC Count: 3.8 K/uL — ABNORMAL LOW (ref 4.0–10.5)
nRBC: 0 % (ref 0.0–0.2)

## 2024-09-13 LAB — CMP (CANCER CENTER ONLY)
ALT: 13 U/L (ref 0–44)
AST: 22 U/L (ref 15–41)
Albumin: 4.3 g/dL (ref 3.5–5.0)
Alkaline Phosphatase: 57 U/L (ref 38–126)
Anion gap: 8 (ref 5–15)
BUN: 15 mg/dL (ref 8–23)
CO2: 29 mmol/L (ref 22–32)
Calcium: 9.5 mg/dL (ref 8.9–10.3)
Chloride: 104 mmol/L (ref 98–111)
Creatinine: 0.85 mg/dL (ref 0.61–1.24)
GFR, Estimated: >60 mL/min (ref >60)
Glucose, Bld: 101 mg/dL — ABNORMAL HIGH (ref 70–99)
Potassium: 3.8 mmol/L (ref 3.5–5.1)
Sodium: 141 mmol/L (ref 135–145)
Total Bilirubin: 1 mg/dL (ref 0.0–1.2)
Total Protein: 6.4 g/dL — ABNORMAL LOW (ref 6.5–8.1)

## 2024-09-13 LAB — URIC ACID: Uric Acid, Serum: 4.6 mg/dL (ref 3.7–8.6)

## 2024-09-13 LAB — LACTATE DEHYDROGENASE: LDH: 186 U/L (ref 105–235)

## 2024-09-13 MED ORDER — VENETOCLAX 100 MG PO TABS: ORAL_TABLET | ORAL | 0 refills | Status: DC

## 2024-09-13 MED ORDER — VENETOCLAX 100 MG PO TABS
ORAL_TABLET | ORAL | 0 refills | Status: DC
  Filled 2024-09-13: qty 49, 28d supply, fill #0

## 2024-09-13 NOTE — Progress Notes (Signed)
 Specialty Pharmacy Refill Coordination Note  Aaron Gonzalez is a 78 y.o. male contacted today regarding refills of specialty medication(s) Venetoclax  (VENCLEXTA )   Patient requested Delivery   Delivery date: 09/15/24   Verified address: [address]   [place] [address]   Medication will be filled on: 09/14/24

## 2024-09-14 ENCOUNTER — Telehealth: Payer: Self-pay | Admitting: Pharmacist

## 2024-09-14 ENCOUNTER — Other Ambulatory Visit: Payer: Self-pay

## 2024-09-14 DIAGNOSIS — C9110 Chronic lymphocytic leukemia of B-cell type not having achieved remission: Secondary | ICD-10-CM

## 2024-09-14 MED ORDER — VENETOCLAX 100 MG PO TABS
ORAL_TABLET | ORAL | 0 refills | Status: DC
  Filled 2024-09-14: qty 56, 30d supply, fill #0

## 2024-09-14 NOTE — Telephone Encounter (Signed)
 Oral Oncology Pharmacist Encounter  Venclexta  prescription quantity updated to #56 due to packaging restrictions of medication. Updated prescription resent to [facility] for dispensing. Patient is aware to take 100 mg daily for 1st week of resuming venclexta  then increase to 200 mg daily thereafter.   Asberry Macintosh, PharmD, BCPS, BCOP Hematology/Oncology Clinical Pharmacist [phone] 09/14/2024 1:53 PM

## 2024-09-17 ENCOUNTER — Inpatient Hospital Stay

## 2024-09-17 VITALS — BP 133/79 | HR 72 | Temp 98.2°F | Resp 16 | Wt 154.2 lb

## 2024-09-17 DIAGNOSIS — C9110 Chronic lymphocytic leukemia of B-cell type not having achieved remission: Secondary | ICD-10-CM

## 2024-09-17 DIAGNOSIS — Z5111 Encounter for antineoplastic chemotherapy: Secondary | ICD-10-CM | POA: Diagnosis not present

## 2024-09-17 MED ORDER — ACETAMINOPHEN 325 MG PO TABS
650.0000 mg | ORAL_TABLET | Freq: Once | ORAL | Status: AC
  Administered 2024-09-17: 650 mg via ORAL
  Filled 2024-09-17: qty 2

## 2024-09-17 MED ORDER — SODIUM CHLORIDE 0.9 % IV SOLN
1000.0000 mg | Freq: Once | INTRAVENOUS | Status: AC
  Administered 2024-09-17: 1000 mg via INTRAVENOUS
  Filled 2024-09-17: qty 40

## 2024-09-17 MED ORDER — CETIRIZINE HCL 10 MG/ML IV SOLN
10.0000 mg | Freq: Once | INTRAVENOUS | Status: AC
  Administered 2024-09-17: 10 mg via INTRAVENOUS
  Filled 2024-09-17: qty 1

## 2024-09-17 MED ORDER — METHYLPREDNISOLONE SODIUM SUCC 125 MG IJ SOLR
125.0000 mg | Freq: Once | INTRAMUSCULAR | Status: AC
  Administered 2024-09-17: 125 mg via INTRAVENOUS
  Filled 2024-09-17: qty 2

## 2024-09-17 MED ORDER — SODIUM CHLORIDE 0.9 % IV SOLN: INTRAVENOUS | Status: DC

## 2024-09-17 MED ORDER — FAMOTIDINE IN NACL 20-0.9 MG/50ML-% IV SOLN
20.0000 mg | Freq: Once | INTRAVENOUS | Status: AC
  Administered 2024-09-17: 20 mg via INTRAVENOUS
  Filled 2024-09-17: qty 50

## 2024-09-17 NOTE — Patient Instructions (Signed)
 [facility] - A DEPT OF [facility]  Discharge Instructions: Thank you for choosing [facility] to provide your oncology and hematology care.   If you have a lab appointment with the Cancer Center, please go directly to the Cancer Center and check in at the registration area.   Wear comfortable clothing and clothing appropriate for easy access to any Portacath or PICC line.   We strive to give you quality time with your provider. You may need to reschedule your appointment if you arrive late (15 or more minutes).  Arriving late affects you and other patients whose appointments are after yours.  Also, if you miss three or more appointments without notifying the office, you may be dismissed from the clinic at the provider's discretion.      For prescription refill requests, have your pharmacy contact our office and allow 72 hours for refills to be completed.    Today you received the following chemotherapy and/or immunotherapy agents gazyva       To help prevent nausea and vomiting after your treatment, we encourage you to take your nausea medication as directed.  BELOW ARE SYMPTOMS THAT SHOULD BE REPORTED IMMEDIATELY: *FEVER GREATER THAN 100.4 F (38 C) OR HIGHER *CHILLS OR SWEATING *NAUSEA AND VOMITING THAT IS NOT CONTROLLED WITH YOUR NAUSEA MEDICATION *UNUSUAL SHORTNESS OF BREATH *UNUSUAL BRUISING OR BLEEDING *URINARY PROBLEMS (pain or burning when urinating, or frequent urination) *BOWEL PROBLEMS (unusual diarrhea, constipation, pain near the anus) TENDERNESS IN MOUTH AND THROAT WITH OR WITHOUT PRESENCE OF ULCERS (sore throat, sores in mouth, or a toothache) UNUSUAL RASH, SWELLING OR PAIN  UNUSUAL VAGINAL DISCHARGE OR ITCHING   Items with * indicate a potential emergency and should be followed up as soon as possible or go to the Emergency Department if any problems should occur.  Please show the CHEMOTHERAPY ALERT CARD or IMMUNOTHERAPY  ALERT CARD at check-in to the Emergency Department and triage nurse.  Should you have questions after your visit or need to cancel or reschedule your appointment, please contact [facility] - A DEPT OF [facility]  Dept: [phone]  and follow the prompts.  Office hours are 8:00 a.m. to 4:30 p.m. Monday - Friday. Please note that voicemails left after 4:00 p.m. may not be returned until the following business day.  We are closed weekends and major holidays. You have access to a nurse at all times for urgent questions. Please call the main number to the clinic Dept: [phone] and follow the prompts.   For any non-urgent questions, you may also contact your provider using MyChart. We now offer e-Visits for anyone 57 and older to request care online for non-urgent symptoms. For details visit [email or web address].   Also download the MyChart app! Go to the app store, search "MyChart", open the app, select [facility], and log in with your MyChart username and password.

## 2024-09-22 ENCOUNTER — Other Ambulatory Visit: Payer: Self-pay

## 2024-09-22 ENCOUNTER — Other Ambulatory Visit (HOSPITAL_COMMUNITY): Payer: Self-pay

## 2024-10-04 ENCOUNTER — Other Ambulatory Visit: Payer: Self-pay | Admitting: Physician Assistant

## 2024-10-04 ENCOUNTER — Other Ambulatory Visit: Payer: Self-pay

## 2024-10-04 DIAGNOSIS — C9110 Chronic lymphocytic leukemia of B-cell type not having achieved remission: Secondary | ICD-10-CM

## 2024-10-04 MED ORDER — VENETOCLAX 100 MG PO TABS
200.0000 mg | ORAL_TABLET | Freq: Every day | ORAL | 0 refills | Status: DC
  Filled 2024-10-04: qty 120, 60d supply, fill #0
  Filled 2024-10-05: qty 56, 28d supply, fill #0

## 2024-10-05 ENCOUNTER — Other Ambulatory Visit: Payer: Self-pay

## 2024-10-07 ENCOUNTER — Other Ambulatory Visit: Payer: Self-pay

## 2024-10-11 ENCOUNTER — Other Ambulatory Visit: Payer: Self-pay

## 2024-10-11 ENCOUNTER — Other Ambulatory Visit (HOSPITAL_COMMUNITY): Payer: Self-pay

## 2024-10-11 NOTE — Progress Notes (Addendum)
 Specialty Pharmacy Refill Coordination Note  Spoke with Harjot Dibello is a 78 y.o. male contacted today regarding refills of specialty medication(s) Venetoclax  (VENCLEXTA )  Doses on hand: 3 (6 tabs)  Patient requested: Delivery   Delivery date: 10/13/24   Verified address: [address] [place], [place] [address]  Medication will be filled on 10/12/24

## 2024-10-18 ENCOUNTER — Inpatient Hospital Stay: Attending: Internal Medicine

## 2024-10-18 ENCOUNTER — Inpatient Hospital Stay

## 2024-10-18 ENCOUNTER — Inpatient Hospital Stay (HOSPITAL_BASED_OUTPATIENT_CLINIC_OR_DEPARTMENT_OTHER): Admitting: Internal Medicine

## 2024-10-18 VITALS — BP 152/85 | HR 69 | Temp 98.5°F | Resp 17

## 2024-10-18 VITALS — BP 140/82 | HR 69 | Temp 98.2°F | Resp 17 | Ht 65.0 in | Wt 165.0 lb

## 2024-10-18 DIAGNOSIS — Z85048 Personal history of other malignant neoplasm of rectum, rectosigmoid junction, and anus: Secondary | ICD-10-CM | POA: Diagnosis not present

## 2024-10-18 DIAGNOSIS — Z5111 Encounter for antineoplastic chemotherapy: Secondary | ICD-10-CM | POA: Diagnosis present

## 2024-10-18 DIAGNOSIS — C9110 Chronic lymphocytic leukemia of B-cell type not having achieved remission: Secondary | ICD-10-CM | POA: Insufficient documentation

## 2024-10-18 DIAGNOSIS — Z79899 Other long term (current) drug therapy: Secondary | ICD-10-CM | POA: Diagnosis not present

## 2024-10-18 LAB — CBC WITH DIFFERENTIAL (CANCER CENTER ONLY)
Abs Immature Granulocytes: 0.03 K/uL (ref 0.00–0.07)
Basophils Absolute: 0 K/uL (ref 0.0–0.1)
Basophils Relative: 1 %
Eosinophils Absolute: 0.1 K/uL (ref 0.0–0.5)
Eosinophils Relative: 1 %
HCT: 37.7 % — ABNORMAL LOW (ref 39.0–52.0)
Hemoglobin: 13 g/dL (ref 13.0–17.0)
Immature Granulocytes: 1 %
Lymphocytes Relative: 26 %
Lymphs Abs: 1.1 K/uL (ref 0.7–4.0)
MCH: 33.6 pg (ref 26.0–34.0)
MCHC: 34.5 g/dL (ref 30.0–36.0)
MCV: 97.4 fL (ref 80.0–100.0)
Monocytes Absolute: 0.4 K/uL (ref 0.1–1.0)
Monocytes Relative: 10 %
Neutro Abs: 2.7 K/uL (ref 1.7–7.7)
Neutrophils Relative %: 61 %
Platelet Count: 130 K/uL — ABNORMAL LOW (ref 150–400)
RBC: 3.87 MIL/uL — ABNORMAL LOW (ref 4.22–5.81)
RDW: 12.1 % (ref 11.5–15.5)
WBC Count: 4.4 K/uL (ref 4.0–10.5)
nRBC: 0 % (ref 0.0–0.2)

## 2024-10-18 LAB — CMP (CANCER CENTER ONLY)
ALT: 12 U/L (ref 0–44)
AST: 26 U/L (ref 15–41)
Albumin: 4.3 g/dL (ref 3.5–5.0)
Alkaline Phosphatase: 61 U/L (ref 38–126)
Anion gap: 9 (ref 5–15)
BUN: 13 mg/dL (ref 8–23)
CO2: 27 mmol/L (ref 22–32)
Calcium: 9.1 mg/dL (ref 8.9–10.3)
Chloride: 106 mmol/L (ref 98–111)
Creatinine: 0.83 mg/dL (ref 0.61–1.24)
GFR, Estimated: >60 mL/min
Glucose, Bld: 95 mg/dL (ref 70–99)
Potassium: 3.7 mmol/L (ref 3.5–5.1)
Sodium: 142 mmol/L (ref 135–145)
Total Bilirubin: 0.9 mg/dL (ref 0.0–1.2)
Total Protein: 6.5 g/dL (ref 6.5–8.1)

## 2024-10-18 LAB — URIC ACID: Uric Acid, Serum: 5.1 mg/dL (ref 3.7–8.6)

## 2024-10-18 LAB — LACTATE DEHYDROGENASE: LDH: 181 U/L (ref 105–235)

## 2024-10-18 MED ORDER — FAMOTIDINE IN NACL 20-0.9 MG/50ML-% IV SOLN
20.0000 mg | Freq: Once | INTRAVENOUS | Status: AC
  Administered 2024-10-18: 20 mg via INTRAVENOUS
  Filled 2024-10-18: qty 50

## 2024-10-18 MED ORDER — METHYLPREDNISOLONE SODIUM SUCC 125 MG IJ SOLR
125.0000 mg | Freq: Once | INTRAMUSCULAR | Status: AC
  Administered 2024-10-18: 125 mg via INTRAVENOUS
  Filled 2024-10-18: qty 2

## 2024-10-18 MED ORDER — ACETAMINOPHEN 325 MG PO TABS
650.0000 mg | ORAL_TABLET | Freq: Once | ORAL | Status: AC
  Administered 2024-10-18: 650 mg via ORAL
  Filled 2024-10-18: qty 2

## 2024-10-18 MED ORDER — CETIRIZINE HCL 10 MG/ML IV SOLN
10.0000 mg | Freq: Once | INTRAVENOUS | Status: AC
  Administered 2024-10-18: 10 mg via INTRAVENOUS
  Filled 2024-10-18: qty 1

## 2024-10-18 MED ORDER — SODIUM CHLORIDE 0.9 % IV SOLN: INTRAVENOUS | Status: DC

## 2024-10-18 MED ORDER — SODIUM CHLORIDE 0.9 % IV SOLN
1000.0000 mg | Freq: Once | INTRAVENOUS | Status: AC
  Administered 2024-10-18: 1000 mg via INTRAVENOUS
  Filled 2024-10-18: qty 40

## 2024-10-18 NOTE — Patient Instructions (Signed)
 [facility] - A DEPT OF [facility]  Discharge Instructions: Thank you for choosing [facility] to provide your oncology and hematology care.   If you have a lab appointment with the Cancer Center, please go directly to the Cancer Center and check in at the registration area.   Wear comfortable clothing and clothing appropriate for easy access to any Portacath or PICC line.   We strive to give you quality time with your provider. You may need to reschedule your appointment if you arrive late (15 or more minutes).  Arriving late affects you and other patients whose appointments are after yours.  Also, if you miss three or more appointments without notifying the office, you may be dismissed from the clinic at the provider's discretion.      For prescription refill requests, have your pharmacy contact our office and allow 72 hours for refills to be completed.    Today you received the following chemotherapy and/or immunotherapy agents Gazyva   To help prevent nausea and vomiting after your treatment, we encourage you to take your nausea medication as directed.  BELOW ARE SYMPTOMS THAT SHOULD BE REPORTED IMMEDIATELY: *FEVER GREATER THAN 100.4 F (38 C) OR HIGHER *CHILLS OR SWEATING *NAUSEA AND VOMITING THAT IS NOT CONTROLLED WITH YOUR NAUSEA MEDICATION *UNUSUAL SHORTNESS OF BREATH *UNUSUAL BRUISING OR BLEEDING *URINARY PROBLEMS (pain or burning when urinating, or frequent urination) *BOWEL PROBLEMS (unusual diarrhea, constipation, pain near the anus) TENDERNESS IN MOUTH AND THROAT WITH OR WITHOUT PRESENCE OF ULCERS (sore throat, sores in mouth, or a toothache) UNUSUAL RASH, SWELLING OR PAIN  UNUSUAL VAGINAL DISCHARGE OR ITCHING   Items with * indicate a potential emergency and should be followed up as soon as possible or go to the Emergency Department if any problems should occur.  Please show the CHEMOTHERAPY ALERT CARD or IMMUNOTHERAPY ALERT  CARD at check-in to the Emergency Department and triage nurse.  Should you have questions after your visit or need to cancel or reschedule your appointment, please contact [facility] - A DEPT OF [facility]  Dept: [phone]  and follow the prompts.  Office hours are 8:00 a.m. to 4:30 p.m. Monday - Friday. Please note that voicemails left after 4:00 p.m. may not be returned until the following business day.  We are closed weekends and major holidays. You have access to a nurse at all times for urgent questions. Please call the main number to the clinic Dept: [phone] and follow the prompts.   For any non-urgent questions, you may also contact your provider using MyChart. We now offer e-Visits for anyone 76 and older to request care online for non-urgent symptoms. For details visit [email or web address].   Also download the MyChart app! Go to the app store, search "MyChart", open the app, select [place], and log in with your MyChart username and password.

## 2024-10-18 NOTE — Progress Notes (Signed)
 "     [facility] [phone]   [phone]  OFFICE PROGRESS NOTE  [name], MD [address] [place] [address]  DIAGNOSIS:  1) History of rectal cancer in 2007 status post concurrent chemoradiation followed by surgery. 2) Chronic lymphocytic leukemia diagnosed in 2007.  PRIOR THERAPY:  1) Ibrutinib  420 mg p.o. daily first 2007 and discontinued August 26, 2023 secondary to disease progression. 2) Acalabrutinib  100 mg p.o. twice daily.  First dose started August 28, 2023.  This discontinued and August 2025 secondary to disease progression and rising total white blood count with anemia and thrombocytopenia.  CURRENT THERAPY: Infusional obinutuzumab  in addition to venetoclax .  Status post 4 cycles.  INTERVAL HISTORY: Aaron Gonzalez 78 y.o. male returns to the clinic today for follow-up visit. Discussed the use of AI scribe software for clinical note transcription with the patient, who gave verbal consent to proceed.  History of Present Illness Aaron Gonzalez is a 78 year old male with chronic lymphocytic leukemia who presents for evaluation prior to cycle five of obinutuzumab  and venetoclax  therapy.  The patient has a history of chronic lymphocytic leukemia diagnosed in 2007 and was treated with ibrutinib  for approximately 17 years until November 2024, at which point therapy was discontinued. He subsequently received acalabrutinib  for eight months, which was discontinued. He is currently receiving obinutuzumab  and venetoclax , having completed four cycles of obinutuzumab  and continuing daily venetoclax .  He denies any physical complaints, including chest pain, dyspnea, nausea, vomiting, diarrhea, or adverse effects from his current treatment regimen. He reports feeling well overall.    MEDICAL HISTORY: Past Medical History:  Diagnosis Date   Arthritis    CLL (chronic lymphocytic leukemia) (HCC)    DVT (deep venous thrombosis) (HCC)     History of rectal cancer    Hypertension    Stroke (HCC)     ALLERGIES:  is allergic to gazyva  [obinutuzumab ].  MEDICATIONS:  Current Outpatient Medications  Medication Sig Dispense Refill   acetaminophen  (TYLENOL ) 325 MG tablet Take 2 tablets (650 mg total) by mouth every 6 (six) hours as needed for mild pain (pain score 1-3) or fever (or Fever >/= 101). 20 tablet 0   acyclovir  (ZOVIRAX ) 400 MG tablet Take 1 tablet (400 mg total) by mouth daily. 30 tablet 5   allopurinol  (ZYLOPRIM ) 300 MG tablet Take 1 tablet (300 mg total) by mouth daily. 30 tablet 0   amLODipine  (NORVASC ) 10 MG tablet Take 1 tablet by mouth daily.     blood glucose meter kit and supplies KIT Dispense based on patient and insurance preference. Use up to four times daily as directed. 1 each 0   carvedilol  (COREG ) 6.25 MG tablet TAKE 1 TABLET(6.25 MG) BY MOUTH TWICE DAILY 180 tablet 3   furosemide  (LASIX ) 20 MG tablet Take 20 mg by mouth daily as needed.     lidocaine -prilocaine  (EMLA ) cream Apply 1 Application topically as needed. 30 g 2   ondansetron  (ZOFRAN ) 8 MG tablet Take 1 tablet (8 mg total) by mouth every 8 (eight) hours as needed for nausea or vomiting. 30 tablet 1   prochlorperazine  (COMPAZINE ) 10 MG tablet Take 1 tablet (10 mg total) by mouth every 6 (six) hours as needed for nausea or vomiting. 30 tablet 1   senna-docusate (SENOKOT-S) 8.6-50 MG tablet Take 1 tablet by mouth at bedtime as needed for mild constipation. 30 tablet 0   venetoclax  (VENCLEXTA ) 100 MG tablet Take 2 tablets (200 mg total) by  mouth daily. 60 tablet 0   No current facility-administered medications for this visit.    SURGICAL HISTORY:  Past Surgical History:  Procedure Laterality Date   COLECTOMY     with colostomy   IR IMAGING GUIDED PORT INSERTION  08/06/2024   TOTAL HIP ARTHROPLASTY Left 10/26/2021   Procedure: Left TOTAL HIP ARTHROPLASTY ANTERIOR APPROACH;  Surgeon: Vernetta Lonni GRADE, MD;  Location: [facility];  Service:  Orthopedics;  Laterality: Left;  RNFA    REVIEW OF SYSTEMS:  A comprehensive review of systems was negative except for: Constitutional: positive for fatigue   PHYSICAL EXAMINATION: General appearance: alert, cooperative, fatigued, and no distress Head: Normocephalic, without obvious abnormality, atraumatic Neck: no adenopathy, no JVD, supple, symmetrical, trachea midline, and thyroid not enlarged, symmetric, no tenderness/mass/nodules Lymph nodes: Cervical, supraclavicular, and axillary nodes normal. Resp: clear to auscultation bilaterally Back: symmetric, no curvature. ROM normal. No CVA tenderness. Cardio: regular rate and rhythm, S1, S2 normal, no murmur, click, rub or gallop GI: soft, non-tender; bowel sounds normal; no masses,  no organomegaly Extremities: edema 2+ edema bilaterally  ECOG PERFORMANCE STATUS: 1 - Symptomatic but completely ambulatory  Blood pressure (!) 140/82, pulse 69, temperature 98.2 F (36.8 C), temperature source Temporal, resp. rate 17, height 5' 5 (1.651 m), weight 165 lb (74.8 kg), SpO2 96%.  LABORATORY DATA: Lab Results  Component Value Date   WBC 4.4 10/18/2024   HGB 13.0 10/18/2024   HCT 37.7 (L) 10/18/2024   MCV 97.4 10/18/2024   PLT 130 (L) 10/18/2024      Chemistry      Component Value Date/Time   NA 141 09/13/2024 1434   NA 139 06/28/2021 0954   K 3.8 09/13/2024 1434   CL 104 09/13/2024 1434   CO2 29 09/13/2024 1434   BUN 15 09/13/2024 1434   BUN 11 06/28/2021 0954   CREATININE 0.85 09/13/2024 1434      Component Value Date/Time   CALCIUM  9.5 09/13/2024 1434   ALKPHOS 57 09/13/2024 1434   AST 22 09/13/2024 1434   ALT 13 09/13/2024 1434   BILITOT 1.0 09/13/2024 1434       RADIOGRAPHIC STUDIES: No results found.   ASSESSMENT AND PLAN: This is a very pleasant 78 years old African-American male with history of rectal cancer diagnosed in 2007 status post concurrent chemoradiation followed by surgical resection.  At the same time  the patient was diagnosed with chronic lymphocytic leukemia and has been in observation initially for several years but started on treatment with ibrutinib  420 mg in 2019.  He tolerated his treatment well until November 2024 when he had evidence for disease progression.  We switched his treatment to acalabrutinib  100 mg p.o. twice daily started August 28, 2023 and he has been tolerating it fairly well. CBC today showed continuous increase in the total white blood count suspicious for disease progression. He is currently undergoing treatment with obinutuzumab  and oral venetoclax .  Status post 4 cycles. Assessment and Plan Assessment & Plan Chronic lymphocytic leukemia Indolent B-cell malignancy diagnosed in 2007. Previously treated with ibrutinib  and acalabrutinib , both discontinued due to disease progression. Currently receiving obinutuzumab  and venetoclax ; he has completed four cycles without adverse effects or new symptoms. - Administer cycle five of obinutuzumab  infusion today. - Continue daily venetoclax . - Schedule follow-up in four weeks. The patient was advised to call immediately if he has any concerning symptoms in the interval.  The patient voices understanding of current disease status and treatment options and is in agreement  with the current care plan. All questions were answered. The patient knows to call the clinic with any problems, questions or concerns. We can certainly see the patient much sooner if necessary. The total time spent in the appointment was 20 minutes including review of chart and various tests results, discussions about plan of care and coordination of care plan .  Disclaimer: This note was dictated with voice recognition software. Similar sounding words can inadvertently be transcribed and may not be corrected upon review.        "

## 2024-10-19 ENCOUNTER — Other Ambulatory Visit: Payer: Self-pay

## 2024-10-19 NOTE — Progress Notes (Signed)
 Specialty Pharmacy Ongoing Clinical Assessment Note  Aaron Gonzalez is a 78 y.o. male who is being followed by the specialty pharmacy service for RxSp Oncology   Patient's specialty medication(s) reviewed today: Venetoclax  (VENCLEXTA )   Missed doses in the last 4 weeks: 0   Patient/Caregiver did not have any additional questions or concerns.   Therapeutic benefit summary: Patient is achieving benefit   Adverse events/side effects summary: No adverse events/side effects   Patient's therapy is appropriate to: Continue    Goals Addressed             This Visit's Progress    Slow Disease Progression   On track    Patient is on track. Patient will maintain adherence. Per OV from 10/18/24, patient remains stable on therapy.         Follow up: 3 months  Silvano LOISE Dolly Specialty Pharmacist

## 2024-11-02 ENCOUNTER — Other Ambulatory Visit: Payer: Self-pay | Admitting: Physician Assistant

## 2024-11-02 ENCOUNTER — Other Ambulatory Visit: Payer: Self-pay

## 2024-11-02 DIAGNOSIS — C9110 Chronic lymphocytic leukemia of B-cell type not having achieved remission: Secondary | ICD-10-CM

## 2024-11-03 ENCOUNTER — Other Ambulatory Visit: Payer: Self-pay

## 2024-11-03 MED ORDER — VENETOCLAX 100 MG PO TABS
200.0000 mg | ORAL_TABLET | Freq: Every day | ORAL | 0 refills | Status: DC
  Filled 2024-11-03: qty 56, 28d supply, fill #0
  Filled 2024-11-03: qty 84, 42d supply, fill #0

## 2024-11-03 NOTE — Progress Notes (Signed)
 Specialty Pharmacy Refill Coordination Note  Aaron Gonzalez is a 79 y.o. male contacted today regarding refills of specialty medication(s) Venetoclax  (VENCLEXTA )   Patient requested Delivery   Delivery date: 11/05/24   Verified address: [address] [place], [place] [address]   Medication will be filled on: 11/04/24

## 2024-11-04 ENCOUNTER — Other Ambulatory Visit: Payer: Self-pay

## 2024-11-10 NOTE — Progress Notes (Unsigned)
 [facility] HEMATOLOGY-ONCOLOGY TeleHEALTH VISIT PROGRESS NOTE   I connected with Aaron Gonzalez on 11/15/24 at  3:00 PM EST by telephone and verified that I am speaking with the correct person using two identifiers.  I discussed the limitations, risks, security and privacy concerns of performing an evaluation and management service by telemedicine and the availability of in-person appointments. I also discussed with the patient that there may be a patient responsible charge related to this service. The patient expressed understanding and agreed to proceed.  Other persons participating in the visit and their role in the encounter: None  Patients location: Home  Providers location: Office  Aaron Leni Edyth DELENA, MD [address] [place] [address]  [facility] OFFICE PROGRESS NOTE  Aaron Leni Edyth DELENA, MD [address] [place] [address]  DIAGNOSIS:  1) History of rectal cancer in 2007 status post concurrent chemoradiation followed by surgery. 2) Chronic lymphocytic leukemia diagnosed in 2007.   PRIOR THERAPY: 1) Ibrutinib  420 mg p.o. daily first 2007 and discontinued August 26, 2023 secondary to disease progression. 2) Acalabrutinib  100 mg p.o. twice daily.  First dose started August 28, 2023.  This discontinued and August 2025 secondary to disease progression and rising total white blood count with anemia and thrombocytopenia.  CURRENT THERAPY:  Infusional obinutuzumab  in addition to venetoclax . First dose on 06/25/24. Venetoclax  started on 07/15/24. Status post 5 cycles.   INTERVAL HISTORY: Aaron Gonzalez 79 y.o. male rand I connected via telephone call today.  follow-up visit. The patient was last seen in clinic by Dr. Sherrod on 10/18/24.   The patient was started on treatment for progressive CLL the week on 06/25/2024.  He is undergoing treatment with obinutuzumab .     He also started on treatment with venetoclax  on 07/15/2024.  Thus far he has been  tolerating it without adverse side effects. ***Make sure he is still taking this and taking correctly.   He denies any abnormal bleeding or bruising. He denies any signs and symptoms of infection including fever, nasal congestion, sore throat, skin infections, burning with urination, dysuria, diarrhea, or abdominal pain. He denies any shortness of breath, night sweats, chest pain,  or lymphadenopathy.  He has stable to improve lower extremity swelling.  He sometimes wears compression stockings. He is here today for evaluation and repeat blood work. He is here for his last and 6th infusion of obinutuzumab .   MEDICAL HISTORY: Past Medical History:  Diagnosis Date   Arthritis    CLL (chronic lymphocytic leukemia) (HCC)    DVT (deep venous thrombosis) (HCC)    History of rectal cancer    Hypertension    Stroke (HCC)     ALLERGIES:  is allergic to gazyva  [obinutuzumab ].  MEDICATIONS:  Current Outpatient Medications  Medication Sig Dispense Refill   acetaminophen  (TYLENOL ) 325 MG tablet Take 2 tablets (650 mg total) by mouth every 6 (six) hours as needed for mild pain (pain score 1-3) or fever (or Fever >/= 101). 20 tablet 0   acyclovir  (ZOVIRAX ) 400 MG tablet Take 1 tablet (400 mg total) by mouth daily. 30 tablet 5   allopurinol  (ZYLOPRIM ) 300 MG tablet Take 1 tablet (300 mg total) by mouth daily. 30 tablet 0   amLODipine  (NORVASC ) 10 MG tablet Take 1 tablet by mouth daily.     blood glucose meter kit and supplies KIT Dispense based on patient and insurance preference. Use up to four times daily as directed. 1 each 0  carvedilol  (COREG ) 6.25 MG tablet TAKE 1 TABLET(6.25 MG) BY MOUTH TWICE DAILY 180 tablet 3   furosemide  (LASIX ) 20 MG tablet Take 20 mg by mouth daily as needed.     lidocaine -prilocaine  (EMLA ) cream Apply 1 Application topically as needed. 30 g 2   ondansetron  (ZOFRAN ) 8 MG tablet Take 1 tablet (8 mg total) by mouth every 8 (eight) hours as needed for nausea or vomiting. 30  tablet 1   prochlorperazine  (COMPAZINE ) 10 MG tablet Take 1 tablet (10 mg total) by mouth every 6 (six) hours as needed for nausea or vomiting. 30 tablet 1   senna-docusate (SENOKOT-S) 8.6-50 MG tablet Take 1 tablet by mouth at bedtime as needed for mild constipation. 30 tablet 0   venetoclax  (VENCLEXTA ) 100 MG tablet Take 2 tablets (200 mg total) by mouth daily. 60 tablet 0   No current facility-administered medications for this visit.    SURGICAL HISTORY:  Past Surgical History:  Procedure Laterality Date   COLECTOMY     with colostomy   IR IMAGING GUIDED PORT INSERTION  08/06/2024   TOTAL HIP ARTHROPLASTY Left 10/26/2021   Procedure: Left TOTAL HIP ARTHROPLASTY ANTERIOR APPROACH;  Surgeon: Vernetta Lonni GRADE, MD;  Location: [facility];  Service: Orthopedics;  Laterality: Left;  RNFA    REVIEW OF SYSTEMS:   Review of Systems  Constitutional: Negative for appetite change, chills, fatigue, fever and unexpected weight change.  HENT:   Negative for mouth sores, nosebleeds, sore throat and trouble swallowing.   Eyes: Negative for eye problems and icterus.  Respiratory: Negative for cough, hemoptysis, shortness of breath and wheezing.   Cardiovascular: Negative for chest pain and leg swelling.  Gastrointestinal: Negative for abdominal pain, constipation, diarrhea, nausea and vomiting.  Genitourinary: Negative for bladder incontinence, difficulty urinating, dysuria, frequency and hematuria.   Musculoskeletal: Negative for back pain, gait problem, neck pain and neck stiffness.  Skin: Negative for itching and rash.  Neurological: Negative for dizziness, extremity weakness, gait problem, headaches, light-headedness and seizures.  Hematological: Negative for adenopathy. Does not bruise/bleed easily.  Psychiatric/Behavioral: Negative for confusion, depression and sleep disturbance. The patient is not nervous/anxious.     PHYSICAL EXAMINATION:  There were no vitals taken for this  visit.  ECOG PERFORMANCE STATUS: {CHL ONC ECOG [id number]  Physical Exam  Constitutional: Oriented to person, place, and time and well-developed, well-nourished, and in no distress. No distress.  HENT:  Head: Normocephalic and atraumatic.  Mouth/Throat: Oropharynx is clear and moist. No oropharyngeal exudate.  Eyes: Conjunctivae are normal. Right eye exhibits no discharge. Left eye exhibits no discharge. No scleral icterus.  Neck: Normal range of motion. Neck supple.  Cardiovascular: Normal rate, regular rhythm, normal heart sounds and intact distal pulses.   Pulmonary/Chest: Effort normal and breath sounds normal. No respiratory distress. No wheezes. No rales.  Abdominal: Soft. Bowel sounds are normal. Exhibits no distension and no mass. There is no tenderness.  Musculoskeletal: Normal range of motion. Exhibits no edema.  Lymphadenopathy:    No cervical adenopathy.  Neurological: Alert and oriented to person, place, and time. Exhibits normal muscle tone. Gait normal. Coordination normal.  Skin: Skin is warm and dry. No rash noted. Not diaphoretic. No erythema. No pallor.  Psychiatric: Mood, memory and judgment normal.  Vitals reviewed.  LABORATORY DATA: Lab Results  Component Value Date   WBC 3.4 (L) 11/13/2024   HGB 14.1 11/13/2024   HCT 40.7 11/13/2024   MCV 96.7 11/13/2024   PLT 153 11/13/2024  Chemistry      Component Value Date/Time   NA 142 11/13/2024 0929   NA 139 06/28/2021 0954   K 3.8 11/13/2024 0929   CL 105 11/13/2024 0929   CO2 27 11/13/2024 0929   BUN 11 11/13/2024 0929   BUN 11 06/28/2021 0954   CREATININE 0.97 11/13/2024 0929      Component Value Date/Time   CALCIUM  9.6 11/13/2024 0929   ALKPHOS 65 11/13/2024 0929   AST 28 11/13/2024 0929   ALT 19 11/13/2024 0929   BILITOT 1.2 11/13/2024 0929       RADIOGRAPHIC STUDIES:  No results found.   ASSESSMENT/PLAN:  This is a very pleasant 79 year old African-American male with a history of  rectal cancer in 2007. He is status post concurrent chemoradiation followed by surgical resection. At the same time he was diagnosed with chronic lymphocytic leukemia and he has been on observation initially for several years which was started on treatment with ibrutinib  420 mg in 2019.    He was found to have evidence of disease progression in early November 2024. Therefore he was started on acalabrutinib  around 08/28/2023.  This was discontinued in August 2025 due to disease progression.   Therefore Dr. Sherrod started the patient on treatment with venetoclax  and Obinutuzumab .  His first ifusion was on 06/25/24. He started venetoclax  around 07/15/24.  He is currently on 200 mg of venetoclax    Labs were reviewed. His WBC is ***. His Hbg is ***. His platelet count is ***k.    He will continue on the same treatment at the same dose.    ***venotoclax   Peripheral edema - Advise wearing compression stockings during the day. - Advised to elevate    We will see him back for labs and follow up in 4 weeks.    I discussed the assessment and treatment plan with the patient. The patient was provided an opportunity to ask questions and all were answered. The patient agreed with the plan and demonstrated an understanding of the instructions.  The patient was advised to call back or seek an in-person evaluation if the symptoms worsen or if the condition fails to improve as anticipated.  I provided *** minutes of {Blank single:19197::face-to-face video visit time,non face-to-face telephone visit time} during this encounter, and > 50% was spent counseling as documented under my assessment & plan.  Hampton Cost L Jatavion Peaster, PA-C 11/15/2024 11:06 AM  No orders of the defined types were placed in this encounter.    Sheelah Ritacco L Crandall Harvel, PA-C 11/15/24

## 2024-11-12 ENCOUNTER — Telehealth: Payer: Self-pay | Admitting: Physician Assistant

## 2024-11-12 NOTE — Telephone Encounter (Signed)
 I called the patient to confirm the change in plan for Monday due to the weather.

## 2024-11-12 NOTE — Telephone Encounter (Signed)
 I called pt to see about moving upcoming appt to sooner

## 2024-11-13 ENCOUNTER — Inpatient Hospital Stay: Attending: Internal Medicine

## 2024-11-13 DIAGNOSIS — Z79899 Other long term (current) drug therapy: Secondary | ICD-10-CM | POA: Diagnosis not present

## 2024-11-13 DIAGNOSIS — C9110 Chronic lymphocytic leukemia of B-cell type not having achieved remission: Secondary | ICD-10-CM | POA: Diagnosis present

## 2024-11-13 DIAGNOSIS — Z7969 Long term (current) use of other immunomodulators and immunosuppressants: Secondary | ICD-10-CM | POA: Diagnosis not present

## 2024-11-13 DIAGNOSIS — Z923 Personal history of irradiation: Secondary | ICD-10-CM | POA: Insufficient documentation

## 2024-11-13 DIAGNOSIS — Z85048 Personal history of other malignant neoplasm of rectum, rectosigmoid junction, and anus: Secondary | ICD-10-CM | POA: Diagnosis not present

## 2024-11-13 DIAGNOSIS — D696 Thrombocytopenia, unspecified: Secondary | ICD-10-CM | POA: Insufficient documentation

## 2024-11-13 DIAGNOSIS — Z5111 Encounter for antineoplastic chemotherapy: Secondary | ICD-10-CM | POA: Diagnosis present

## 2024-11-13 DIAGNOSIS — Z86718 Personal history of other venous thrombosis and embolism: Secondary | ICD-10-CM | POA: Diagnosis not present

## 2024-11-13 DIAGNOSIS — Z933 Colostomy status: Secondary | ICD-10-CM | POA: Diagnosis not present

## 2024-11-13 LAB — CMP (CANCER CENTER ONLY)
ALT: 19 U/L (ref 10–47)
AST: 28 U/L (ref 15–41)
Albumin: 4.4 g/dL (ref 3.5–5.0)
Alkaline Phosphatase: 65 U/L (ref 38–126)
Anion gap: 10 (ref 5–15)
BUN: 11 mg/dL (ref 8–23)
CO2: 27 mmol/L (ref 22–32)
Calcium: 9.6 mg/dL (ref 8.9–10.3)
Chloride: 105 mmol/L (ref 98–111)
Creatinine: 0.97 mg/dL (ref 0.60–1.20)
GFR, Estimated: >60 mL/min
Glucose, Bld: 92 mg/dL (ref 70–99)
Potassium: 3.8 mmol/L (ref 3.5–5.1)
Sodium: 142 mmol/L (ref 135–145)
Total Bilirubin: 1.2 mg/dL (ref 0.0–1.2)
Total Protein: 6.8 g/dL (ref 6.5–8.1)

## 2024-11-13 LAB — URIC ACID: Uric Acid, Serum: 4.4 mg/dL (ref 3.7–8.6)

## 2024-11-13 LAB — LACTATE DEHYDROGENASE: LDH: 215 U/L (ref 105–235)

## 2024-11-14 LAB — CBC WITH DIFFERENTIAL (CANCER CENTER ONLY)
Abs Immature Granulocytes: 0 10*3/uL (ref 0.00–0.07)
Basophils Absolute: 0 10*3/uL (ref 0.0–0.1)
Basophils Relative: 1 %
Eosinophils Absolute: 0 10*3/uL (ref 0.0–0.5)
Eosinophils Relative: 1 %
HCT: 40.7 % (ref 39.0–52.0)
Hemoglobin: 14.1 g/dL (ref 13.0–17.0)
Immature Granulocytes: 0 %
Lymphocytes Relative: 31 %
Lymphs Abs: 1 10*3/uL (ref 0.7–4.0)
MCH: 33.5 pg (ref 26.0–34.0)
MCHC: 34.6 g/dL (ref 30.0–36.0)
MCV: 96.7 fL (ref 80.0–100.0)
Monocytes Absolute: 0.6 10*3/uL (ref 0.1–1.0)
Monocytes Relative: 17 %
Neutro Abs: 1.7 10*3/uL (ref 1.7–7.7)
Neutrophils Relative %: 50 %
Platelet Count: 153 10*3/uL (ref 150–400)
RBC: 4.21 MIL/uL — ABNORMAL LOW (ref 4.22–5.81)
RDW: 12.5 % (ref 11.5–15.5)
WBC Count: 3.4 10*3/uL — ABNORMAL LOW (ref 4.0–10.5)
nRBC: 0 % (ref 0.0–0.2)

## 2024-11-15 ENCOUNTER — Inpatient Hospital Stay

## 2024-11-15 ENCOUNTER — Inpatient Hospital Stay: Admitting: Physician Assistant

## 2024-11-15 ENCOUNTER — Telehealth: Payer: Self-pay | Admitting: Physician Assistant

## 2024-11-15 NOTE — Telephone Encounter (Signed)
 I spoke to the patient on Friday and let him know I would call him today between 11:30-12 to review his results and for a telephone visit. I called him several times today. His voicemail is not set up and I am unable to leave a message. I was unable to reach him. I will reach out to scheduling to reschedule a telephone visit this week. His infusion is scheduled for Friday. Once I speak with him, I will make sure he knows his new arrival time for his appointment this week.

## 2024-11-16 ENCOUNTER — Inpatient Hospital Stay: Admitting: Physician Assistant

## 2024-11-16 ENCOUNTER — Inpatient Hospital Stay

## 2024-11-16 VITALS — BP 151/89 | HR 75 | Temp 98.3°F | Resp 18 | Wt 167.2 lb

## 2024-11-16 DIAGNOSIS — C9110 Chronic lymphocytic leukemia of B-cell type not having achieved remission: Secondary | ICD-10-CM | POA: Diagnosis not present

## 2024-11-16 DIAGNOSIS — Z5111 Encounter for antineoplastic chemotherapy: Secondary | ICD-10-CM | POA: Diagnosis not present

## 2024-11-16 MED ORDER — FAMOTIDINE IN NACL 20-0.9 MG/50ML-% IV SOLN
20.0000 mg | Freq: Once | INTRAVENOUS | Status: AC
  Administered 2024-11-16: 20 mg via INTRAVENOUS
  Filled 2024-11-16: qty 50

## 2024-11-16 MED ORDER — SODIUM CHLORIDE 0.9 % IV SOLN
1000.0000 mg | Freq: Once | INTRAVENOUS | Status: AC
  Administered 2024-11-16: 1000 mg via INTRAVENOUS
  Filled 2024-11-16: qty 40

## 2024-11-16 MED ORDER — SODIUM CHLORIDE 0.9 % IV SOLN: INTRAVENOUS | Status: DC

## 2024-11-16 MED ORDER — SODIUM CHLORIDE 0.9% FLUSH: 10.0000 mL | INTRAVENOUS | Status: DC | PRN

## 2024-11-16 MED ORDER — METHYLPREDNISOLONE SODIUM SUCC 125 MG IJ SOLR
125.0000 mg | Freq: Once | INTRAMUSCULAR | Status: AC
  Administered 2024-11-16: 125 mg via INTRAVENOUS
  Filled 2024-11-16: qty 2

## 2024-11-16 MED ORDER — CETIRIZINE HCL 10 MG/ML IV SOLN
10.0000 mg | Freq: Once | INTRAVENOUS | Status: AC
  Administered 2024-11-16: 10 mg via INTRAVENOUS
  Filled 2024-11-16: qty 1

## 2024-11-16 MED ORDER — ACETAMINOPHEN 325 MG PO TABS
650.0000 mg | ORAL_TABLET | Freq: Once | ORAL | Status: AC
  Administered 2024-11-16: 650 mg via ORAL
  Filled 2024-11-16: qty 2

## 2024-11-16 NOTE — Patient Instructions (Signed)
 [facility] - A DEPT OF [facility]  Discharge Instructions: Thank you for choosing [facility] to provide your oncology and hematology care.   If you have a lab appointment with the Cancer Center, please go directly to the Cancer Center and check in at the registration area.   Wear comfortable clothing and clothing appropriate for easy access to any Portacath or PICC line.   We strive to give you quality time with your provider. You may need to reschedule your appointment if you arrive late (15 or more minutes).  Arriving late affects you and other patients whose appointments are after yours.  Also, if you miss three or more appointments without notifying the office, you may be dismissed from the clinic at the provider's discretion.      For prescription refill requests, have your pharmacy contact our office and allow 72 hours for refills to be completed.    Today you received the following chemotherapy and/or immunotherapy agents Gazyva   To help prevent nausea and vomiting after your treatment, we encourage you to take your nausea medication as directed.  BELOW ARE SYMPTOMS THAT SHOULD BE REPORTED IMMEDIATELY: *FEVER GREATER THAN 100.4 F (38 C) OR HIGHER *CHILLS OR SWEATING *NAUSEA AND VOMITING THAT IS NOT CONTROLLED WITH YOUR NAUSEA MEDICATION *UNUSUAL SHORTNESS OF BREATH *UNUSUAL BRUISING OR BLEEDING *URINARY PROBLEMS (pain or burning when urinating, or frequent urination) *BOWEL PROBLEMS (unusual diarrhea, constipation, pain near the anus) TENDERNESS IN MOUTH AND THROAT WITH OR WITHOUT PRESENCE OF ULCERS (sore throat, sores in mouth, or a toothache) UNUSUAL RASH, SWELLING OR PAIN  UNUSUAL VAGINAL DISCHARGE OR ITCHING   Items with * indicate a potential emergency and should be followed up as soon as possible or go to the Emergency Department if any problems should occur.  Please show the CHEMOTHERAPY ALERT CARD or IMMUNOTHERAPY ALERT  CARD at check-in to the Emergency Department and triage nurse.  Should you have questions after your visit or need to cancel or reschedule your appointment, please contact [facility] - A DEPT OF [facility]  Dept: [phone]  and follow the prompts.  Office hours are 8:00 a.m. to 4:30 p.m. Monday - Friday. Please note that voicemails left after 4:00 p.m. may not be returned until the following business day.  We are closed weekends and major holidays. You have access to a nurse at all times for urgent questions. Please call the main number to the clinic Dept: [phone] and follow the prompts.   For any non-urgent questions, you may also contact your provider using MyChart. We now offer e-Visits for anyone 76 and older to request care online for non-urgent symptoms. For details visit [email or web address].   Also download the MyChart app! Go to the app store, search "MyChart", open the app, select [place], and log in with your MyChart username and password.

## 2024-11-16 NOTE — Progress Notes (Signed)
 Aaron Leni Edyth DELENA, MD [address] [place] [address]  [facility] OFFICE PROGRESS NOTE  Aaron Leni Edyth DELENA, MD [address] [place] [address]  DIAGNOSIS:  1) History of rectal cancer in 2007 status post concurrent chemoradiation followed by surgery. 2) Chronic lymphocytic leukemia diagnosed in 2007.   PRIOR THERAPY: 1) Ibrutinib  420 mg p.o. daily first 2007 and discontinued August 26, 2023 secondary to disease progression. 2) Acalabrutinib  100 mg p.o. twice daily.  First dose started August 28, 2023.  This discontinued and August 2025 secondary to disease progression and rising total white blood count with anemia and thrombocytopenia.  CURRENT THERAPY:  Infusional obinutuzumab  in addition to venetoclax . First dose on 06/25/24. Venetoclax  started on 07/15/24. Status post 5 cycles.   INTERVAL HISTORY: Aaron Gonzalez 79 y.o. male returns to the clinic today for a follow-up visit. The patient was last seen in clinic by Dr. Sherrod on 10/18/24.   The patient was started on treatment for progressive CLL the week on 06/25/2024.  He is undergoing treatment with obinutuzumab .     He also started on treatment with venetoclax  on 07/15/2024.  Thus far he has been tolerating it without adverse side effects. He states he is taking this and confirmed the dose  He denies any abnormal bleeding or bruising. He denies any signs and symptoms of infection including fever, nasal congestion, sore throat, skin infections, burning with urination, dysuria, diarrhea, or abdominal pain. He denies any shortness of breath, night sweats, chest pain,  or lymphadenopathy.  He does have persistent lower extremity swelling. At the facility where he lives they do feed the residents a lot of salty food. He sometimes wears compression stockings. He is here today for evaluation and repeat blood work. He is here for his last/6th infusion of obinutuzumab .   MEDICAL HISTORY: Past Medical History:   Diagnosis Date   Arthritis    CLL (chronic lymphocytic leukemia) (HCC)    DVT (deep venous thrombosis) (HCC)    History of rectal cancer    Hypertension    Stroke (HCC)     ALLERGIES:  is allergic to gazyva  [obinutuzumab ].  MEDICATIONS:  Current Outpatient Medications  Medication Sig Dispense Refill   acetaminophen  (TYLENOL ) 325 MG tablet Take 2 tablets (650 mg total) by mouth every 6 (six) hours as needed for mild pain (pain score 1-3) or fever (or Fever >/= 101). 20 tablet 0   acyclovir  (ZOVIRAX ) 400 MG tablet Take 1 tablet (400 mg total) by mouth daily. 30 tablet 5   allopurinol  (ZYLOPRIM ) 300 MG tablet Take 1 tablet (300 mg total) by mouth daily. 30 tablet 0   amLODipine  (NORVASC ) 10 MG tablet Take 1 tablet by mouth daily.     blood glucose meter kit and supplies KIT Dispense based on patient and insurance preference. Use up to four times daily as directed. 1 each 0   carvedilol  (COREG ) 6.25 MG tablet TAKE 1 TABLET(6.25 MG) BY MOUTH TWICE DAILY 180 tablet 3   furosemide  (LASIX ) 20 MG tablet Take 20 mg by mouth daily as needed.     lidocaine -prilocaine  (EMLA ) cream Apply 1 Application topically as needed. 30 g 2   ondansetron  (ZOFRAN ) 8 MG tablet Take 1 tablet (8 mg total) by mouth every 8 (eight) hours as needed for nausea or vomiting. 30 tablet 1   prochlorperazine  (COMPAZINE ) 10 MG tablet Take 1 tablet (10 mg total) by mouth every 6 (six) hours as needed for nausea or vomiting. 30  tablet 1   senna-docusate (SENOKOT-S) 8.6-50 MG tablet Take 1 tablet by mouth at bedtime as needed for mild constipation. 30 tablet 0   venetoclax  (VENCLEXTA ) 100 MG tablet Take 2 tablets (200 mg total) by mouth daily. 60 tablet 0   No current facility-administered medications for this visit.    SURGICAL HISTORY:  Past Surgical History:  Procedure Laterality Date   COLECTOMY     with colostomy   IR IMAGING GUIDED PORT INSERTION  08/06/2024   TOTAL HIP ARTHROPLASTY Left 10/26/2021   Procedure: Left  TOTAL HIP ARTHROPLASTY ANTERIOR APPROACH;  Surgeon: Vernetta Lonni GRADE, MD;  Location: [facility];  Service: Orthopedics;  Laterality: Left;  RNFA    REVIEW OF SYSTEMS:   Review of Systems  Constitutional: Negative for appetite change, chills, fatigue, fever and unexpected weight change.  HENT: Negative for mouth sores, nosebleeds, sore throat and trouble swallowing.   Eyes: Negative for eye problems and icterus.  Respiratory: Negative for cough, hemoptysis, shortness of breath and wheezing.   Cardiovascular: Negative for chest pain. Positive for leg swelling.  Gastrointestinal: Negative for abdominal pain, constipation, diarrhea, nausea and vomiting.  Genitourinary: Negative for bladder incontinence, difficulty urinating, dysuria, frequency and hematuria.   Musculoskeletal: Negative for back pain, gait problem, neck pain and neck stiffness.  Skin: Negative for itching and rash.  Neurological: Negative for dizziness, extremity weakness, gait problem, headaches, light-headedness and seizures.  Hematological: Negative for adenopathy. Does not bruise/bleed easily.  Psychiatric/Behavioral: Negative for confusion, depression and sleep disturbance. The patient is not nervous/anxious.     PHYSICAL EXAMINATION:  There were no vitals taken for this visit.  ECOG PERFORMANCE STATUS: 1  Physical Exam  Constitutional: Oriented to person, place, and time and well-developed, well-nourished, and in no distress.  HENT:  Head: Normocephalic and atraumatic.  Mouth/Throat: Oropharynx is clear and moist. No oropharyngeal exudate.  Eyes: Conjunctivae are normal. Right eye exhibits no discharge. Left eye exhibits no discharge. No scleral icterus.  Neck: Normal range of motion. Neck supple.  Cardiovascular: Normal rate, regular rhythm, normal heart sounds and intact distal pulses.   Pulmonary/Chest: Effort normal and breath sounds normal. No respiratory distress. No wheezes. No rales.  Abdominal: Soft.  Bowel sounds are normal. Exhibits no distension and no mass. There is no tenderness.  Musculoskeletal: Normal range of motion. Positive for lower extremity edema.  Lymphadenopathy:    No cervical adenopathy.  Neurological: Alert and oriented to person, place, and time. Exhibits normal muscle tone. Gait normal. Coordination normal.  Skin: Skin is warm and dry. No rash noted. Not diaphoretic. No erythema. No pallor.  Psychiatric: Mood, memory and judgment normal.  Vitals reviewed.  LABORATORY DATA: Lab Results  Component Value Date   WBC 3.4 (L) 11/13/2024   HGB 14.1 11/13/2024   HCT 40.7 11/13/2024   MCV 96.7 11/13/2024   PLT 153 11/13/2024      Chemistry      Component Value Date/Time   NA 142 11/13/2024 0929   NA 139 06/28/2021 0954   K 3.8 11/13/2024 0929   CL 105 11/13/2024 0929   CO2 27 11/13/2024 0929   BUN 11 11/13/2024 0929   BUN 11 06/28/2021 0954   CREATININE 0.97 11/13/2024 0929      Component Value Date/Time   CALCIUM  9.6 11/13/2024 0929   ALKPHOS 65 11/13/2024 0929   AST 28 11/13/2024 0929   ALT 19 11/13/2024 0929   BILITOT 1.2 11/13/2024 0929       RADIOGRAPHIC STUDIES:  No results found.   ASSESSMENT/PLAN:  This is a very pleasant 80 year old African-American male with a history of rectal cancer in 2007. He is status post concurrent chemoradiation followed by surgical resection. At the same time he was diagnosed with chronic lymphocytic leukemia and he has been on observation initially for several years which was started on treatment with ibrutinib  420 mg in 2019.    He was found to have evidence of disease progression in early November 2024. Therefore he was started on acalabrutinib  around 08/28/2023.  This was discontinued in August 2025 due to disease progression.   Therefore Dr. Sherrod started the patient on treatment with venetoclax  and Obinutuzumab .  His first ifusion was on 06/25/24. He started venetoclax  around 07/15/24.  He is currently on 200 mg  of venetoclax    Labs were reviewed. His WBC is 3.4. His Hbg is 14.1. His platelet count is 153k.    He will continue on the same treatment at the same dose.    This is his last infusion of Obinutuzumab .   Moving forward he will just continue on venetoclax  alone   Peripheral edema - Advise wearing compression stockings during the day. - Advised to elevate  -Advised to avoid salty foods.    We will see him back for labs and follow up in 4 weeks.   I discussed the assessment and treatment plan with the patient. The patient was provided an opportunity to ask questions and all were answered. The patient agreed with the plan and demonstrated an understanding of the instructions.  The patient was advised to call back or seek an in-person evaluation if the symptoms worsen or if the condition fails to improve as anticipated.  I provided 20 minutes of face to face contact, and > 50% was spent counseling as documented under my assessment & plan.  Raphaella Larkin L Klohe Lovering, PA-C 11/16/2024 10:23 AM  No orders of the defined types were placed in this encounter.

## 2024-11-19 ENCOUNTER — Other Ambulatory Visit: Payer: Self-pay

## 2024-11-25 ENCOUNTER — Other Ambulatory Visit: Payer: Self-pay | Admitting: Physician Assistant

## 2024-11-25 ENCOUNTER — Encounter: Payer: Self-pay | Admitting: Internal Medicine

## 2024-11-25 ENCOUNTER — Other Ambulatory Visit: Payer: Self-pay

## 2024-11-25 DIAGNOSIS — C9110 Chronic lymphocytic leukemia of B-cell type not having achieved remission: Secondary | ICD-10-CM

## 2024-11-25 MED ORDER — VENETOCLAX 100 MG PO TABS
200.0000 mg | ORAL_TABLET | Freq: Every day | ORAL | 0 refills | Status: AC
  Filled 2024-11-25: qty 84, 42d supply, fill #0
  Filled 2024-11-26: qty 56, 28d supply, fill #0

## 2024-11-26 ENCOUNTER — Other Ambulatory Visit: Payer: Self-pay

## 2024-12-07 ENCOUNTER — Ambulatory Visit: Admitting: Podiatry

## 2024-12-14 ENCOUNTER — Inpatient Hospital Stay: Admitting: Physician Assistant

## 2024-12-14 ENCOUNTER — Inpatient Hospital Stay: Attending: Internal Medicine

## 2025-01-11 ENCOUNTER — Inpatient Hospital Stay: Admitting: Internal Medicine

## 2025-01-11 ENCOUNTER — Inpatient Hospital Stay: Attending: Internal Medicine
